# Patient Record
Sex: Female | Born: 1945 | Race: Black or African American | Hispanic: No | Smoking: Never smoker
Health system: Southern US, Community
[De-identification: ages and names within clinical notes are randomized; demographics above are authoritative.]

## PROBLEM LIST (undated history)

## (undated) DIAGNOSIS — T8859XA Other complications of anesthesia, initial encounter: Secondary | ICD-10-CM

## (undated) DIAGNOSIS — T4145XA Adverse effect of unspecified anesthetic, initial encounter: Secondary | ICD-10-CM

## (undated) DIAGNOSIS — M069 Rheumatoid arthritis, unspecified: Secondary | ICD-10-CM

## (undated) DIAGNOSIS — I2699 Other pulmonary embolism without acute cor pulmonale: Secondary | ICD-10-CM

## (undated) DIAGNOSIS — K219 Gastro-esophageal reflux disease without esophagitis: Secondary | ICD-10-CM

## (undated) DIAGNOSIS — M109 Gout, unspecified: Secondary | ICD-10-CM

## (undated) DIAGNOSIS — D649 Anemia, unspecified: Secondary | ICD-10-CM

## (undated) DIAGNOSIS — G894 Chronic pain syndrome: Secondary | ICD-10-CM

## (undated) DIAGNOSIS — M5030 Other cervical disc degeneration, unspecified cervical region: Secondary | ICD-10-CM

## (undated) DIAGNOSIS — F419 Anxiety disorder, unspecified: Secondary | ICD-10-CM

## (undated) DIAGNOSIS — E559 Vitamin D deficiency, unspecified: Secondary | ICD-10-CM

## (undated) DIAGNOSIS — M797 Fibromyalgia: Secondary | ICD-10-CM

## (undated) DIAGNOSIS — N183 Chronic kidney disease, stage 3 unspecified: Secondary | ICD-10-CM

## (undated) DIAGNOSIS — E538 Deficiency of other specified B group vitamins: Secondary | ICD-10-CM

## (undated) DIAGNOSIS — H9190 Unspecified hearing loss, unspecified ear: Secondary | ICD-10-CM

## (undated) DIAGNOSIS — M1990 Unspecified osteoarthritis, unspecified site: Secondary | ICD-10-CM

## (undated) DIAGNOSIS — I10 Essential (primary) hypertension: Secondary | ICD-10-CM

## (undated) HISTORY — PX: KNEE ARTHROSCOPY: SUR90

## (undated) HISTORY — PX: CHOLECYSTECTOMY: SHX55

## (undated) HISTORY — PX: ABDOMINAL HYSTERECTOMY: SUR658

## (undated) HISTORY — PX: ABDOMINAL HYSTERECTOMY: SHX81

## (undated) HISTORY — PX: TONSILLECTOMY: SUR1361

## (undated) HISTORY — PX: TUBAL LIGATION: SHX77

---
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
## 1985-07-29 DIAGNOSIS — Z87442 Personal history of urinary calculi: Secondary | ICD-10-CM

## 1985-07-29 HISTORY — DX: Personal history of urinary calculi: Z87.442

## 1987-07-30 HISTORY — PX: ANKLE FRACTURE SURGERY: SHX122

## 2013-07-29 HISTORY — PX: EYE SURGERY: SHX253

## 2016-01-12 ENCOUNTER — Ambulatory Visit
Admission: EM | Admit: 2016-01-12 | Discharge: 2016-01-12 | Disposition: A | Discharge status: Home / Self Care | Payer: Medicare Other | Attending: Family Medicine | Admitting: Family Medicine

## 2016-01-12 ENCOUNTER — Encounter: Payer: Self-pay | Admitting: *Deleted

## 2016-01-12 DIAGNOSIS — M791 Myalgia, unspecified site: Secondary | ICD-10-CM

## 2016-01-12 DIAGNOSIS — M2550 Pain in unspecified joint: Secondary | ICD-10-CM | POA: Diagnosis not present

## 2016-01-12 HISTORY — DX: Unspecified osteoarthritis, unspecified site: M19.90

## 2016-01-12 HISTORY — DX: Essential (primary) hypertension: I10

## 2016-01-12 MED ORDER — HYDROCODONE-ACETAMINOPHEN 5-325 MG PO TABS: ORAL_TABLET | ORAL | Status: DC

## 2016-01-12 NOTE — ED Notes (Signed)
Pt has hx of Gout, states she is having a flare up and hurts "all over".

## 2016-02-23 NOTE — ED Provider Notes (Signed)
[facility]    CSN: [id number] Arrival date & time: 01/12/16  1849  First Provider Contact:  First MD Initiated Contact with Patient 01/12/16 1946        History   Chief Complaint Chief Complaint  Patient presents with  . Gout    HPI Ahriyah Hartkopf is a 70 y.o. female.   70 yo female with a c/o "hurting all over my muscles and joints". Denies any fevers, chills, falls, injuries, swelling, new medications.    The history is provided by the patient.    Past Medical History:  Diagnosis Date  . Arthritis   . Hypertension     There are no active problems to display for this patient.   Past Surgical History:  Procedure Laterality Date  . ABDOMINAL HYSTERECTOMY    . CESAREAN SECTION    . CHOLECYSTECTOMY    . KNEE ARTHROSCOPY    . TONSILLECTOMY    . TUBAL LIGATION      OB History    No data available       Home Medications    Prior to Admission medications   Medication Sig Start Date End Date Taking? Authorizing Provider  allopurinol (ZYLOPRIM) 300 MG tablet Take 300 mg by mouth daily.   Yes Historical Provider, MD  cholecalciferol (VITAMIN D) 400 units TABS tablet Take 400 Units by mouth.   Yes Historical Provider, MD  estradiol (ESTRACE) 0.5 MG tablet Take 0.5 mg by mouth daily.   Yes Historical Provider, MD  furosemide (LASIX) 10 MG/ML solution Take by mouth daily.   Yes Historical Provider, MD  losartan (COZAAR) 100 MG tablet Take 100 mg by mouth daily.   Yes Historical Provider, MD  methotrexate (RHEUMATREX) 2.5 MG tablet Take 7.5 mg by mouth 3 (three) times a week.   Yes Historical Provider, MD  potassium chloride (KLOR-CON) 20 MEQ packet Take by mouth 2 (two) times daily.   Yes Historical Provider, MD  traMADol (ULTRAM-ER) 100 MG 24 hr tablet Take 100 mg by mouth daily.   Yes Historical Provider, MD  HYDROcodone-acetaminophen (NORCO/VICODIN) 5-325 MG tablet 1-2 tabs po 8 hours prn 01/12/16   Norval Gable, MD    Family History History  reviewed. No pertinent family history.  Social History Social History  Substance Use Topics  . Smoking status: Never Smoker  . Smokeless tobacco: Not on file  . Alcohol use No     Allergies   Penicillins   Review of Systems Review of Systems   Physical Exam Triage Vital Signs ED Triage Vitals  Enc Vitals Group     BP 01/12/16 1903 (!) 130/49     Pulse Rate 01/12/16 1903 84     Resp 01/12/16 1903 16     Temp 01/12/16 1903 98 F (36.7 C)     Temp Source 01/12/16 1903 Oral     SpO2 01/12/16 1903 100 %     Weight 01/12/16 1903 230 lb (104.3 kg)     Height 01/12/16 1903 5\' 5"  (1.651 m)     Head Circumference --      Peak Flow --      Pain Score 01/12/16 2002 5     Pain Loc --      Pain Edu? --      Excl. in [place]? --    No data found.   Updated Vital Signs BP (!) 130/49 (BP Location: Left Arm)   Pulse 84   Temp 98 F (36.7 C) (Oral)  Resp 16   Ht 5\' 5"  (1.651 m)   Wt 230 lb (104.3 kg)   SpO2 100%   BMI 38.27 kg/m   Visual Acuity Right Eye Distance:   Left Eye Distance:   Bilateral Distance:    Right Eye Near:   Left Eye Near:    Bilateral Near:     Physical Exam  Constitutional: She appears well-developed and well-nourished. No distress.  Musculoskeletal: She exhibits no edema or tenderness.  Skin: She is not diaphoretic.  Nursing note and vitals reviewed.    UC Treatments / Results  Labs (all labs ordered are listed, but only abnormal results are displayed) Labs Reviewed - No data to display  EKG  EKG Interpretation None       Radiology No results found.  Procedures Procedures (including critical care time)  Medications Ordered in UC Medications - No data to display   Initial Impression / Assessment and Plan / UC Course  I have reviewed the triage vital signs and the nursing notes.  Pertinent labs & imaging results that were available during my care of the patient were reviewed by me and considered in my medical decision making  (see chart for details).  Clinical Course      Final Clinical Impressions(s) / UC Diagnoses   Final diagnoses:  Myalgia  Arthralgia    New Prescriptions Discharge Medication List as of 01/12/2016  8:00 PM    START taking these medications   Details  HYDROcodone-acetaminophen (NORCO/VICODIN) 5-325 MG tablet 1-2 tabs po 8 hours prn, Print       1.  diagnosis reviewed with patient/parent/guardian/family 2. rx as per orders above; reviewed possible side effects, interactions, risks and benefits  3. Follow-up prn if symptoms worsen or don't improve   Norval Gable, MD 02/23/16 1301

## 2016-02-26 DIAGNOSIS — K589 Irritable bowel syndrome without diarrhea: Secondary | ICD-10-CM | POA: Insufficient documentation

## 2016-02-26 DIAGNOSIS — J45990 Exercise induced bronchospasm: Secondary | ICD-10-CM | POA: Insufficient documentation

## 2016-02-26 DIAGNOSIS — M797 Fibromyalgia: Secondary | ICD-10-CM | POA: Insufficient documentation

## 2016-02-26 DIAGNOSIS — E538 Deficiency of other specified B group vitamins: Secondary | ICD-10-CM | POA: Insufficient documentation

## 2016-02-26 DIAGNOSIS — M069 Rheumatoid arthritis, unspecified: Secondary | ICD-10-CM | POA: Insufficient documentation

## 2016-02-26 DIAGNOSIS — I10 Essential (primary) hypertension: Secondary | ICD-10-CM | POA: Insufficient documentation

## 2016-02-26 DIAGNOSIS — E559 Vitamin D deficiency, unspecified: Secondary | ICD-10-CM | POA: Insufficient documentation

## 2016-02-26 DIAGNOSIS — K219 Gastro-esophageal reflux disease without esophagitis: Secondary | ICD-10-CM | POA: Insufficient documentation

## 2016-02-26 DIAGNOSIS — M1A09X0 Idiopathic chronic gout, multiple sites, without tophus (tophi): Secondary | ICD-10-CM | POA: Insufficient documentation

## 2016-03-06 DIAGNOSIS — Z79899 Other long term (current) drug therapy: Secondary | ICD-10-CM | POA: Insufficient documentation

## 2016-03-06 DIAGNOSIS — Z79891 Long term (current) use of opiate analgesic: Secondary | ICD-10-CM | POA: Insufficient documentation

## 2016-05-17 ENCOUNTER — Ambulatory Visit
Admission: EM | Admit: 2016-05-17 | Discharge: 2016-05-17 | Disposition: A | Discharge status: Home / Self Care | Payer: Medicare Other | Attending: Family Medicine | Admitting: Family Medicine

## 2016-05-17 ENCOUNTER — Encounter: Payer: Self-pay | Admitting: Emergency Medicine

## 2016-05-17 DIAGNOSIS — M129 Arthropathy, unspecified: Secondary | ICD-10-CM

## 2016-05-17 DIAGNOSIS — M25511 Pain in right shoulder: Secondary | ICD-10-CM

## 2016-05-17 MED ORDER — PREDNISONE 20 MG PO TABS: ORAL_TABLET | ORAL | 0 refills | Status: DC

## 2016-05-17 NOTE — ED Provider Notes (Signed)
CSN: [id number]     Arrival date & time 05/17/16  1818 History   First MD Initiated Contact with Patient 05/17/16 1939     Chief Complaint  Patient presents with  . Arm Pain    right   (Consider location/radiation/quality/duration/timing/severity/associated sxs/prior Treatment) HPI  This a 70 year old female who presents with planes of right shoulder pain with any motion away from her side and aching "all over". The patient has just returned from a long trip to [facility] in [place] head or she walked excessively. He definitely overdid her usual activities. Now she has nonspecific pain in her joints. She been in bed all day long tried tramadol and a Vicodin without any relief. She has a history of rheumatoid arthritis and is currently taking methotrexate milligrams IM weekly. In addition she has a history of gouty arthritis and has been taking allopurinol.      Past Medical History:  Diagnosis Date  . Arthritis   . Hypertension    Past Surgical History:  Procedure Laterality Date  . ABDOMINAL HYSTERECTOMY    . CESAREAN SECTION    . CHOLECYSTECTOMY    . KNEE ARTHROSCOPY    . TONSILLECTOMY    . TUBAL LIGATION     History reviewed. No pertinent family history. Social History  Substance Use Topics  . Smoking status: Never Smoker  . Smokeless tobacco: Never Used  . Alcohol use No   OB History    No data available     Review of Systems  Constitutional: Positive for activity change. Negative for chills, fatigue and fever.  Musculoskeletal: Positive for arthralgias and myalgias.  All other systems reviewed and are negative.   Allergies  Penicillins  Home Medications   Prior to Admission medications   Medication Sig Start Date End Date Taking? Authorizing Provider  allopurinol (ZYLOPRIM) 300 MG tablet Take 300 mg by mouth daily.    Historical Provider, MD  cholecalciferol (VITAMIN D) 400 units TABS tablet Take 400 Units by mouth.    Historical Provider, MD  estradiol  (ESTRACE) 0.5 MG tablet Take 0.5 mg by mouth daily.    Historical Provider, MD  furosemide (LASIX) 10 MG/ML solution Take by mouth daily.    Historical Provider, MD  losartan (COZAAR) 100 MG tablet Take 100 mg by mouth daily.    Historical Provider, MD  methotrexate (RHEUMATREX) 2.5 MG tablet Take 7.5 mg by mouth 3 (three) times a week.    Historical Provider, MD  potassium chloride (KLOR-CON) 20 MEQ packet Take by mouth 2 (two) times daily.    Historical Provider, MD  predniSONE (DELTASONE) 20 MG tablet Take 1 tablet daily for 3 days 05/17/16   Lorin Picket, PA-C  traMADol (ULTRAM-ER) 100 MG 24 hr tablet Take 100 mg by mouth daily.    Historical Provider, MD   Meds Ordered and Administered this Visit  Medications - No data to display  BP (!) 143/63 (BP Location: Right Arm)   Pulse 78   Temp 97.4 F (36.3 C) (Tympanic)   Resp 16   Ht 5\' 5"  (1.651 m)   Wt 232 lb (105.2 kg)   SpO2 100%   BMI 38.61 kg/m  No data found.   Physical Exam  Constitutional: She is oriented to person, place, and time. She appears well-developed and well-nourished. No distress.  HENT:  Head: Normocephalic and atraumatic.  Eyes: EOM are normal. Pupils are equal, round, and reactive to light.  Neck: Normal range of motion. Neck supple.  Musculoskeletal:  She exhibits tenderness.  Examination of the right shoulder shows good passive range of motion lacking approximately 10-15 of full duction to 90. He has good internal and external rotation. There is no evidence of any gout present in her joints.  Neurological: She is alert and oriented to person, place, and time.  Skin: Skin is warm and dry. She is not diaphoretic.  Psychiatric: She has a normal mood and affect. Her behavior is normal. Judgment and thought content normal.  Nursing note and vitals reviewed.   Urgent Care Course   Clinical Course    Procedures (including critical care time)  Labs Review Labs Reviewed - No data to  display  Imaging Review No results found.   Visual Acuity Review  Right Eye Distance:   Left Eye Distance:   Bilateral Distance:    Right Eye Near:   Left Eye Near:    Bilateral Near:         MDM   1. Arthritis involving multiple sites   2. Acute pain of right shoulder    Discharge Medication List as of 05/17/2016  8:03 PM    START taking these medications   Details  predniSONE (DELTASONE) 20 MG tablet Take 1 tablet daily for 3 days, Normal      Plan: 1. Test/x-ray results and diagnosis reviewed with patient 2. rx as per orders; risks, benefits, potential side effects reviewed with patient 3. Recommend supportive treatment with Rest and symptom avoidance. She asked for a cortisone shot in the shoulder and I told her that we do not perform that in this facility. Need to follow-up with her rheumatologist and I recommended that she tried to see the rheumatologist earlier than November 9 appointment that she has. Do not want to prescribe any nonsteroidal anti-inflammatory medications because she is on methotrexate but I will prescribe a very limited amount of low dose prednisone to see if this will help her. I have told her she must rely mostly on rest and avoidance of symptoms. She did far too much on her vacation. She also tells me that she [place] and I have warned her not to do walking and would prefer that she go in a wheelchair and have her daughter pushed her around. 4. F/u prn if symptoms worsen or don't improve     Lorin Picket, PA-C 05/17/16 2017

## 2016-05-17 NOTE — ED Triage Notes (Signed)
Patient c/o pain and weakness in her right arm and pain all over body that started this week.

## 2016-05-21 ENCOUNTER — Telehealth: Payer: Self-pay

## 2016-05-21 NOTE — Telephone Encounter (Signed)
Courtesy call back completed today for patient's recent visit at [facility]. Patient did not answer, left message on machine to call back with any questions or concerns.   

## 2016-06-06 DIAGNOSIS — M7551 Bursitis of right shoulder: Secondary | ICD-10-CM | POA: Insufficient documentation

## 2016-08-06 ENCOUNTER — Ambulatory Visit: Payer: BLUE CROSS/BLUE SHIELD | Admitting: Pain Medicine

## 2016-09-13 ENCOUNTER — Other Ambulatory Visit: Payer: Self-pay | Admitting: Family Medicine

## 2016-09-13 DIAGNOSIS — Z1239 Encounter for other screening for malignant neoplasm of breast: Secondary | ICD-10-CM

## 2016-10-13 ENCOUNTER — Ambulatory Visit
Admission: EM | Admit: 2016-10-13 | Discharge: 2016-10-13 | Disposition: A | Discharge status: Home / Self Care | Payer: Medicare Other | Attending: Family Medicine | Admitting: Family Medicine

## 2016-10-13 DIAGNOSIS — J209 Acute bronchitis, unspecified: Secondary | ICD-10-CM | POA: Diagnosis not present

## 2016-10-13 MED ORDER — HYDROCOD POLST-CPM POLST ER 10-8 MG/5ML PO SUER: 5.0000 mL | Freq: Two times a day (BID) | ORAL | 0 refills | Status: DC | PRN

## 2016-10-13 MED ORDER — PREDNISONE 50 MG PO TABS: ORAL_TABLET | ORAL | 0 refills | Status: DC

## 2016-10-13 NOTE — ED Triage Notes (Signed)
Pt with 2 weeks of nasal drainage and cough, chest hurts with coughing, and head pain. Pain 4/10.

## 2016-10-13 NOTE — Discharge Instructions (Signed)
This is likely viral. No antibiotics needed.  Prednisone will help as well as cough medication.  Take care  Dr. Lacinda Axon

## 2016-10-13 NOTE — ED Provider Notes (Signed)
[facility]    CSN: [id number] Arrival date & time: 10/13/16  1047   History   Chief Complaint Chief Complaint  Patient presents with  . Nasal Congestion  . Cough   HPI  71 year old female presents with the above complaints.  Patient states she's been sick for 2 weeks. She's had sinus pressure and headache. She said sinus drainage and cough. She is particularly bothered by her cough currently. Cardizem productive of thick sputum. No purulence. She had a fever last week but has had none recently. No other associated symptoms. No known exacerbating or relieving factors. No other complaints or concerns at this time.  Past Medical History:  Diagnosis Date  . Arthritis   . Hypertension    There are no active problems to display for this patient.  Past Surgical History:  Procedure Laterality Date  . ABDOMINAL HYSTERECTOMY    . CESAREAN SECTION    . CHOLECYSTECTOMY    . KNEE ARTHROSCOPY    . TONSILLECTOMY    . TUBAL LIGATION     OB History    No data available     Home Medications    Prior to Admission medications   Medication Sig Start Date End Date Taking? Authorizing Provider  gabapentin (NEURONTIN) 300 MG capsule Take 300 mg by mouth 3 (three) times daily.   Yes Historical Provider, MD  allopurinol (ZYLOPRIM) 300 MG tablet Take 300 mg by mouth daily.    Historical Provider, MD  chlorpheniramine-HYDROcodone (TUSSIONEX PENNKINETIC ER) 10-8 MG/5ML SUER Take 5 mLs by mouth every 12 (twelve) hours as needed. 10/13/16   Coral Spikes, DO  cholecalciferol (VITAMIN D) 400 units TABS tablet Take 400 Units by mouth.    Historical Provider, MD  estradiol (ESTRACE) 0.5 MG tablet Take 0.5 mg by mouth daily.    Historical Provider, MD  furosemide (LASIX) 10 MG/ML solution Take by mouth daily.    Historical Provider, MD  losartan (COZAAR) 100 MG tablet Take 100 mg by mouth daily.    Historical Provider, MD  methotrexate (RHEUMATREX) 2.5 MG tablet Take 7.5 mg by mouth 3  (three) times a week.    Historical Provider, MD  potassium chloride (KLOR-CON) 20 MEQ packet Take by mouth 2 (two) times daily.    Historical Provider, MD  predniSONE (DELTASONE) 50 MG tablet 1 tablet daily x 5 days. 10/13/16   Coral Spikes, DO  traMADol (ULTRAM-ER) 100 MG 24 hr tablet Take 100 mg by mouth daily.    Historical Provider, MD    Family History History reviewed. No pertinent family history.  Social History Social History  Substance Use Topics  . Smoking status: Never Smoker  . Smokeless tobacco: Never Used  . Alcohol use No   Allergies   Penicillins   Review of Systems Review of Systems  Constitutional: Positive for fever.  HENT: Positive for postnasal drip and sinus pressure.   Respiratory: Positive for cough.    Physical Exam Triage Vital Signs ED Triage Vitals  Enc Vitals Group     BP 10/13/16 1115 (!) 134/57     Pulse Rate 10/13/16 1115 77     Resp 10/13/16 1115 20     Temp 10/13/16 1115 98.7 F (37.1 C)     Temp Source 10/13/16 1115 Oral     SpO2 10/13/16 1115 100 %     Weight 10/13/16 1115 237 lb (107.5 kg)     Height 10/13/16 1115 5\' 4"  (1.626 m)  Head Circumference --      Peak Flow --      Pain Score 10/13/16 1117 4     Pain Loc --      Pain Edu? --      Excl. in [place]? --    No data found.   Updated Vital Signs BP (!) 134/57 (BP Location: Left Arm)   Pulse 77   Temp 98.7 F (37.1 C) (Oral)   Resp 20   Ht 5\' 4"  (1.626 m)   Wt 237 lb (107.5 kg)   SpO2 100%   BMI 40.68 kg/m   Physical Exam  Constitutional: She is oriented to person, place, and time. She appears well-developed. No distress.  HENT:  Head: Normocephalic and atraumatic.  Mouth/Throat: Oropharynx is clear and moist.  Mild maxillary sinus tenderness to palpation.  Neck: Neck supple.  Cardiovascular: Normal rate and regular rhythm.   Pulmonary/Chest: Effort normal and breath sounds normal.  Lymphadenopathy:    She has no cervical adenopathy.  Neurological: She is  alert and oriented to person, place, and time.  Psychiatric: She has a normal mood and affect.  Vitals reviewed.  UC Treatments / Results  Labs (all labs ordered are listed, but only abnormal results are displayed) Labs Reviewed - No data to display  EKG  EKG Interpretation None       Radiology No results found.  Procedures Procedures (including critical care time)  Medications Ordered in UC Medications - No data to display   Initial Impression / Assessment and Plan / UC Course  I have reviewed the triage vital signs and the nursing notes.  Pertinent labs & imaging results that were available during my care of the patient were reviewed by me and considered in my medical decision making (see chart for details).   71 year old female presents with clinical picture consistent with viral bronchitis. Treating with prednisone and Tussionex.  Final Clinical Impressions(s) / UC Diagnoses   Final diagnoses:  Acute bronchitis, unspecified organism    New Prescriptions Discharge Medication List as of 10/13/2016 12:28 PM    START taking these medications   Details  chlorpheniramine-HYDROcodone (TUSSIONEX PENNKINETIC ER) 10-8 MG/5ML SUER Take 5 mLs by mouth every 12 (twelve) hours as needed., Starting Sun 10/13/2016, [place], DO 10/13/16 1240

## 2016-10-14 ENCOUNTER — Ambulatory Visit
Admission: RE | Admit: 2016-10-14 | Discharge: 2016-10-14 | Disposition: A | Discharge status: Home / Self Care | Payer: Medicare Other | Source: Ambulatory Visit | Attending: Family Medicine | Admitting: Family Medicine

## 2016-10-14 DIAGNOSIS — Z1231 Encounter for screening mammogram for malignant neoplasm of breast: Secondary | ICD-10-CM | POA: Insufficient documentation

## 2016-10-14 DIAGNOSIS — Z1239 Encounter for other screening for malignant neoplasm of breast: Secondary | ICD-10-CM

## 2016-10-22 ENCOUNTER — Inpatient Hospital Stay
Admission: RE | Admit: 2016-10-22 | Discharge: 2016-10-22 | Disposition: A | Discharge status: Home / Self Care | Payer: Self-pay | Source: Ambulatory Visit | Attending: *Deleted | Admitting: *Deleted

## 2016-10-22 ENCOUNTER — Other Ambulatory Visit: Payer: Self-pay | Admitting: *Deleted

## 2016-10-22 DIAGNOSIS — Z9289 Personal history of other medical treatment: Secondary | ICD-10-CM

## 2016-12-26 DIAGNOSIS — M1712 Unilateral primary osteoarthritis, left knee: Secondary | ICD-10-CM | POA: Insufficient documentation

## 2017-02-12 DIAGNOSIS — R700 Elevated erythrocyte sedimentation rate: Secondary | ICD-10-CM | POA: Insufficient documentation

## 2017-03-24 DIAGNOSIS — Z6841 Body Mass Index (BMI) 40.0 and over, adult: Secondary | ICD-10-CM | POA: Insufficient documentation

## 2017-03-28 DIAGNOSIS — N183 Chronic kidney disease, stage 3 unspecified: Secondary | ICD-10-CM | POA: Insufficient documentation

## 2017-04-07 ENCOUNTER — Encounter: Payer: Self-pay | Admitting: *Deleted

## 2017-04-07 ENCOUNTER — Ambulatory Visit
Admission: EM | Admit: 2017-04-07 | Discharge: 2017-04-07 | Disposition: A | Discharge status: Home / Self Care | Payer: Medicare Other | Attending: Family Medicine | Admitting: Family Medicine

## 2017-04-07 DIAGNOSIS — K120 Recurrent oral aphthae: Secondary | ICD-10-CM

## 2017-04-07 MED ORDER — LIDOCAINE VISCOUS 2 % MT SOLN: 20.0000 mL | OROMUCOSAL | 0 refills | Status: DC | PRN

## 2017-04-07 NOTE — Discharge Instructions (Signed)
Use regular mouthwash (ACT, Peroxyl) two or three times daily

## 2017-04-07 NOTE — ED Provider Notes (Signed)
[facility]    CSN: [id number] Arrival date & time: 04/07/17  1609     History   Chief Complaint Chief Complaint  Patient presents with  . Mouth Lesions    HPI Sabrina Green is a 71 y.o. female.   71 yo female with a c/o painful sore in her mouth for the last 3 days. Denies any injuries, fevers, chills.    The history is provided by the patient.  Mouth Lesions    Past Medical History:  Diagnosis Date  . Arthritis   . Hypertension     There are no active problems to display for this patient.   Past Surgical History:  Procedure Laterality Date  . ABDOMINAL HYSTERECTOMY    . ABDOMINAL HYSTERECTOMY    . CESAREAN SECTION    . CHOLECYSTECTOMY    . KNEE ARTHROSCOPY    . TONSILLECTOMY    . TUBAL LIGATION      OB History    No data available       Home Medications    Prior to Admission medications   Medication Sig Start Date End Date Taking? Authorizing Provider  allopurinol (ZYLOPRIM) 300 MG tablet Take 300 mg by mouth daily.   Yes [provider]  cholecalciferol (VITAMIN D) 400 units TABS tablet Take 400 Units by mouth.   Yes [provider]  estradiol (ESTRACE) 0.5 MG tablet Take 0.5 mg by mouth daily.   Yes [provider]  furosemide (LASIX) 10 MG/ML solution Take by mouth daily.   Yes [provider]  gabapentin (NEURONTIN) 300 MG capsule Take 300 mg by mouth 3 (three) times daily.   Yes [provider]  losartan (COZAAR) 100 MG tablet Take 100 mg by mouth daily.   Yes [provider]  methotrexate (RHEUMATREX) 2.5 MG tablet Take 7.5 mg by mouth 3 (three) times a week.   Yes [provider]  potassium chloride (KLOR-CON) 20 MEQ packet Take by mouth 2 (two) times daily.   Yes [provider]  chlorpheniramine-HYDROcodone (TUSSIONEX PENNKINETIC ER) 10-8 MG/5ML SUER Take 5 mLs by mouth every 12 (twelve) hours as needed. 10/13/16   Coral Spikes, DO  lidocaine (XYLOCAINE) 2  % solution Use as directed 20 mLs in the mouth or throat as needed for mouth pain. 04/07/17   Norval Gable, MD  predniSONE (DELTASONE) 50 MG tablet 1 tablet daily x 5 days. 10/13/16   Coral Spikes, DO  traMADol (ULTRAM-ER) 100 MG 24 hr tablet Take 100 mg by mouth daily.    [provider]    Family History Family History  Problem Relation Age of Onset  . Breast cancer Other 93    Social History Social History  Substance Use Topics  . Smoking status: Never Smoker  . Smokeless tobacco: Never Used  . Alcohol use No     Allergies   Penicillins   Review of Systems Review of Systems  HENT: Positive for mouth sores.      Physical Exam Triage Vital Signs ED Triage Vitals  Enc Vitals Group     BP 04/07/17 1650 (!) 132/55     Pulse Rate 04/07/17 1650 64     Resp 04/07/17 1650 16     Temp 04/07/17 1650 98.6 F (37 C)     Temp Source 04/07/17 1650 Oral     SpO2 04/07/17 1650 100 %     Weight 04/07/17 1652 248 lb (112.5 kg)     Height  04/07/17 1652 5\' 5"  (1.651 m)     Head Circumference --      Peak Flow --      Pain Score --      Pain Loc --      Pain Edu? --      Excl. in [place]? --    No data found.   Updated Vital Signs BP (!) 132/55 (BP Location: Left Arm)   Pulse 64   Temp 98.6 F (37 C) (Oral)   Resp 16   Ht 5\' 5"  (1.651 m)   Wt 248 lb (112.5 kg)   SpO2 100%   BMI 41.27 kg/m   Visual Acuity Right Eye Distance:   Left Eye Distance:   Bilateral Distance:    Right Eye Near:   Left Eye Near:    Bilateral Near:     Physical Exam  Constitutional: She appears well-developed and well-nourished. No distress.  HENT:  Mouth/Throat: Oral lesions (2-40mm ulcerative lesions on inner cheek, lips area) present.  Skin: She is not diaphoretic.  Nursing note and vitals reviewed.    UC Treatments / Results  Labs (all labs ordered are listed, but only abnormal results are displayed) Labs Reviewed - No data to display  EKG  EKG Interpretation None         Radiology No results found.  Procedures Procedures (including critical care time)  Medications Ordered in UC Medications - No data to display   Initial Impression / Assessment and Plan / UC Course  I have reviewed the triage vital signs and the nursing notes.  Pertinent labs & imaging results that were available during my care of the patient were reviewed by me and considered in my medical decision making (see chart for details).      Final Clinical Impressions(s) / UC Diagnoses   Final diagnoses:  Aphthous ulcer    New Prescriptions Discharge Medication List as of 04/07/2017  5:39 PM    START taking these medications   Details  lidocaine (XYLOCAINE) 2 % solution Use as directed 20 mLs in the mouth or throat as needed for mouth pain., Starting Mon 04/07/2017, Normal       1. diagnosis reviewed with patient 2. rx as per orders above; reviewed possible side effects, interactions, risks and benefits  3. Recommend supportive treatment with mouthwash rinses  4. Follow-up prn if symptoms worsen or don't improve  Controlled Substance Prescriptions [place] Controlled Substance Registry consulted? Not Applicable   Norval Gable, MD 04/07/17 [phone]

## 2017-04-07 NOTE — ED Triage Notes (Signed)
Oral lesions, onset Saturday.

## 2017-04-22 DIAGNOSIS — M545 Low back pain, unspecified: Secondary | ICD-10-CM | POA: Insufficient documentation

## 2017-04-22 DIAGNOSIS — G8929 Other chronic pain: Secondary | ICD-10-CM | POA: Insufficient documentation

## 2017-06-02 DIAGNOSIS — M5136 Other intervertebral disc degeneration, lumbar region: Secondary | ICD-10-CM | POA: Insufficient documentation

## 2017-09-07 ENCOUNTER — Emergency Department: Payer: Medicare Other

## 2017-09-07 ENCOUNTER — Emergency Department
Admission: EM | Admit: 2017-09-07 | Discharge: 2017-09-07 | Disposition: A | Discharge status: Home / Self Care | Payer: Medicare Other | Attending: Emergency Medicine | Admitting: Emergency Medicine

## 2017-09-07 ENCOUNTER — Encounter: Payer: Self-pay | Admitting: Emergency Medicine

## 2017-09-07 DIAGNOSIS — Y999 Unspecified external cause status: Secondary | ICD-10-CM | POA: Diagnosis not present

## 2017-09-07 DIAGNOSIS — Y929 Unspecified place or not applicable: Secondary | ICD-10-CM | POA: Diagnosis not present

## 2017-09-07 DIAGNOSIS — I10 Essential (primary) hypertension: Secondary | ICD-10-CM | POA: Diagnosis not present

## 2017-09-07 DIAGNOSIS — Y9301 Activity, walking, marching and hiking: Secondary | ICD-10-CM | POA: Insufficient documentation

## 2017-09-07 DIAGNOSIS — J111 Influenza due to unidentified influenza virus with other respiratory manifestations: Secondary | ICD-10-CM

## 2017-09-07 DIAGNOSIS — R42 Dizziness and giddiness: Secondary | ICD-10-CM | POA: Diagnosis present

## 2017-09-07 DIAGNOSIS — Z79899 Other long term (current) drug therapy: Secondary | ICD-10-CM | POA: Diagnosis not present

## 2017-09-07 DIAGNOSIS — W109XXA Fall (on) (from) unspecified stairs and steps, initial encounter: Secondary | ICD-10-CM | POA: Insufficient documentation

## 2017-09-07 LAB — CBC WITH DIFFERENTIAL/PLATELET
Basophils Absolute: 0 10*3/uL (ref 0–0.1)
Basophils Relative: 0 %
EOS ABS: 0 10*3/uL (ref 0–0.7)
EOS PCT: 1 %
HCT: 36.7 % (ref 35.0–47.0)
Hemoglobin: 12.2 g/dL (ref 12.0–16.0)
LYMPHS ABS: 1 10*3/uL (ref 1.0–3.6)
Lymphocytes Relative: 22 %
MCH: 30.2 pg (ref 26.0–34.0)
MCHC: 33.1 g/dL (ref 32.0–36.0)
MCV: 91.2 fL (ref 80.0–100.0)
MONOS PCT: 10 %
Monocytes Absolute: 0.5 10*3/uL (ref 0.2–0.9)
Neutro Abs: 3 10*3/uL (ref 1.4–6.5)
Neutrophils Relative %: 67 %
PLATELETS: 132 10*3/uL — AB (ref 150–440)
RBC: 4.02 MIL/uL (ref 3.80–5.20)
RDW: 16.7 % — ABNORMAL HIGH (ref 11.5–14.5)
WBC: 4.5 10*3/uL (ref 3.6–11.0)

## 2017-09-07 LAB — URINALYSIS, COMPLETE (UACMP) WITH MICROSCOPIC
Bilirubin Urine: NEGATIVE
Glucose, UA: NEGATIVE mg/dL
HGB URINE DIPSTICK: NEGATIVE
KETONES UR: 5 mg/dL — AB
LEUKOCYTES UA: NEGATIVE
Nitrite: NEGATIVE
Protein, ur: NEGATIVE mg/dL
SPECIFIC GRAVITY, URINE: 1.024 (ref 1.005–1.030)
pH: 5 (ref 5.0–8.0)

## 2017-09-07 LAB — BASIC METABOLIC PANEL
Anion gap: 12 (ref 5–15)
BUN: 19 mg/dL (ref 6–20)
CHLORIDE: 104 mmol/L (ref 101–111)
CO2: 24 mmol/L (ref 22–32)
CREATININE: 1.28 mg/dL — AB (ref 0.44–1.00)
Calcium: 9.2 mg/dL (ref 8.9–10.3)
GFR calc Af Amer: 48 mL/min — ABNORMAL LOW (ref >60)
GFR calc non Af Amer: 42 mL/min — ABNORMAL LOW (ref >60)
Glucose, Bld: 81 mg/dL (ref 65–99)
Potassium: 4.4 mmol/L (ref 3.5–5.1)
SODIUM: 140 mmol/L (ref 135–145)

## 2017-09-07 LAB — INFLUENZA PANEL BY PCR (TYPE A & B)
INFLBPCR: NEGATIVE
Influenza A By PCR: POSITIVE — AB

## 2017-09-07 LAB — TROPONIN I

## 2017-09-07 MED ORDER — SODIUM CHLORIDE 0.9 % IV BOLUS (SEPSIS)
500.0000 mL | Freq: Once | INTRAVENOUS | Status: AC
  Administered 2017-09-07: 500 mL via INTRAVENOUS

## 2017-09-07 NOTE — Discharge Instructions (Signed)
Please seek medical attention for any high fevers, chest pain, shortness of breath, change in behavior, persistent vomiting, bloody stool or any other new or concerning symptoms.  

## 2017-09-07 NOTE — ED Notes (Signed)
Daughter is upset saying pt also came in for cough and hasn't been seen for that. Pt, daughter and grandchild have all had the same symptoms of cough, congestion. Pt has been taking allerest? But still coughing. Explained allerest is not for cough, but treats cold symptoms. vss

## 2017-09-07 NOTE — ED Provider Notes (Signed)
[facility] Emergency Department Provider Note  ____________________________________________   I have reviewed the triage vital signs and the nursing notes. Where available I have reviewed prior notes and, if possible and indicated, outside hospital notes.    HISTORY  Chief Complaint Fall    HPI Sabrina Green is a 72 y.o. female history of arthritis and hypertension, she states that she has had a runny nose and cough for the last week, also slight fevers, multiple sick family is present have the same, she denies any headaches dysuria productive cough.  States that she was working for her daughter's birthday party all day yesterday did not have anything really to eat or drink, had not much to eat or drink today either because she "does not feel like it" yesterday while going downstairs she became slightly lightheaded, and fell.  She remembers falling.  She states that she had no chest pain or shortness of breath, she states she did hit her head, it is thought by the family she may have been unconscious for "a brief moment", she does remember falling.  She has not fallen since that time she denies any injury aside from bumping her head.  She is interested in having further workup of her URI symptoms.  Her fall happened yesterday.    Past Medical History:  Diagnosis Date  . Arthritis   . Hypertension     There are no active problems to display for this patient.   Past Surgical History:  Procedure Laterality Date  . ABDOMINAL HYSTERECTOMY    . ABDOMINAL HYSTERECTOMY    . CESAREAN SECTION    . CHOLECYSTECTOMY    . KNEE ARTHROSCOPY    . TONSILLECTOMY    . TUBAL LIGATION      Prior to Admission medications   Medication Sig Start Date End Date Taking? Authorizing Provider  allopurinol (ZYLOPRIM) 300 MG tablet Take 300 mg by mouth daily.    [provider]  chlorpheniramine-HYDROcodone (TUSSIONEX PENNKINETIC ER) 10-8 MG/5ML SUER Take 5 mLs by mouth  every 12 (twelve) hours as needed. Patient not taking: Reported on 09/07/2017 10/13/16   Coral Spikes, DO  cholecalciferol (VITAMIN D) 400 units TABS tablet Take 400 Units by mouth.    [provider]  estradiol (ESTRACE) 0.5 MG tablet Take 0.5 mg by mouth daily.    [provider]  furosemide (LASIX) 10 MG/ML solution Take by mouth daily.    [provider]  gabapentin (NEURONTIN) 300 MG capsule Take 300 mg by mouth 3 (three) times daily.    [provider]  lidocaine (XYLOCAINE) 2 % solution Use as directed 20 mLs in the mouth or throat as needed for mouth pain. 04/07/17   Norval Gable, MD  losartan (COZAAR) 100 MG tablet Take 100 mg by mouth daily.    [provider]  methotrexate (RHEUMATREX) 2.5 MG tablet Take 7.5 mg by mouth 3 (three) times a week.    [provider]  potassium chloride (KLOR-CON) 20 MEQ packet Take by mouth 2 (two) times daily.    [provider]  predniSONE (DELTASONE) 50 MG tablet 1 tablet daily x 5 days. 10/13/16   Coral Spikes, DO  traMADol (ULTRAM-ER) 100 MG 24 hr tablet Take 100 mg by mouth daily.    [provider]    Allergies Penicillins  Family History  Problem Relation Age of Onset  . Breast cancer Other 76    Social History Social History   Tobacco  Use  . Smoking status: Never Smoker  . Smokeless tobacco: Never Used  Substance Use Topics  . Alcohol use: No  . Drug use: No    Review of Systems Constitutional: No high fever/chills Eyes: No visual changes. ENT: No sore throat. No stiff neck no neck pain Cardiovascular: Denies chest pain. Respiratory: Positive cough  Gastrointestinal:   no vomiting.  No diarrhea.  No constipation. Genitourinary: Negative for dysuria. Musculoskeletal: Negative lower extremity swelling Skin: Negative for rash. Neurological: Negative for severe headaches, focal weakness or  numbness.   ____________________________________________   PHYSICAL EXAM:  VITAL SIGNS: ED Triage Vitals  Enc Vitals Group     BP 09/07/17 1229 131/61     Pulse Rate 09/07/17 1229 73     Resp --      Temp 09/07/17 1229 99.7 F (37.6 C)     Temp Source 09/07/17 1229 Oral     SpO2 09/07/17 1229 100 %     Weight 09/07/17 1234 250 lb (113.4 kg)     Height 09/07/17 1234 5\' 5"  (1.651 m)     Head Circumference --      Peak Flow --      Pain Score 09/07/17 1234 4     Pain Loc --      Pain Edu? --      Excl. in [place]? --     Constitutional: Alert and oriented. Well appearing and in no acute distress. Eyes: Conjunctivae are normal Head: Atraumatic HEENT: Positive mild clear congestion/rhinnorhea. Mucous membranes are moist.  Oropharynx non-erythematous Neck:   Nontender with no meningismus, no masses, no stridor Cardiovascular: Normal rate, regular rhythm. Grossly normal heart sounds.  Good peripheral circulation. Respiratory: Normal respiratory effort.  No retractions. Lungs CTAB.  No cough appreciated Abdominal: Soft and nontender. No distention. No guarding no rebound Back:  There is no focal tenderness or step off.  there is no midline tenderness there are no lesions noted. there is no CVA tenderness Musculoskeletal: No lower extremity tenderness, no upper extremity tenderness. No joint effusions, no DVT signs strong distal pulses no edema Neurologic:  Normal speech and language. No gross focal neurologic deficits are appreciated.  Skin:  Skin is warm, dry and intact. No rash noted. Psychiatric: Mood and affect are normal. Speech and behavior are normal.  ____________________________________________   LABS (all labs ordered are listed, but only abnormal results are displayed)  Labs Reviewed  URINALYSIS, COMPLETE (UACMP) WITH MICROSCOPIC  BASIC METABOLIC PANEL  CBC WITH DIFFERENTIAL/PLATELET  TROPONIN I  INFLUENZA PANEL BY PCR (TYPE A & B)    Pertinent labs  results that  were available during my care of the patient were reviewed by me and considered in my medical decision making (see chart for details). ____________________________________________  EKG  I personally interpreted any EKGs ordered by me or triage  ____________________________________________  RADIOLOGY  Pertinent labs & imaging results that were available during my care of the patient were reviewed by me and considered in my medical decision making (see chart for details). If possible, patient and/or family made aware of any abnormal findings.  Dg Chest 2 View  Result Date: 09/07/2017 CLINICAL DATA:  Cough. EXAM: CHEST  2 VIEW COMPARISON:  None. FINDINGS: The cardiomediastinal silhouette is unremarkable. There is no evidence of focal airspace disease, pulmonary edema, suspicious pulmonary nodule/mass, pleural effusion, or pneumothorax. No acute bony abnormalities are identified. IMPRESSION: No active cardiopulmonary disease. Electronically Signed   By: Margarette Canada M.D.   On: 09/07/2017  14:18   Ct Head Wo Contrast  Result Date: 09/07/2017 CLINICAL DATA:  Fall down stairs last night with head injury and loss of consciousness. Dizziness. EXAM: CT HEAD WITHOUT CONTRAST TECHNIQUE: Contiguous axial images were obtained from the base of the skull through the vertex without intravenous contrast. COMPARISON:  None. FINDINGS: Brain: No evidence of acute infarction, hemorrhage, hydrocephalus, extra-axial collection or mass lesion/mass effect. Vascular: No hyperdense vessel or unexpected calcification. Skull: Normal. Negative for fracture or focal lesion. Sinuses/Orbits: No acute finding. Other: None. IMPRESSION: Normal head CT. Electronically Signed   By: Aletta Edouard M.D.   On: 09/07/2017 13:01   ____________________________________________    PROCEDURES  Procedure(s) performed: None  Procedures  Critical Care performed: None  ____________________________________________   INITIAL IMPRESSION  / ASSESSMENT AND PLAN / ED COURSE  Pertinent labs & imaging results that were available during my care of the patient were reviewed by me and considered in my medical decision making (see chart for details).  Patient here with a closed head injury, no evidence of significant concussion no evidence of intracranial bleed CT scan is negative happened over 24 hours ago.  Is also been feeling weak and lightheaded with a cough URI symptoms.  We will obtain EKG, basic blood work, troponin and give her IV fluid and reassess.  Sounds like she is overdoing it really with a URI.  Chest x-ray shows no pneumonia.  We will also check influenza.  Blood work and vitals and exam are reassuring. Signed out to dr. Archie Balboa at the end of my shift.    ____________________________________________   FINAL CLINICAL IMPRESSION(S) / ED DIAGNOSES  Final diagnoses:  None      This chart was dictated using voice recognition software.  Despite best efforts to proofread,  errors can occur which can change meaning.      Schuyler Amor, MD 09/07/17 [phone]

## 2017-09-07 NOTE — ED Provider Notes (Signed)
-----------------------------------------   5:30 PM on 09/07/2017 -----------------------------------------  To over care from Dr. Burlene Arnt, patient flu positive without any other concerning findings. Had a discussion with the patient about diagnoses of flu and tamiflu. At this point patient felt comfortable deferring tamiflu which I feel is reasonable given length of symptoms. Discussed expected course of flu with patient.    Nance Pear, MD 09/07/17 [phone]

## 2017-09-07 NOTE — ED Notes (Signed)
Pt states feels much better after fluids. Offered w/c and decline stating she wanted to walk

## 2017-09-07 NOTE — ED Notes (Signed)
Warm pack placed on the rt hand placed and covered with towel per pt's request for possible iv start

## 2017-09-07 NOTE — ED Triage Notes (Signed)
Pt arrived with daughter with complaints of injury after falling last night. Pt reports dizziness caused her to fall down 5 stairs and hit he head. Pt states she lost consciousness. Pt reports "seeing spots" yesterday and today complains of localized pain at the back of her head.

## 2017-09-24 ENCOUNTER — Other Ambulatory Visit: Payer: Self-pay | Admitting: Family Medicine

## 2017-09-24 DIAGNOSIS — Z1231 Encounter for screening mammogram for malignant neoplasm of breast: Secondary | ICD-10-CM

## 2017-09-29 ENCOUNTER — Ambulatory Visit: Payer: Medicare Other

## 2017-09-29 ENCOUNTER — Encounter: Payer: Self-pay | Admitting: *Deleted

## 2017-09-29 ENCOUNTER — Ambulatory Visit
Admission: EM | Admit: 2017-09-29 | Discharge: 2017-09-29 | Disposition: A | Discharge status: Home / Self Care | Payer: Medicare Other | Attending: Family Medicine | Admitting: Family Medicine

## 2017-09-29 DIAGNOSIS — Z880 Allergy status to penicillin: Secondary | ICD-10-CM | POA: Insufficient documentation

## 2017-09-29 DIAGNOSIS — S81011A Laceration without foreign body, right knee, initial encounter: Secondary | ICD-10-CM | POA: Diagnosis not present

## 2017-09-29 DIAGNOSIS — W228XXA Striking against or struck by other objects, initial encounter: Secondary | ICD-10-CM | POA: Diagnosis not present

## 2017-09-29 DIAGNOSIS — Z9049 Acquired absence of other specified parts of digestive tract: Secondary | ICD-10-CM | POA: Diagnosis not present

## 2017-09-29 DIAGNOSIS — Z79899 Other long term (current) drug therapy: Secondary | ICD-10-CM | POA: Insufficient documentation

## 2017-09-29 DIAGNOSIS — W19XXXA Unspecified fall, initial encounter: Secondary | ICD-10-CM

## 2017-09-29 DIAGNOSIS — M1711 Unilateral primary osteoarthritis, right knee: Secondary | ICD-10-CM | POA: Insufficient documentation

## 2017-09-29 DIAGNOSIS — Z23 Encounter for immunization: Secondary | ICD-10-CM | POA: Diagnosis not present

## 2017-09-29 DIAGNOSIS — I10 Essential (primary) hypertension: Secondary | ICD-10-CM | POA: Insufficient documentation

## 2017-09-29 DIAGNOSIS — W268XXA Contact with other sharp object(s), not elsewhere classified, initial encounter: Secondary | ICD-10-CM

## 2017-09-29 MED ORDER — MUPIROCIN 2 % EX OINT: 1.0000 "application " | TOPICAL_OINTMENT | Freq: Three times a day (TID) | CUTANEOUS | 0 refills | Status: DC

## 2017-09-29 MED ORDER — TETANUS-DIPHTH-ACELL PERTUSSIS 5-2.5-18.5 LF-MCG/0.5 IM SUSP
0.5000 mL | Freq: Once | INTRAMUSCULAR | Status: AC
  Administered 2017-09-29: 0.5 mL via INTRAMUSCULAR

## 2017-09-29 MED ORDER — DOXYCYCLINE HYCLATE 100 MG PO CAPS: 100.0000 mg | ORAL_CAPSULE | Freq: Two times a day (BID) | ORAL | 0 refills | Status: DC

## 2017-09-29 NOTE — ED Triage Notes (Signed)
While bending over to "pull a cart", lost balance and fell. Now c/o 4-5" laceration to right knee. Bleeding controlled. Denies other injuries.

## 2017-09-29 NOTE — Discharge Instructions (Signed)
Keep dry for 24 hours.  After removing bandages begin applying Bactroban ointment 3 times daily until the staples are removed.  Plan on removing staples in 10 days.

## 2017-09-29 NOTE — ED Provider Notes (Signed)
[facility]    CSN: [id number] Arrival date & time: 09/29/17  1641     History   Chief Complaint Chief Complaint  Patient presents with  . Laceration    HPI Sabrina Green is a 72 y.o. female.   HPI  72 year old female who was doing a pull cart transport items into her house when she lost her balance as she tried to pull the car closer with her foot onto her knee and sustained 1/2 cm laceration to her right knee transversely.  She was able to walk on the leg without difficulty.  Not current on her tetanus.       Past Medical History:  Diagnosis Date  . Arthritis   . Hypertension     There are no active problems to display for this patient.   Past Surgical History:  Procedure Laterality Date  . ABDOMINAL HYSTERECTOMY    . ABDOMINAL HYSTERECTOMY    . CESAREAN SECTION    . CHOLECYSTECTOMY    . KNEE ARTHROSCOPY    . TONSILLECTOMY    . TUBAL LIGATION      OB History    No data available       Home Medications    Prior to Admission medications   Medication Sig Start Date End Date Taking? Authorizing Provider  allopurinol (ZYLOPRIM) 300 MG tablet Take 300 mg by mouth daily.   Yes [provider]  cholecalciferol (VITAMIN D) 400 units TABS tablet Take 400 Units by mouth.   Yes [provider]  estradiol (ESTRACE) 0.5 MG tablet Take 0.5 mg by mouth daily.   Yes [provider]  furosemide (LASIX) 10 MG/ML solution Take by mouth daily.   Yes [provider]  gabapentin (NEURONTIN) 300 MG capsule Take 300 mg by mouth 3 (three) times daily.   Yes [provider]  losartan (COZAAR) 100 MG tablet Take 100 mg by mouth daily.   Yes [provider]  methotrexate (RHEUMATREX) 2.5 MG tablet Take 7.5 mg by mouth 3 (three) times a week.   Yes [provider]  doxycycline (VIBRAMYCIN) 100 MG capsule Take 1 capsule (100 mg total) by mouth 2 (two) times daily. 09/29/17   Lorin Picket, PA-C    mupirocin ointment (BACTROBAN) 2 % Apply 1 application topically 3 (three) times daily. 09/29/17   Lorin Picket, PA-C    Family History Family History  Problem Relation Age of Onset  . Breast cancer Other 50  . Hypertension Mother   . Migraines Mother   . Other Father     Social History Social History   Tobacco Use  . Smoking status: Never Smoker  . Smokeless tobacco: Never Used  Substance Use Topics  . Alcohol use: No  . Drug use: No     Allergies   Penicillins   Review of Systems Review of Systems  Constitutional: Positive for activity change. Negative for chills, fatigue and fever.  Skin: Positive for wound.  All other systems reviewed and are negative.    Physical Exam Triage Vital Signs ED Triage Vitals  Enc Vitals Group     BP 09/29/17 1721 (!) 152/63     Pulse Rate 09/29/17 1721 62     Resp 09/29/17 1721 16     Temp 09/29/17 1721 98.5 F (36.9 C)     Temp Source 09/29/17 1721 Oral     SpO2 09/29/17 1721 100 %     Weight 09/29/17 1723 245 lb (111.1 kg)  Height 09/29/17 1723 5\' 5"  (1.651 m)     Head Circumference --      Peak Flow --      Pain Score --      Pain Loc --      Pain Edu? --      Excl. in [place]? --    No data found.  Updated Vital Signs BP (!) 152/63 (BP Location: Left Arm)   Pulse 62   Temp 98.5 F (36.9 C) (Oral)   Resp 16   Ht 5\' 5"  (1.651 m)   Wt 245 lb (111.1 kg)   SpO2 100%   BMI 40.77 kg/m   Visual Acuity Right Eye Distance:   Left Eye Distance:   Bilateral Distance:    Right Eye Near:   Left Eye Near:    Bilateral Near:     Physical Exam  Constitutional: She is oriented to person, place, and time. She appears well-developed and well-nourished. No distress.  HENT:  Head: Normocephalic.  Eyes: Pupils are equal, round, and reactive to light. Right eye exhibits no discharge. Left eye exhibits no discharge.  Neck: Normal range of motion.  Musculoskeletal: Normal range of motion. She exhibits tenderness.   Neurological: She is alert and oriented to person, place, and time.  Skin: Skin is warm and dry. She is not diaphoretic.  Psychiatric: She has a normal mood and affect. Her behavior is normal. Judgment and thought content normal.  Nursing note and vitals reviewed.      UC Treatments / Results  Labs (all labs ordered are listed, but only abnormal results are displayed) Labs Reviewed - No data to display  EKG  EKG Interpretation None       Radiology Dg Knee Ap/lat W/sunrise Right  Result Date: 09/29/2017 CLINICAL DATA:  Initial evaluation for acute trauma, laceration to right knee. EXAM: RIGHT KNEE 3 VIEWS COMPARISON:  None. FINDINGS: Skin staples overlie the infrapatellar region, consistent with laceration. Scattered foci of underlying soft tissue emphysema. No radiopaque foreign body. No acute fracture dislocation. No joint effusion. Moderate tricompartmental degenerative osteoarthrosis. IMPRESSION: 1. Soft tissue laceration at the anterior right knee with skin staples in place. No radiopaque foreign body. 2. No acute fracture or dislocation. 3. Moderate tricompartmental degenerative osteoarthrosis. Electronically Signed   By: Jeannine Boga M.D.   On: 09/29/2017 18:39    Procedures Laceration Repair Date/Time: 09/29/2017 6:28 PM Performed by: Lorin Picket, PA-C Authorized by: Coral Spikes, DO   Consent:    Consent obtained:  Verbal   Consent given by:  Patient   Risks discussed:  Infection and pain Anesthesia (see MAR for exact dosages):    Anesthesia method:  Local infiltration   Local anesthetic:  Lidocaine 1% WITH epi Laceration details:    Location:  Leg   Leg location:  R knee   Length (cm):  9.5   Depth (mm):  3 Repair type:    Repair type:  Intermediate Pre-procedure details:    Preparation:  Patient was prepped and draped in usual sterile fashion and imaging obtained to evaluate for foreign bodies Exploration:    Hemostasis achieved with:   Epinephrine   Wound exploration: entire depth of wound probed and visualized     Contaminated: no   Treatment:    Area cleansed with:  Betadine   Amount of cleaning:  Extensive   Irrigation solution:  Sterile water   Irrigation volume:  90   Irrigation method:  Pressure wash   Visualized foreign  bodies/material removed: no   Subcutaneous repair:    Suture size:  4-0   Suture material:  Vicryl   Suture technique:  Simple interrupted   Number of sutures:  5 Skin repair:    Repair method:  Staples   Number of staples:  12 Approximation:    Approximation:  Close   Vermilion border: well-aligned   Post-procedure details:    Dressing:  Antibiotic ointment, non-adherent dressing and sterile dressing   Patient tolerance of procedure:  Tolerated well, no immediate complications Comments:     Discussed with patient signs and symptoms of infection.  If any these occur she will return to our clinic immediately.  Plan on staple removal in 10 days    (including critical care time)  Medications Ordered in UC Medications  Tdap (BOOSTRIX) injection 0.5 mL (0.5 mLs Intramuscular Given 09/29/17 1734)     Initial Impression / Assessment and Plan / UC Course  I have reviewed the triage vital signs and the nursing notes.  Pertinent labs & imaging results that were available during my care of the patient were reviewed by me and considered in my medical decision making (see chart for details).     Plan: 1. Test/x-ray results and diagnosis reviewed with patient 2. rx as per orders; risks, benefits, potential side effects reviewed with patient 3. Recommend supportive treatment with keep dry for 24 hours.  Then begin a program of washing and applying Bactroban until staple removal.  Vigilant for signs and symptoms of infection that we discussed in detail.  Plan on removal of staples in 10 days. Finish.  All of the doxycycline as prescribed 4. F/u prn if symptoms worsen or don't improve   Final  Clinical Impressions(s) / UC Diagnoses   Final diagnoses:  Laceration of right knee, initial encounter    ED Discharge Orders        Ordered    mupirocin ointment (BACTROBAN) 2 %  3 times daily     09/29/17 1833    doxycycline (VIBRAMYCIN) 100 MG capsule  2 times daily     09/29/17 1833       Controlled Substance Prescriptions [place] Controlled Substance Registry consulted? Not Applicable   Lorin Picket, PA-C 09/29/17 1859

## 2017-10-09 ENCOUNTER — Other Ambulatory Visit: Payer: Self-pay

## 2017-10-09 ENCOUNTER — Encounter: Payer: Self-pay | Admitting: Emergency Medicine

## 2017-10-09 ENCOUNTER — Ambulatory Visit
Admission: EM | Admit: 2017-10-09 | Discharge: 2017-10-09 | Disposition: A | Discharge status: Home / Self Care | Payer: Medicare Other

## 2017-10-09 DIAGNOSIS — Z4802 Encounter for removal of sutures: Secondary | ICD-10-CM

## 2017-10-09 HISTORY — DX: Rheumatoid arthritis, unspecified: M06.9

## 2017-10-09 NOTE — ED Triage Notes (Signed)
Patient in today to have staples removed from right knee placed on 09/29/17.

## 2017-10-10 DIAGNOSIS — K760 Fatty (change of) liver, not elsewhere classified: Secondary | ICD-10-CM | POA: Insufficient documentation

## 2017-10-15 ENCOUNTER — Ambulatory Visit: Payer: Medicare Other

## 2017-10-16 ENCOUNTER — Ambulatory Visit
Admission: RE | Admit: 2017-10-16 | Discharge: 2017-10-16 | Disposition: A | Discharge status: Home / Self Care | Payer: Medicare Other | Source: Ambulatory Visit | Attending: Family Medicine | Admitting: Family Medicine

## 2017-10-16 DIAGNOSIS — Z1231 Encounter for screening mammogram for malignant neoplasm of breast: Secondary | ICD-10-CM | POA: Insufficient documentation

## 2017-12-08 ENCOUNTER — Ambulatory Visit: Payer: Medicare Other | Admitting: Nurse Practitioner

## 2017-12-11 ENCOUNTER — Encounter: Payer: Self-pay | Admitting: Nurse Practitioner

## 2017-12-11 ENCOUNTER — Other Ambulatory Visit: Payer: Self-pay

## 2017-12-11 ENCOUNTER — Ambulatory Visit: Payer: Medicare Other | Attending: Nurse Practitioner | Admitting: Nurse Practitioner

## 2017-12-11 VITALS — BP 146/67 | HR 58 | Temp 97.8°F | Resp 16 | Ht 65.0 in | Wt 246.0 lb

## 2017-12-11 DIAGNOSIS — N183 Chronic kidney disease, stage 3 (moderate): Secondary | ICD-10-CM | POA: Insufficient documentation

## 2017-12-11 DIAGNOSIS — E559 Vitamin D deficiency, unspecified: Secondary | ICD-10-CM | POA: Diagnosis not present

## 2017-12-11 DIAGNOSIS — M5136 Other intervertebral disc degeneration, lumbar region: Secondary | ICD-10-CM | POA: Insufficient documentation

## 2017-12-11 DIAGNOSIS — Z8249 Family history of ischemic heart disease and other diseases of the circulatory system: Secondary | ICD-10-CM | POA: Insufficient documentation

## 2017-12-11 DIAGNOSIS — Z79899 Other long term (current) drug therapy: Secondary | ICD-10-CM | POA: Insufficient documentation

## 2017-12-11 DIAGNOSIS — M25562 Pain in left knee: Secondary | ICD-10-CM | POA: Diagnosis not present

## 2017-12-11 DIAGNOSIS — M719 Bursopathy, unspecified: Secondary | ICD-10-CM | POA: Diagnosis not present

## 2017-12-11 DIAGNOSIS — M25511 Pain in right shoulder: Secondary | ICD-10-CM | POA: Insufficient documentation

## 2017-12-11 DIAGNOSIS — J45990 Exercise induced bronchospasm: Secondary | ICD-10-CM | POA: Diagnosis not present

## 2017-12-11 DIAGNOSIS — M5442 Lumbago with sciatica, left side: Secondary | ICD-10-CM

## 2017-12-11 DIAGNOSIS — I129 Hypertensive chronic kidney disease with stage 1 through stage 4 chronic kidney disease, or unspecified chronic kidney disease: Secondary | ICD-10-CM | POA: Diagnosis not present

## 2017-12-11 DIAGNOSIS — M79605 Pain in left leg: Secondary | ICD-10-CM

## 2017-12-11 DIAGNOSIS — M069 Rheumatoid arthritis, unspecified: Secondary | ICD-10-CM | POA: Insufficient documentation

## 2017-12-11 DIAGNOSIS — Z8489 Family history of other specified conditions: Secondary | ICD-10-CM | POA: Insufficient documentation

## 2017-12-11 DIAGNOSIS — E538 Deficiency of other specified B group vitamins: Secondary | ICD-10-CM | POA: Insufficient documentation

## 2017-12-11 DIAGNOSIS — Z803 Family history of malignant neoplasm of breast: Secondary | ICD-10-CM | POA: Insufficient documentation

## 2017-12-11 DIAGNOSIS — M25561 Pain in right knee: Secondary | ICD-10-CM | POA: Insufficient documentation

## 2017-12-11 DIAGNOSIS — K219 Gastro-esophageal reflux disease without esophagitis: Secondary | ICD-10-CM | POA: Diagnosis not present

## 2017-12-11 DIAGNOSIS — M1712 Unilateral primary osteoarthritis, left knee: Secondary | ICD-10-CM | POA: Diagnosis not present

## 2017-12-11 DIAGNOSIS — R700 Elevated erythrocyte sedimentation rate: Secondary | ICD-10-CM | POA: Diagnosis not present

## 2017-12-11 DIAGNOSIS — Z9049 Acquired absence of other specified parts of digestive tract: Secondary | ICD-10-CM | POA: Insufficient documentation

## 2017-12-11 DIAGNOSIS — G894 Chronic pain syndrome: Secondary | ICD-10-CM

## 2017-12-11 DIAGNOSIS — K760 Fatty (change of) liver, not elsewhere classified: Secondary | ICD-10-CM | POA: Diagnosis not present

## 2017-12-11 DIAGNOSIS — M899 Disorder of bone, unspecified: Secondary | ICD-10-CM | POA: Diagnosis not present

## 2017-12-11 DIAGNOSIS — M25552 Pain in left hip: Secondary | ICD-10-CM

## 2017-12-11 DIAGNOSIS — Z789 Other specified health status: Secondary | ICD-10-CM | POA: Diagnosis not present

## 2017-12-11 DIAGNOSIS — M797 Fibromyalgia: Secondary | ICD-10-CM | POA: Diagnosis not present

## 2017-12-11 DIAGNOSIS — G8929 Other chronic pain: Secondary | ICD-10-CM | POA: Diagnosis not present

## 2017-12-11 DIAGNOSIS — Z90710 Acquired absence of both cervix and uterus: Secondary | ICD-10-CM | POA: Insufficient documentation

## 2017-12-11 DIAGNOSIS — Z880 Allergy status to penicillin: Secondary | ICD-10-CM | POA: Diagnosis not present

## 2017-12-11 DIAGNOSIS — K589 Irritable bowel syndrome without diarrhea: Secondary | ICD-10-CM | POA: Insufficient documentation

## 2017-12-11 DIAGNOSIS — Z9889 Other specified postprocedural states: Secondary | ICD-10-CM | POA: Insufficient documentation

## 2017-12-11 NOTE — Patient Instructions (Addendum)
You will have your med/psych evaluation completed prior to next appt. with pain clinic.____________________________________________________________________________________________  Appointment Policy Summary  It is our goal and responsibility to provide the medical community with assistance in the evaluation and management of patients with chronic pain. Unfortunately our resources are limited. Because we do not have an unlimited amount of time, or available appointments, we are required to closely monitor and manage their use. The following rules exist to maximize their use:  Patient's responsibilities: 1. Punctuality:  At what time should I arrive? You should be physically present in our office 30 minutes before your scheduled appointment. Your scheduled appointment is with your assigned healthcare provider. However, it takes 5-10 minutes to be "checked-in", and another 15 minutes for the nurses to do the admission. If you arrive to our office at the time you were given for your appointment, you will end up being at least 20-25 minutes late to your appointment with the provider. 2. Tardiness:  What happens if I arrive only a few minutes after my scheduled appointment time? You will need to reschedule your appointment. The cutoff is your appointment time. This is why it is so important that you arrive at least 30 minutes before that appointment. If you have an appointment scheduled for 10:00 AM and you arrive at 10:01, you will be required to reschedule your appointment.  3. Plan ahead:  Always assume that you will encounter traffic on your way in. Plan for it. If you are dependent on a driver, make sure they understand these rules and the need to arrive early. 4. Other appointments and responsibilities:  Avoid scheduling any other appointments before or after your pain clinic appointments.  5. Be prepared:  Write down everything that you need to discuss with your healthcare provider and give this  information to the admitting nurse. Write down the medications that you will need refilled. Bring your pills and bottles (even the empty ones), to all of your appointments, except for those where a procedure is scheduled. 6. No children or pets:  Find someone to take care of them. It is not appropriate to bring them in. 7. Scheduling changes:  We request "advanced notification" of any changes or cancellations. 8. Advanced notification:  Defined as a time period of more than 24 hours prior to the originally scheduled appointment. This allows for the appointment to be offered to other patients. 9. Rescheduling:  When a visit is rescheduled, it will require the cancellation of the original appointment. For this reason they both fall within the category of "Cancellations".  10. Cancellations:  They require advanced notification. Any cancellation less than 24 hours before the  appointment will be recorded as a "No Show". 11. No Show:  Defined as an unkept appointment where the patient failed to notify or declare to the practice their intention or inability to keep the appointment.  Corrective process for repeat offenders:  1. Tardiness: Three (3) episodes of rescheduling due to late arrivals will be recorded as one (1) "No Show". 2. Cancellation or reschedule: Three (3) cancellations or rescheduling will be recorded as one (1) "No Show". 3. "No Shows": Three (3) "No Shows" within a 12 month period will result in discharge from the practice. ____________________________________________________________________________________________  ____________________________________________________________________________________________  Pain Scale  Introduction: The pain score used by this practice is the Verbal Numerical Rating Scale (VNRS-11). This is an 11-point scale. It is for adults and children 10 years or older. There are significant differences in how the pain score is  reported, used, and applied.  Forget everything you learned in the past and learn this scoring system.  General Information: The scale should reflect your current level of pain. Unless you are specifically asked for the level of your worst pain, or your average pain. If you are asked for one of these two, then it should be understood that it is over the past 24 hours.  Basic Activities of Daily Living (ADL): Personal hygiene, dressing, eating, transferring, and using restroom.  Instructions: Most patients tend to report their level of pain as a combination of two factors, their physical pain and their psychosocial pain. This last one is also known as "suffering" and it is reflection of how physical pain affects you socially and psychologically. From now on, report them separately. From this point on, when asked to report your pain level, report only your physical pain. Use the following table for reference.  Pain Clinic Pain Levels (0-5/10)  Pain Level Score  Description  No Pain 0   Mild pain 1 Nagging, annoying, but does not interfere with basic activities of daily living (ADL). Patients are able to eat, bathe, get dressed, toileting (being able to get on and off the toilet and perform personal hygiene functions), transfer (move in and out of bed or a chair without assistance), and maintain continence (able to control bladder and bowel functions). Blood pressure and heart rate are unaffected. A normal heart rate for a healthy adult ranges from 60 to 100 bpm (beats per minute).   Mild to moderate pain 2 Noticeable and distracting. Impossible to hide from other people. More frequent flare-ups. Still possible to adapt and function close to normal. It can be very annoying and may have occasional stronger flare-ups. With discipline, patients may get used to it and adapt.   Moderate pain 3 Interferes significantly with activities of daily living (ADL). It becomes difficult to feed, bathe, get dressed, get on and off the toilet or to  perform personal hygiene functions. Difficult to get in and out of bed or a chair without assistance. Very distracting. With effort, it can be ignored when deeply involved in activities.   Moderately severe pain 4 Impossible to ignore for more than a few minutes. With effort, patients may still be able to manage work or participate in some social activities. Very difficult to concentrate. Signs of autonomic nervous system discharge are evident: dilated pupils (mydriasis); mild sweating (diaphoresis); sleep interference. Heart rate becomes elevated (>115 bpm). Diastolic blood pressure (lower number) rises above 100 mmHg. Patients find relief in laying down and not moving.   Severe pain 5 Intense and extremely unpleasant. Associated with frowning face and frequent crying. Pain overwhelms the senses.  Ability to do any activity or maintain social relationships becomes significantly limited. Conversation becomes difficult. Pacing back and forth is common, as getting into a comfortable position is nearly impossible. Pain wakes you up from deep sleep. Physical signs will be obvious: pupillary dilation; increased sweating; goosebumps; brisk reflexes; cold, clammy hands and feet; nausea, vomiting or dry heaves; loss of appetite; significant sleep disturbance with inability to fall asleep or to remain asleep. When persistent, significant weight loss is observed due to the complete loss of appetite and sleep deprivation.  Blood pressure and heart rate becomes significantly elevated. Caution: If elevated blood pressure triggers a pounding headache associated with blurred vision, then the patient should immediately seek attention at an urgent or emergency care unit, as these may be signs of an impending stroke.  Emergency Department Pain Levels (6-10/10)  Emergency Room Pain 6 Severely limiting. Requires emergency care and should not be seen or managed at an outpatient pain management facility. Communication becomes  difficult and requires great effort. Assistance to reach the emergency department may be required. Facial flushing and profuse sweating along with potentially dangerous increases in heart rate and blood pressure will be evident.   Distressing pain 7 Self-care is very difficult. Assistance is required to transport, or use restroom. Assistance to reach the emergency department will be required. Tasks requiring coordination, such as bathing and getting dressed become very difficult.   Disabling pain 8 Self-care is no longer possible. At this level, pain is disabling. The individual is unable to do even the most "basic" activities such as walking, eating, bathing, dressing, transferring to a bed, or toileting. Fine motor skills are lost. It is difficult to think clearly.   Incapacitating pain 9 Pain becomes incapacitating. Thought processing is no longer possible. Difficult to remember your own name. Control of movement and coordination are lost.   The worst pain imaginable 10 At this level, most patients pass out from pain. When this level is reached, collapse of the autonomic nervous system occurs, leading to a sudden drop in blood pressure and heart rate. This in turn results in a temporary and dramatic drop in blood flow to the brain, leading to a loss of consciousness. Fainting is one of the body's self defense mechanisms. Passing out puts the brain in a calmed state and causes it to shut down for a while, in order to begin the healing process.    Summary: 1. Refer to this scale when providing [place] with your pain level. 2. Be accurate and careful when reporting your pain level. This will help with your care. 3. Over-reporting your pain level will lead to loss of credibility. 4. Even a level of 1/10 means that there is pain and will be treated at our facility. 5. High, inaccurate reporting will be documented as "Symptom Exaggeration", leading to loss of credibility and suspicions of possible secondary  gains such as obtaining more narcotics, or wanting to appear disabled, for fraudulent reasons. 6. Only pain levels of 5 or below will be seen at our facility. 7. Pain levels of 6 and above will be sent to the Emergency Department and the appointment cancelled. ____________________________________________________________________________________________

## 2017-12-11 NOTE — Progress Notes (Signed)
Safety precautions to be maintained throughout the outpatient stay will include: orient to surroundings, keep bed in low position, maintain call bell within reach at all times, provide assistance with transfer out of bed and ambulation.  

## 2017-12-11 NOTE — Progress Notes (Signed)
Patient's Name: Sabrina Green  MRN: [id number]  Referring Provider: Katheren Green  DOB: [birth year]  PCP: Sabrina Green  DOS: 12/11/2017  Note by: Sabrina David NP  Service setting: Ambulatory outpatient  Specialty: Interventional Pain Management  Location: [facility] (AMB) Pain Management Facility    Patient type: New Patient    Primary Reason(s) for Visit: Initial Patient Evaluation CC: Knee Pain (bilaterally)  HPI  Sabrina Green is a 72 y.o. year old, female patient, who comes today for an initial evaluation. She has BMI 40.0-44.9, adult ([place]); Bursitis of right shoulder; Chronic idiopathic gout of multiple sites; Chronic midline low back pain without sciatica; CKD (chronic kidney disease) stage 3, GFR 30-59 ml/min (HCC); Elevated erythrocyte sedimentation rate; DDD (degenerative disc disease), lumbar; Encounter for long-term (current) use of high-risk medication; Essential hypertension; Exercise-induced asthma; Fatty liver disease, nonalcoholic; Fibromyalgia; GERD without esophagitis; IBS (irritable bowel syndrome); Long term current use of opiate analgesic; Primary osteoarthritis of left knee; Rheumatoid arthritis involving multiple sites [facility]); Vitamin B12 deficiency; Vitamin D deficiency; Chronic pain syndrome; Pharmacologic therapy; Disorder of skeletal system; Problems influencing health status; Chronic pain of both knees (Primary Area of Pain) (L>R); Chronic bilateral low back pain with left-sided sciatica (Secondary Area of Pain) (L>R); Chronic pain of left lower extremity (Tertiary Area of Pain); Chronic right shoulder pain (Fourth Area of Pain); and Chronic hip pain, left on their problem list.. Her primarily concern today is the Knee Pain (bilaterally)  Pain Assessment: Location: Right, Left Knee Radiating: to shin area on left leg Onset: More than a month ago Duration: Chronic pain Quality: Constant, Aching, Shooting, Dull, Sharp Severity: 8 /10 (subjective, self-reported  pain score)  Note: Reported level is compatible with observation. Clinically the patient looks like a 2/10 A 2/10 is viewed as "Mild to Moderate" and described as noticeable and distracting. Impossible to hide from other people. More frequent flare-ups. Still possible to adapt and function close to normal. It can be very annoying and may have occasional stronger flare-ups. With discipline, patients may get used to it and adapt.       When using our objective Pain Scale, levels between 6 and 10/10 are said to belong in an emergency room, as it progressively worsens from a 6/10, described as severely limiting, requiring emergency care not usually available at an outpatient pain management facility. At a 6/10 level, communication becomes difficult and requires great effort. Assistance to reach the emergency department may be required. Facial flushing and profuse sweating along with potentially dangerous increases in heart rate and blood pressure will be evident. Effect on ADL: difficult to bend over to pick things up, balance, put on clothes, fasten shoes Timing: Constant Modifying factors: Tramadol. gabapentin BP: (!) 146/67  HR: (!) 58  Onset and Duration: Present longer than 3 months Cause of pain: "other-chronic" Severity: Getting worse, NAS-11 at its worse: 9/10, NAS-11 at its best: 5/10, NAS-11 now: 9/10 and NAS-11 on the average: 7/10 Timing: Morning, Afternoon, Night, During activity or exercise, After activity or exercise and After a period of immobility Aggravating Factors: Bending, Climbing, Kneeling, Lifiting, Motion, Prolonged sitting, Prolonged standing, Squatting, Stooping , Twisting, Walking, Walking uphill, Walking downhill and Working Alleviating Factors: Cold packs, Hot packs, Lying down, Medications, Resting, Sitting, Sleeping and physical therapy Associated Problems: Constipation, Day-time cramps, Night-time cramps, Depression, Dizziness, Fatigue, Numbness, Personality changes,  Spasms, Swelling, Tingling and Weakness Quality of Pain: Constant, Deep, Disabling, Distressing, Dreadful, Dull, Exhausting, Fearful, Feeling of constriction, Getting longer, Nagging, Pulsating, Tender, Throbbing  and Tingling Previous Examinations or Tests: Bone scan, CT scan, MRI scan, X-rays, Orthopedic evaluation and Chiropractic evaluation Previous Treatments: Epidural steroid injections, Morphine pump, Narcotic medications, Physical Therapy, Pool exercises, Steroid treatments by mouth, Stretching exercises, Traction and Trigger point injections  The patient comes into the clinics today for the first time for a chronic pain management evaluation. According to the patient her primary area of pain is in her knees. She admits that the left is greater than the right. She denies any swelling, numbness or tingling  She admits that she does have stiffness and occasional weakness with stepping off then curb. She is status post meniscus repair however cannot remember which knee. She admits that she suffered a fall February 2019 had to get 5 stitches and 12 staples in her right knee. She admits that she had cortisone injections in 3 months. She and she failed synvisc. She states she was unable to tolerate. She had physical therapy 3-4 years ago and it may have effective. She has had some recent images.   Her second area of pain is in her lower back.She admits that it goes across her back. She denies any previous surgeries. She did have epidural steroid with Dr. Sharlet Salina which was effective. She has done some home physical therapy and has been seen by a chiropractor in the past. She has had images.  Her third area of pain is in her legs. She admits that the back pain does radiate down the left side of her leg into her ankle.  Her fourth area of pain is in her right shoulder. She admits it does come and go. She admits that she has been told in the past that she had a rotator cuff tear. She went to physical  therapyand this was effective for her shoulder.   Today I took the time to provide the patient with information regarding this pain practice. The patient was informed that the practice is divided into two sections: an interventional pain management section, as well as a completely separate and distinct medication management section. I explained that there are procedure days for interventional therapies, and evaluation days for follow-ups and medication management. Because of the amount of documentation required during both, they are kept separated. This means that there is the possibility that shemay be scheduled for a procedure on one day, and medication management the next. I have also informed her that because of staffing and facility limitations, this practice will no longer take patients for medication management only. To illustrate the reasons for this, I gave the patient the example of surgeons, and how inappropriate it would be to refer a patient to his/her care, just to write for the post-surgical antibiotics on a surgery done by a different surgeon.   Because interventional pain management is part of the board-certified specialty for the doctors, the patient was informed that joining this practice means that they are open to any and all interventional therapies. I made it clear that this does not mean that they will be forced to have any procedures done. What this means is that I believe interventional therapies to be essential part of the diagnosis and proper management of chronic pain conditions. Therefore, patients not interested in these interventional alternatives will be better served under the care of a different practitioner.  The patient was also made aware of my Comprehensive Pain Management Safety Guidelines where by joining this practice, they limit all of their nerve blocks and joint injections to those done by  our practice, for as long as we are retained to manage their care. Historic  Controlled Substance Pharmacotherapy Review  PMP and historical list of controlled substances: hydrocodone/Chlorphen ER suspension, tramadol 50 mg, hydrocodone/acetaminophen 10/325 mg, Highest opioid analgesic regimen found: hydrocodone/acetaminophen 10/325 mg 1 tablet every 4 hours (fill date (06/05/2015) hydrocodone 60 mg per day Most recent opioid analgesic: tramadol 50 mg 4 times daily (fill date 01/09/2016) tramadol 200 mg per day Current opioid analgesics:  none Highest recorded MME/day: 86m/day MME/day:0 mg/day Medications: The patient did not bring the medication(s) to the appointment, as requested in our "New Patient Package" Pharmacodynamics: Desired effects: Analgesia: The patient reports >50% benefit. Reported improvement in function: The patient reports medication allows her to accomplish basic ADLs. Clinically meaningful improvement in function (CMIF): Sustained CMIF goals met Perceived effectiveness: Described as relatively effective, allowing for increase in activities of daily living (ADL) Undesirable effects: Side-effects or Adverse reactions: None reported Historical Monitoring: The patient  reports that she does not use drugs. List of all UDS Test(s): No results found for: MDMA, COCAINSCRNUR, PCPSCRNUR, PCPQUANT, CANNABQUANT, THCU, [place] of [place] Serum Drug Screening Test(s):  No results found for: AMPHSCRSER, BARBSCRSER, BENZOSCRSER, COCAINSCRSER, PCPSCRSER, PCPQUANT, THCSCRSER, CANNABQUANT, OPIATESCRSER, OXYSCRSER, PROPOXSCRSER Historical Background Evaluation: [place] PDMP: Six (6) year initial data search conducted.              Department of public safety, offender search: (Editor, commissioningInformation) Non-contributory Risk Assessment Profile: Aberrant behavior: None observed or detected today Risk factors for fatal opioid overdose: None identified today Fatal overdose hazard ratio (HR): Calculation deferred Non-fatal overdose hazard ratio (HR): Calculation deferred Risk  of opioid abuse or dependence: 0.7-3.0% with doses ? 36 MME/day and 6.1-26% with doses ? 120 MME/day. Substance use disorder (SUD) risk level: Pending results of Medical Psychology Evaluation for SUD Opioid risk tool (ORT) (Total Score): 2  ORT Scoring interpretation table:  Score <3 = Low Risk for SUD  Score between 4-7 = Moderate Risk for SUD  Score >8 = High Risk for Opioid Abuse   PHQ-2 Depression Scale:  Total score: 0  PHQ-2 Scoring interpretation table: (Score and probability of major depressive disorder)  Score 0 = No depression  Score 1 = 15.4% Probability  Score 2 = 21.1% Probability  Score 3 = 38.4% Probability  Score 4 = 45.5% Probability  Score 5 = 56.4% Probability  Score 6 = 78.6% Probability   PHQ-9 Depression Scale:  Total score: 0  PHQ-9 Scoring interpretation table:  Score 0-4 = No depression  Score 5-9 = Mild depression  Score 10-14 = Moderate depression  Score 15-19 = Moderately severe depression  Score 20-27 = Severe depression (2.4 times higher risk of SUD and 2.89 times higher risk of overuse)   Pharmacologic Plan: Pending ordered tests and/or consults  Meds  The patient has a current medication list which includes the following prescription(s): allopurinol, calcium-vitamin d, cholecalciferol, fluticasone, furosemide, gabapentin, losartan, lubiprostone, methotrexate, mupirocin ointment, omeprazole, promethazine, propranolol, sertraline, and tizanidine.  Current Outpatient Medications on File Prior to Visit  Medication Sig  . allopurinol (ZYLOPRIM) 300 MG tablet Take 300 mg by mouth daily.  . calcium-vitamin D 250-100 MG-UNIT tablet Take 1 tablet by mouth 2 (two) times daily.  . cholecalciferol (VITAMIN D) 400 units TABS tablet Take 2,000 Units by mouth.   . fluticasone (FLONASE) 50 MCG/ACT nasal spray Place 2 sprays into both nostrils daily.  . furosemide (LASIX) 10 MG/ML solution Take by mouth daily.  .Marland Kitchengabapentin (NEURONTIN) 300  MG capsule Take 300  mg by mouth 2 (two) times daily.   Marland Kitchen losartan (COZAAR) 100 MG tablet Take 100 mg by mouth daily.  Marland Kitchen lubiprostone (AMITIZA) 24 MCG capsule Take 24 mcg by mouth 2 (two) times daily with a meal.  . methotrexate (RHEUMATREX) 2.5 MG tablet Take 7.5 mg by mouth 3 (three) times a week.  . mupirocin ointment (BACTROBAN) 2 % Apply 1 application topically 3 (three) times daily.  Marland Kitchen omeprazole (PRILOSEC) 20 MG capsule Take 20 mg by mouth daily.  . promethazine (PHENERGAN) 25 MG tablet Take 25 mg by mouth every 6 (six) hours as needed for nausea or vomiting.  . propranolol (INDERAL) 80 MG tablet Take 80 mg by mouth daily.  . sertraline (ZOLOFT) 100 MG tablet Take 100 mg by mouth daily.  Marland Kitchen tiZANidine (ZANAFLEX) 4 MG capsule Take 4 mg by mouth 2 (two) times daily as needed for muscle spasms.   No current facility-administered medications on file prior to visit.    Imaging Review   Note: No new results found.        ROS  Cardiovascular History: High blood pressure Pulmonary or Respiratory History: Wheezing and difficulty taking a deep full breath (Asthma) and Snoring  Neurological History: Curved spine Review of Past Neurological Studies:  Results for orders placed or performed during the hospital encounter of 09/07/17  CT Head Wo Contrast   Narrative   CLINICAL DATA:  Fall down stairs last night with head injury and loss of consciousness. Dizziness.  EXAM: CT HEAD WITHOUT CONTRAST  TECHNIQUE: Contiguous axial images were obtained from the base of the skull through the vertex without intravenous contrast.  COMPARISON:  None.  FINDINGS: Brain: No evidence of acute infarction, hemorrhage, hydrocephalus, extra-axial collection or mass lesion/mass effect.  Vascular: No hyperdense vessel or unexpected calcification.  Skull: Normal. Negative for fracture or focal lesion.  Sinuses/Orbits: No acute finding.  Other: None.  IMPRESSION: Normal head CT.   Electronically Signed   By: Aletta Edouard M.D.   On: 09/07/2017 13:01    Psychological-Psychiatric History: Anxiousness, Depressed and Prone to panicking Gastrointestinal History: Heartburn due to stomach pushing into lungs (Hiatal hernia), Alternating episodes iof diarrhea and constipation (IBS-Irritable bowe syndrome) and Irregular, infrequent bowel movements (Constipation) Genitourinary History: Passing kidney stones, Peeing blood and Recurrent Urinary Tract infections Hematological History: Weakness due to low blood hemoglobin or red blood cell count (Anemia) and Brusing easily Endocrine History: No reported endocrine signs or symptoms such as high or low blood sugar, rapid heart rate due to high thyroid levels, obesity or weight gain due to slow thyroid or thyroid disease Rheumatologic History: Rheumatoid arthritis Musculoskeletal History: Negative for myasthenia gravis, muscular dystrophy, multiple sclerosis or malignant hyperthermia Work History: Retired  Allergies  Ms. Busbee is allergic to penicillins.  Laboratory Chemistry  Inflammation Markers No results found for: CRP, ESRSEDRATE (CRP: Acute Phase) (ESR: Chronic Phase) Renal Function Markers Lab Results  Component Value Date   BUN 19 09/07/2017   CREATININE 1.28 (H) 09/07/2017   GFRAA 48 (L) 09/07/2017   GFRNONAA 42 (L) 09/07/2017   Hepatic Function Markers No results found for: AST, ALT, ALBUMIN, ALKPHOS, HCVAB Electrolytes Lab Results  Component Value Date   NA 140 09/07/2017   K 4.4 09/07/2017   CL 104 09/07/2017   CALCIUM 9.2 09/07/2017   Neuropathy Markers No results found for: [id number] Bone Pathology Markers Lab Results  Component Value Date   CALCIUM 9.2 09/07/2017   Coagulation Parameters Lab  Results  Component Value Date   PLT 132 (L) 09/07/2017   Cardiovascular Markers Lab Results  Component Value Date   HGB 12.2 09/07/2017   HCT 36.7 09/07/2017   Note: Lab results reviewed.  PFSH  Drug: Ms. Klinger  reports that she  does not use drugs. Alcohol:  reports that she does not drink alcohol. Tobacco:  reports that she has never smoked. She has never used smokeless tobacco. Medical:  has a past medical history of Arthritis, Hypertension, and Rheumatoid arthritis ([place]). Family: family history includes Breast cancer (age of onset: 57) in her other; Hypertension in her mother; Migraines in her mother; Other in her father.  Past Surgical History:  Procedure Laterality Date  . ABDOMINAL HYSTERECTOMY    . ABDOMINAL HYSTERECTOMY    . [place], [place]  . CESAREAN SECTION    . CHOLECYSTECTOMY    . EYE SURGERY Bilateral 2015   cataract surgery, [place], [place]  . KNEE ARTHROSCOPY    . TONSILLECTOMY    . TUBAL LIGATION     Active Ambulatory Problems    Diagnosis Date Noted  . BMI 40.0-44.9, adult ([place]) 03/24/2017  . Bursitis of right shoulder 06/06/2016  . Chronic idiopathic gout of multiple sites 02/26/2016  . Chronic midline low back pain without sciatica 04/22/2017  . CKD (chronic kidney disease) stage 3, GFR 30-59 ml/min (HCC) 03/28/2017  . Elevated erythrocyte sedimentation rate 02/12/2017  . DDD (degenerative disc disease), lumbar 06/02/2017  . Encounter for long-term (current) use of high-risk medication 03/06/2016  . Essential hypertension 02/26/2016  . Exercise-induced asthma 02/26/2016  . Fatty liver disease, nonalcoholic 73/42/8768  . Fibromyalgia 02/26/2016  . GERD without esophagitis 02/26/2016  . IBS (irritable bowel syndrome) 02/26/2016  . Long term current use of opiate analgesic 03/06/2016  . Primary osteoarthritis of left knee 12/26/2016  . Rheumatoid arthritis involving multiple sites ([place]) 02/26/2016  . Vitamin B12 deficiency 02/26/2016  . Vitamin D deficiency 02/26/2016  . Chronic pain syndrome 12/11/2017  . Pharmacologic therapy 12/11/2017  . Disorder of skeletal system 12/11/2017  . Problems influencing health status 12/11/2017   . Chronic pain of both knees (Primary Area of Pain) (L>R) 12/11/2017  . Chronic bilateral low back pain with left-sided sciatica (Secondary Area of Pain) (L>R) 12/11/2017  . Chronic pain of left lower extremity (Tertiary Area of Pain) 12/11/2017  . Chronic right shoulder pain (Fourth Area of Pain) 12/11/2017  . Chronic hip pain, left 12/11/2017   Resolved Ambulatory Problems    Diagnosis Date Noted  . No Resolved Ambulatory Problems   Past Medical History:  Diagnosis Date  . Arthritis   . Hypertension   . Rheumatoid arthritis ([place])    Constitutional Exam  General appearance: Well nourished, well developed, and well hydrated. In no apparent acute distress Vitals:   12/11/17 1009  BP: (!) 146/67  Pulse: (!) 58  Resp: 16  Temp: 97.8 F (36.6 C)  TempSrc: Oral  SpO2: 100%  Weight: 246 lb (111.6 kg)  Height: 5' 5"  (1.651 m)   BMI Assessment: Estimated body mass index is 40.94 kg/m as calculated from the following:   Height as of this encounter: 5' 5"  (1.651 m).   Weight as of this encounter: 246 lb (111.6 kg).  BMI interpretation table: BMI level Category Range association with higher incidence of chronic pain  <18 kg/m2 Underweight   18.5-24.9 kg/m2 Ideal body weight   25-29.9 kg/m2 Overweight Increased incidence by 20%  30-34.9 kg/m2 Obese (Class I) Increased incidence by 68%  35-39.9 kg/m2 Severe obesity (Class II) Increased incidence by 136%  >40 kg/m2 Extreme obesity (Class III) Increased incidence by 254%   BMI Readings from Last 4 Encounters:  12/11/17 40.94 kg/m  10/09/17 40.77 kg/m  09/29/17 40.77 kg/m  09/07/17 41.60 kg/m   Wt Readings from Last 4 Encounters:  12/11/17 246 lb (111.6 kg)  10/09/17 245 lb (111.1 kg)  09/29/17 245 lb (111.1 kg)  09/07/17 250 lb (113.4 kg)  Psych/Mental status: Alert, oriented x 3 (person, place, & time)       Eyes: PERLA Respiratory: No evidence of acute respiratory distress  Cervical Spine Exam  Inspection: No  masses, redness, or swelling Alignment: Symmetrical Functional ROM: Unrestricted ROM      Stability: No instability detected Muscle strength & Tone: Functionally intact Sensory: Unimpaired Palpation: No palpable anomalies              Upper Extremity (UE) Exam    Side: Right upper extremity  Side: Left upper extremity  Inspection: No masses, redness, swelling, or asymmetry. No contractures  Inspection: No masses, redness, swelling, or asymmetry. No contractures  Functional ROM: Unrestricted ROM          Functional ROM: Unrestricted ROM          Muscle strength & Tone: Functionally intact  Muscle strength & Tone: Functionally intact  Sensory: Unimpaired  Sensory: Unimpaired  Palpation: No palpable anomalies              Palpation: No palpable anomalies              Specialized Test(s): Deferred         Specialized Test(s): Deferred          Thoracic Spine Exam  Inspection: No masses, redness, or swelling Alignment: Symmetrical Functional ROM: Unrestricted ROM Stability: No instability detected Sensory: Unimpaired Muscle strength & Tone: No palpable anomalies  Lumbar Spine Exam  Inspection: No masses, redness, or swelling Alignment: Symmetrical Functional ROM: Adequate ROM      Stability: No instability detected Muscle strength & Tone: Functionally intact Sensory: Unimpaired Palpation: Complains of area being tender to palpation       Provocative Tests: Lumbar Hyperextension and rotation test: Positive bilaterally for facet joint pain. Patrick's Maneuver: Positive for bilateral S-I arthralgia and for left hip arthralgia  Gait & Posture Assessment  Ambulation: Unassisted Gait: Relatively normal for age and body habitus Posture: WNL   Lower Extremity Exam    Side: Right lower extremity  Side: Left lower extremity  Inspection: No masses, redness, swelling, or asymmetry. No contractures  Inspection: No masses, redness, swelling, or asymmetry. No contractures  Functional ROM:  Unrestricted ROM          Functional ROM: Unrestricted ROM          Muscle strength & Tone: Functionally intact  Muscle strength & Tone: Functionally intact  Sensory: Unimpaired  Sensory: Unimpaired  Palpation: No palpable anomalies  Palpation: No palpable anomalies   Assessment  Primary Diagnosis & Pertinent Problem List: The primary encounter diagnosis was Chronic pain of both knees (L>R). Diagnoses of Chronic bilateral low back pain with left-sided sciatica (Secondary Area of Pain) (L>R), Chronic pain of left lower extremity (Tertiary Area of Pain), Chronic right shoulder pain (Fourth Area of Pain), Chronic hip pain, left, Chronic pain syndrome, Pharmacologic therapy, Disorder of skeletal system, and Problems influencing health status were also pertinent to this visit.  Visit Diagnosis:  1. Chronic pain of both knees (L>R)   2. Chronic bilateral low back pain with left-sided sciatica (Secondary Area of Pain) (L>R)   3. Chronic pain of left lower extremity (Tertiary Area of Pain)   4. Chronic right shoulder pain (Fourth Area of Pain)   5. Chronic hip pain, left   6. Chronic pain syndrome   7. Pharmacologic therapy   8. Disorder of skeletal system   9. Problems influencing health status    Plan of Care  Initial treatment plan:  Please be advised that as per protocol, today's visit has been an evaluation only. We have not taken over the patient's controlled substance management.  Problem-specific plan: No problem-specific Assessment & Plan notes found for this encounter.  Ordered Lab-work, Procedure(s), Referral(s), & Consult(s): Orders Placed This Encounter  Procedures  . DG HIP UNILAT W OR W/O PELVIS 2-3 VIEWS LEFT  . DG Shoulder Right  . Compliance Drug Analysis, Ur  . Comp. Metabolic Panel (12)  . Magnesium  . Vitamin B12  . Sedimentation rate  . 25-Hydroxyvitamin D Lcms D2+D3  . C-reactive protein  . Ambulatory referral to Psychology   Pharmacotherapy: Medications  ordered:  No orders of the defined types were placed in this encounter.  Medications administered during this visit: Anabel A. Meetze had no medications administered during this visit.   Pharmacotherapy under consideration:  Opioid Analgesics: The patient was informed that there is no guarantee that she would be a candidate for opioid analgesics. The decision will be made following CDC guidelines. This decision will be based on the results of diagnostic studies, as well as Ms. Robb's risk profile.  Membrane stabilizer: To be determined at a later time Muscle relaxant: To be determined at a later time NSAID: To be determined at a later time Other analgesic(s): To be determined at a later time   Interventional therapies under consideration: Ms. Rumer was informed that there is no guarantee that she would be a candidate for interventional therapies. The decision will be based on the results of diagnostic studies, as well as Ms. Veazey's risk profile.  Possible procedure(s): Diagnostic bilateral intra-articular knee injection Diagnostic bilateral Hyalgan series Diagnostic bilateral genicular nerve blocks Possible  bilateral genicular nerve RFA Diagnostic left side LESI Diagnostic bilateral lumbar facet nerve block Diagnostic bilateral lumbar facet RFA Diagnostic right shoulder intra-articular joint injection Diagnostic right suprascapular nerve block Possible right suprascapular RFA   Provider-requested follow-up: Return for 2nd Visit, w/ Dr. Dossie Arbour, after [facility] eval.  No future appointments.  Primary Care Physician: Sabrina Green Location: [facility] Note by:  Date: 12/11/2017; Time: 3:34 PM  Pain Score Disclaimer: We use the NRS-11 scale. This is a self-reported, subjective measurement of pain severity with only modest accuracy. It is used primarily to identify changes within a particular patient. It must be understood that outpatient pain  scales are significantly less accurate that those used for research, where they can be applied under ideal controlled circumstances with minimal exposure to variables. In reality, the score is likely to be a combination of pain intensity and pain affect, where pain affect describes the degree of emotional arousal or changes in action readiness caused by the sensory experience of pain. Factors such as social and work situation, setting, emotional state, anxiety levels, expectation, and prior pain experience may influence pain perception and show large inter-individual differences that may also be affected by time variables.  Patient instructions provided during this appointment: Patient Instructions   You will  have your med/psych evaluation completed prior to next appt. with pain clinic.____________________________________________________________________________________________  Appointment Policy Summary  It is our goal and responsibility to provide the medical community with assistance in the evaluation and management of patients with chronic pain. Unfortunately our resources are limited. Because we do not have an unlimited amount of time, or available appointments, we are required to closely monitor and manage their use. The following rules exist to maximize their use:  Patient's responsibilities: 1. Punctuality:  At what time should I arrive? You should be physically present in our office 30 minutes before your scheduled appointment. Your scheduled appointment is with your assigned healthcare provider. However, it takes 5-10 minutes to be "checked-in", and another 15 minutes for the nurses to do the admission. If you arrive to our office at the time you were given for your appointment, you will end up being at least 20-25 minutes late to your appointment with the provider. 2. Tardiness:  What happens if I arrive only a few minutes after my scheduled appointment time? You will need to reschedule  your appointment. The cutoff is your appointment time. This is why it is so important that you arrive at least 30 minutes before that appointment. If you have an appointment scheduled for 10:00 AM and you arrive at 10:01, you will be required to reschedule your appointment.  3. Plan ahead:  Always assume that you will encounter traffic on your way in. Plan for it. If you are dependent on a driver, make sure they understand these rules and the need to arrive early. 4. Other appointments and responsibilities:  Avoid scheduling any other appointments before or after your pain clinic appointments.  5. Be prepared:  Write down everything that you need to discuss with your healthcare provider and give this information to the admitting nurse. Write down the medications that you will need refilled. Bring your pills and bottles (even the empty ones), to all of your appointments, except for those where a procedure is scheduled. 6. No children or pets:  Find someone to take care of them. It is not appropriate to bring them in. 7. Scheduling changes:  We request "advanced notification" of any changes or cancellations. 8. Advanced notification:  Defined as a time period of more than 24 hours prior to the originally scheduled appointment. This allows for the appointment to be offered to other patients. 9. Rescheduling:  When a visit is rescheduled, it will require the cancellation of the original appointment. For this reason they both fall within the category of "Cancellations".  10. Cancellations:  They require advanced notification. Any cancellation less than 24 hours before the  appointment will be recorded as a "No Show". 11. No Show:  Defined as an unkept appointment where the patient failed to notify or declare to the practice their intention or inability to keep the appointment.  Corrective process for repeat offenders:  1. Tardiness: Three (3) episodes of rescheduling due to late arrivals will be  recorded as one (1) "No Show". 2. Cancellation or reschedule: Three (3) cancellations or rescheduling will be recorded as one (1) "No Show". 3. "No Shows": Three (3) "No Shows" within a 12 month period will result in discharge from the practice. ____________________________________________________________________________________________  ____________________________________________________________________________________________  Pain Scale  Introduction: The pain score used by this practice is the Verbal Numerical Rating Scale (VNRS-11). This is an 11-point scale. It is for adults and children 10 years or older. There are significant differences in how the pain score is reported,  used, and applied. Forget everything you learned in the past and learn this scoring system.  General Information: The scale should reflect your current level of pain. Unless you are specifically asked for the level of your worst pain, or your average pain. If you are asked for one of these two, then it should be understood that it is over the past 24 hours.  Basic Activities of Daily Living (ADL): Personal hygiene, dressing, eating, transferring, and using restroom.  Instructions: Most patients tend to report their level of pain as a combination of two factors, their physical pain and their psychosocial pain. This last one is also known as "suffering" and it is reflection of how physical pain affects you socially and psychologically. From now on, report them separately. From this point on, when asked to report your pain level, report only your physical pain. Use the following table for reference.  Pain Clinic Pain Levels (0-5/10)  Pain Level Score  Description  No Pain 0   Mild pain 1 Nagging, annoying, but does not interfere with basic activities of daily living (ADL). Patients are able to eat, bathe, get dressed, toileting (being able to get on and off the toilet and perform personal hygiene functions), transfer (move  in and out of bed or a chair without assistance), and maintain continence (able to control bladder and bowel functions). Blood pressure and heart rate are unaffected. A normal heart rate for a healthy adult ranges from 60 to 100 bpm (beats per minute).   Mild to moderate pain 2 Noticeable and distracting. Impossible to hide from other people. More frequent flare-ups. Still possible to adapt and function close to normal. It can be very annoying and may have occasional stronger flare-ups. With discipline, patients may get used to it and adapt.   Moderate pain 3 Interferes significantly with activities of daily living (ADL). It becomes difficult to feed, bathe, get dressed, get on and off the toilet or to perform personal hygiene functions. Difficult to get in and out of bed or a chair without assistance. Very distracting. With effort, it can be ignored when deeply involved in activities.   Moderately severe pain 4 Impossible to ignore for more than a few minutes. With effort, patients may still be able to manage work or participate in some social activities. Very difficult to concentrate. Signs of autonomic nervous system discharge are evident: dilated pupils (mydriasis); mild sweating (diaphoresis); sleep interference. Heart rate becomes elevated (>115 bpm). Diastolic blood pressure (lower number) rises above 100 mmHg. Patients find relief in laying down and not moving.   Severe pain 5 Intense and extremely unpleasant. Associated with frowning face and frequent crying. Pain overwhelms the senses.  Ability to do any activity or maintain social relationships becomes significantly limited. Conversation becomes difficult. Pacing back and forth is common, as getting into a comfortable position is nearly impossible. Pain wakes you up from deep sleep. Physical signs will be obvious: pupillary dilation; increased sweating; goosebumps; brisk reflexes; cold, clammy hands and feet; nausea, vomiting or dry heaves; loss  of appetite; significant sleep disturbance with inability to fall asleep or to remain asleep. When persistent, significant weight loss is observed due to the complete loss of appetite and sleep deprivation.  Blood pressure and heart rate becomes significantly elevated. Caution: If elevated blood pressure triggers a pounding headache associated with blurred vision, then the patient should immediately seek attention at an urgent or emergency care unit, as these may be signs of an impending stroke.  Emergency Department Pain Levels (6-10/10)  Emergency Room Pain 6 Severely limiting. Requires emergency care and should not be seen or managed at an outpatient pain management facility. Communication becomes difficult and requires great effort. Assistance to reach the emergency department may be required. Facial flushing and profuse sweating along with potentially dangerous increases in heart rate and blood pressure will be evident.   Distressing pain 7 Self-care is very difficult. Assistance is required to transport, or use restroom. Assistance to reach the emergency department will be required. Tasks requiring coordination, such as bathing and getting dressed become very difficult.   Disabling pain 8 Self-care is no longer possible. At this level, pain is disabling. The individual is unable to do even the most "basic" activities such as walking, eating, bathing, dressing, transferring to a bed, or toileting. Fine motor skills are lost. It is difficult to think clearly.   Incapacitating pain 9 Pain becomes incapacitating. Thought processing is no longer possible. Difficult to remember your own name. Control of movement and coordination are lost.   The worst pain imaginable 10 At this level, most patients pass out from pain. When this level is reached, collapse of the autonomic nervous system occurs, leading to a sudden drop in blood pressure and heart rate. This in turn results in a temporary and dramatic  drop in blood flow to the brain, leading to a loss of consciousness. Fainting is one of the body's self defense mechanisms. Passing out puts the brain in a calmed state and causes it to shut down for a while, in order to begin the healing process.    Summary: 1. Refer to this scale when providing [place] with your pain level. 2. Be accurate and careful when reporting your pain level. This will help with your care. 3. Over-reporting your pain level will lead to loss of credibility. 4. Even a level of 1/10 means that there is pain and will be treated at our facility. 5. High, inaccurate reporting will be documented as "Symptom Exaggeration", leading to loss of credibility and suspicions of possible secondary gains such as obtaining more narcotics, or wanting to appear disabled, for fraudulent reasons. 6. Only pain levels of 5 or below will be seen at our facility. 7. Pain levels of 6 and above will be sent to the Emergency Department and the appointment cancelled. ____________________________________________________________________________________________

## 2017-12-15 ENCOUNTER — Encounter: Payer: Self-pay | Admitting: Nurse Practitioner

## 2017-12-15 ENCOUNTER — Other Ambulatory Visit: Payer: Self-pay | Admitting: Nurse Practitioner

## 2017-12-15 DIAGNOSIS — R7982 Elevated C-reactive protein (CRP): Secondary | ICD-10-CM

## 2017-12-15 DIAGNOSIS — R700 Elevated erythrocyte sedimentation rate: Secondary | ICD-10-CM

## 2017-12-16 LAB — COMP. METABOLIC PANEL (12)
ALBUMIN: 3.9 g/dL (ref 3.6–4.8)
AST: 14 IU/L (ref 0–40)
Albumin/Globulin Ratio: 1.6 (ref 1.2–2.2)
Alkaline Phosphatase: 79 IU/L (ref 39–117)
BILIRUBIN TOTAL: 0.5 mg/dL (ref 0.0–1.2)
BUN / CREAT RATIO: 16 (ref 12–28)
BUN: 24 mg/dL (ref 8–27)
CREATININE: 1.54 mg/dL — AB (ref 0.57–1.00)
Calcium: 9.4 mg/dL (ref 8.7–10.3)
Chloride: 104 mmol/L (ref 96–106)
GFR calc Af Amer: 39 mL/min/{1.73_m2} — ABNORMAL LOW (ref >59)
GFR calc non Af Amer: 34 mL/min/{1.73_m2} — ABNORMAL LOW (ref >59)
GLOBULIN, TOTAL: 2.5 g/dL (ref 1.5–4.5)
GLUCOSE: 90 mg/dL (ref 65–99)
Potassium: 4.5 mmol/L (ref 3.5–5.2)
SODIUM: 146 mmol/L — AB (ref 134–144)
TOTAL PROTEIN: 6.4 g/dL (ref 6.0–8.5)

## 2017-12-16 LAB — 25-HYDROXY VITAMIN D LCMS D2+D3
25-Hydroxy, Vitamin D-2: <1 ng/mL
25-Hydroxy, Vitamin D-3: 46 ng/mL

## 2017-12-16 LAB — MAGNESIUM: MAGNESIUM: 1.7 mg/dL (ref 1.6–2.3)

## 2017-12-16 LAB — 25-HYDROXYVITAMIN D LCMS D2+D3: 25-HYDROXY, VITAMIN D: 46 ng/mL

## 2017-12-16 LAB — SEDIMENTATION RATE: Sed Rate: 90 mm/hr — ABNORMAL HIGH (ref 0–40)

## 2017-12-16 LAB — VITAMIN B12: Vitamin B-12: 358 pg/mL (ref 232–1245)

## 2017-12-16 LAB — C-REACTIVE PROTEIN: CRP: 19.5 mg/L — AB (ref 0.0–4.9)

## 2017-12-18 LAB — COMPLIANCE DRUG ANALYSIS, UR

## 2017-12-20 LAB — RHEUMATOID FACTOR: Rhuematoid fact SerPl-aCnc: <10 IU/mL (ref 0.0–13.9)

## 2017-12-20 LAB — ANA W/REFLEX IF POSITIVE: ANA: NEGATIVE

## 2017-12-20 LAB — SPECIMEN STATUS REPORT

## 2018-01-05 ENCOUNTER — Ambulatory Visit
Admission: RE | Admit: 2018-01-05 | Discharge: 2018-01-05 | Disposition: A | Discharge status: Home / Self Care | Payer: Medicare Other | Source: Ambulatory Visit | Attending: Pain Medicine | Admitting: Pain Medicine

## 2018-01-05 ENCOUNTER — Ambulatory Visit
Admission: RE | Admit: 2018-01-05 | Discharge: 2018-01-05 | Disposition: A | Discharge status: Home / Self Care | Payer: Medicare Other | Source: Ambulatory Visit | Attending: Nurse Practitioner | Admitting: Nurse Practitioner

## 2018-01-05 ENCOUNTER — Ambulatory Visit: Payer: Medicare Other | Attending: Pain Medicine | Admitting: Pain Medicine

## 2018-01-05 ENCOUNTER — Encounter: Payer: Self-pay | Admitting: Pain Medicine

## 2018-01-05 ENCOUNTER — Other Ambulatory Visit: Payer: Self-pay

## 2018-01-05 VITALS — BP 121/76 | HR 71 | Temp 97.8°F | Resp 18 | Ht 65.0 in | Wt 250.0 lb

## 2018-01-05 DIAGNOSIS — Z8659 Personal history of other mental and behavioral disorders: Secondary | ICD-10-CM | POA: Insufficient documentation

## 2018-01-05 DIAGNOSIS — G894 Chronic pain syndrome: Secondary | ICD-10-CM

## 2018-01-05 DIAGNOSIS — M545 Low back pain, unspecified: Secondary | ICD-10-CM

## 2018-01-05 DIAGNOSIS — M5442 Lumbago with sciatica, left side: Principal | ICD-10-CM

## 2018-01-05 DIAGNOSIS — E559 Vitamin D deficiency, unspecified: Secondary | ICD-10-CM

## 2018-01-05 DIAGNOSIS — M899 Disorder of bone, unspecified: Secondary | ICD-10-CM | POA: Diagnosis not present

## 2018-01-05 DIAGNOSIS — Z79899 Other long term (current) drug therapy: Secondary | ICD-10-CM | POA: Diagnosis not present

## 2018-01-05 DIAGNOSIS — M1712 Unilateral primary osteoarthritis, left knee: Secondary | ICD-10-CM

## 2018-01-05 DIAGNOSIS — M79605 Pain in left leg: Secondary | ICD-10-CM | POA: Insufficient documentation

## 2018-01-05 DIAGNOSIS — M47817 Spondylosis without myelopathy or radiculopathy, lumbosacral region: Secondary | ICD-10-CM | POA: Insufficient documentation

## 2018-01-05 DIAGNOSIS — M797 Fibromyalgia: Secondary | ICD-10-CM | POA: Diagnosis not present

## 2018-01-05 DIAGNOSIS — M5136 Other intervertebral disc degeneration, lumbar region: Secondary | ICD-10-CM | POA: Diagnosis not present

## 2018-01-05 DIAGNOSIS — M7551 Bursitis of right shoulder: Secondary | ICD-10-CM | POA: Diagnosis not present

## 2018-01-05 DIAGNOSIS — Z789 Other specified health status: Secondary | ICD-10-CM | POA: Diagnosis not present

## 2018-01-05 DIAGNOSIS — M25512 Pain in left shoulder: Secondary | ICD-10-CM

## 2018-01-05 DIAGNOSIS — M25562 Pain in left knee: Secondary | ICD-10-CM | POA: Insufficient documentation

## 2018-01-05 DIAGNOSIS — Z6841 Body Mass Index (BMI) 40.0 and over, adult: Secondary | ICD-10-CM

## 2018-01-05 DIAGNOSIS — Z79891 Long term (current) use of opiate analgesic: Secondary | ICD-10-CM

## 2018-01-05 DIAGNOSIS — I129 Hypertensive chronic kidney disease with stage 1 through stage 4 chronic kidney disease, or unspecified chronic kidney disease: Secondary | ICD-10-CM | POA: Diagnosis not present

## 2018-01-05 DIAGNOSIS — G8929 Other chronic pain: Secondary | ICD-10-CM | POA: Insufficient documentation

## 2018-01-05 DIAGNOSIS — M170 Bilateral primary osteoarthritis of knee: Secondary | ICD-10-CM

## 2018-01-05 DIAGNOSIS — M19011 Primary osteoarthritis, right shoulder: Secondary | ICD-10-CM

## 2018-01-05 DIAGNOSIS — M19012 Primary osteoarthritis, left shoulder: Secondary | ICD-10-CM | POA: Diagnosis not present

## 2018-01-05 DIAGNOSIS — M1A9XX0 Chronic gout, unspecified, without tophus (tophi): Secondary | ICD-10-CM | POA: Diagnosis not present

## 2018-01-05 DIAGNOSIS — I700 Atherosclerosis of aorta: Secondary | ICD-10-CM | POA: Diagnosis not present

## 2018-01-05 DIAGNOSIS — M25561 Pain in right knee: Secondary | ICD-10-CM

## 2018-01-05 DIAGNOSIS — M51369 Other intervertebral disc degeneration, lumbar region without mention of lumbar back pain or lower extremity pain: Secondary | ICD-10-CM

## 2018-01-05 DIAGNOSIS — M25552 Pain in left hip: Secondary | ICD-10-CM | POA: Diagnosis not present

## 2018-01-05 DIAGNOSIS — R700 Elevated erythrocyte sedimentation rate: Secondary | ICD-10-CM

## 2018-01-05 DIAGNOSIS — N183 Chronic kidney disease, stage 3 (moderate): Secondary | ICD-10-CM | POA: Insufficient documentation

## 2018-01-05 DIAGNOSIS — J45990 Exercise induced bronchospasm: Secondary | ICD-10-CM | POA: Diagnosis not present

## 2018-01-05 DIAGNOSIS — M25511 Pain in right shoulder: Secondary | ICD-10-CM

## 2018-01-05 DIAGNOSIS — E538 Deficiency of other specified B group vitamins: Secondary | ICD-10-CM

## 2018-01-05 DIAGNOSIS — R7982 Elevated C-reactive protein (CRP): Secondary | ICD-10-CM

## 2018-01-05 MED ORDER — DIAZEPAM 5 MG PO TABS
5.0000 mg | ORAL_TABLET | ORAL | 0 refills | Status: DC | PRN
Start: 2018-01-05 — End: 2018-09-07

## 2018-01-05 NOTE — Progress Notes (Signed)
Safety precautions to be maintained throughout the outpatient stay will include: orient to surroundings, keep bed in low position, maintain call bell within reach at all times, provide assistance with transfer out of bed and ambulation.  

## 2018-01-05 NOTE — Progress Notes (Signed)
Patient's Name: Sabrina Green  MRN: [id number]  Referring Provider: Katheren Shams  DOB: 02/28/1946  PCP: Katheren Shams  DOS: 01/05/2018  Note by: Gaspar Cola, MD  Service setting: Ambulatory outpatient  Specialty: Interventional Pain Management  Location: [facility] (AMB) Pain Management Facility    Patient type: Established   Primary Reason(s) for Visit: Encounter for evaluation before starting new chronic pain management plan of care (Level of risk: moderate) CC: Back Pain (low) and Knee Pain (bilateral)  HPI  Sabrina Green is a 72 y.o. year old, female patient, who comes today for a follow-up evaluation to review the test results and decide on a treatment plan. She has BMI 40.0-44.9, adult ([place]); Shoulder bursitis (Right); Chronic gout; Chronic low back pain (Midline); CKD (chronic kidney disease) stage 3, GFR 30-59 ml/min (HCC); Elevated erythrocyte sedimentation rate; DDD (degenerative disc disease), lumbar; Encounter for long-term (current) use of high-risk medication; Essential hypertension; Exercise-induced asthma; Fatty liver disease, nonalcoholic; Fibromyalgia; GERD without esophagitis; IBS (irritable bowel syndrome); Long term current use of opiate analgesic; Osteoarthritis of knee (Left); Rheumatoid arthritis involving multiple sites [facility]); Vitamin B12 deficiency; Vitamin D deficiency; Chronic pain syndrome; Pharmacologic therapy; Disorder of skeletal system; Problems influencing health status; Chronic knee pain (Primary Area of Pain) (Bilateral) (L>R); Chronic low back pain (Secondary Area of Pain) (Bilateral) (L>R) w/ sciatica (Left); Chronic lower extremity pain (Tertiary Area of Pain) (Left); Chronic shoulder pain (Fourth Area of Pain) (Right); Chronic hip pain (Left); Elevated C-reactive protein (CRP); History of claustrophobia; History of panic attacks; Chronic pain of both shoulders; Primary osteoarthritis of both shoulders; and Primary osteoarthritis of knees, bilateral  on their problem list. Her primarily concern today is the Back Pain (low) and Knee Pain (bilateral)  Pain Assessment: Location: Lower Back Radiating: bilateral knees Onset: More than a month ago Duration: Chronic pain Quality: Constant, Heaviness Severity: 6 /10 (subjective, self-reported pain score)  Note: Reported level is inconsistent with clinical observations. Clinically the patient looks like a 2/10 A 2/10 is viewed as "Mild to Moderate" and described as noticeable and distracting. Impossible to hide from other people. More frequent flare-ups. Still possible to adapt and function close to normal. It can be very annoying and may have occasional stronger flare-ups. With discipline, patients may get used to it and adapt. Information on the proper use of the pain scale provided to the patient today. When using our objective Pain Scale, levels between 6 and 10/10 are said to belong in an emergency room, as it progressively worsens from a 6/10, described as severely limiting, requiring emergency care not usually available at an outpatient pain management facility. At a 6/10 level, communication becomes difficult and requires great effort. Assistance to reach the emergency department may be required. Facial flushing and profuse sweating along with potentially dangerous increases in heart rate and blood pressure will be evident. Timing: Constant Modifying factors: medications, movement, heat, ice, hot showers BP: 121/76  HR: 71  Sabrina Green comes in today for a follow-up visit after her initial evaluation on 12/11/2017. Today we went over the results of her tests. These were explained in "Layman's terms". During today's appointment we went over my diagnostic impression, as well as the proposed treatment plan.  According to the patient her primary area of pain is in her knees. She admits that the left is greater than the right (L>R). She denies any swelling, numbness or tingling  She admits that she does  have stiffness and occasional weakness with stepping off then curb.  She is s/p meniscus removal however cannot remember which knee (she thinks it was the left). She admits that she suffered a fall February 2019 had to get 5 stitches and 12 staples in her right knee. She admits that she had cortisone injections, last one about 3 months ago (done by her orthopedic MD, Dr. Ilona Sorrel, Dr. Mickie Kay, Rolena Infante, and Dr. Franchot Mimes). She and she failed synvisc. She states she was unable to tolerate, due to sweling and pain after procedure (in [place]). She moved to [facility] around June 2017. She had physical therapy 3-4 years ago and it may have effective. She has had some recent images.  Her second area of pain is in her lower back. She admits that it goes across her back (B) (R>L). She denies any previous surgeries. She did have epidural steroid x 2, by Dr. Sharlet Salina [facility]) (2018), described as effective. She has done some home physical therapy and has been seen by a chiropractor in the past. She has had images.  Her third area of pain is in her legs (B) (L>R) (She thinks is coming from her knees). She admits that the back pain does radiate down the left side of her leg into her ankle.  Her fourth area of pain is in her right shoulder (B) (R>L). She admits it does come and go. She admits that she has been told in the past that she had a rotator cuff tear. She went to physical therapy and this was effective for her shoulder.  In considering the treatment plan options, Ms. Bednarski was reminded that I no longer take patients for medication management only. I asked her to let me know if she had no intention of taking advantage of the interventional therapies, so that we could make arrangements to provide this space to someone interested. I also made it clear that undergoing interventional therapies for the purpose of getting pain medications is very inappropriate on the part of a patient, and it will not be tolerated in  this practice. This type of behavior would suggest true addiction and therefore it requires referral to an addiction specialist.   Further details on both, my assessment(s), as well as the proposed treatment plan, please see below.  Controlled Substance Pharmacotherapy Assessment REMS (Risk Evaluation and Mitigation Strategy)  Analgesic: none Highest recorded MME/day: 60 mg/day MME/day: 0 mg/day Pill Count: None expected due to no prior prescriptions written by our practice. Hart Rochester, RN  01/05/2018  9:49 AM  Sign at close encounter Safety precautions to be maintained throughout the outpatient stay will include: orient to surroundings, keep bed in low position, maintain call bell within reach at all times, provide assistance with transfer out of bed and ambulation.    Pharmacokinetics: Liberation and absorption (onset of action): WNL Distribution (time to peak effect): WNL Metabolism and excretion (duration of action): WNL         Pharmacodynamics: Desired effects: Analgesia: Ms. Hecht reports >50% benefit. Functional ability: Patient reports that medication allows her to accomplish basic ADLs Clinically meaningful improvement in function (CMIF): Sustained CMIF goals met Perceived effectiveness: Described as relatively effective, allowing for increase in activities of daily living (ADL) Undesirable effects: Side-effects or Adverse reactions: None reported Monitoring: [place] PMP: Online review of the past 40-monthperiod previously conducted. Not applicable at this point since we have not taken over the patient's medication management yet. List of all UDS test(s) done:  Lab Results  Component Value Date   SUMMARY FINAL 12/11/2017  Last UDS on record: Summary  Date Value Ref Range Status  12/11/2017 FINAL  Final    Comment:    ==================================================================== TOXASSURE COMP DRUG  ANALYSIS,UR ==================================================================== Test                             Result       Flag       Units Drug Present and Declared for Prescription Verification   Gabapentin                     PRESENT      EXPECTED   Propranolol                    PRESENT      EXPECTED Drug Present not Declared for Prescription Verification   Acetaminophen                  PRESENT      UNEXPECTED   Diphenhydramine                PRESENT      UNEXPECTED Drug Absent but Declared for Prescription Verification   Tizanidine                     Not Detected UNEXPECTED    Tizanidine, as indicated in the declared medication list, is not    always detected even when used as directed.   Sertraline                     Not Detected UNEXPECTED   Promethazine                   Not Detected UNEXPECTED ==================================================================== Test                      Result    Flag   Units      Ref Range   Creatinine              117              mg/dL      >=20 ==================================================================== Declared Medications:  The flagging and interpretation on this report are based on the  following declared medications.  Unexpected results may arise from  inaccuracies in the declared medications.  **Note: The testing scope of this panel includes these medications:  Gabapentin (Neurontin)  Promethazine (Phenergan)  Propranolol (Inderal)  Sertraline (Zoloft)  **Note: The testing scope of this panel does not include small to  moderate amounts of these reported medications:  Tizanidine (Zanaflex)  **Note: The testing scope of this panel does not include following  reported medications:  Allopurinol  Calcium (Calcium/Vitamin D)  Doxycycline (Vibramycin)  Estradiol (Estrace)  Fluticasone (Flonase)  Furosemide (Lasix)  Losartan (Cozaar)  Lubiprostone (Amitiza)  Methotrexate (Rheumatrex)  Mupirocin (Bactroban)   Omeprazole (Prilosec)  Vitamin D  Vitamin D (Calcium/Vitamin D) ==================================================================== For clinical consultation, please call [phone]. ====================================================================    UDS interpretation: Unexpected findings not considered significantly abnormal.          Medication Assessment Form: Not applicable. Treatment compliance: Not applicable Risk Assessment Profile: Aberrant behavior: See initial evaluations. None observed or detected today Comorbid factors increasing risk of overdose: See initial evaluation. No additional risks detected today Medical Psychology Evaluation: Please see scanned results in medical record. Opioid Risk Tool - 12/11/17 1033  Family History of Substance Abuse   Alcohol  Positive Female    Illegal Drugs  Negative    Rx Drugs  Negative      Personal History of Substance Abuse   Alcohol  Negative    Illegal Drugs  Negative    Rx Drugs  Negative      Age   Age between 63-45 years   No      Psychological Disease   Psychological Disease  Negative    Depression  Positive      Total Score   Opioid Risk Tool Scoring  2    Opioid Risk Interpretation  Low Risk      ORT Scoring interpretation table:  Score <3 = Low Risk for SUD  Score between 4-7 = Moderate Risk for SUD  Score >8 = High Risk for Opioid Abuse   Risk Mitigation Strategies:  Patient opioid safety counseling: Not applicable. Patient-Prescriber Agreement (PPA): No agreement signed.  Controlled substance notification to other providers: Not applicable  Pharmacologic Plan: No opioid analgesic prescribed.             Laboratory Chemistry  Inflammation Markers (CRP: Acute Phase) (ESR: Chronic Phase) Lab Results  Component Value Date   CRP 19.5 (H) 12/11/2017   ESRSEDRATE 90 (H) 12/11/2017               The combined elevation of both markers could suggest the possibility of an autoimmune  disease.  Rheumatology Markers Lab Results  Component Value Date   RF <10.0 12/11/2017   ANA Negative 12/11/2017                        Renal Function Markers Lab Results  Component Value Date   BUN 24 12/11/2017   CREATININE 1.54 (H) 12/11/2017   BCR 16 12/11/2017   GFRAA 39 (L) 12/11/2017   GFRNONAA 34 (L) 12/11/2017                              Hepatic Function Markers Lab Results  Component Value Date   AST 14 12/11/2017   ALBUMIN 3.9 12/11/2017   ALKPHOS 79 12/11/2017                        Electrolytes Lab Results  Component Value Date   NA 146 (H) 12/11/2017   K 4.5 12/11/2017   CL 104 12/11/2017   CALCIUM 9.4 12/11/2017   MG 1.7 12/11/2017                        Neuropathy Markers Lab Results  Component Value Date   VITAMINB12 358 12/11/2017                        Bone Pathology Markers Lab Results  Component Value Date   25OHVITD1 46 12/11/2017   25OHVITD2 <1.0 12/11/2017   25OHVITD3 46 12/11/2017                         Coagulation Parameters Lab Results  Component Value Date   PLT 132 (L) 09/07/2017                        Cardiovascular Markers Lab Results  Component Value Date   TROPONINI <0.03 09/07/2017   HGB 12.2  09/07/2017   HCT 36.7 09/07/2017                         Note: Lab results reviewed.  Recent Diagnostic Imaging Review   Complexity Note: No results found under the [facility] electronic medical record.                         Meds   Current Outpatient Medications:  .  allopurinol (ZYLOPRIM) 300 MG tablet, Take 300 mg by mouth daily., Disp: , Rfl:  .  calcium-vitamin D 250-100 MG-UNIT tablet, Take 1 tablet by mouth 2 (two) times daily., Disp: , Rfl:  .  cholecalciferol (VITAMIN D) 400 units TABS tablet, Take 2,000 Units by mouth. , Disp: , Rfl:  .  fluticasone (FLONASE) 50 MCG/ACT nasal spray, Place 2 sprays into both nostrils daily., Disp: , Rfl:  .  furosemide (LASIX) 10 MG/ML solution, Take by mouth daily.,  Disp: , Rfl:  .  gabapentin (NEURONTIN) 300 MG capsule, Take 300 mg by mouth 2 (two) times daily. , Disp: , Rfl:  .  losartan (COZAAR) 100 MG tablet, Take 100 mg by mouth daily., Disp: , Rfl:  .  lubiprostone (AMITIZA) 24 MCG capsule, Take 24 mcg by mouth 2 (two) times daily with a meal., Disp: , Rfl:  .  methotrexate (RHEUMATREX) 2.5 MG tablet, Take 7.5 mg by mouth 3 (three) times a week., Disp: , Rfl:  .  mupirocin ointment (BACTROBAN) 2 %, Apply 1 application topically 3 (three) times daily., Disp: 22 g, Rfl: 0 .  omeprazole (PRILOSEC) 20 MG capsule, Take 20 mg by mouth daily., Disp: , Rfl:  .  promethazine (PHENERGAN) 25 MG tablet, Take 25 mg by mouth every 6 (six) hours as needed for nausea or vomiting., Disp: , Rfl:  .  propranolol (INDERAL) 80 MG tablet, Take 80 mg by mouth daily., Disp: , Rfl:  .  sertraline (ZOLOFT) 100 MG tablet, Take 100 mg by mouth daily., Disp: , Rfl:  .  tiZANidine (ZANAFLEX) 4 MG capsule, Take 4 mg by mouth 2 (two) times daily as needed for muscle spasms., Disp: , Rfl:  .  diazepam (VALIUM) 5 MG tablet, Take 1 tablet (5 mg total) by mouth as needed for up to 2 doses for anxiety (Take one tab 45 minutes before MRI. Take second tablet just prior to MRI scan). Do not take medication within 4 hours of taking opioid pain medications. Must have a driver. Do not drive or operate machinery x 24 hours after taking this medication., Disp: 2 tablet, Rfl: 0  ROS  Constitutional: Denies any fever or chills Gastrointestinal: No reported hemesis, hematochezia, vomiting, or acute GI distress Musculoskeletal: Denies any acute onset joint swelling, redness, loss of ROM, or weakness Neurological: No reported episodes of acute onset apraxia, aphasia, dysarthria, agnosia, amnesia, paralysis, loss of coordination, or loss of consciousness  Allergies  Ms. Dillin is allergic to penicillins.  PFSH  Drug: Ms. Nodarse  reports that she does not use drugs. Alcohol:  reports that she does  not drink alcohol. Tobacco:  reports that she has never smoked. She has never used smokeless tobacco. Medical:  has a past medical history of Arthritis, Hypertension, and Rheumatoid arthritis ([place]). Surgical: Ms. Mccalla  has a past surgical history that includes Tonsillectomy; Cesarean section; Tubal ligation; Abdominal hysterectomy; Knee arthroscopy; Cholecystectomy; Abdominal hysterectomy; Ankle fracture surgery (Right, 1989); and Eye surgery (Bilateral, 2015). Family: family  history includes Breast cancer (age of onset: 46) in her other; Hypertension in her mother; Migraines in her mother; Other in her father.  Constitutional Exam  General appearance: Well nourished, well developed, and well hydrated. In no apparent acute distress Vitals:   01/05/18 0941  BP: 121/76  Pulse: 71  Resp: 18  Temp: 97.8 F (36.6 C)  TempSrc: Oral  SpO2: 99%  Weight: 250 lb (113.4 kg)  Height: _0  (1.651 m)   BMI Assessment: Estimated body mass index is 41.6 kg/m as calculated from the following:   Height as of this encounter: _1  (1.651 m).   Weight as of this encounter: 250 lb (113.4 kg).  BMI interpretation table: BMI level Category Range association with higher incidence of chronic pain  <18 kg/m2 Underweight   18.5-24.9 kg/m2 Ideal body weight   25-29.9 kg/m2 Overweight Increased incidence by 20%  30-34.9 kg/m2 Obese (Class I) Increased incidence by 68%  35-39.9 kg/m2 Severe obesity (Class II) Increased incidence by 136%  >40 kg/m2 Extreme obesity (Class III) Increased incidence by 254%   Patient's current BMI Ideal Body weight  Body mass index is 41.6 kg/m. Ideal body weight: 57 kg (125 lb 10.6 oz) Adjusted ideal body weight: 79.6 kg (175 lb 6.4 oz)   BMI Readings from Last 4 Encounters:  01/05/18 41.60 kg/m  12/11/17 40.94 kg/m  10/09/17 40.77 kg/m  09/29/17 40.77 kg/m   Wt Readings from Last 4 Encounters:  01/05/18 250 lb (113.4 kg)  12/11/17 246 lb (111.6 kg)  10/09/17  245 lb (111.1 kg)  09/29/17 245 lb (111.1 kg)  Psych/Mental status: Alert, oriented x 3 (person, place, & time)       Eyes: PERLA Respiratory: No evidence of acute respiratory distress  Cervical Spine Area Exam  Skin & Axial Inspection: No masses, redness, edema, swelling, or associated skin lesions Alignment: Symmetrical Functional ROM: Unrestricted ROM      Stability: No instability detected Muscle Tone/Strength: Functionally intact. No obvious neuro-muscular anomalies detected. Sensory (Neurological): Unimpaired Palpation: No palpable anomalies              Upper Extremity (UE) Exam    Side: Right upper extremity  Side: Left upper extremity  Skin & Extremity Inspection: Skin color, temperature, and hair growth are WNL. No peripheral edema or cyanosis. No masses, redness, swelling, asymmetry, or associated skin lesions. No contractures.  Skin & Extremity Inspection: Skin color, temperature, and hair growth are WNL. No peripheral edema or cyanosis. No masses, redness, swelling, asymmetry, or associated skin lesions. No contractures.  Functional ROM: Unrestricted ROM          Functional ROM: Unrestricted ROM          Muscle Tone/Strength: Functionally intact. No obvious neuro-muscular anomalies detected.  Muscle Tone/Strength: Functionally intact. No obvious neuro-muscular anomalies detected.  Sensory (Neurological): Unimpaired          Sensory (Neurological): Unimpaired          Palpation: No palpable anomalies              Palpation: No palpable anomalies              Provocative Test(s):  Phalen's test: deferred Tinel's test: deferred Apley's scratch test (touch opposite shoulder):  Action 1 (Across chest): deferred Action 2 (Overhead): deferred Action 3 (LB reach): deferred   Provocative Test(s):  Phalen's test: deferred Tinel's test: deferred Apley's scratch test (touch opposite shoulder):  Action 1 (Across chest): deferred Action 2 (Overhead): deferred  Action 3 (LB reach):  deferred    Thoracic Spine Area Exam  Skin & Axial Inspection: No masses, redness, or swelling Alignment: Symmetrical Functional ROM: Unrestricted ROM Stability: No instability detected Muscle Tone/Strength: Functionally intact. No obvious neuro-muscular anomalies detected. Sensory (Neurological): Unimpaired Muscle strength & Tone: No palpable anomalies  Lumbar Spine Area Exam  Skin & Axial Inspection: No masses, redness, or swelling Alignment: Symmetrical Functional ROM: Decreased ROM       Stability: No instability detected Muscle Tone/Strength: Functionally intact. No obvious neuro-muscular anomalies detected. Sensory (Neurological): Movement-associated pain Palpation: Complains of area being tender to palpation       Provocative Tests: Lumbar Hyperextension/rotation test: deferred today       Lumbar quadrant test (Kemp's test): deferred today       Lumbar Lateral bending test: deferred today       Patrick's Maneuver: (-)                   FABER test: Negative       Thigh-thrust test: deferred today       S-I compression test: deferred today       S-I distraction test: deferred today        Gait & Posture Assessment  Ambulation: Limited Gait: Antalgic gait (limping) Posture: Difficulty standing up straight, due to pain   Lower Extremity Exam    Side: Right lower extremity  Side: Left lower extremity  Stability: No instability observed          Stability: No instability observed          Skin & Extremity Inspection: Valgus knee deformity  Skin & Extremity Inspection: Valgus knee deformity  Functional ROM: Decreased ROM for knee joint          Functional ROM: Decreased ROM for knee joint          Muscle Tone/Strength: Functionally intact. No obvious neuro-muscular anomalies detected.  Muscle Tone/Strength: Functionally intact. No obvious neuro-muscular anomalies detected.  Sensory (Neurological): Unimpaired  Sensory (Neurological): Unimpaired  Palpation: No palpable  anomalies  Palpation: No palpable anomalies   Assessment & Plan  Primary Diagnosis & Pertinent Problem List: The primary encounter diagnosis was Chronic knee pain (Primary Area of Pain) (Bilateral) (L>R). Diagnoses of Chronic low back pain (Secondary Area of Pain) (Bilateral) (L>R) w/ sciatica (Left), Chronic lower extremity pain (Tertiary Area of Pain) (Left), Chronic shoulder pain (Fourth Area of Pain) (Right), Chronic hip pain (Left), Chronic low back pain (Midline), DDD (degenerative disc disease), lumbar, Osteoarthritis of knee (Left), Fibromyalgia, Pharmacologic therapy, Disorder of skeletal system, Problems influencing health status, Elevated C-reactive protein (CRP), Elevated erythrocyte sedimentation rate, Vitamin B12 deficiency, Vitamin D deficiency, Long term current use of opiate analgesic, BMI 40.0-44.9, adult (HCC), Chronic right shoulder pain (Fourth Area of Pain), Chronic pain syndrome, History of claustrophobia, History of panic attacks, Chronic pain of both shoulders, Primary osteoarthritis of both shoulders, and Primary osteoarthritis of knees, bilateral were also pertinent to this visit.  Visit Diagnosis: 1. Chronic knee pain (Primary Area of Pain) (Bilateral) (L>R)   2. Chronic low back pain (Secondary Area of Pain) (Bilateral) (L>R) w/ sciatica (Left)   3. Chronic lower extremity pain (Tertiary Area of Pain) (Left)   4. Chronic shoulder pain (Fourth Area of Pain) (Right)   5. Chronic hip pain (Left)   6. Chronic low back pain (Midline)   7. DDD (degenerative disc disease), lumbar   8. Osteoarthritis of knee (Left)   9. Fibromyalgia  10. Pharmacologic therapy   11. Disorder of skeletal system   12. Problems influencing health status   13. Elevated C-reactive protein (CRP)   14. Elevated erythrocyte sedimentation rate   15. Vitamin B12 deficiency   16. Vitamin D deficiency   17. Long term current use of opiate analgesic   18. BMI 40.0-44.9, adult ([place])   19. Chronic  right shoulder pain (Fourth Area of Pain)   20. Chronic pain syndrome   21. History of claustrophobia   22. History of panic attacks   23. Chronic pain of both shoulders   24. Primary osteoarthritis of both shoulders   25. Primary osteoarthritis of knees, bilateral    Problems updated and reviewed during this visit: Problem  Chronic Pain of Both Shoulders  Primary Osteoarthritis of Both Shoulders  Primary Osteoarthritis of Knees, Bilateral  Chronic Pain Syndrome  Chronic knee pain (Primary Area of Pain) (Bilateral) (L>R)  Chronic low back pain (Secondary Area of Pain) (Bilateral) (L>R) w/ sciatica (Left)  Chronic lower extremity pain (Tertiary Area of Pain) (Left)  Chronic shoulder pain (Fourth Area of Pain) (Right)  Chronic hip pain (Left)  Ddd (Degenerative Disc Disease), Lumbar  Chronic low back pain (Midline)  Osteoarthritis of knee (Left)  Shoulder bursitis (Right)  Chronic gout  Fibromyalgia  Rheumatoid Arthritis Involving Multiple Sites (H[facility])  History of Claustrophobia  History of Panic Attacks  Elevated C-Reactive Protein (Crp)  Pharmacologic Therapy  Disorder of Skeletal System  Problems Influencing Health Status  Fatty Liver Disease, Nonalcoholic   Abstracted from prior records from [place] provider   Ckd (Chronic Kidney Disease) Stage 3, Gfr 30-59 Ml/Min (Hcc)  Elevated Erythrocyte Sedimentation Rate  Encounter for Long-Term (Current) Use of High-Risk Medication  Long Term Current Use of Opiate Analgesic  Vitamin B12 Deficiency  Vitamin D Deficiency  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 Chipley, was spent in counseling/coordination of care regarding: the appropriate use of the pain scale, Ms. Runyan primary cause of pain, the results of her recent test(s), the significance of  each one oth the test(s) anomalies and it's corresponding characteristic pain pattern(s), the treatment plan, treatment alternatives, realistic expectations and the need to collect and read the AVS material. Plan of Care  Pharmacotherapy (Medications Ordered): Meds ordered this encounter  Medications  . diazepam (VALIUM) 5 MG tablet    Sig: Take 1 tablet (5 mg total) by mouth as needed for up to 2 doses for anxiety (Take one tab 45 minutes before MRI. Take second tablet just prior to MRI scan). Do not take medication within 4 hours of taking opioid pain medications. Must have a driver. Do not drive or operate machinery x 24 hours after taking this medication.    Dispense:  2 tablet    Refill:  0    Must have a driver. Do not drive or operate machinery x 24 hours after taking this medication.    Procedure Orders     KNEE INJECTION Lab Orders  No laboratory test(s) ordered today    Imaging Orders     DG Lumbar Spine Complete W/Bend     DG Shoulder Left     DG Knee 1-2 Views Right     DG Knee 1-2 Views Left     MR KNEE RIGHT WO CONTRAST     MR KNEE  LEFT WO CONTRAST Referral Orders  No referral(s) requested today    Pharmacological management options:  Opioid Analgesics: I will not be prescribing any opioids at this time Membrane stabilizer: We have discussed the possibility of optimizing this mode of therapy, if tolerated Muscle relaxant: We have discussed the possibility of a trial NSAID: We have discussed the possibility of a trial Other analgesic(s): To be determined at a later time   Interventional management options: Planned, scheduled, and/or pending:    Diagnostic bilateral intra-articular knee injection #1 under fluoroscopic guidance, no sedation   Considering:   Diagnostic bilateral intra-articular knee injection  Diagnostic bilateral Hyalgan series  Diagnostic bilateral genicular nerve blocks  Possible  bilateral genicular nerve RFA  Diagnostic left side LESI   Diagnostic bilateral lumbar facet nerve block  Diagnostic bilateral lumbar facet RFA  Diagnostic right shoulder intra-articular joint injection  Diagnostic right suprascapular nerve block  Possible right suprascapular RFA    PRN Procedures:   None at this time   Provider-requested follow-up: Return for Procedure (no sedation): (B) IA Knee inj (w/ Fluoro).  Future Appointments  Date Time Provider Welcome  01/15/2018 12:30 PM Milinda Pointer, MD [facility] None  01/21/2018 10:00 AM [facility]  01/21/2018 11:00 AM [facility]    Primary Care Physician: Katheren Shams Location: [facility] Note by: Gaspar Cola, MD Date: 01/05/2018; Time: 1:12 PM

## 2018-01-05 NOTE — Patient Instructions (Addendum)
____________________________________________________________________________________________  Pain Scale  Introduction: The pain score used by this practice is the Verbal Numerical Rating Scale (VNRS-11). This is an 11-point scale. It is for adults and children 10 years or older. There are significant differences in how the pain score is reported, used, and applied. Forget everything you learned in the past and learn this scoring system.  General Information: The scale should reflect your current level of pain. Unless you are specifically asked for the level of your worst pain, or your average pain. If you are asked for one of these two, then it should be understood that it is over the past 24 hours.  Basic Activities of Daily Living (ADL): Personal hygiene, dressing, eating, transferring, and using restroom.  Instructions: Most patients tend to report their level of pain as a combination of two factors, their physical pain and their psychosocial pain. This last one is also known as "suffering" and it is reflection of how physical pain affects you socially and psychologically. From now on, report them separately. From this point on, when asked to report your pain level, report only your physical pain. Use the following table for reference.  Pain Clinic Pain Levels (0-5/10)  Pain Level Score  Description  No Pain 0   Mild pain 1 Nagging, annoying, but does not interfere with basic activities of daily living (ADL). Patients are able to eat, bathe, get dressed, toileting (being able to get on and off the toilet and perform personal hygiene functions), transfer (move in and out of bed or a chair without assistance), and maintain continence (able to control bladder and bowel functions). Blood pressure and heart rate are unaffected. A normal heart rate for a healthy adult ranges from 60 to 100 bpm (beats per minute).   Mild to moderate pain 2 Noticeable and distracting. Impossible to hide from other  people. More frequent flare-ups. Still possible to adapt and function close to normal. It can be very annoying and may have occasional stronger flare-ups. With discipline, patients may get used to it and adapt.   Moderate pain 3 Interferes significantly with activities of daily living (ADL). It becomes difficult to feed, bathe, get dressed, get on and off the toilet or to perform personal hygiene functions. Difficult to get in and out of bed or a chair without assistance. Very distracting. With effort, it can be ignored when deeply involved in activities.   Moderately severe pain 4 Impossible to ignore for more than a few minutes. With effort, patients may still be able to manage work or participate in some social activities. Very difficult to concentrate. Signs of autonomic nervous system discharge are evident: dilated pupils (mydriasis); mild sweating (diaphoresis); sleep interference. Heart rate becomes elevated (>115 bpm). Diastolic blood pressure (lower number) rises above 100 mmHg. Patients find relief in laying down and not moving.   Severe pain 5 Intense and extremely unpleasant. Associated with frowning face and frequent crying. Pain overwhelms the senses.  Ability to do any activity or maintain social relationships becomes significantly limited. Conversation becomes difficult. Pacing back and forth is common, as getting into a comfortable position is nearly impossible. Pain wakes you up from deep sleep. Physical signs will be obvious: pupillary dilation; increased sweating; goosebumps; brisk reflexes; cold, clammy hands and feet; nausea, vomiting or dry heaves; loss of appetite; significant sleep disturbance with inability to fall asleep or to remain asleep. When persistent, significant weight loss is observed due to the complete loss of appetite and sleep deprivation.  Blood   pressure and heart rate becomes significantly elevated. Caution: If elevated blood pressure triggers a pounding headache  associated with blurred vision, then the patient should immediately seek attention at an urgent or emergency care unit, as these may be signs of an impending stroke.    Emergency Department Pain Levels (6-10/10)  Emergency Room Pain 6 Severely limiting. Requires emergency care and should not be seen or managed at an outpatient pain management facility. Communication becomes difficult and requires great effort. Assistance to reach the emergency department may be required. Facial flushing and profuse sweating along with potentially dangerous increases in heart rate and blood pressure will be evident.   Distressing pain 7 Self-care is very difficult. Assistance is required to transport, or use restroom. Assistance to reach the emergency department will be required. Tasks requiring coordination, such as bathing and getting dressed become very difficult.   Disabling pain 8 Self-care is no longer possible. At this level, pain is disabling. The individual is unable to do even the most "basic" activities such as walking, eating, bathing, dressing, transferring to a bed, or toileting. Fine motor skills are lost. It is difficult to think clearly.   Incapacitating pain 9 Pain becomes incapacitating. Thought processing is no longer possible. Difficult to remember your own name. Control of movement and coordination are lost.   The worst pain imaginable 10 At this level, most patients pass out from pain. When this level is reached, collapse of the autonomic nervous system occurs, leading to a sudden drop in blood pressure and heart rate. This in turn results in a temporary and dramatic drop in blood flow to the brain, leading to a loss of consciousness. Fainting is one of the body's self defense mechanisms. Passing out puts the brain in a calmed state and causes it to shut down for a while, in order to begin the healing process.    Summary: 1. Refer to this scale when providing [place] with your pain level. 2. Be  accurate and careful when reporting your pain level. This will help with your care. 3. Over-reporting your pain level will lead to loss of credibility. 4. Even a level of 1/10 means that there is pain and will be treated at our facility. 5. High, inaccurate reporting will be documented as "Symptom Exaggeration", leading to loss of credibility and suspicions of possible secondary gains such as obtaining more narcotics, or wanting to appear disabled, for fraudulent reasons. 6. Only pain levels of 5 or below will be seen at our facility. 7. Pain levels of 6 and above will be sent to the Emergency Department and the appointment cancelled. ____________________________________________________________________________________________   ____________________________________________________________________________________________  Preparing for your procedure (without sedation)  Instructions: . Oral Intake: Do not eat or drink anything for at least 3 hours prior to your procedure. . Transportation: Unless otherwise stated by your physician, you may drive yourself after the procedure. . Blood Pressure Medicine: Take your blood pressure medicine with a sip of water the morning of the procedure. . Blood thinners:  . Diabetics on insulin: Notify the staff so that you can be scheduled 1st case in the morning. If your diabetes requires high dose insulin, take only  of your normal insulin dose the morning of the procedure and notify the staff that you have done so. . Preventing infections: Shower with an antibacterial soap the morning of your procedure.  . Build-up your immune system: Take 1000 mg of Vitamin C with every meal (3 times a day) the day prior to  your procedure. Marland Kitchen Antibiotics: Inform the staff if you have a condition or reason that requires you to take antibiotics before dental procedures. . Pregnancy: If you are pregnant, call and cancel the procedure. . Sickness: If you have a cold, fever, or any  active infections, call and cancel the procedure. . Arrival: You must be in the facility at least 30 minutes prior to your scheduled procedure. . Children: Do not bring any children with you. . Dress appropriately: Bring dark clothing that you would not mind if they get stained. . Valuables: Do not bring any jewelry or valuables.  Procedure appointments are reserved for interventional treatments only. Marland Kitchen No Prescription Refills. . No medication changes will be discussed during procedure appointments. . No disability issues will be discussed.  Remember:  Regular Business hours are:  Monday to Thursday 8:00 AM to 4:00 PM  Provider's Schedule: Milinda Pointer, MD:  Procedure days: Tuesday and Thursday 7:30 AM to 4:00 PM  Gillis Santa, MD:  Procedure days: Monday and Wednesday 7:30 AM to 4:00 PM ____________________________________________________________________________________________    Knee Injection A knee injection is a procedure to get medicine into your knee joint. Your health care provider puts a needle into the joint and injects medicine with an attached syringe. The injected medicine may relieve the pain, swelling, and stiffness of arthritis. The injected medicine may also help to lubricate and cushion your knee joint. You may need more than one injection. Tell a health care provider about:  Any allergies you have.  All medicines you are taking, including vitamins, herbs, eye drops, creams, and over-the-counter medicines.  Any problems you or family members have had with anesthetic medicines.  Any blood disorders you have.  Any surgeries you have had.  Any medical conditions you have. What are the risks? Generally, this is a safe procedure. However, problems may occur, including:  Infection.  Bleeding.  Worsening symptoms.  Damage to the area around your knee.  Allergic reaction to any of the medicines.  Skin reactions from repeated injections.  What  happens before the procedure?  Ask your health care provider about changing or stopping your regular medicines. This is especially important if you are taking diabetes medicines or blood thinners.  Plan to have someone take you home after the procedure. What happens during the procedure?  You will sit or lie down in a position for your knee to be treated.  The skin over your kneecap will be cleaned with a germ-killing solution (antiseptic).  You will be given a medicine that numbs the area (local anesthetic). You may feel some stinging.  After your knee becomes numb, you will have a second injection. This is the medicine. This needle is carefully placed between your kneecap and your knee. The medicine is injected into the joint space.  At the end of the procedure, the needle will be removed.  A bandage (dressing) may be placed over the injection site. The procedure may vary among health care providers and hospitals. What happens after the procedure?  You may have to move your knee through its full range of motion. This helps to get all of the medicine into your joint space.  Your blood pressure, heart rate, breathing rate, and blood oxygen level will be monitored often until the medicines you were given have worn off.  You will be watched to make sure that you do not have a reaction to the injected medicine. This information is not intended to replace advice given to you by  your health care provider. Make sure you discuss any questions you have with your health care provider. Document Released: 10/06/2006 Document Revised: 12/15/2015 Document Reviewed: 05/25/2014 Elsevier Interactive Patient Education  2018 [place]  What are the risk, side effects and possible complications? Generally speaking, most procedures are safe.  However, with any procedure there are risks, side effects, and the possibility of complications.  The risks and complications  are dependent upon the sites that are lesioned, or the type of nerve block to be performed.  The closer the procedure is to the spine, the more serious the risks are.  Great care is taken when placing the radio frequency needles, block needles or lesioning probes, but sometimes complications can occur. 1. Infection: Any time there is an injection through the skin, there is a risk of infection.  This is why sterile conditions are used for these blocks.  There are four possible types of infection. 1. Localized skin infection. 2. Central Nervous System Infection-This can be in the form of Meningitis, which can be deadly. 3. Epidural Infections-This can be in the form of an epidural abscess, which can cause pressure inside of the spine, causing compression of the spinal cord with subsequent paralysis. This would require an emergency surgery to decompress, and there are no guarantees that the patient would recover from the paralysis. 4. Discitis-This is an infection of the intervertebral discs.  It occurs in about 1% of discography procedures.  It is difficult to treat and it may lead to surgery.        2. Pain: the needles have to go through skin and soft tissues, will cause soreness.       3. Damage to internal structures:  The nerves to be lesioned may be near blood vessels or    other nerves which can be potentially damaged.       4. Bleeding: Bleeding is more common if the patient is taking blood thinners such as  aspirin, Coumadin, Ticiid, Plavix, etc., or if he/she have some genetic predisposition  such as hemophilia. Bleeding into the spinal canal can cause compression of the spinal  cord with subsequent paralysis.  This would require an emergency surgery to  decompress and there are no guarantees that the patient would recover from the  paralysis.       5. Pneumothorax:  Puncturing of a lung is a possibility, every time a needle is introduced in  the area of the chest or upper back.  Pneumothorax  refers to free air around the  collapsed lung(s), inside of the thoracic cavity (chest cavity).  Another two possible  complications related to a similar event would include: Hemothorax and Chylothorax.   These are variations of the Pneumothorax, where instead of air around the collapsed  lung(s), you may have blood or chyle, respectively.       6. Spinal headaches: They may occur with any procedures in the area of the spine.       7. Persistent CSF (Cerebro-Spinal Fluid) leakage: This is a rare problem, but may occur  with prolonged intrathecal or epidural catheters either due to the formation of a fistulous  track or a dural tear.       8. Nerve damage: By working so close to the spinal cord, there is always a possibility of  nerve damage, which could be as serious as a permanent spinal cord injury with  paralysis.       9. Death:  Although  rare, severe deadly allergic reactions known as "Anaphylactic  reaction" can occur to any of the medications used.      10. Worsening of the symptoms:  We can always make thing worse.  What are the chances of something like this happening? Chances of any of this occuring are extremely low.  By statistics, you have more of a chance of getting killed in a motor vehicle accident: while driving to the hospital than any of the above occurring .  Nevertheless, you should be aware that they are possibilities.  In general, it is similar to taking a shower.  Everybody knows that you can slip, hit your head and get killed.  Does that mean that you should not shower again?  Nevertheless always keep in mind that statistics do not mean anything if you happen to be on the wrong side of them.  Even if a procedure has a 1 (one) in a 1,000,000 (million) chance of going wrong, it you happen to be that one..Also, keep in mind that by statistics, you have more of a chance of having something go wrong when taking medications.  Who should not have this procedure? If you are on a blood  thinning medication (e.g. Coumadin, Plavix, see list of "Blood Thinners"), or if you have an active infection going on, you should not have the procedure.  If you are taking any blood thinners, please inform your physician.  How should I prepare for this procedure?  Do not eat or drink anything at least six hours prior to the procedure.  Bring a driver with you .  It cannot be a taxi.  Come accompanied by an adult that can drive you back, and that is strong enough to help you if your legs get weak or numb from the local anesthetic.  Take all of your medicines the morning of the procedure with just enough water to swallow them.  If you have diabetes, make sure that you are scheduled to have your procedure done first thing in the morning, whenever possible.  If you have diabetes, take only half of your insulin dose and notify our nurse that you have done so as soon as you arrive at the clinic.  If you are diabetic, but only take blood sugar pills (oral hypoglycemic), then do not take them on the morning of your procedure.  You may take them after you have had the procedure.  Do not take aspirin or any aspirin-containing medications, at least eleven (11) days prior to the procedure.  They may prolong bleeding.  Wear loose fitting clothing that may be easy to take off and that you would not mind if it got stained with Betadine or blood.  Do not wear any jewelry or perfume  Remove any nail coloring.  It will interfere with some of our monitoring equipment.  NOTE: Remember that this is not meant to be interpreted as a complete list of all possible complications.  Unforeseen problems may occur.  BLOOD THINNERS The following drugs contain aspirin or other products, which can cause increased bleeding during surgery and should not be taken for 2 weeks prior to and 1 week after surgery.  If you should need take something for relief of minor pain, you may take acetaminophen which is found in  Tylenol,m Datril, Anacin-3 and Panadol. It is not blood thinner. The products listed below are.  Do not take any of the products listed below in addition to any listed on your instruction sheet.  A.P.C or A.P.C with Codeine Codeine Phosphate Capsules #3 Ibuprofen Ridaura  ABC compound Congesprin Imuran rimadil  Advil Cope Indocin Robaxisal  Alka-Seltzer Effervescent Pain Reliever and Antacid Coricidin or Coricidin-D  Indomethacin Rufen  Alka-Seltzer plus Cold Medicine Cosprin Ketoprofen S-A-C Tablets  Anacin Analgesic Tablets or Capsules Coumadin Korlgesic Salflex  Anacin Extra Strength Analgesic tablets or capsules CP-2 Tablets Lanoril Salicylate  Anaprox Cuprimine Capsules Levenox Salocol  Anexsia-D Dalteparin Magan Salsalate  Anodynos Darvon compound Magnesium Salicylate Sine-off  Ansaid Dasin Capsules Magsal Sodium Salicylate  Anturane Depen Capsules Marnal Soma  APF Arthritis pain formula Dewitt's Pills Measurin Stanback  Argesic Dia-Gesic Meclofenamic Sulfinpyrazone  Arthritis Bayer Timed Release Aspirin Diclofenac Meclomen Sulindac  Arthritis pain formula Anacin Dicumarol Medipren Supac  Analgesic (Safety coated) Arthralgen Diffunasal Mefanamic Suprofen  Arthritis Strength Bufferin Dihydrocodeine Mepro Compound Suprol  Arthropan liquid Dopirydamole Methcarbomol with Aspirin Synalgos  ASA tablets/Enseals Disalcid Micrainin Tagament  Ascriptin Doan's Midol Talwin  Ascriptin A/D Dolene Mobidin Tanderil  Ascriptin Extra Strength Dolobid Moblgesic Ticlid  Ascriptin with Codeine Doloprin or Doloprin with Codeine Momentum Tolectin  Asperbuf Duoprin Mono-gesic Trendar  Aspergum Duradyne Motrin or Motrin IB Triminicin  Aspirin plain, buffered or enteric coated Durasal Myochrisine Trigesic  Aspirin Suppositories Easprin Nalfon Trillsate  Aspirin with Codeine Ecotrin Regular or Extra Strength Naprosyn Uracel  Atromid-S Efficin Naproxen Ursinus  Auranofin Capsules Elmiron Neocylate  Vanquish  Axotal Emagrin Norgesic Verin  Azathioprine Empirin or Empirin with Codeine Normiflo Vitamin E  Azolid Emprazil Nuprin Voltaren  Bayer Aspirin plain, buffered or children's or timed BC Tablets or powders Encaprin Orgaran Warfarin Sodium  Buff-a-Comp Enoxaparin Orudis Zorpin  Buff-a-Comp with Codeine Equegesic Os-Cal-Gesic   Buffaprin Excedrin plain, buffered or Extra Strength Oxalid   Bufferin Arthritis Strength Feldene Oxphenbutazone   Bufferin plain or Extra Strength Feldene Capsules Oxycodone with Aspirin   Bufferin with Codeine Fenoprofen Fenoprofen Pabalate or Pabalate-SF   Buffets II Flogesic Panagesic   Buffinol plain or Extra Strength Florinal or Florinal with Codeine Panwarfarin   Buf-Tabs Flurbiprofen Penicillamine   Butalbital Compound Four-way cold tablets Penicillin   Butazolidin Fragmin Pepto-Bismol   Carbenicillin Geminisyn Percodan   Carna Arthritis Reliever Geopen Persantine   Carprofen Gold's salt Persistin   Chloramphenicol Goody's Phenylbutazone   Chloromycetin Haltrain Piroxlcam   Clmetidine heparin Plaquenil   Cllnoril Hyco-pap Ponstel   Clofibrate Hydroxy chloroquine Propoxyphen         Before stopping any of these medications, be sure to consult the physician who ordered them.  Some, such as Coumadin (Warfarin) are ordered to prevent or treat serious conditions such as "deep thrombosis", "pumonary embolisms", and other heart problems.  The amount of time that you may need off of the medication may also vary with the medication and the reason for which you were taking it.  If you are taking any of these medications, please make sure you notify your pain physician before you undergo any procedures.

## 2018-01-15 ENCOUNTER — Ambulatory Visit (HOSPITAL_BASED_OUTPATIENT_CLINIC_OR_DEPARTMENT_OTHER): Payer: Medicare Other | Admitting: Pain Medicine

## 2018-01-15 ENCOUNTER — Encounter: Payer: Self-pay | Admitting: Pain Medicine

## 2018-01-15 ENCOUNTER — Ambulatory Visit
Admission: RE | Admit: 2018-01-15 | Discharge: 2018-01-15 | Disposition: A | Discharge status: Home / Self Care | Payer: Medicare Other | Source: Ambulatory Visit | Attending: Pain Medicine | Admitting: Pain Medicine

## 2018-01-15 ENCOUNTER — Other Ambulatory Visit: Payer: Self-pay

## 2018-01-15 VITALS — BP 133/57 | HR 61 | Temp 98.1°F | Resp 18 | Ht 65.0 in | Wt 246.0 lb

## 2018-01-15 DIAGNOSIS — M25562 Pain in left knee: Secondary | ICD-10-CM

## 2018-01-15 DIAGNOSIS — G8929 Other chronic pain: Secondary | ICD-10-CM | POA: Insufficient documentation

## 2018-01-15 DIAGNOSIS — M170 Bilateral primary osteoarthritis of knee: Secondary | ICD-10-CM | POA: Diagnosis not present

## 2018-01-15 DIAGNOSIS — Z6841 Body Mass Index (BMI) 40.0 and over, adult: Secondary | ICD-10-CM

## 2018-01-15 DIAGNOSIS — M25561 Pain in right knee: Secondary | ICD-10-CM

## 2018-01-15 MED ORDER — LIDOCAINE HCL (PF) 1 % IJ SOLN
5.0000 mL | Freq: Once | INTRAMUSCULAR | Status: AC
  Administered 2018-01-15: 5 mL
  Filled 2018-01-15: qty 5

## 2018-01-15 MED ORDER — METHYLPREDNISOLONE ACETATE 40 MG/ML IJ SUSP
40.0000 mg | Freq: Once | INTRAMUSCULAR | Status: AC
  Administered 2018-01-15: 40 mg via INTRA_ARTICULAR
  Filled 2018-01-15: qty 1

## 2018-01-15 MED ORDER — LIDOCAINE HCL 2 % IJ SOLN
20.0000 mL | Freq: Once | INTRAMUSCULAR | Status: AC
  Administered 2018-01-15: 400 mg
  Filled 2018-01-15: qty 40

## 2018-01-15 MED ORDER — METHYLPREDNISOLONE ACETATE 40 MG/ML IJ SUSP
40.0000 mg | Freq: Once | INTRAMUSCULAR | Status: AC
  Administered 2018-01-15: 40 mg
  Filled 2018-01-15: qty 1

## 2018-01-15 MED ORDER — ROPIVACAINE HCL 2 MG/ML IJ SOLN
2.0000 mL | Freq: Once | INTRAMUSCULAR | Status: AC
  Administered 2018-01-15: 10 mL via INTRA_ARTICULAR
  Filled 2018-01-15: qty 10

## 2018-01-15 NOTE — Progress Notes (Signed)
Patient's Name: Sabrina Green  MRN: [id number]  Referring Provider: Katheren Shams  DOB: 15-Aug-1945  PCP: Katheren Shams  DOS: 01/15/2018  Note by: Gaspar Cola, MD  Service setting: Ambulatory outpatient  Specialty: Interventional Pain Management  Patient type: Established  Location: [facility] (AMB) Pain Management Facility  Visit type: Interventional Procedure   Primary Reason for Visit: Interventional Pain Management Treatment. CC: Knee Injury (bilateral)  Procedure:          Anesthesia, Analgesia, Anxiolysis:  Type: Diagnostic Intra-Articular Local anesthetic and steroid Knee Injection #1  Region: Lateral infrapatellar Knee Region Level: Knee Joint Laterality: Bilateral  Type: Local Anesthesia Indication(s): Analgesia         Local Anesthetic: Lidocaine 1-2% Route: Infiltration ([place]/IM) IV Access: Declined Sedation: Declined    Indications: 1. Osteoarthritis of knees (Bilateral)   2. Chronic knee pain (Primary Area of Pain) (Bilateral) (L>R)   3. BMI 40.0-44.9, adult (HCC)    Pain Score: Pre-procedure: 3 /10 Post-procedure: 0-No pain/10  Pre-op Assessment:  Sabrina Green is a 72 y.o. (year old), female patient, seen today for interventional treatment. She  has a past surgical history that includes Tonsillectomy; Cesarean section; Tubal ligation; Abdominal hysterectomy; Knee arthroscopy; Cholecystectomy; Abdominal hysterectomy; Ankle fracture surgery (Right, 1989); and Eye surgery (Bilateral, 2015). Sabrina Green has a current medication list which includes the following prescription(s): allopurinol, calcium-vitamin d, cholecalciferol, diazepam, fluticasone, furosemide, gabapentin, losartan, lubiprostone, methotrexate, mupirocin ointment, omeprazole, promethazine, propranolol, sertraline, and tizanidine. Her primarily concern today is the Knee Injury (bilateral)  Initial Vital Signs:  Pulse/HCG Rate: 65  Temp: 98.1 F (36.7 C) Resp: 16 BP: (!) 141/54 SpO2: 100  %  BMI: Estimated body mass index is 40.94 kg/m as calculated from the following:   Height as of this encounter: 5\' 5"  (1.651 m).   Weight as of this encounter: 246 lb (111.6 kg).  Risk Assessment: Allergies: Reviewed. She is allergic to penicillins.  Allergy Precautions: None required Coagulopathies: Reviewed. None identified.  Blood-thinner therapy: None at this time Active Infection(s): Reviewed. None identified. Sabrina Green is afebrile  Site Confirmation: Sabrina Green was asked to confirm the procedure and laterality before marking the site Procedure checklist: Completed Consent: Before the procedure and under the influence of no sedative(s), amnesic(s), or anxiolytics, the patient was informed of the treatment options, risks and possible complications. To fulfill our ethical and legal obligations, as recommended by the American Medical Association's Code of Ethics, I have informed the patient of my clinical impression; the nature and purpose of the treatment or procedure; the risks, benefits, and possible complications of the intervention; the alternatives, including doing nothing; the risk(s) and benefit(s) of the alternative treatment(s) or procedure(s); and the risk(s) and benefit(s) of doing nothing. The patient was provided information about the general risks and possible complications associated with the procedure. These may include, but are not limited to: failure to achieve desired goals, infection, bleeding, organ or nerve damage, allergic reactions, paralysis, and death. In addition, the patient was informed of those risks and complications associated to the procedure, such as failure to decrease pain; infection; bleeding; organ or nerve damage with subsequent damage to sensory, motor, and/or autonomic systems, resulting in permanent pain, numbness, and/or weakness of one or several areas of the body; allergic reactions; (i.e.: anaphylactic reaction); and/or death. Furthermore, the  patient was informed of those risks and complications associated with the medications. These include, but are not limited to: allergic reactions (i.e.: anaphylactic or anaphylactoid reaction(s)); adrenal axis suppression; blood sugar  elevation that in diabetics may result in ketoacidosis or comma; water retention that in patients with history of congestive heart failure may result in shortness of breath, pulmonary edema, and decompensation with resultant heart failure; weight gain; swelling or edema; medication-induced neural toxicity; particulate matter embolism and blood vessel occlusion with resultant organ, and/or nervous system infarction; and/or aseptic necrosis of one or more joints. Finally, the patient was informed that Medicine is not an exact science; therefore, there is also the possibility of unforeseen or unpredictable risks and/or possible complications that may result in a catastrophic outcome. The patient indicated having understood very clearly. We have given the patient no guarantees and we have made no promises. Enough time was given to the patient to ask questions, all of which were answered to the patient's satisfaction. Sabrina Green has indicated that she wanted to continue with the procedure. Attestation: I, the ordering provider, attest that I have discussed with the patient the benefits, risks, side-effects, alternatives, likelihood of achieving goals, and potential problems during recovery for the procedure that I have provided informed consent. Date  Time: 01/15/2018 12:12 PM  Pre-Procedure Preparation:  Monitoring: As per clinic protocol. Respiration, ETCO2, SpO2, BP, heart rate and rhythm monitor placed and checked for adequate function Safety Precautions: Patient was assessed for positional comfort and pressure points before starting the procedure. Time-out: I initiated and conducted the "Time-out" before starting the procedure, as per protocol. The patient was asked to  participate by confirming the accuracy of the "Time Out" information. Verification of the correct person, site, and procedure were performed and confirmed by me, the nursing staff, and the patient. "Time-out" conducted as per Joint Commission's Universal Protocol (UP.01.01.01). Time: 1307  Description of Procedure:          Position: Sitting Target Area: Knee Joint Approach: Just above the Lateral tibial plateau, lateral to the infrapatellar tendon. Area Prepped: Entire knee area, from the mid-thigh to the mid-shin. Prepping solution: ChloraPrep (2% chlorhexidine gluconate and 70% isopropyl alcohol) Safety Precautions: Aspiration looking for blood return was conducted prior to all injections. At no point did we inject any substances, as a needle was being advanced. No attempts were made at seeking any paresthesias. Safe injection practices and needle disposal techniques used. Medications properly checked for expiration dates. SDV (single dose vial) medications used. Description of the Procedure: Protocol guidelines were followed. The patient was placed in position over the fluoroscopy table. The target area was identified and the area prepped in the usual manner. Skin & deeper tissues infiltrated with local anesthetic. Appropriate amount of time allowed to pass for local anesthetics to take effect. The procedure needles were then advanced to the target area. Proper needle placement secured. Negative aspiration confirmed. Solution injected in intermittent fashion, asking for systemic symptoms every 0.5cc of injectate. The needles were then removed and the area cleansed, making sure to leave some of the prepping solution back to take advantage of its long term bactericidal properties. Vitals:   01/15/18 1253 01/15/18 1300 01/15/18 1310 01/15/18 1315  BP: (!) 150/70 134/74 138/66 (!) 133/57  Pulse: 63 64 63 61  Resp: 16 15 20 18   Temp:      SpO2: 100% 98% 100% 95%  Weight:      Height:        Start  Time: 1307 hrs. End Time: 1315 hrs. Materials:  Needle(s) Type: Regular needle Gauge: 22G Length: 3.5-in Medication(s): Please see orders for medications and dosing details.  Imaging Guidance:  Type of Imaging Technique: Fluoroscopy Guidance (Non-spinal) Indication(s): Assistance in needle guidance and placement for procedures requiring needle placement in or near specific anatomical locations not easily accessible without such assistance. Exposure Time: Please see nurses notes. Contrast: None used. Fluoroscopic Guidance: I was personally present during the use of fluoroscopy. "Tunnel Vision Technique" used to obtain the best possible view of the target area. Parallax error corrected before commencing the procedure. "Direction-depth-direction" technique used to introduce the needle under continuous pulsed fluoroscopy. Once target was reached, antero-posterior, oblique, and lateral fluoroscopic projection used confirm needle placement in all planes. Images permanently stored in EMR. Ultrasound Guidance: N/A Interpretation: No contrast injected. I personally interpreted the imaging intraoperatively. Adequate needle placement confirmed in multiple planes. Permanent images saved into the patient's record.  Antibiotic Prophylaxis:   Anti-infectives (From admission, onward)   None     Indication(s): None identified  Post-operative Assessment:  Post-procedure Vital Signs:  Pulse/HCG Rate: 61  Temp: 98.1 F (36.7 C) Resp: 18 BP: (!) 133/57 SpO2: 95 %  EBL: None  Complications: No immediate post-treatment complications observed by team, or reported by patient.  Note: The patient tolerated the entire procedure well. A repeat set of vitals were taken after the procedure and the patient was kept under observation following institutional policy, for this type of procedure. Post-procedural neurological assessment was performed, showing return to baseline, prior to discharge. The  patient was provided with post-procedure discharge instructions, including a section on how to identify potential problems. Should any problems arise concerning this procedure, the patient was given instructions to immediately contact us, at any time, without hesitation. In any case, we plan to contact the patient by telephone for a follow-up status report regarding this interventional procedure.  Comments:  No additional relevant information.  Plan of Care    Imaging Orders     DG C-Arm 1-60 Min-No Report  Procedure Orders     KNEE INJECTION  Medications ordered for procedure: Meds ordered this encounter  Medications  . lidocaine (XYLOCAINE) 2 % (with pres) injection 400 mg  . lidocaine (PF) (XYLOCAINE) 1 % injection 5 mL  . ropivacaine (PF) 2 mg/mL (0.2%) (NAROPIN) injection 2 mL  . methylPREDNISolone acetate (DEPO-MEDROL) injection 40 mg  . methylPREDNISolone acetate (DEPO-MEDROL) injection 40 mg   Medications administered: We administered lidocaine, lidocaine (PF), ropivacaine (PF) 2 mg/mL (0.2%), methylPREDNISolone acetate, and methylPREDNISolone acetate.  See the medical record for exact dosing, route, and time of administration.  New Prescriptions   No medications on file   Disposition: Discharge home  Discharge Date & Time: 01/15/2018; 1332 hrs.   Physician-requested Follow-up: Return for post-procedure eval (2 wks), w/ Dr. Dossie Arbour.  Future Appointments  Date Time Provider [place]  01/21/2018 10:00 AM [facility] [address]  01/21/2018 11:00 AM [facility]  02/04/2018 10:45 AM Milinda Pointer, MD [facility] None   Primary Care Physician: Katheren Shams Location: [facility] Note by: Gaspar Cola, MD Date: 01/15/2018; Time: 1:57 PM  Disclaimer:  Medicine is not an Chief Strategy Officer. The only guarantee in medicine is that nothing is guaranteed. It is important to note that the decision to proceed with this  intervention was based on the information collected from the patient. The Data and conclusions were drawn from the patient's questionnaire, the interview, and the physical examination. Because the information was provided in large part by the patient, it cannot be guaranteed that it has not been purposely or unconsciously manipulated. Every effort has  been made to obtain as much relevant data as possible for this evaluation. It is important to note that the conclusions that lead to this procedure are derived in large part from the available data. Always take into account that the treatment will also be dependent on availability of resources and existing treatment guidelines, considered by other Pain Management Practitioners as being common knowledge and practice, at the time of the intervention. For Medico-Legal purposes, it is also important to point out that variation in procedural techniques and pharmacological choices are the acceptable norm. The indications, contraindications, technique, and results of the above procedure should only be interpreted and judged by a Board-Certified Interventional Pain Specialist with extensive familiarity and expertise in the same exact procedure and technique.

## 2018-01-15 NOTE — Patient Instructions (Addendum)

## 2018-01-16 ENCOUNTER — Telehealth: Payer: Self-pay | Admitting: *Deleted

## 2018-01-16 NOTE — Telephone Encounter (Signed)
No problems post procedure. 

## 2018-01-21 ENCOUNTER — Ambulatory Visit
Admission: RE | Admit: 2018-01-21 | Discharge: 2018-01-21 | Disposition: A | Discharge status: Home / Self Care | Payer: Medicare Other | Source: Ambulatory Visit | Attending: Pain Medicine | Admitting: Pain Medicine

## 2018-01-21 DIAGNOSIS — S83241A Other tear of medial meniscus, current injury, right knee, initial encounter: Secondary | ICD-10-CM | POA: Diagnosis not present

## 2018-01-21 DIAGNOSIS — S83242A Other tear of medial meniscus, current injury, left knee, initial encounter: Secondary | ICD-10-CM | POA: Insufficient documentation

## 2018-01-21 DIAGNOSIS — M25561 Pain in right knee: Secondary | ICD-10-CM | POA: Insufficient documentation

## 2018-01-21 DIAGNOSIS — M25562 Pain in left knee: Secondary | ICD-10-CM | POA: Diagnosis not present

## 2018-01-21 DIAGNOSIS — X58XXXA Exposure to other specified factors, initial encounter: Secondary | ICD-10-CM | POA: Diagnosis not present

## 2018-01-21 DIAGNOSIS — M170 Bilateral primary osteoarthritis of knee: Secondary | ICD-10-CM

## 2018-01-21 DIAGNOSIS — G8929 Other chronic pain: Secondary | ICD-10-CM

## 2018-02-04 ENCOUNTER — Other Ambulatory Visit: Payer: Self-pay

## 2018-02-04 ENCOUNTER — Encounter: Payer: Self-pay | Admitting: Pain Medicine

## 2018-02-04 ENCOUNTER — Ambulatory Visit: Payer: Medicare Other | Attending: Pain Medicine | Admitting: Pain Medicine

## 2018-02-04 VITALS — BP 106/48 | HR 55 | Temp 97.6°F | Ht 65.0 in | Wt 250.0 lb

## 2018-02-04 DIAGNOSIS — M545 Low back pain, unspecified: Secondary | ICD-10-CM

## 2018-02-04 DIAGNOSIS — G8929 Other chronic pain: Secondary | ICD-10-CM | POA: Diagnosis not present

## 2018-02-04 DIAGNOSIS — M25561 Pain in right knee: Secondary | ICD-10-CM

## 2018-02-04 DIAGNOSIS — N183 Chronic kidney disease, stage 3 (moderate): Secondary | ICD-10-CM | POA: Insufficient documentation

## 2018-02-04 DIAGNOSIS — M109 Gout, unspecified: Secondary | ICD-10-CM | POA: Diagnosis not present

## 2018-02-04 DIAGNOSIS — G894 Chronic pain syndrome: Secondary | ICD-10-CM | POA: Diagnosis not present

## 2018-02-04 DIAGNOSIS — M170 Bilateral primary osteoarthritis of knee: Secondary | ICD-10-CM

## 2018-02-04 DIAGNOSIS — Z880 Allergy status to penicillin: Secondary | ICD-10-CM | POA: Diagnosis not present

## 2018-02-04 DIAGNOSIS — M47817 Spondylosis without myelopathy or radiculopathy, lumbosacral region: Secondary | ICD-10-CM

## 2018-02-04 DIAGNOSIS — M797 Fibromyalgia: Secondary | ICD-10-CM | POA: Diagnosis not present

## 2018-02-04 DIAGNOSIS — K219 Gastro-esophageal reflux disease without esophagitis: Secondary | ICD-10-CM | POA: Diagnosis not present

## 2018-02-04 DIAGNOSIS — M069 Rheumatoid arthritis, unspecified: Secondary | ICD-10-CM | POA: Insufficient documentation

## 2018-02-04 DIAGNOSIS — I129 Hypertensive chronic kidney disease with stage 1 through stage 4 chronic kidney disease, or unspecified chronic kidney disease: Secondary | ICD-10-CM | POA: Insufficient documentation

## 2018-02-04 DIAGNOSIS — M25562 Pain in left knee: Secondary | ICD-10-CM

## 2018-02-04 DIAGNOSIS — M47816 Spondylosis without myelopathy or radiculopathy, lumbar region: Secondary | ICD-10-CM | POA: Insufficient documentation

## 2018-02-04 DIAGNOSIS — M5136 Other intervertebral disc degeneration, lumbar region: Secondary | ICD-10-CM | POA: Diagnosis not present

## 2018-02-04 DIAGNOSIS — Z79899 Other long term (current) drug therapy: Secondary | ICD-10-CM | POA: Diagnosis not present

## 2018-02-04 NOTE — Progress Notes (Signed)
Patient's Name: Sabrina Green  MRN: [id number]  Referring Provider: Katheren Green  DOB: 1946-02-18  PCP: Sabrina Aly, FNP  DOS: 02/04/2018  Note by: Gaspar Cola, MD  Service setting: Ambulatory outpatient  Specialty: Interventional Pain Management  Location: [facility] (AMB) Pain Management Facility    Patient type: Established   Primary Reason(s) for Visit: Encounter for post-procedure evaluation of chronic illness with mild to moderate exacerbation CC: Knee Pain (no pain today)  HPI  Sabrina Green is a 72 y.o. year old, female patient, who comes today for a post-procedure evaluation. She has BMI 40.0-44.9, adult ([place]); Shoulder bursitis (Right); Chronic gout; Chronic low back pain (Midline); CKD (chronic kidney disease) stage 3, GFR 30-59 ml/min (HCC); Elevated erythrocyte sedimentation rate; DDD (degenerative disc disease), lumbar; Encounter for long-term (current) use of high-risk medication; Essential hypertension; Exercise-induced asthma; Fatty liver disease, nonalcoholic; Fibromyalgia; GERD without esophagitis; IBS (irritable bowel syndrome); Long term current use of opiate analgesic; Osteoarthritis of knee (Left); Rheumatoid arthritis involving multiple sites [facility]); Vitamin B12 deficiency; Vitamin D deficiency; Chronic pain syndrome; Pharmacologic therapy; Disorder of skeletal system; Problems influencing health status; Chronic knee pain (Primary Area of Pain) (Bilateral) (L>R); Chronic lower extremity pain (Tertiary Area of Pain) (Left); Chronic shoulder pain (Fourth Area of Pain) (Right); Chronic hip pain (Left); Elevated C-reactive protein (CRP); History of claustrophobia; History of panic attacks; Chronic pain of both shoulders; Primary osteoarthritis of both shoulders; Osteoarthritis of knees (Bilateral); Tricompartmental disease of knee (Bilateral); Chronic low back pain (Secondary Area of Pain) (Bilateral) (L>R); Lumbar facet syndrome (Bilateral) (L>R); and Spondylosis without  myelopathy or radiculopathy, lumbosacral region on their problem list. Her primarily concern today is the Knee Pain (no pain today)  Pain Assessment: Location: Right, Left Knee Radiating: denies Onset: More than a month ago Duration: Chronic pain Severity: 0-No pain/10 (subjective, self-reported pain score)  Note: Reported level is compatible with observation.                               Timing: Constant Modifying factors: medications BP: (!) 106/48  HR: (!) 55  Sabrina Green comes in today for post-procedure evaluation after the treatment done on 01/16/2018. The patient indicates that her knee pain is doing much better, however it is now her lower back that is worse than requires attention. We will go ahead and schedule her to come back for a diagnostic bilateral lumbar facet block under fluoroscopic guidance and IV sedation. All of her symptoms associated to osteoarthritis secondary to her morbid obesity. Today we spoke about that and hopefully she will begin to work on her part.  Further details on both, my assessment(s), as well as the proposed treatment plan, please see below.  Post-Procedure Assessment  01/15/2018 Procedure: Diagnostic bilateral intra-articular knee injection #1 under fluoroscopic guidance, no sedation Pre-procedure pain score:  3/10 Post-procedure pain score: 0/10 (100% relief) Influential Factors: BMI: 41.60 kg/m Intra-procedural challenges: None observed.         Assessment challenges: None detected.              Reported side-effects: None.        Post-procedural adverse reactions or complications: None reported         Sedation: No sedation used. When no sedatives are used, the analgesic levels obtained are directly associated to the effectiveness of the local anesthetics. However, when sedation is provided, the level of analgesia obtained during the initial 1 hour following the  intervention, is believed to be the result of a combination of factors. These  factors may include, but are not limited to: 1. The effectiveness of the local anesthetics used. 2. The effects of the analgesic(s) and/or anxiolytic(s) used. 3. The degree of discomfort experienced by the patient at the time of the procedure. 4. The patients ability and reliability in recalling and recording the events. 5. The presence and influence of possible secondary gains and/or psychosocial factors. Reported result: Relief experienced during the 1st hour after the procedure: 100 % (Ultra-Short Term Relief)            Interpretative annotation: Clinically appropriate result. No IV Analgesic or Anxiolytic given, therefore benefits are completely due to Local Anesthetic effects.          Effects of local anesthetic: The analgesic effects attained during this period are directly associated to the localized infiltration of local anesthetics and therefore cary significant diagnostic value as to the etiological location, or anatomical origin, of the pain. Expected duration of relief is directly dependent on the pharmacodynamics of the local anesthetic used. Long-acting (4-6 hours) anesthetics used.  Reported result: Relief during the next 4 to 6 hour after the procedure: 100 % (Short-Term Relief)            Interpretative annotation: Clinically appropriate result. Analgesia during this period is likely to be Local Anesthetic-related.          Long-term benefit: Defined as the period of time past the expected duration of local anesthetics (1 hour for short-acting and 4-6 hours for long-acting). With the possible exception of prolonged sympathetic blockade from the local anesthetics, benefits during this period are typically attributed to, or associated with, other factors such as analgesic sensory neuropraxia, antiinflammatory effects, or beneficial biochemical changes provided by agents other than the local anesthetics.  Reported result: Extended relief following procedure: 100 % (Long-Term Relief)             Interpretative annotation: Clinically appropriate result. Good relief. Therapeutic success. Inflammation plays a part in the etiology to the pain.          Current benefits: Defined as reported results that persistent at this point in time.   Analgesia: 100 % Ms. Mainwaring reports improvement of arthralgia. Function: Ms. Landa reports improvement in function ROM: Ms. Twichell reports improvement in ROM Interpretative annotation: Ongoing benefit. Therapeutic success. Effective therapeutic approach. Benefit could be steroid-related.  Interpretation: Results would suggest a successful diagnostic intervention.                  Plan:  Set up procedure as a PRN palliative treatment option for this patient. Today we spoke to the patient about a possible series of 5 intra-articular Hyalgan knee injections. However, since he is doing well, we'll put that as a prn. At this point, the patient's worst pain is that of her lower back and she would like for [place] to work on that. We will be bringing her back for a diagnostic bilateral lumbar facet block under fluoroscopic guidance and IV sedation.  Laboratory Chemistry  Inflammation Markers (CRP: Acute Phase) (ESR: Chronic Phase) Lab Results  Component Value Date   CRP 19.5 (H) 12/11/2017   ESRSEDRATE 90 (H) 12/11/2017                         Renal Markers Lab Results  Component Value Date   BUN 24 12/11/2017   CREATININE 1.54 (H) 12/11/2017  BCR 16 12/11/2017   GFRAA 39 (L) 12/11/2017   GFRNONAA 34 (L) 12/11/2017                             Hepatic Markers Lab Results  Component Value Date   AST 14 12/11/2017   ALBUMIN 3.9 12/11/2017                        Neuropathy Markers No results found for: HGBA1C, HIV                      Hematology Parameters Lab Results  Component Value Date   PLT 132 (L) 09/07/2017   HGB 12.2 09/07/2017   HCT 36.7 09/07/2017                        CV Markers Lab Results  Component Value Date    TROPONINI <0.03 09/07/2017                         Note: Lab results reviewed.  Recent Diagnostic Imaging Results  MR KNEE RIGHT WO CONTRAST CLINICAL DATA:  Go UD focal low with the third the ED chronic generalized knee pain for 15-20 years.  EXAM: MRI OF THE RIGHT KNEE WITHOUT CONTRAST  TECHNIQUE: Multiplanar, multisequence MR imaging of the knee was performed. No intravenous contrast was administered.  COMPARISON:  None.  FINDINGS: MENISCI  Medial meniscus: Oblique tear of the posterior horn of the medial meniscus extending to the inferior articular surface (image 24/series 7). Degeneration of posterior horn of the medial meniscus.  Lateral meniscus:  Maceration of the lateral meniscus.  LIGAMENTS  Cruciates: Intact ACL which is increased in signal and expanded consistent with mucinous degeneration. Intact PCL.  Collaterals: Medial collateral ligament is intact. Lateral collateral ligament complex is intact.  CARTILAGE  Patellofemoral: Partial-thickness cartilage loss of the patellofemoral compartment with areas of high-grade partial-thickness cartilage loss of the patellar apex and lateral patellar facet.  Medial: Mild partial-thickness cartilage loss of the medial femorotibial compartment.  Lateral: Extensive full-thickness cartilage loss of lateral femoral condyle and lateral tibial plateau with mild subchondral reactive marrow changes and marginal osteophytes.  Joint: Small joint effusion. Normal Hoffa's fat. No plical thickening.  Popliteal Fossa: No significant Baker cyst. Intact popliteus tendon.  Extensor Mechanism: Intact quadriceps tendon. Intact patellar tendon. Intact medial patellar retinaculum. Intact lateral patellar retinaculum. Intact MPFL.  Bones:  No acute osseous abnormality.  No aggressive osseous lesion.  Other: Mild generalized muscle atrophy.  IMPRESSION: 1. Tricompartmental cartilage abnormalities as described above. 2.  Maceration of the lateral meniscus. 3. Oblique tear of the posterior horn of the medial meniscus extending to the inferior articular surface.  Electronically Signed   By: Kathreen Devoid   On: 01/21/2018 13:27 MR KNEE LEFT WO CONTRAST CLINICAL DATA:  Chronic generalized left knee pain for 15-16 years  EXAM: MRI OF THE LEFT KNEE WITHOUT CONTRAST  TECHNIQUE: Multiplanar, multisequence MR imaging of the knee was performed. No intravenous contrast was administered.  COMPARISON:  None.  FINDINGS: MENISCI  Medial meniscus: Increased signal in the body and posterior horn of the medial meniscus consistent with degeneration. Fraying along the free edge of the posterior horn of the medial meniscus. Small undersurface tear at the posterior horn-body junction of medial meniscus.  Lateral meniscus:  Maceration of the lateral meniscus.  LIGAMENTS  Cruciates:  Chronic complete ACL tear.  Intact PCL.  Collaterals: Medial collateral ligament is intact. Lateral collateral ligament complex is intact.  CARTILAGE  Patellofemoral: Partial-thickness cartilage loss of patellofemoral compartment.  Medial: Mild partial-thickness cartilage loss of the medial femorotibial compartment.  Lateral: Extensive full-thickness cartilage loss of lateral femorotibial compartment with mild subchondral reactive marrow changes and marginal osteophytes.  Joint: Small joint effusion. Normal Hoffa's fat. No plical thickening.  Popliteal Fossa: Small multiloculated Baker's cyst. Intact popliteus tendon.  Extensor Mechanism: Intact quadriceps tendon. Intact patellar tendon. Intact medial patellar retinaculum. Intact lateral patellar retinaculum. Intact MPFL.  Bones: No acute osseous abnormality. No aggressive osseous lesion. Mild osteoarthritis of the proximal tibiofibular joint.  Other: Mild generalized muscle atrophy.  IMPRESSION: 1. Tricompartmental cartilage abnormalities as described above. 2.  Increased signal in the body and posterior horn of the medial meniscus consistent with degeneration. Fraying along the free edge of the posterior horn of the medial meniscus. Small undersurface tear at the posterior horn-body junction of medial meniscus. 3. Maceration of the lateral meniscus.  Electronically Signed   By: Kathreen Devoid   On: 01/21/2018 13:25  Complexity Note: Imaging results reviewed. Results shared with Ms. Common, using Layman's terms.        Copy of results provided to patient.           Meds   Current Outpatient Medications:  .  allopurinol (ZYLOPRIM) 300 MG tablet, Take 300 mg by mouth daily., Disp: , Rfl:  .  calcium-vitamin D 250-100 MG-UNIT tablet, Take 1 tablet by mouth 2 (two) times daily., Disp: , Rfl:  .  cholecalciferol (VITAMIN D) 400 units TABS tablet, Take 2,000 Units by mouth. , Disp: , Rfl:  .  diazepam (VALIUM) 5 MG tablet, Take 1 tablet (5 mg total) by mouth as needed for up to 2 doses for anxiety (Take one tab 45 minutes before MRI. Take second tablet just prior to MRI scan). Do not take medication within 4 hours of taking opioid pain medications. Must have a driver. Do not drive or operate machinery x 24 hours after taking this medication., Disp: 2 tablet, Rfl: 0 .  fluticasone (FLONASE) 50 MCG/ACT nasal spray, Place 2 sprays into both nostrils daily., Disp: , Rfl:  .  furosemide (LASIX) 10 MG/ML solution, Take by mouth daily., Disp: , Rfl:  .  gabapentin (NEURONTIN) 300 MG capsule, Take 300 mg by mouth 2 (two) times daily. , Disp: , Rfl:  .  losartan (COZAAR) 100 MG tablet, Take 100 mg by mouth daily., Disp: , Rfl:  .  lubiprostone (AMITIZA) 24 MCG capsule, Take 24 mcg by mouth 2 (two) times daily with a meal., Disp: , Rfl:  .  methotrexate (RHEUMATREX) 2.5 MG tablet, Take 7.5 mg by mouth 3 (three) times a week., Disp: , Rfl:  .  mupirocin ointment (BACTROBAN) 2 %, Apply 1 application topically 3 (three) times daily., Disp: 22 g, Rfl: 0 .  omeprazole  (PRILOSEC) 20 MG capsule, Take 20 mg by mouth daily., Disp: , Rfl:  .  promethazine (PHENERGAN) 25 MG tablet, Take 25 mg by mouth every 6 (six) hours as needed for nausea or vomiting., Disp: , Rfl:  .  propranolol (INDERAL) 80 MG tablet, Take 80 mg by mouth daily., Disp: , Rfl:  .  sertraline (ZOLOFT) 100 MG tablet, Take 100 mg by mouth daily., Disp: , Rfl:  .  tiZANidine (ZANAFLEX) 4 MG capsule, Take 4 mg by mouth 2 (two) times daily  as needed for muscle spasms., Disp: , Rfl:   ROS  Constitutional: Denies any fever or chills Gastrointestinal: No reported hemesis, hematochezia, vomiting, or acute GI distress Musculoskeletal: Denies any acute onset joint swelling, redness, loss of ROM, or weakness Neurological: No reported episodes of acute onset apraxia, aphasia, dysarthria, agnosia, amnesia, paralysis, loss of coordination, or loss of consciousness  Allergies  Ms. Hoshino is allergic to penicillins.  PFSH  Drug: Ms. Colquhoun  reports that she does not use drugs. Alcohol:  reports that she does not drink alcohol. Tobacco:  reports that she has never smoked. She has never used smokeless tobacco. Medical:  has a past medical history of Arthritis, Hypertension, and Rheumatoid arthritis ([place]). Surgical: Ms. Wallner  has a past surgical history that includes Tonsillectomy; Cesarean section; Tubal ligation; Abdominal hysterectomy; Knee arthroscopy; Cholecystectomy; Abdominal hysterectomy; Ankle fracture surgery (Right, 1989); and Eye surgery (Bilateral, 2015). Family: family history includes Breast cancer (age of onset: 106) in her other; Hypertension in her mother; Migraines in her mother; Other in her father.  Constitutional Exam  General appearance: Well nourished, well developed, and well hydrated. In no apparent acute distress Vitals:   02/04/18 1101  BP: (!) 106/48  Pulse: (!) 55  Temp: 97.6 F (36.4 C)  SpO2: 100%  Weight: 250 lb (113.4 kg)  Height: 5' 5"  (1.651 m)   BMI Assessment:  Estimated body mass index is 41.6 kg/m as calculated from the following:   Height as of this encounter: 5' 5"  (1.651 m).   Weight as of this encounter: 250 lb (113.4 kg).  BMI interpretation table: BMI level Category Range association with higher incidence of chronic pain  <18 kg/m2 Underweight   18.5-24.9 kg/m2 Ideal body weight   25-29.9 kg/m2 Overweight Increased incidence by 20%  30-34.9 kg/m2 Obese (Class I) Increased incidence by 68%  35-39.9 kg/m2 Severe obesity (Class II) Increased incidence by 136%  >40 kg/m2 Extreme obesity (Class III) Increased incidence by 254%   Patient's current BMI Ideal Body weight  Body mass index is 41.6 kg/m. Ideal body weight: 57 kg (125 lb 10.6 oz) Adjusted ideal body weight: 79.6 kg (175 lb 6.4 oz)   BMI Readings from Last 4 Encounters:  02/04/18 41.60 kg/m  01/15/18 40.94 kg/m  01/05/18 41.60 kg/m  12/11/17 40.94 kg/m   Wt Readings from Last 4 Encounters:  02/04/18 250 lb (113.4 kg)  01/15/18 246 lb (111.6 kg)  01/05/18 250 lb (113.4 kg)  12/11/17 246 lb (111.6 kg)  Psych/Mental status: Alert, oriented x 3 (person, place, & time)       Eyes: PERLA Respiratory: No evidence of acute respiratory distress  Cervical Spine Area Exam  Skin & Axial Inspection: No masses, redness, edema, swelling, or associated skin lesions Alignment: Symmetrical Functional ROM: Unrestricted ROM      Stability: No instability detected Muscle Tone/Strength: Functionally intact. No obvious neuro-muscular anomalies detected. Sensory (Neurological): Unimpaired Palpation: No palpable anomalies              Upper Extremity (UE) Exam    Side: Right upper extremity  Side: Left upper extremity  Skin & Extremity Inspection: Skin color, temperature, and hair growth are WNL. No peripheral edema or cyanosis. No masses, redness, swelling, asymmetry, or associated skin lesions. No contractures.  Skin & Extremity Inspection: Skin color, temperature, and hair growth are  WNL. No peripheral edema or cyanosis. No masses, redness, swelling, asymmetry, or associated skin lesions. No contractures.  Functional ROM: Unrestricted ROM  Functional ROM: Unrestricted ROM          Muscle Tone/Strength: Functionally intact. No obvious neuro-muscular anomalies detected.  Muscle Tone/Strength: Functionally intact. No obvious neuro-muscular anomalies detected.  Sensory (Neurological): Unimpaired          Sensory (Neurological): Unimpaired          Palpation: No palpable anomalies              Palpation: No palpable anomalies              Provocative Test(s):  Phalen's test: deferred Tinel's test: deferred Apley's scratch test (touch opposite shoulder):  Action 1 (Across chest): deferred Action 2 (Overhead): deferred Action 3 (LB reach): deferred   Provocative Test(s):  Phalen's test: deferred Tinel's test: deferred Apley's scratch test (touch opposite shoulder):  Action 1 (Across chest): deferred Action 2 (Overhead): deferred Action 3 (LB reach): deferred    Thoracic Spine Area Exam  Skin & Axial Inspection: No masses, redness, or swelling Alignment: Symmetrical Functional ROM: Unrestricted ROM Stability: No instability detected Muscle Tone/Strength: Functionally intact. No obvious neuro-muscular anomalies detected. Sensory (Neurological): Unimpaired Muscle strength & Tone: No palpable anomalies  Lumbar Spine Area Exam  Skin & Axial Inspection: No masses, redness, or swelling Alignment: Symmetrical Functional ROM: Decreased ROM affecting both sides Stability: No instability detected Muscle Tone/Strength: Functionally intact. No obvious neuro-muscular anomalies detected. Sensory (Neurological): Movement-associated pain Palpation: Complains of area being tender to palpation       Provocative Tests: Lumbar Hyperextension/rotation test: (+) bilaterally for facet joint pain. Lumbar quadrant test (Kemp's test): (+) bilaterally for facet joint pain. Lumbar  Lateral bending test: deferred today       Patrick's Maneuver: deferred today                   FABER test: deferred today       Thigh-thrust test: deferred today       S-I compression test: deferred today       S-I distraction test: deferred today        Gait & Posture Assessment  Ambulation: Patient ambulates using a cane Gait: Modified gait pattern (slower gait speed, wider stride width, and longer stance duration) associated with morbid obesity Posture: Antalgic   Lower Extremity Exam    Side: Right lower extremity  Side: Left lower extremity  Stability: No instability observed          Stability: No instability observed          Skin & Extremity Inspection: Skin color, temperature, and hair growth are WNL. No peripheral edema or cyanosis. No masses, redness, swelling, asymmetry, or associated skin lesions. No contractures.  Skin & Extremity Inspection: Skin color, temperature, and hair growth are WNL. No peripheral edema or cyanosis. No masses, redness, swelling, asymmetry, or associated skin lesions. No contractures.  Functional ROM: Unrestricted ROM                  Functional ROM: Unrestricted ROM                  Muscle Tone/Strength: Functionally intact. No obvious neuro-muscular anomalies detected.  Muscle Tone/Strength: Functionally intact. No obvious neuro-muscular anomalies detected.  Sensory (Neurological): Unimpaired  Sensory (Neurological): Unimpaired  Palpation: No palpable anomalies  Palpation: No palpable anomalies   Assessment  Primary Diagnosis & Pertinent Problem List: The primary encounter diagnosis was Chronic knee pain (Primary Area of Pain) (Bilateral) (L>R). Diagnoses of Osteoarthritis of knees (Bilateral), Tricompartment osteoarthritis of both  knees, Chronic low back pain (Secondary Area of Pain) (Bilateral) (L>R), Lumbar facet syndrome (Bilateral) (L>R), and Spondylosis without myelopathy or radiculopathy, lumbosacral region were also pertinent to this  visit.  Status Diagnosis  Resolved Worsening Worsening 1. Chronic knee pain (Primary Area of Pain) (Bilateral) (L>R)   2. Osteoarthritis of knees (Bilateral)   3. Tricompartment osteoarthritis of both knees   4. Chronic low back pain (Secondary Area of Pain) (Bilateral) (L>R)   5. Lumbar facet syndrome (Bilateral) (L>R)   6. Spondylosis without myelopathy or radiculopathy, lumbosacral region     Problems updated and reviewed during this visit: No problems updated. Plan of Care  Pharmacotherapy (Medications Ordered): No orders of the defined types were placed in this encounter.  Medications administered today: Blanch Media A. Boer had no medications administered during this visit.   Procedure Orders     KNEE INJECTION     LUMBAR FACET(MEDIAL BRANCH NERVE BLOCK) [facility] Lab Orders  No laboratory test(s) ordered today   Imaging Orders  No imaging studies ordered today    Referral Orders     Ambulatory referral to Orthopedic Surgery  Interventional management options: Planned, scheduled, and/or pending:   Diagnosticbilateral lumbar facet nerve block #1 under fluoro and IV sedation   Considering:   Diagnostic bilateral intra-articular knee injection  Diagnostic bilateral Hyalgan series  Diagnosticbilateral genicular nerve blocks  Possible bilateral genicular nerve RFA  Diagnosticleft side LESI  Diagnosticbilateral lumbar facet nerve block  Diagnostic bilateral lumbar facet RFA  Diagnostic right shoulder intra-articular joint injection  Diagnosticright suprascapular nerve block  Possible right suprascapular RFA    Palliative PRN treatment(s):   Therapeutic bilateral intra-articular, series of 5, Hyalgan knee injections under fluoroscopic guidance, no sedation    Provider-requested follow-up: Return for Procedure (w/ sedation): (B) L-FCT BLK #1.  Future Appointments  Date Time Provider [place]  02/17/2018  9:45 AM Milinda Pointer, MD [facility] None    Primary Care Physician: Sabrina Aly, FNP Location: [facility] Outpatient Pain Management Facility Note by: Gaspar Cola, MD Date: 02/04/2018; Time: 12:40 PM

## 2018-02-04 NOTE — Patient Instructions (Signed)
____________________________________________________________________________________________  Preparing for Procedure with Sedation  Instructions: . Oral Intake: Do not eat or drink anything for at least 8 hours prior to your procedure. . Transportation: Public transportation is not allowed. Bring an adult driver. The driver must be physically present in our waiting room before any procedure can be started. . Physical Assistance: Bring an adult physically capable of assisting you, in the event you need help. This adult should keep you company at home for at least 6 hours after the procedure. . Blood Pressure Medicine: Take your blood pressure medicine with a sip of water the morning of the procedure. . Blood thinners:  . Diabetics on insulin: Notify the staff so that you can be scheduled 1st case in the morning. If your diabetes requires high dose insulin, take only  of your normal insulin dose the morning of the procedure and notify the staff that you have done so. . Preventing infections: Shower with an antibacterial soap the morning of your procedure. . Build-up your immune system: Take 1000 mg of Vitamin C with every meal (3 times a day) the day prior to your procedure. . Antibiotics: Inform the staff if you have a condition or reason that requires you to take antibiotics before dental procedures. . Pregnancy: If you are pregnant, call and cancel the procedure. . Sickness: If you have a cold, fever, or any active infections, call and cancel the procedure. . Arrival: You must be in the facility at least 30 minutes prior to your scheduled procedure. . Children: Do not bring children with you. . Dress appropriately: Bring dark clothing that you would not mind if they get stained. . Valuables: Do not bring any jewelry or valuables.  Procedure appointments are reserved for interventional treatments only. . No Prescription Refills. . No medication changes will be discussed during procedure  appointments. . No disability issues will be discussed.  Remember:  Regular Business hours are:  Monday to Thursday 8:00 AM to 4:00 PM  Provider's Schedule: Geoffrey Mankin, MD:  Procedure days: Tuesday and Thursday 7:30 AM to 4:00 PM  Bilal Lateef, MD:  Procedure days: Monday and Wednesday 7:30 AM to 4:00 PM ____________________________________________________________________________________________    

## 2018-02-17 ENCOUNTER — Other Ambulatory Visit: Payer: Self-pay

## 2018-02-17 ENCOUNTER — Encounter: Payer: Self-pay | Admitting: Pain Medicine

## 2018-02-17 ENCOUNTER — Ambulatory Visit (HOSPITAL_BASED_OUTPATIENT_CLINIC_OR_DEPARTMENT_OTHER): Payer: Medicare Other | Admitting: Pain Medicine

## 2018-02-17 ENCOUNTER — Ambulatory Visit
Admission: RE | Admit: 2018-02-17 | Discharge: 2018-02-17 | Disposition: A | Discharge status: Home / Self Care | Payer: Medicare Other | Source: Ambulatory Visit | Attending: Pain Medicine | Admitting: Pain Medicine

## 2018-02-17 VITALS — BP 140/51 | HR 57 | Temp 97.8°F | Resp 15 | Ht 65.0 in | Wt 250.0 lb

## 2018-02-17 DIAGNOSIS — G8929 Other chronic pain: Secondary | ICD-10-CM | POA: Diagnosis present

## 2018-02-17 DIAGNOSIS — M545 Low back pain, unspecified: Secondary | ICD-10-CM

## 2018-02-17 DIAGNOSIS — M47816 Spondylosis without myelopathy or radiculopathy, lumbar region: Secondary | ICD-10-CM | POA: Diagnosis not present

## 2018-02-17 DIAGNOSIS — Z9049 Acquired absence of other specified parts of digestive tract: Secondary | ICD-10-CM | POA: Diagnosis not present

## 2018-02-17 DIAGNOSIS — M47817 Spondylosis without myelopathy or radiculopathy, lumbosacral region: Secondary | ICD-10-CM | POA: Diagnosis present

## 2018-02-17 MED ORDER — LIDOCAINE HCL 2 % IJ SOLN
20.0000 mL | Freq: Once | INTRAMUSCULAR | Status: AC
  Administered 2018-02-17: 400 mg
  Filled 2018-02-17: qty 20

## 2018-02-17 MED ORDER — TRIAMCINOLONE ACETONIDE 40 MG/ML IJ SUSP
80.0000 mg | Freq: Once | INTRAMUSCULAR | Status: AC
  Administered 2018-02-17: 40 mg
  Filled 2018-02-17: qty 2

## 2018-02-17 MED ORDER — LACTATED RINGERS IV SOLN
1000.0000 mL | Freq: Once | INTRAVENOUS | Status: AC
  Administered 2018-02-17: 1000 mL via INTRAVENOUS

## 2018-02-17 MED ORDER — ROPIVACAINE HCL 2 MG/ML IJ SOLN
18.0000 mL | Freq: Once | INTRAMUSCULAR | Status: AC
  Administered 2018-02-17: 10 mL via PERINEURAL
  Filled 2018-02-17: qty 20

## 2018-02-17 MED ORDER — FENTANYL CITRATE (PF) 100 MCG/2ML IJ SOLN
25.0000 ug | INTRAMUSCULAR | Status: DC | PRN
  Administered 2018-02-17: 50 ug via INTRAVENOUS
  Filled 2018-02-17: qty 2

## 2018-02-17 MED ORDER — MIDAZOLAM HCL 5 MG/5ML IJ SOLN
1.0000 mg | INTRAMUSCULAR | Status: DC | PRN
  Administered 2018-02-17: 2 mg via INTRAVENOUS
  Filled 2018-02-17: qty 5

## 2018-02-17 NOTE — Patient Instructions (Addendum)
____________________________________________________________________________________________  Post-Procedure Discharge Instructions  Instructions:  Apply ice: Fill a plastic sandwich bag with crushed ice. Cover it with a small towel and apply to injection site. Apply for 15 minutes then remove x 15 minutes. Repeat sequence on day of procedure, until you go to bed. The purpose is to minimize swelling and discomfort after procedure.  Apply heat: Apply heat to procedure site starting the day following the procedure. The purpose is to treat any soreness and discomfort from the procedure.  Food intake: Start with clear liquids (like water) and advance to regular food, as tolerated.   Physical activities: Keep activities to a minimum for the first 8 hours after the procedure.   Driving: If you have received any sedation, you are not allowed to drive for 24 hours after your procedure.  Blood thinner: Restart your blood thinner 6 hours after your procedure. (Only for those taking blood thinners)  Insulin: As soon as you can eat, you may resume your normal dosing schedule. (Only for those taking insulin)  Infection prevention: Keep procedure site clean and dry.  Post-procedure Pain Diary: Extremely important that this be done correctly and accurately. Recorded information will be used to determine the next step in treatment.  Pain evaluated is that of treated area only. Do not include pain from an untreated area.  Complete every hour, on the hour, for the initial 8 hours. Set an alarm to help you do this part accurately.  Do not go to sleep and have it completed later. It will not be accurate.  Follow-up appointment: Keep your follow-up appointment after the procedure. Usually 2 weeks for most procedures. (6 weeks in the case of radiofrequency.) Bring you pain diary.   Expect:  From numbing medicine (AKA: Local Anesthetics): Numbness or decrease in pain.  Onset: Full effect within 15  minutes of injected.  Duration: It will depend on the type of local anesthetic used. On the average, 1 to 8 hours.   From steroids: Decrease in swelling or inflammation. Once inflammation is improved, relief of the pain will follow.  Onset of benefits: Depends on the amount of swelling present. The more swelling, the longer it will take for the benefits to be seen. In some cases, up to 10 days.  Duration: Steroids will stay in the system x 2 weeks. Duration of benefits will depend on multiple posibilities including persistent irritating factors.  From procedure: Some discomfort is to be expected once the numbing medicine wears off. This should be minimal if ice and heat are applied as instructed.  Call if:  You experience numbness and weakness that gets worse with time, as opposed to wearing off.  New onset bowel or bladder incontinence. (This applies to Spinal procedures only)  Emergency Numbers:  Durning business hours (Monday - Thursday, 8:00 AM - 4:00 PM) (Friday, 9:00 AM - 12:00 Noon): [phone]  After hours: [phone] ____________________________________________________________________________________________  ____________________________________________________________________________________________  Pain Scale  Introduction: The pain score used by this practice is the Verbal Numerical Rating Scale (VNRS-11). This is an 11-point scale. It is for adults and children 10 years or older. There are significant differences in how the pain score is reported, used, and applied. Forget everything you learned in the past and learn this scoring system.  General Information: The scale should reflect your current level of pain. Unless you are specifically asked for the level of your worst pain, or your average pain. If you are asked for one of these two, then it  should be understood that it is over the past 24 hours.  Basic Activities of Daily Living (ADL): Personal hygiene,  dressing, eating, transferring, and using restroom.  Instructions: Most patients tend to report their level of pain as a combination of two factors, their physical pain and their psychosocial pain. This last one is also known as "suffering" and it is reflection of how physical pain affects you socially and psychologically. From now on, report them separately. From this point on, when asked to report your pain level, report only your physical pain. Use the following table for reference.  Pain Clinic Pain Levels (0-5/10)  Pain Level Score  Description  No Pain 0   Mild pain 1 Nagging, annoying, but does not interfere with basic activities of daily living (ADL). Patients are able to eat, bathe, get dressed, toileting (being able to get on and off the toilet and perform personal hygiene functions), transfer (move in and out of bed or a chair without assistance), and maintain continence (able to control bladder and bowel functions). Blood pressure and heart rate are unaffected. A normal heart rate for a healthy adult ranges from 60 to 100 bpm (beats per minute).   Mild to moderate pain 2 Noticeable and distracting. Impossible to hide from other people. More frequent flare-ups. Still possible to adapt and function close to normal. It can be very annoying and may have occasional stronger flare-ups. With discipline, patients may get used to it and adapt.   Moderate pain 3 Interferes significantly with activities of daily living (ADL). It becomes difficult to feed, bathe, get dressed, get on and off the toilet or to perform personal hygiene functions. Difficult to get in and out of bed or a chair without assistance. Very distracting. With effort, it can be ignored when deeply involved in activities.   Moderately severe pain 4 Impossible to ignore for more than a few minutes. With effort, patients may still be able to manage work or participate in some social activities. Very difficult to concentrate. Signs of  autonomic nervous system discharge are evident: dilated pupils (mydriasis); mild sweating (diaphoresis); sleep interference. Heart rate becomes elevated (>115 bpm). Diastolic blood pressure (lower number) rises above 100 mmHg. Patients find relief in laying down and not moving.   Severe pain 5 Intense and extremely unpleasant. Associated with frowning face and frequent crying. Pain overwhelms the senses.  Ability to do any activity or maintain social relationships becomes significantly limited. Conversation becomes difficult. Pacing back and forth is common, as getting into a comfortable position is nearly impossible. Pain wakes you up from deep sleep. Physical signs will be obvious: pupillary dilation; increased sweating; goosebumps; brisk reflexes; cold, clammy hands and feet; nausea, vomiting or dry heaves; loss of appetite; significant sleep disturbance with inability to fall asleep or to remain asleep. When persistent, significant weight loss is observed due to the complete loss of appetite and sleep deprivation.  Blood pressure and heart rate becomes significantly elevated. Caution: If elevated blood pressure triggers a pounding headache associated with blurred vision, then the patient should immediately seek attention at an urgent or emergency care unit, as these may be signs of an impending stroke.    Emergency Department Pain Levels (6-10/10)  Emergency Room Pain 6 Severely limiting. Requires emergency care and should not be seen or managed at an outpatient pain management facility. Communication becomes difficult and requires great effort. Assistance to reach the emergency department may be required. Facial flushing and profuse sweating along with potentially dangerous  increases in heart rate and blood pressure will be evident.   Distressing pain 7 Self-care is very difficult. Assistance is required to transport, or use restroom. Assistance to reach the emergency department will be required. Tasks  requiring coordination, such as bathing and getting dressed become very difficult.   Disabling pain 8 Self-care is no longer possible. At this level, pain is disabling. The individual is unable to do even the most "basic" activities such as walking, eating, bathing, dressing, transferring to a bed, or toileting. Fine motor skills are lost. It is difficult to think clearly.   Incapacitating pain 9 Pain becomes incapacitating. Thought processing is no longer possible. Difficult to remember your own name. Control of movement and coordination are lost.   The worst pain imaginable 10 At this level, most patients pass out from pain. When this level is reached, collapse of the autonomic nervous system occurs, leading to a sudden drop in blood pressure and heart rate. This in turn results in a temporary and dramatic drop in blood flow to the brain, leading to a loss of consciousness. Fainting is one of the body's self defense mechanisms. Passing out puts the brain in a calmed state and causes it to shut down for a while, in order to begin the healing process.    Summary: 1. Refer to this scale when providing [place] with your pain level. 2. Be accurate and careful when reporting your pain level. This will help with your care. 3. Over-reporting your pain level will lead to loss of credibility. 4. Even a level of 1/10 means that there is pain and will be treated at our facility. 5. High, inaccurate reporting will be documented as "Symptom Exaggeration", leading to loss of credibility and suspicions of possible secondary gains such as obtaining more narcotics, or wanting to appear disabled, for fraudulent reasons. 6. Only pain levels of 5 or below will be seen at our facility. 7. Pain levels of 6 and above will be sent to the Emergency Department and the appointment cancelled. ____________________________________________________________________________________________

## 2018-02-17 NOTE — Progress Notes (Signed)
Patient's Name: Sabrina Green  MRN: [id number]  Referring Provider: Alanson Aly, FNP  DOB: September 25, 1945  PCP: Alanson Aly, FNP  DOS: 02/17/2018  Note by: Gaspar Cola, MD  Service setting: Ambulatory outpatient  Specialty: Interventional Pain Management  Patient type: Established  Location: [facility] (AMB) Pain Management Facility  Visit type: Interventional Procedure   Primary Reason for Visit: Interventional Pain Management Treatment. CC: Back Pain (lower)  Procedure:          Anesthesia, Analgesia, Anxiolysis:  Type: Lumbar Facet, Medial Branch Block(s) #1  Primary Purpose: Diagnostic Region: Posterolateral Lumbosacral Spine Level: L2, L3, L4, L5, & S1 Medial Branch Level(s). Injecting these levels blocks the L3-4, L4-5, and L5-S1 lumbar facet joints. Laterality: Bilateral  Type: Moderate (Conscious) Sedation combined with Local Anesthesia Indication(s): Analgesia and Anxiety Route: Intravenous (IV) IV Access: Secured Sedation: Meaningful verbal contact was maintained at all times during the procedure  Local Anesthetic: Lidocaine 1-2%   Indications: 1. Spondylosis without myelopathy or radiculopathy, lumbosacral region   2. Lumbar facet syndrome (Bilateral) (L>R)   3. Chronic low back pain (Secondary Area of Pain) (Bilateral) (L>R)    Pain Score: Pre-procedure: 3 /10 Post-procedure: 0-No pain/10  Pre-op Assessment:  Sabrina Green is a 72 y.o. (year old), female patient, seen today for interventional treatment. She  has a past surgical history that includes Tonsillectomy; Cesarean section; Tubal ligation; Abdominal hysterectomy; Knee arthroscopy; Cholecystectomy; Abdominal hysterectomy; Ankle fracture surgery (Right, 1989); and Eye surgery (Bilateral, 2015). Sabrina Green has a current medication list which includes the following prescription(s): allopurinol, calcium-vitamin d, cholecalciferol, fluticasone, furosemide, gabapentin, losartan, methotrexate, mupirocin ointment,  omeprazole, promethazine, propranolol, sertraline, tizanidine, diazepam, and lubiprostone, and the following Facility-Administered Medications: fentanyl and midazolam. Her primarily concern today is the Back Pain (lower)  Initial Vital Signs:  Pulse/HCG Rate: 62ECG Heart Rate: 64 Temp: 98.2 F (36.8 C) Resp: 16 BP: (!) 153/70 SpO2: 100 %  BMI: Estimated body mass index is 41.6 kg/m as calculated from the following:   Height as of this encounter: 5\' 5"  (1.651 m).   Weight as of this encounter: 250 lb (113.4 kg).  Risk Assessment: Allergies: Reviewed. She is allergic to penicillins.  Allergy Precautions: None required Coagulopathies: Reviewed. None identified.  Blood-thinner therapy: None at this time Active Infection(s): Reviewed. None identified. Sabrina Green is afebrile  Site Confirmation: Sabrina Green was asked to confirm the procedure and laterality before marking the site Procedure checklist: Completed Consent: Before the procedure and under the influence of no sedative(s), amnesic(s), or anxiolytics, the patient was informed of the treatment options, risks and possible complications. To fulfill our ethical and legal obligations, as recommended by the American Medical Association's Code of Ethics, I have informed the patient of my clinical impression; the nature and purpose of the treatment or procedure; the risks, benefits, and possible complications of the intervention; the alternatives, including doing nothing; the risk(s) and benefit(s) of the alternative treatment(s) or procedure(s); and the risk(s) and benefit(s) of doing nothing. The patient was provided information about the general risks and possible complications associated with the procedure. These may include, but are not limited to: failure to achieve desired goals, infection, bleeding, organ or nerve damage, allergic reactions, paralysis, and death. In addition, the patient was informed of those risks and complications  associated to Spine-related procedures, such as failure to decrease pain; infection (i.e.: Meningitis, epidural or intraspinal abscess); bleeding (i.e.: epidural hematoma, subarachnoid hemorrhage, or any other type of intraspinal or peri-dural bleeding); organ or nerve damage (  i.e.: Any type of peripheral nerve, nerve root, or spinal cord injury) with subsequent damage to sensory, motor, and/or autonomic systems, resulting in permanent pain, numbness, and/or weakness of one or several areas of the body; allergic reactions; (i.e.: anaphylactic reaction); and/or death. Furthermore, the patient was informed of those risks and complications associated with the medications. These include, but are not limited to: allergic reactions (i.e.: anaphylactic or anaphylactoid reaction(s)); adrenal axis suppression; blood sugar elevation that in diabetics may result in ketoacidosis or comma; water retention that in patients with history of congestive heart failure may result in shortness of breath, pulmonary edema, and decompensation with resultant heart failure; weight gain; swelling or edema; medication-induced neural toxicity; particulate matter embolism and blood vessel occlusion with resultant organ, and/or nervous system infarction; and/or aseptic necrosis of one or more joints. Finally, the patient was informed that Medicine is not an exact science; therefore, there is also the possibility of unforeseen or unpredictable risks and/or possible complications that may result in a catastrophic outcome. The patient indicated having understood very clearly. We have given the patient no guarantees and we have made no promises. Enough time was given to the patient to ask questions, all of which were answered to the patient's satisfaction. Sabrina Green has indicated that she wanted to continue with the procedure. Attestation: I, the ordering provider, attest that I have discussed with the patient the benefits, risks, side-effects,  alternatives, likelihood of achieving goals, and potential problems during recovery for the procedure that I have provided informed consent. Date  Time: 02/17/2018  9:55 AM  Pre-Procedure Preparation:  Monitoring: As per clinic protocol. Respiration, ETCO2, SpO2, BP, heart rate and rhythm monitor placed and checked for adequate function Safety Precautions: Patient was assessed for positional comfort and pressure points before starting the procedure. Time-out: I initiated and conducted the "Time-out" before starting the procedure, as per protocol. The patient was asked to participate by confirming the accuracy of the "Time Out" information. Verification of the correct person, site, and procedure were performed and confirmed by me, the nursing staff, and the patient. "Time-out" conducted as per Joint Commission's Universal Protocol (UP.01.01.01). Time: 1039  Description of Procedure:          Position: Prone Laterality: Bilateral. The procedure was performed in identical fashion on both sides. Levels:  L2, L3, L4, L5, & S1 Medial Branch Level(s) Area Prepped: Posterior Lumbosacral Region Prepping solution: ChloraPrep (2% chlorhexidine gluconate and 70% isopropyl alcohol) Safety Precautions: Aspiration looking for blood return was conducted prior to all injections. At no point did we inject any substances, as a needle was being advanced. Before injecting, the patient was told to immediately notify me if she was experiencing any new onset of "ringing in the ears, or metallic taste in the mouth". No attempts were made at seeking any paresthesias. Safe injection practices and needle disposal techniques used. Medications properly checked for expiration dates. SDV (single dose vial) medications used. After the completion of the procedure, all disposable equipment used was discarded in the proper designated medical waste containers. Local Anesthesia: Protocol guidelines were followed. The patient was  positioned over the fluoroscopy table. The area was prepped in the usual manner. The time-out was completed. The target area was identified using fluoroscopy. A 12-in long, straight, sterile hemostat was used with fluoroscopic guidance to locate the targets for each level blocked. Once located, the skin was marked with an approved surgical skin marker. Once all sites were marked, the skin (epidermis, dermis, and hypodermis), as  well as deeper tissues (fat, connective tissue and muscle) were infiltrated with a small amount of a short-acting local anesthetic, loaded on a 10cc syringe with a 25G, 1.5-in  Needle. An appropriate amount of time was allowed for local anesthetics to take effect before proceeding to the next step. Local Anesthetic: Lidocaine 2.0% The unused portion of the local anesthetic was discarded in the proper designated containers. Technical explanation of process:  L2 Medial Branch Nerve Block (MBB): The target area for the L2 medial branch is at the junction of the postero-lateral aspect of the superior articular process and the superior, posterior, and medial edge of the transverse process of L3. Under fluoroscopic guidance, a Quincke needle was inserted until contact was made with os over the superior postero-lateral aspect of the pedicular shadow (target area). After negative aspiration for blood, 0.5 mL of the nerve block solution was injected without difficulty or complication. The needle was removed intact. L3 Medial Branch Nerve Block (MBB): The target area for the L3 medial branch is at the junction of the postero-lateral aspect of the superior articular process and the superior, posterior, and medial edge of the transverse process of L4. Under fluoroscopic guidance, a Quincke needle was inserted until contact was made with os over the superior postero-lateral aspect of the pedicular shadow (target area). After negative aspiration for blood, 0.5 mL of the nerve block solution was  injected without difficulty or complication. The needle was removed intact. L4 Medial Branch Nerve Block (MBB): The target area for the L4 medial branch is at the junction of the postero-lateral aspect of the superior articular process and the superior, posterior, and medial edge of the transverse process of L5. Under fluoroscopic guidance, a Quincke needle was inserted until contact was made with os over the superior postero-lateral aspect of the pedicular shadow (target area). After negative aspiration for blood, 0.5 mL of the nerve block solution was injected without difficulty or complication. The needle was removed intact. L5 Medial Branch Nerve Block (MBB): The target area for the L5 medial branch is at the junction of the postero-lateral aspect of the superior articular process and the superior, posterior, and medial edge of the sacral ala. Under fluoroscopic guidance, a Quincke needle was inserted until contact was made with os over the superior postero-lateral aspect of the pedicular shadow (target area). After negative aspiration for blood, 0.5 mL of the nerve block solution was injected without difficulty or complication. The needle was removed intact. S1 Medial Branch Nerve Block (MBB): The target area for the S1 medial branch is at the posterior and inferior 6 o'clock position of the L5-S1 facet joint. Under fluoroscopic guidance, the Quincke needle inserted for the L5 MBB was redirected until contact was made with os over the inferior and postero aspect of the sacrum, at the 6 o' clock position under the L5-S1 facet joint (Target area). After negative aspiration for blood, 0.5 mL of the nerve block solution was injected without difficulty or complication. The needle was removed intact. Procedural Needles: 22-gauge, 3.5-inch, Quincke needles used for all levels. Nerve block solution: 0.2% PF-Ropivacaine + Triamcinolone (40 mg/mL) diluted to a final concentration of 4 mg of Triamcinolone/mL of  Ropivacaine The unused portion of the solution was discarded in the proper designated containers.  Once the entire procedure was completed, the treated area was cleaned, making sure to leave some of the prepping solution back to take advantage of its long term bactericidal properties.   Illustration of the posterior view of  the lumbar spine and the posterior neural structures. Laminae of L2 through S1 are labeled. DPRL5, dorsal primary ramus of L5; DPRS1, dorsal primary ramus of S1; DPR3, dorsal primary ramus of L3; FJ, facet (zygapophyseal) joint L3-L4; I, inferior articular process of L4; LB1, lateral branch of dorsal primary ramus of L1; IAB, inferior articular branches from L3 medial branch (supplies L4-L5 facet joint); IBP, intermediate branch plexus; MB3, medial branch of dorsal primary ramus of L3; NR3, third lumbar nerve root; S, superior articular process of L5; SAB, superior articular branches from L4 (supplies L4-5 facet joint also); TP3, transverse process of L3.  Vitals:   02/17/18 1100 02/17/18 1110 02/17/18 1120 02/17/18 1130  BP: 125/70 (!) 127/45 (!) 138/53 (!) 140/51  Pulse:  (!) 57 60 (!) 57  Resp: 17 13 18 15   Temp:  98.4 F (36.9 C)  97.8 F (36.6 C)  TempSrc:  Temporal  Temporal  SpO2: 100% 100% 100% 100%  Weight:      Height:        Start Time: 1039 hrs. End Time: 1059 hrs.  Imaging Guidance (Spinal):          Type of Imaging Technique: Fluoroscopy Guidance (Spinal) Indication(s): Assistance in needle guidance and placement for procedures requiring needle placement in or near specific anatomical locations not easily accessible without such assistance. Exposure Time: Please see nurses notes. Contrast: None used. Fluoroscopic Guidance: I was personally present during the use of fluoroscopy. "Tunnel Vision Technique" used to obtain the best possible view of the target area. Parallax error corrected before commencing the procedure. "Direction-depth-direction" technique  used to introduce the needle under continuous pulsed fluoroscopy. Once target was reached, antero-posterior, oblique, and lateral fluoroscopic projection used confirm needle placement in all planes. Images permanently stored in EMR. Interpretation: No contrast injected. I personally interpreted the imaging intraoperatively. Adequate needle placement confirmed in multiple planes. Permanent images saved into the patient's record.  Antibiotic Prophylaxis:   Anti-infectives (From admission, onward)   None     Indication(s): None identified  Post-operative Assessment:  Post-procedure Vital Signs:  Pulse/HCG Rate: (!) 5760 Temp: 97.8 F (36.6 C) Resp: 15 BP: (!) 140/51 SpO2: 100 %  EBL: None  Complications: No immediate post-treatment complications observed by team, or reported by patient.  Note: The patient tolerated the entire procedure well. A repeat set of vitals were taken after the procedure and the patient was kept under observation following institutional policy, for this type of procedure. Post-procedural neurological assessment was performed, showing return to baseline, prior to discharge. The patient was provided with post-procedure discharge instructions, including a section on how to identify potential problems. Should any problems arise concerning this procedure, the patient was given instructions to immediately contact us, at any time, without hesitation. In any case, we plan to contact the patient by telephone for a follow-up status report regarding this interventional procedure.  Comments:  No additional relevant information.  Plan of Care    Imaging Orders     DG C-Arm 1-60 Min-No Report  Procedure Orders     LUMBAR FACET(MEDIAL BRANCH NERVE BLOCK) MBNB  Medications ordered for procedure: Meds ordered this encounter  Medications  . lidocaine (XYLOCAINE) 2 % (with pres) injection 400 mg  . midazolam (VERSED) 5 MG/5ML injection 1-2 mg    Make sure Flumazenil is  available in the pyxis when using this medication. If oversedation occurs, administer 0.2 mg IV over 15 sec. If after 45 sec no response, administer 0.2 mg again over  1 min; may repeat at 1 min intervals; not to exceed 4 doses (1 mg)  . fentaNYL (SUBLIMAZE) injection 25-50 mcg    Make sure Narcan is available in the pyxis when using this medication. In the event of respiratory depression (RR< 8/min): Titrate NARCAN (naloxone) in increments of 0.1 to 0.2 mg IV at 2-3 minute intervals, until desired degree of reversal.  . lactated ringers infusion 1,000 mL  . ropivacaine (PF) 2 mg/mL (0.2%) (NAROPIN) injection 18 mL  . triamcinolone acetonide (KENALOG-40) injection 80 mg   Medications administered: We administered lidocaine, midazolam, fentaNYL, lactated ringers, ropivacaine (PF) 2 mg/mL (0.2%), and triamcinolone acetonide.  See the medical record for exact dosing, route, and time of administration.  New Prescriptions   No medications on file   Disposition: Discharge home  Discharge Date & Time: 02/17/2018; 1135 hrs.   Physician-requested Follow-up: Return for post-procedure eval (2 wks), w/ Dr. Dossie Arbour.  Future Appointments  Date Time Provider [place]  03/02/2018  1:30 PM Milinda Pointer, MD [facility] None   Primary Care Physician: Alanson Aly, FNP Location: [facility] Note by: Gaspar Cola, MD Date: 02/17/2018; Time: 12:24 PM  Disclaimer:  Medicine is not an Chief Strategy Officer. The only guarantee in medicine is that nothing is guaranteed. It is important to note that the decision to proceed with this intervention was based on the information collected from the patient. The Data and conclusions were drawn from the patient's questionnaire, the interview, and the physical examination. Because the information was provided in large part by the patient, it cannot be guaranteed that it has not been purposely or unconsciously manipulated. Every  effort has been made to obtain as much relevant data as possible for this evaluation. It is important to note that the conclusions that lead to this procedure are derived in large part from the available data. Always take into account that the treatment will also be dependent on availability of resources and existing treatment guidelines, considered by other Pain Management Practitioners as being common knowledge and practice, at the time of the intervention. For Medico-Legal purposes, it is also important to point out that variation in procedural techniques and pharmacological choices are the acceptable norm. The indications, contraindications, technique, and results of the above procedure should only be interpreted and judged by a Board-Certified Interventional Pain Specialist with extensive familiarity and expertise in the same exact procedure and technique.

## 2018-02-18 ENCOUNTER — Telehealth: Payer: Self-pay | Admitting: *Deleted

## 2018-02-18 NOTE — Telephone Encounter (Signed)
Voicemail left with patient to please call our office if there are questions or concerns re; procedure on yesterday.  

## 2018-02-27 NOTE — Progress Notes (Signed)
Patient's Name: Sabrina Green  MRN: [id number]  Referring Provider: Alanson Aly, FNP  DOB: 1945/09/27  PCP: Alanson Aly, FNP  DOS: 03/02/2018  Note by: Gaspar Cola, MD  Service setting: Ambulatory outpatient  Specialty: Interventional Pain Management  Location: [facility] (AMB) Pain Management Facility    Patient type: Established   Primary Reason(s) for Visit: Encounter for post-procedure evaluation of chronic illness with mild to moderate exacerbation CC: Back Pain (low)  HPI  Sabrina Green is a 72 y.o. year old, female patient, who comes today for a post-procedure evaluation. She has BMI 40.0-44.9, adult ([place]); Shoulder bursitis (Right); Chronic gout; Chronic low back pain (Midline); CKD (chronic kidney disease) stage 3, GFR 30-59 ml/min (HCC); Elevated erythrocyte sedimentation rate; DDD (degenerative disc disease), lumbar; Encounter for long-term (current) use of high-risk medication; Essential hypertension; Exercise-induced asthma; Fatty liver disease, nonalcoholic; Fibromyalgia; GERD without esophagitis; IBS (irritable bowel syndrome); Long term current use of opiate analgesic; Osteoarthritis of knee (Left); Rheumatoid arthritis involving multiple sites [facility]); Vitamin B12 deficiency; Vitamin D deficiency; Chronic pain syndrome; Pharmacologic therapy; Disorder of skeletal system; Problems influencing health status; Chronic knee pain (Primary Area of Pain) (Bilateral) (L>R); Chronic lower extremity pain (Tertiary Area of Pain) (Left); Chronic shoulder pain (Fourth Area of Pain) (Right); Chronic hip pain (Left); Elevated C-reactive protein (CRP); History of claustrophobia; History of panic attacks; Chronic pain of both shoulders; Primary osteoarthritis of both shoulders; Osteoarthritis of knees (Bilateral); Tricompartmental disease of knee (Bilateral); Chronic low back pain (Secondary Area of Pain) (Bilateral) (L>R); Lumbar facet syndrome (Bilateral) (L>R); Spondylosis without myelopathy or  radiculopathy, lumbosacral region; and Neurogenic pain on their problem list. Her primarily concern today is the Back Pain (low)  Pain Assessment: Location: Lower Back Radiating: denies Onset: More than a month ago Duration: Chronic pain Quality: Dull Severity: 0-No pain/10 (subjective, self-reported pain score)  Note: Reported level is compatible with observation.                               Timing: Rarely Modifying factors: rest, procedures BP: (!) 145/83  HR: 66  Sabrina Green comes in today for post-procedure evaluation after the treatment done on 02/18/2018. The patient did extremely well after the diagnostic bilateral lumbar facet block #1, under fluoroscopic guidance and IV sedation. At this point, she indicates not having any back pain. She is pending to be evaluated for gastric bypass. We have talked about her BMI and how it affects her back and knee problems. She understands and is willing to work towards loosing some weight. The long-term plan is to repeat the diagnostic bilateral lumbar facet block but the pain comes back. Should she continue getting similar benefits, then we will move on to radiofrequency ablation.  Further details on both, my assessment(s), as well as the proposed treatment plan, please see below.  Post-Procedure Assessment  02/17/2018 Procedure: Diagnostic bilateral lumbar facet block #1 under fluoroscopic guidance and IV sedation Pre-procedure pain score:  3/10 Post-procedure pain score: 0/10 (100% relief) Influential Factors: BMI: 40.94 kg/m Intra-procedural challenges: None observed.         Assessment challenges: None detected.              Reported side-effects: None.        Post-procedural adverse reactions or complications: None reported         Sedation: Sedation provided. When no sedatives are used, the analgesic levels obtained are directly associated to the effectiveness  of the local anesthetics. However, when sedation is provided, the level of  analgesia obtained during the initial 1 hour following the intervention, is believed to be the result of a combination of factors. These factors may include, but are not limited to: 1. The effectiveness of the local anesthetics used. 2. The effects of the analgesic(s) and/or anxiolytic(s) used. 3. The degree of discomfort experienced by the patient at the time of the procedure. 4. The patients ability and reliability in recalling and recording the events. 5. The presence and influence of possible secondary gains and/or psychosocial factors. Reported result: Relief experienced during the 1st hour after the procedure: 100 % (Ultra-Short Term Relief) Sabrina Green has indicated area to have been numb during this time. Interpretative annotation: Clinically appropriate result. Analgesia during this period is likely to be Local Anesthetic and/or IV Sedative (Analgesic/Anxiolytic) related.          Effects of local anesthetic: The analgesic effects attained during this period are directly associated to the localized infiltration of local anesthetics and therefore cary significant diagnostic value as to the etiological location, or anatomical origin, of the pain. Expected duration of relief is directly dependent on the pharmacodynamics of the local anesthetic used. Long-acting (4-6 hours) anesthetics used.  Reported result: Relief during the next 4 to 6 hour after the procedure: 80 % (Short-Term Relief)            Interpretative annotation: Clinically appropriate result. Analgesia during this period is likely to be Local Anesthetic-related.          Long-term benefit: Defined as the period of time past the expected duration of local anesthetics (1 hour for short-acting and 4-6 hours for long-acting). With the possible exception of prolonged sympathetic blockade from the local anesthetics, benefits during this period are typically attributed to, or associated with, other factors such as analgesic sensory  neuropraxia, antiinflammatory effects, or beneficial biochemical changes provided by agents other than the local anesthetics.  Reported result: Extended relief following procedure: 80 % (Long-Term Relief)            Interpretative annotation: Clinically appropriate result. Good relief. No permanent benefit expected. Inflammation plays a part in the etiology to the pain.          Current benefits: Defined as reported results that persistent at this point in time.   Analgesia: 75-100 % Sabrina Green reports improvement of axial symptoms. Function: Sabrina Green reports improvement in function ROM: Sabrina Green reports improvement in ROM Interpretative annotation: Ongoing benefit. Therapeutic benefit observed. Effective therapeutic approach.          Interpretation: Results would suggest a successful diagnostic intervention.                  Plan:  Set up procedure as a PRN palliative treatment option for this patient.                Laboratory Chemistry  Inflammation Markers (CRP: Acute Phase) (ESR: Chronic Phase) Lab Results  Component Value Date   CRP 19.5 (H) 12/11/2017   ESRSEDRATE 90 (H) 12/11/2017                         Renal Markers Lab Results  Component Value Date   BUN 24 12/11/2017   CREATININE 1.54 (H) 12/11/2017   BCR 16 12/11/2017   GFRAA 39 (L) 12/11/2017   GFRNONAA 34 (L) 12/11/2017  Hepatic Markers Lab Results  Component Value Date   AST 14 12/11/2017   ALBUMIN 3.9 12/11/2017                        Neuropathy Markers No results found for: HGBA1C, HIV                      Hematology Parameters Lab Results  Component Value Date   PLT 132 (L) 09/07/2017   HGB 12.2 09/07/2017   HCT 36.7 09/07/2017                        CV Markers Lab Results  Component Value Date   TROPONINI <0.03 09/07/2017                         Note: Lab results reviewed.  Recent Imaging Results   Results for orders placed in visit on 02/17/18  DG C-Arm  1-60 Min-No Report   Narrative Fluoroscopy was utilized by the requesting physician.  No radiographic  interpretation.    Interpretation Report: Fluoroscopy was used during the procedure to assist with needle guidance. The images were interpreted intraoperatively by the requesting physician.  Meds   Current Outpatient Medications:  .  allopurinol (ZYLOPRIM) 300 MG tablet, Take 300 mg by mouth daily., Disp: , Rfl:  .  calcium-vitamin D 250-100 MG-UNIT tablet, Take 1 tablet by mouth 2 (two) times daily., Disp: , Rfl:  .  cholecalciferol (VITAMIN D) 400 units TABS tablet, Take 2,000 Units by mouth. , Disp: , Rfl:  .  diazepam (VALIUM) 5 MG tablet, Take 1 tablet (5 mg total) by mouth as needed for up to 2 doses for anxiety (Take one tab 45 minutes before MRI. Take second tablet just prior to MRI scan). Do not take medication within 4 hours of taking opioid pain medications. Must have a driver. Do not drive or operate machinery x 24 hours after taking this medication., Disp: 2 tablet, Rfl: 0 .  fluticasone (FLONASE) 50 MCG/ACT nasal spray, Place 2 sprays into both nostrils daily., Disp: , Rfl:  .  furosemide (LASIX) 10 MG/ML solution, Take by mouth daily., Disp: , Rfl:  .  gabapentin (NEURONTIN) 300 MG capsule, Take 1 capsule (300 mg total) by mouth 2 (two) times daily., Disp: 180 capsule, Rfl: 1 .  losartan (COZAAR) 100 MG tablet, Take 100 mg by mouth daily., Disp: , Rfl:  .  lubiprostone (AMITIZA) 24 MCG capsule, Take 24 mcg by mouth 2 (two) times daily with a meal., Disp: , Rfl:  .  methotrexate (RHEUMATREX) 2.5 MG tablet, Take 7.5 mg by mouth 3 (three) times a week., Disp: , Rfl:  .  mupirocin ointment (BACTROBAN) 2 %, Apply 1 application topically 3 (three) times daily., Disp: 22 g, Rfl: 0 .  omeprazole (PRILOSEC) 20 MG capsule, Take 20 mg by mouth daily., Disp: , Rfl:  .  promethazine (PHENERGAN) 25 MG tablet, Take 25 mg by mouth every 6 (six) hours as needed for nausea or vomiting., Disp: ,  Rfl:  .  propranolol (INDERAL) 80 MG tablet, Take 80 mg by mouth daily., Disp: , Rfl:  .  sertraline (ZOLOFT) 100 MG tablet, Take 100 mg by mouth daily., Disp: , Rfl:  .  tiZANidine (ZANAFLEX) 4 MG capsule, Take 4 mg by mouth 2 (two) times daily as needed for muscle spasms., Disp: , Rfl:   ROS  Constitutional: Denies any fever or chills Gastrointestinal: No reported hemesis, hematochezia, vomiting, or acute GI distress Musculoskeletal: Denies any acute onset joint swelling, redness, loss of ROM, or weakness Neurological: No reported episodes of acute onset apraxia, aphasia, dysarthria, agnosia, amnesia, paralysis, loss of coordination, or loss of consciousness  Allergies  Sabrina Green is allergic to penicillins.  PFSH  Drug: Sabrina Green  reports that she does not use drugs. Alcohol:  reports that she does not drink alcohol. Tobacco:  reports that she has never smoked. She has never used smokeless tobacco. Medical:  has a past medical history of Arthritis, Hypertension, and Rheumatoid arthritis ([place]). Surgical: Sabrina Green  has a past surgical history that includes Tonsillectomy; Cesarean section; Tubal ligation; Abdominal hysterectomy; Knee arthroscopy; Cholecystectomy; Abdominal hysterectomy; Ankle fracture surgery (Right, 1989); and Eye surgery (Bilateral, 2015). Family: family history includes Breast cancer (age of onset: 68) in her other; Hypertension in her mother; Migraines in her mother; Other in her father.  Constitutional Exam  General appearance: Well nourished, well developed, and well hydrated. In no apparent acute distress Vitals:   03/02/18 1357  BP: (!) 145/83  Pulse: 66  Resp: 18  Temp: 98 F (36.7 C)  TempSrc: Oral  SpO2: 100%  Weight: 246 lb (111.6 kg)  Height: _0  (1.651 m)   BMI Assessment: Estimated body mass index is 40.94 kg/m as calculated from the following:   Height as of this encounter: _1  (1.651 m).   Weight as of this encounter: 246 lb (111.6  kg).  BMI interpretation table: BMI level Category Range association with higher incidence of chronic pain  <18 kg/m2 Underweight   18.5-24.9 kg/m2 Ideal body weight   25-29.9 kg/m2 Overweight Increased incidence by 20%  30-34.9 kg/m2 Obese (Class I) Increased incidence by 68%  35-39.9 kg/m2 Severe obesity (Class II) Increased incidence by 136%  >40 kg/m2 Extreme obesity (Class III) Increased incidence by 254%   Patient's current BMI Ideal Body weight  Body mass index is 40.94 kg/m. Ideal body weight: 57 kg (125 lb 10.6 oz) Adjusted ideal body weight: 78.8 kg (173 lb 12.8 oz)   BMI Readings from Last 4 Encounters:  03/02/18 40.94 kg/m  02/17/18 41.60 kg/m  02/04/18 41.60 kg/m  01/15/18 40.94 kg/m   Wt Readings from Last 4 Encounters:  03/02/18 246 lb (111.6 kg)  02/17/18 250 lb (113.4 kg)  02/04/18 250 lb (113.4 kg)  01/15/18 246 lb (111.6 kg)  Psych/Mental status: Alert, oriented x 3 (person, place, & time)       Eyes: PERLA Respiratory: No evidence of acute respiratory distress  Cervical Spine Area Exam  Skin & Axial Inspection: No masses, redness, edema, swelling, or associated skin lesions Alignment: Symmetrical Functional ROM: Unrestricted ROM      Stability: No instability detected Muscle Tone/Strength: Functionally intact. No obvious neuro-muscular anomalies detected. Sensory (Neurological): Unimpaired Palpation: No palpable anomalies              Upper Extremity (UE) Exam    Side: Right upper extremity  Side: Left upper extremity  Skin & Extremity Inspection: Skin color, temperature, and hair growth are WNL. No peripheral edema or cyanosis. No masses, redness, swelling, asymmetry, or associated skin lesions. No contractures.  Skin & Extremity Inspection: Skin color, temperature, and hair growth are WNL. No peripheral edema or cyanosis. No masses, redness, swelling, asymmetry, or associated skin lesions. No contractures.  Functional ROM: Unrestricted ROM           Functional ROM:  Unrestricted ROM          Muscle Tone/Strength: Functionally intact. No obvious neuro-muscular anomalies detected.  Muscle Tone/Strength: Functionally intact. No obvious neuro-muscular anomalies detected.  Sensory (Neurological): Unimpaired          Sensory (Neurological): Unimpaired          Palpation: No palpable anomalies              Palpation: No palpable anomalies              Provocative Test(s):  Phalen's test: deferred Tinel's test: deferred Apley's scratch test (touch opposite shoulder):  Action 1 (Across chest): deferred Action 2 (Overhead): deferred Action 3 (LB reach): deferred   Provocative Test(s):  Phalen's test: deferred Tinel's test: deferred Apley's scratch test (touch opposite shoulder):  Action 1 (Across chest): deferred Action 2 (Overhead): deferred Action 3 (LB reach): deferred    Thoracic Spine Area Exam  Skin & Axial Inspection: No masses, redness, or swelling Alignment: Symmetrical Functional ROM: Unrestricted ROM Stability: No instability detected Muscle Tone/Strength: Functionally intact. No obvious neuro-muscular anomalies detected. Sensory (Neurological): Unimpaired Muscle strength & Tone: No palpable anomalies  Lumbar Spine Area Exam  Skin & Axial Inspection: No masses, redness, or swelling Alignment: Symmetrical Functional ROM: Improved after treatment       Stability: No instability detected Muscle Tone/Strength: Functionally intact. No obvious neuro-muscular anomalies detected. Sensory (Neurological): Unimpaired Palpation: No palpable anomalies       Provocative Tests: Hyperextension/rotation test: Improved after treatment       Lumbar quadrant test (Kemp's test): Improved after treatment       Lateral bending test: deferred today       Patrick's Maneuver: deferred today                   FABER test: deferred today                   S-I anterior distraction/compression test: deferred today         S-I lateral compression  test: deferred today         S-I Thigh-thrust test: deferred today         S-I Gaenslen's test: deferred today         Gait & Posture Assessment  Ambulation: Unassisted Gait: Relatively normal for age and body habitus Posture: WNL   Lower Extremity Exam    Side: Right lower extremity  Side: Left lower extremity  Stability: No instability observed          Stability: No instability observed          Skin & Extremity Inspection: Skin color, temperature, and hair growth are WNL. No peripheral edema or cyanosis. No masses, redness, swelling, asymmetry, or associated skin lesions. No contractures.  Skin & Extremity Inspection: Skin color, temperature, and hair growth are WNL. No peripheral edema or cyanosis. No masses, redness, swelling, asymmetry, or associated skin lesions. No contractures.  Functional ROM: Unrestricted ROM                  Functional ROM: Unrestricted ROM                  Muscle Tone/Strength: Functionally intact. No obvious neuro-muscular anomalies detected.  Muscle Tone/Strength: Functionally intact. No obvious neuro-muscular anomalies detected.  Sensory (Neurological): Unimpaired  Sensory (Neurological): Unimpaired  Palpation: No palpable anomalies  Palpation: No palpable anomalies   Assessment  Primary Diagnosis & Pertinent Problem List: The primary encounter diagnosis  was Chronic low back pain (Secondary Area of Pain) (Bilateral) (L>R). Diagnoses of Spondylosis without myelopathy or radiculopathy, lumbosacral region, Lumbar facet syndrome (Bilateral) (L>R), Neurogenic pain, and Fibromyalgia were also pertinent to this visit.  Status Diagnosis  Improved Stable Improved 1. Chronic low back pain (Secondary Area of Pain) (Bilateral) (L>R)   2. Spondylosis without myelopathy or radiculopathy, lumbosacral region   3. Lumbar facet syndrome (Bilateral) (L>R)   4. Neurogenic pain   5. Fibromyalgia     Problems updated and reviewed during this visit: Problem  Neurogenic  Pain   Plan of Care  Pharmacotherapy (Medications Ordered): Meds ordered this encounter  Medications  . gabapentin (NEURONTIN) 300 MG capsule    Sig: Take 1 capsule (300 mg total) by mouth 2 (two) times daily.    Dispense:  180 capsule    Refill:  1   Medications administered today: Blanch Media A. Tinkey had no medications administered during this visit.   Procedure Orders     LUMBAR FACET(MEDIAL BRANCH NERVE BLOCK) [facility] Lab Orders  No laboratory test(s) ordered today   Imaging Orders  No imaging studies ordered today   Referral Orders  No referral(s) requested today    Interventional management options: Planned, scheduled, and/or pending:   Diagnostic bilateral lumbar facet block #2 under fluoroscopic guidance and IV sedation (PRN)    Considering:   Diagnostic bilateral intra-articular knee injection Diagnostic bilateral Hyalgan series Diagnosticbilateral genicular nerve blocks Possible bilateral genicular nerve RFA Diagnosticleft side LESI Diagnosticbilateral lumbar facet nerve block #2  Diagnostic bilateral lumbar facet RFA Diagnostic right shoulder intra-articular joint injection Diagnosticright suprascapular nerve block Possible right suprascapular RFA   Palliative PRN treatment(s):   Therapeutic bilateral intra-articular, series of 5, Hyalgan knee injections under fluoroscopic guidance, no sedation    Provider-requested follow-up: Return for PRN Procedure: (B) L-FCT BLK #2.  No future appointments. Primary Care Physician: Alanson Aly, FNP Location: [facility] Note by: Gaspar Cola, MD Date: 03/02/2018; Time: 4:10 PM

## 2018-03-02 ENCOUNTER — Encounter: Payer: Self-pay | Admitting: Pain Medicine

## 2018-03-02 ENCOUNTER — Ambulatory Visit: Payer: Medicare Other | Attending: Pain Medicine | Admitting: Pain Medicine

## 2018-03-02 ENCOUNTER — Other Ambulatory Visit: Payer: Self-pay

## 2018-03-02 VITALS — BP 145/83 | HR 66 | Temp 98.0°F | Resp 18 | Ht 65.0 in | Wt 246.0 lb

## 2018-03-02 DIAGNOSIS — M797 Fibromyalgia: Secondary | ICD-10-CM | POA: Insufficient documentation

## 2018-03-02 DIAGNOSIS — Z79899 Other long term (current) drug therapy: Secondary | ICD-10-CM | POA: Insufficient documentation

## 2018-03-02 DIAGNOSIS — I129 Hypertensive chronic kidney disease with stage 1 through stage 4 chronic kidney disease, or unspecified chronic kidney disease: Secondary | ICD-10-CM | POA: Diagnosis not present

## 2018-03-02 DIAGNOSIS — M47819 Spondylosis without myelopathy or radiculopathy, site unspecified: Secondary | ICD-10-CM | POA: Diagnosis not present

## 2018-03-02 DIAGNOSIS — M069 Rheumatoid arthritis, unspecified: Secondary | ICD-10-CM | POA: Insufficient documentation

## 2018-03-02 DIAGNOSIS — M1712 Unilateral primary osteoarthritis, left knee: Secondary | ICD-10-CM | POA: Diagnosis not present

## 2018-03-02 DIAGNOSIS — Z880 Allergy status to penicillin: Secondary | ICD-10-CM | POA: Insufficient documentation

## 2018-03-02 DIAGNOSIS — M5136 Other intervertebral disc degeneration, lumbar region: Secondary | ICD-10-CM | POA: Diagnosis not present

## 2018-03-02 DIAGNOSIS — M47817 Spondylosis without myelopathy or radiculopathy, lumbosacral region: Secondary | ICD-10-CM

## 2018-03-02 DIAGNOSIS — K589 Irritable bowel syndrome without diarrhea: Secondary | ICD-10-CM | POA: Insufficient documentation

## 2018-03-02 DIAGNOSIS — G894 Chronic pain syndrome: Secondary | ICD-10-CM | POA: Insufficient documentation

## 2018-03-02 DIAGNOSIS — Z79891 Long term (current) use of opiate analgesic: Secondary | ICD-10-CM | POA: Insufficient documentation

## 2018-03-02 DIAGNOSIS — M792 Neuralgia and neuritis, unspecified: Secondary | ICD-10-CM | POA: Diagnosis not present

## 2018-03-02 DIAGNOSIS — K219 Gastro-esophageal reflux disease without esophagitis: Secondary | ICD-10-CM | POA: Insufficient documentation

## 2018-03-02 DIAGNOSIS — N183 Chronic kidney disease, stage 3 (moderate): Secondary | ICD-10-CM | POA: Diagnosis not present

## 2018-03-02 DIAGNOSIS — M545 Low back pain: Secondary | ICD-10-CM | POA: Insufficient documentation

## 2018-03-02 DIAGNOSIS — G8929 Other chronic pain: Secondary | ICD-10-CM

## 2018-03-02 DIAGNOSIS — K760 Fatty (change of) liver, not elsewhere classified: Secondary | ICD-10-CM | POA: Diagnosis not present

## 2018-03-02 DIAGNOSIS — M47816 Spondylosis without myelopathy or radiculopathy, lumbar region: Secondary | ICD-10-CM

## 2018-03-02 MED ORDER — GABAPENTIN 300 MG PO CAPS: 300.0000 mg | ORAL_CAPSULE | Freq: Two times a day (BID) | ORAL | 1 refills | Status: DC

## 2018-03-02 NOTE — Progress Notes (Signed)
Safety precautions to be maintained throughout the outpatient stay will include: orient to surroundings, keep bed in low position, maintain call bell within reach at all times, provide assistance with transfer out of bed and ambulation.  

## 2018-03-02 NOTE — Patient Instructions (Addendum)
____________________________________________________________________________________________  Preparing for Procedure with Sedation  Instructions: . Oral Intake: Do not eat or drink anything for at least 8 hours prior to your procedure. . Transportation: Public transportation is not allowed. Bring an adult driver. The driver must be physically present in our waiting room before any procedure can be started. . Physical Assistance: Bring an adult physically capable of assisting you, in the event you need help. This adult should keep you company at home for at least 6 hours after the procedure. . Blood Pressure Medicine: Take your blood pressure medicine with a sip of water the morning of the procedure. . Blood thinners: Notify our staff if you are taking any blood thinners. Depending on which one you take, there will be specific instructions on how and when to stop it. . Diabetics on insulin: Notify the staff so that you can be scheduled 1st case in the morning. If your diabetes requires high dose insulin, take only  of your normal insulin dose the morning of the procedure and notify the staff that you have done so. . Preventing infections: Shower with an antibacterial soap the morning of your procedure. . Build-up your immune system: Take 1000 mg of Vitamin C with every meal (3 times a day) the day prior to your procedure. . Antibiotics: Inform the staff if you have a condition or reason that requires you to take antibiotics before dental procedures. . Pregnancy: If you are pregnant, call and cancel the procedure. . Sickness: If you have a cold, fever, or any active infections, call and cancel the procedure. . Arrival: You must be in the facility at least 30 minutes prior to your scheduled procedure. . Children: Do not bring children with you. . Dress appropriately: Bring dark clothing that you would not mind if they get stained. . Valuables: Do not bring any jewelry or valuables.  Procedure  appointments are reserved for interventional treatments only. . No Prescription Refills. . No medication changes will be discussed during procedure appointments. . No disability issues will be discussed.  Reasons to call and reschedule or cancel your procedure: (Following these recommendations will minimize the risk of a serious complication.) . Surgeries: Avoid having procedures within 2 weeks of any surgery. (Avoid for 2 weeks before or after any surgery). . Flu Shots: Avoid having procedures within 2 weeks of a flu shots or . (Avoid for 2 weeks before or after immunizations). . Barium: Avoid having a procedure within 7-10 days after having had a radiological study involving the use of radiological contrast. (Myelograms, Barium swallow or enema study). . Heart attacks: Avoid any elective procedures or surgeries for the initial 6 months after a "Myocardial Infarction" (Heart Attack). . Blood thinners: It is imperative that you stop these medications before procedures. Let us know if you if you take any blood thinner.  . Infection: Avoid procedures during or within two weeks of an infection (including chest colds or gastrointestinal problems). Symptoms associated with infections include: Localized redness, fever, chills, night sweats or profuse sweating, burning sensation when voiding, cough, congestion, stuffiness, runny nose, sore throat, diarrhea, nausea, vomiting, cold or Flu symptoms, recent or current infections. It is specially important if the infection is over the area that we intend to treat. . Heart and lung problems: Symptoms that may suggest an active cardiopulmonary problem include: cough, chest pain, breathing difficulties or shortness of breath, dizziness, ankle swelling, uncontrolled high or unusually low blood pressure, and/or palpitations. If you are experiencing any of these symptoms, cancel   your procedure and contact your primary care physician for an evaluation.  Remember:   Regular Business hours are:  Monday to Thursday 8:00 AM to 4:00 PM  Provider's Schedule: Milinda Pointer, MD:  Procedure days: Tuesday and Thursday 7:30 AM to 4:00 PM  Gillis Santa, MD:  Procedure days: Monday and Wednesday 7:30 AM to 4:00 PM ____________________________________________________________________________________________   GENERAL RISKS AND COMPLICATIONS  What are the risk, side effects and possible complications? Generally speaking, most procedures are safe.  However, with any procedure there are risks, side effects, and the possibility of complications.  The risks and complications are dependent upon the sites that are lesioned, or the type of nerve block to be performed.  The closer the procedure is to the spine, the more serious the risks are.  Great care is taken when placing the radio frequency needles, block needles or lesioning probes, but sometimes complications can occur. 1. Infection: Any time there is an injection through the skin, there is a risk of infection.  This is why sterile conditions are used for these blocks.  There are four possible types of infection. 1. Localized skin infection. 2. Central Nervous System Infection-This can be in the form of Meningitis, which can be deadly. 3. Epidural Infections-This can be in the form of an epidural abscess, which can cause pressure inside of the spine, causing compression of the spinal cord with subsequent paralysis. This would require an emergency surgery to decompress, and there are no guarantees that the patient would recover from the paralysis. 4. Discitis-This is an infection of the intervertebral discs.  It occurs in about 1% of discography procedures.  It is difficult to treat and it may lead to surgery.        2. Pain: the needles have to go through skin and soft tissues, will cause soreness.       3. Damage to internal structures:  The nerves to be lesioned may be near blood vessels or    other nerves which  can be potentially damaged.       4. Bleeding: Bleeding is more common if the patient is taking blood thinners such as  aspirin, Coumadin, Ticiid, Plavix, etc., or if he/she have some genetic predisposition  such as hemophilia. Bleeding into the spinal canal can cause compression of the spinal  cord with subsequent paralysis.  This would require an emergency surgery to  decompress and there are no guarantees that the patient would recover from the  paralysis.       5. Pneumothorax:  Puncturing of a lung is a possibility, every time a needle is introduced in  the area of the chest or upper back.  Pneumothorax refers to free air around the  collapsed lung(s), inside of the thoracic cavity (chest cavity).  Another two possible  complications related to a similar event would include: Hemothorax and Chylothorax.   These are variations of the Pneumothorax, where instead of air around the collapsed  lung(s), you may have blood or chyle, respectively.       6. Spinal headaches: They may occur with any procedures in the area of the spine.       7. Persistent CSF (Cerebro-Spinal Fluid) leakage: This is a rare problem, but may occur  with prolonged intrathecal or epidural catheters either due to the formation of a fistulous  track or a dural tear.       8. Nerve damage: By working so close to the spinal cord, there is always a possibility  of  nerve damage, which could be as serious as a permanent spinal cord injury with  paralysis.       9. Death:  Although rare, severe deadly allergic reactions known as "Anaphylactic  reaction" can occur to any of the medications used.      10. Worsening of the symptoms:  We can always make thing worse.  What are the chances of something like this happening? Chances of any of this occuring are extremely low.  By statistics, you have more of a chance of getting killed in a motor vehicle accident: while driving to the hospital than any of the above occurring .  Nevertheless, you  should be aware that they are possibilities.  In general, it is similar to taking a shower.  Everybody knows that you can slip, hit your head and get killed.  Does that mean that you should not shower again?  Nevertheless always keep in mind that statistics do not mean anything if you happen to be on the wrong side of them.  Even if a procedure has a 1 (one) in a 1,000,000 (million) chance of going wrong, it you happen to be that one..Also, keep in mind that by statistics, you have more of a chance of having something go wrong when taking medications.  Who should not have this procedure? If you are on a blood thinning medication (e.g. Coumadin, Plavix, see list of "Blood Thinners"), or if you have an active infection going on, you should not have the procedure.  If you are taking any blood thinners, please inform your physician.  How should I prepare for this procedure?  Do not eat or drink anything at least six hours prior to the procedure.  Bring a driver with you .  It cannot be a taxi.  Come accompanied by an adult that can drive you back, and that is strong enough to help you if your legs get weak or numb from the local anesthetic.  Take all of your medicines the morning of the procedure with just enough water to swallow them.  If you have diabetes, make sure that you are scheduled to have your procedure done first thing in the morning, whenever possible.  If you have diabetes, take only half of your insulin dose and notify our nurse that you have done so as soon as you arrive at the clinic.  If you are diabetic, but only take blood sugar pills (oral hypoglycemic), then do not take them on the morning of your procedure.  You may take them after you have had the procedure.  Do not take aspirin or any aspirin-containing medications, at least eleven (11) days prior to the procedure.  They may prolong bleeding.  Wear loose fitting clothing that may be easy to take off and that you would not  mind if it got stained with Betadine or blood.  Do not wear any jewelry or perfume  Remove any nail coloring.  It will interfere with some of our monitoring equipment.  NOTE: Remember that this is not meant to be interpreted as a complete list of all possible complications.  Unforeseen problems may occur.  BLOOD THINNERS The following drugs contain aspirin or other products, which can cause increased bleeding during surgery and should not be taken for 2 weeks prior to and 1 week after surgery.  If you should need take something for relief of minor pain, you may take acetaminophen which is found in Tylenol,m Datril, Anacin-3 and Panadol. It is  not blood thinner. The products listed below are.  Do not take any of the products listed below in addition to any listed on your instruction sheet.  A.P.C or A.P.C with Codeine Codeine Phosphate Capsules #3 Ibuprofen Ridaura  ABC compound Congesprin Imuran rimadil  Advil Cope Indocin Robaxisal  Alka-Seltzer Effervescent Pain Reliever and Antacid Coricidin or Coricidin-D  Indomethacin Rufen  Alka-Seltzer plus Cold Medicine Cosprin Ketoprofen S-A-C Tablets  Anacin Analgesic Tablets or Capsules Coumadin Korlgesic Salflex  Anacin Extra Strength Analgesic tablets or capsules CP-2 Tablets Lanoril Salicylate  Anaprox Cuprimine Capsules Levenox Salocol  Anexsia-D Dalteparin Magan Salsalate  Anodynos Darvon compound Magnesium Salicylate Sine-off  Ansaid Dasin Capsules Magsal Sodium Salicylate  Anturane Depen Capsules Marnal Soma  APF Arthritis pain formula Dewitt's Pills Measurin Stanback  Argesic Dia-Gesic Meclofenamic Sulfinpyrazone  Arthritis Bayer Timed Release Aspirin Diclofenac Meclomen Sulindac  Arthritis pain formula Anacin Dicumarol Medipren Supac  Analgesic (Safety coated) Arthralgen Diffunasal Mefanamic Suprofen  Arthritis Strength Bufferin Dihydrocodeine Mepro Compound Suprol  Arthropan liquid Dopirydamole Methcarbomol with Aspirin Synalgos   ASA tablets/Enseals Disalcid Micrainin Tagament  Ascriptin Doan's Midol Talwin  Ascriptin A/D Dolene Mobidin Tanderil  Ascriptin Extra Strength Dolobid Moblgesic Ticlid  Ascriptin with Codeine Doloprin or Doloprin with Codeine Momentum Tolectin  Asperbuf Duoprin Mono-gesic Trendar  Aspergum Duradyne Motrin or Motrin IB Triminicin  Aspirin plain, buffered or enteric coated Durasal Myochrisine Trigesic  Aspirin Suppositories Easprin Nalfon Trillsate  Aspirin with Codeine Ecotrin Regular or Extra Strength Naprosyn Uracel  Atromid-S Efficin Naproxen Ursinus  Auranofin Capsules Elmiron Neocylate Vanquish  Axotal Emagrin Norgesic Verin  Azathioprine Empirin or Empirin with Codeine Normiflo Vitamin E  Azolid Emprazil Nuprin Voltaren  Bayer Aspirin plain, buffered or children's or timed BC Tablets or powders Encaprin Orgaran Warfarin Sodium  Buff-a-Comp Enoxaparin Orudis Zorpin  Buff-a-Comp with Codeine Equegesic Os-Cal-Gesic   Buffaprin Excedrin plain, buffered or Extra Strength Oxalid   Bufferin Arthritis Strength Feldene Oxphenbutazone   Bufferin plain or Extra Strength Feldene Capsules Oxycodone with Aspirin   Bufferin with Codeine Fenoprofen Fenoprofen Pabalate or Pabalate-SF   Buffets II Flogesic Panagesic   Buffinol plain or Extra Strength Florinal or Florinal with Codeine Panwarfarin   Buf-Tabs Flurbiprofen Penicillamine   Butalbital Compound Four-way cold tablets Penicillin   Butazolidin Fragmin Pepto-Bismol   Carbenicillin Geminisyn Percodan   Carna Arthritis Reliever Geopen Persantine   Carprofen Gold's salt Persistin   Chloramphenicol Goody's Phenylbutazone   Chloromycetin Haltrain Piroxlcam   Clmetidine heparin Plaquenil   Cllnoril Hyco-pap Ponstel   Clofibrate Hydroxy chloroquine Propoxyphen         Before stopping any of these medications, be sure to consult the physician who ordered them.  Some, such as Coumadin (Warfarin) are ordered to prevent or treat serious  conditions such as "deep thrombosis", "pumonary embolisms", and other heart problems.  The amount of time that you may need off of the medication may also vary with the medication and the reason for which you were taking it.  If you are taking any of these medications, please make sure you notify your pain physician before you undergo any procedures.         Pain Management Discharge Instructions  General Discharge Instructions :  If you need to reach your doctor call: Monday-Friday 8:00 am - 4:00 pm at [phone] or toll free [phone].  After clinic hours [phone] to have operator reach doctor.  Bring all of your medication bottles to all your appointments in the pain clinic.  To cancel or reschedule  your appointment with Pain Management please remember to call 24 hours in advance to avoid a fee.  Refer to the educational materials which you have been given on: General Risks, I had my Procedure. Discharge Instructions, Post Sedation.  Post Procedure Instructions:  The drugs you were given will stay in your system until tomorrow, so for the next 24 hours you should not drive, make any legal decisions or drink any alcoholic beverages.  You may eat anything you prefer, but it is better to start with liquids then soups and crackers, and gradually work up to solid foods.  Please notify your doctor immediately if you have any unusual bleeding, trouble breathing or pain that is not related to your normal pain.  Depending on the type of procedure that was done, some parts of your body may feel week and/or numb.  This usually clears up by tonight or the next day.  Walk with the use of an assistive device or accompanied by an adult for the 24 hours.  You may use ice on the affected area for the first 24 hours.  Put ice in a Ziploc bag and cover with a towel and place against area 15 minutes on 15 minutes off.  You may switch to heat after 24 hours.Facet Blocks Patient  Information  Description: The facets are joints in the spine between the vertebrae.  Like any joints in the body, facets can become irritated and painful.  Arthritis can also effect the facets.  By injecting steroids and local anesthetic in and around these joints, we can temporarily block the nerve supply to them.  Steroids act directly on irritated nerves and tissues to reduce selling and inflammation which often leads to decreased pain.  Facet blocks may be done anywhere along the spine from the neck to the low back depending upon the location of your pain.   After numbing the skin with local anesthetic (like Novocaine), a small needle is passed onto the facet joints under x-ray guidance.  You may experience a sensation of pressure while this is being done.  The entire block usually lasts about 15-25 minutes.   Conditions which may be treated by facet blocks:   Low back/buttock pain  Neck/shoulder pain  Certain types of headaches  Preparation for the injection:  1. Do not eat any solid food or dairy products within 8 hours of your appointment. 2. You may drink clear liquid up to 3 hours before appointment.  Clear liquids include water, black coffee, juice or soda.  No milk or cream please. 3. You may take your regular medication, including pain medications, with a sip of water before your appointment.  Diabetics should hold regular insulin (if taken separately) and take 1/2 normal NPH dose the morning of the procedure.  Carry some sugar containing items with you to your appointment. 4. A driver must accompany you and be prepared to drive you home after your procedure. 5. Bring all your current medications with you. 6. An IV may be inserted and sedation may be given at the discretion of the physician. 7. A blood pressure cuff, EKG and other monitors will often be applied during the procedure.  Some patients may need to have extra oxygen administered for a short period. 8. You will be asked  to provide medical information, including your allergies and medications, prior to the procedure.  We must know immediately if you are taking blood thinners (like Coumadin/Warfarin) or if you are allergic to IV iodine contrast (dye).  We must know if you could possible be pregnant.  Possible side-effects:   Bleeding from needle site  Infection (rare, may require surgery)  Nerve injury (rare)  Numbness & tingling (temporary)  Difficulty urinating (rare, temporary)  Spinal headache (a headache worse with upright posture)  Light-headedness (temporary)  Pain at injection site (serveral days)  Decreased blood pressure (rare, temporary)  Weakness in arm/leg (temporary)  Pressure sensation in back/neck (temporary)   Call if you experience:   Fever/chills associated with headache or increased back/neck pain  Headache worsened by an upright position  New onset, weakness or numbness of an extremity below the injection site  Hives or difficulty breathing (go to the emergency room)  Inflammation or drainage at the injection site(s)  Severe back/neck pain greater than usual  New symptoms which are concerning to you  Please note:  Although the local anesthetic injected can often make your back or neck feel good for several hours after the injection, the pain will likely return. It takes 3-7 days for steroids to work.  You may not notice any pain relief for at least one week.  If effective, we will often do a series of 2-3 injections spaced 3-6 weeks apart to maximally decrease your pain.  After the initial series, you may be a candidate for a more permanent nerve block of the facets.  If you have any questions, please call #[phone] [facility]

## 2018-03-09 ENCOUNTER — Ambulatory Visit
Admission: RE | Admit: 2018-03-09 | Discharge: 2018-03-09 | Disposition: A | Discharge status: Home / Self Care | Payer: Medicare Other | Source: Ambulatory Visit | Attending: Emergency Medicine | Admitting: Emergency Medicine

## 2018-03-09 DIAGNOSIS — Z01810 Encounter for preprocedural cardiovascular examination: Secondary | ICD-10-CM | POA: Diagnosis present

## 2018-05-21 ENCOUNTER — Encounter: Payer: Self-pay | Admitting: Pain Medicine

## 2018-05-21 ENCOUNTER — Other Ambulatory Visit: Payer: Self-pay

## 2018-05-21 ENCOUNTER — Ambulatory Visit: Payer: Medicare Other | Attending: Pain Medicine | Admitting: Pain Medicine

## 2018-05-21 VITALS — BP 112/67 | HR 72 | Temp 98.6°F | Resp 16 | Ht 65.0 in | Wt 240.0 lb

## 2018-05-21 DIAGNOSIS — G8929 Other chronic pain: Secondary | ICD-10-CM

## 2018-05-21 DIAGNOSIS — M25561 Pain in right knee: Secondary | ICD-10-CM

## 2018-05-21 DIAGNOSIS — M25562 Pain in left knee: Secondary | ICD-10-CM

## 2018-05-21 DIAGNOSIS — M170 Bilateral primary osteoarthritis of knee: Secondary | ICD-10-CM | POA: Insufficient documentation

## 2018-05-21 MED ORDER — SODIUM HYALURONATE (VISCOSUP) 20 MG/2ML IX SOSY
2.0000 mL | PREFILLED_SYRINGE | Freq: Once | INTRA_ARTICULAR | Status: AC
  Administered 2018-05-21: 2 mL via INTRA_ARTICULAR

## 2018-05-21 MED ORDER — LIDOCAINE HCL (PF) 1 % IJ SOLN
INTRAMUSCULAR | Status: AC
  Filled 2018-05-21: qty 5

## 2018-05-21 MED ORDER — ROPIVACAINE HCL 2 MG/ML IJ SOLN
4.0000 mL | Freq: Once | INTRAMUSCULAR | Status: AC
  Administered 2018-05-21: 10 mL via INTRA_ARTICULAR

## 2018-05-21 MED ORDER — LIDOCAINE HCL (PF) 1 % IJ SOLN
4.0000 mL | Freq: Once | INTRAMUSCULAR | Status: AC
  Administered 2018-05-21: 5 mL

## 2018-05-21 MED ORDER — ROPIVACAINE HCL 2 MG/ML IJ SOLN
INTRAMUSCULAR | Status: AC
  Filled 2018-05-21: qty 10

## 2018-05-21 NOTE — Progress Notes (Signed)
Safety precautions to be maintained throughout the outpatient stay will include: orient to surroundings, keep bed in low position, maintain call bell within reach at all times, provide assistance with transfer out of bed and ambulation.  

## 2018-05-21 NOTE — Patient Instructions (Addendum)
____________________________________________________________________________________________  Post-Procedure Discharge Instructions  Instructions:  Apply ice: Fill a plastic sandwich bag with crushed ice. Cover it with a small towel and apply to injection site. Apply for 15 minutes then remove x 15 minutes. Repeat sequence on day of procedure, until you go to bed. The purpose is to minimize swelling and discomfort after procedure.  Apply heat: Apply heat to procedure site starting the day following the procedure. The purpose is to treat any soreness and discomfort from the procedure.  Food intake: Start with clear liquids (like water) and advance to regular food, as tolerated.   Physical activities: Keep activities to a minimum for the first 8 hours after the procedure.   Driving: If you have received any sedation, you are not allowed to drive for 24 hours after your procedure.  Blood thinner: Restart your blood thinner 6 hours after your procedure. (Only for those taking blood thinners)  Insulin: As soon as you can eat, you may resume your normal dosing schedule. (Only for those taking insulin)  Infection prevention: Keep procedure site clean and dry.  Post-procedure Pain Diary: Extremely important that this be done correctly and accurately. Recorded information will be used to determine the next step in treatment.  Pain evaluated is that of treated area only. Do not include pain from an untreated area.  Complete every hour, on the hour, for the initial 8 hours. Set an alarm to help you do this part accurately.  Do not go to sleep and have it completed later. It will not be accurate.  Follow-up appointment: Keep your follow-up appointment after the procedure. Usually 2 weeks for most procedures. (6 weeks in the case of radiofrequency.) Bring you pain diary.   Expect:  From numbing medicine (AKA: Local Anesthetics): Numbness or decrease in pain.  Onset: Full effect within 15  minutes of injected.  Duration: It will depend on the type of local anesthetic used. On the average, 1 to 8 hours.   From steroids: Decrease in swelling or inflammation. Once inflammation is improved, relief of the pain will follow.  Onset of benefits: Depends on the amount of swelling present. The more swelling, the longer it will take for the benefits to be seen. In some cases, up to 10 days.  Duration: Steroids will stay in the system x 2 weeks. Duration of benefits will depend on multiple posibilities including persistent irritating factors.  Occasional side-effects: Facial flushing, cramps (if present, drink Gatorade and take over-the-counter Magnesium 450-500 mg once to twice a day).  From procedure: Some discomfort is to be expected once the numbing medicine wears off. This should be minimal if ice and heat are applied as instructed.  Call if:  You experience numbness and weakness that gets worse with time, as opposed to wearing off.  New onset bowel or bladder incontinence. (This applies to Spinal procedures only)  Emergency Numbers:  Durning business hours (Monday - Thursday, 8:00 AM - 4:00 PM) (Friday, 9:00 AM - 12:00 Noon): [phone]  After hours: [phone] ____________________________________________________________________________________________  Post-procedure Information What to expect: Most procedures involve the use of a local anesthetic (numbing medicine), and a steroid (anti-inflammatory medicine).  The local anesthetics may cause temporary numbness and weakness of the legs or arms, depending on the location of the block. This numbness/weakness may last 4-6 hours, depending on the local anesthetic used. In rare instances, it can last up to 24 hours. While numb, you must be very careful not to injure the extremity.  After  any procedure, you could expect the pain to get better within 15-20 minutes. This relief is temporary and may last 4-6 hours. Once the  local anesthetics wears off, you could experience discomfort, possibly more than usual, for up to 10 (ten) days. In the case of radiofrequencies, it may last up to 6 weeks. Surgeries may take up to 8 weeks for the healing process. The discomfort is due to the irritation caused by needles going through skin and muscle. To minimize the discomfort, we recommend using ice the first day, and heat from then on. The ice should be applied for 15 minutes on, and 15 minutes off. Keep repeating this cycle until bedtime. Avoid applying the ice directly to the skin, to prevent frostbite. Heat should be used daily, until the pain improves (4-10 days). Be careful not to burn yourself.  Occasionally you may experience muscle spasms or cramps. These occur as a consequence of the irritation caused by the needle sticks to the muscle and the blood that will inevitably be lost into the surrounding muscle tissue. Blood tends to be very irritating to tissues, which tend to react by going into spasm. These spasms may start the same day of your procedure, but they may also take days to develop. This late onset type of spasm or cramp is usually caused by electrolyte imbalances triggered by the steroids, at the level of the kidney. Cramps and spasms tend to respond well to muscle relaxants, multivitamins (some are triggered by the procedure, but may have their origins in vitamin deficiencies), and "Gatorade", or any sports drinks that can replenish any electrolyte imbalances. (If you are a diabetic, ask your pharmacist to get you a sugar-free brand.) Warm showers or baths may also be helpful. Stretching exercises are highly recommended. General Instructions:  Be alert for signs of possible infection: redness, swelling, heat, red streaks, elevated temperature, and/or fever. These typically appear 4 to 6 days after the procedure. Immediately notify your doctor if you experience unusual bleeding, difficulty breathing, or loss of bowel or  bladder control. If you experience increased pain, do not increase your pain medicine intake, unless instructed by your pain physician. Post-Procedure Care:  Be careful in moving about. Muscle spasms in the area of the injection may occur. Applying ice or heat to the area is often helpful. The incidence of spinal headaches after epidural injections ranges between 1.4% and 6%. If you develop a headache that does not seem to respond to conservative therapy, please let your physician know. This can be treated with an epidural blood patch.   Post-procedure numbness or redness is to be expected, however it should average 4 to 6 hours. If numbness and weakness of your extremities begins to develop 4 to 6 hours after your procedure, and is felt to be progressing and worsening, immediately contact your physician.   Diet:  If you experience nausea, do not eat until this sensation goes away. If you had a "Stellate Ganglion Block" for upper extremity "Reflex Sympathetic Dystrophy", do not eat or drink until your hoarseness goes away. In any case, always start with liquids first and if you tolerate them well, then slowly progress to more solid foods. Activity:  For the first 4 to 6 hours after the procedure, use caution in moving about as you may experience numbness and/or weakness. Use caution in cooking, using household electrical appliances, and climbing steps. If you need to reach your Doctor call our office: [phone] Monday-Thursday 8:00 am - 4:00 PM  Fridays: Closed     In case of an emergency: In case of emergency, call 911 or go to the nearest emergency room and have the physician there call us.  Interpretation of Procedure Every nerve block has two components: a diagnostic component, and a treatment component. Unrealistic expectations are the most common causes of "perceived failure".  In a perfect world, a single nerve block should be able to completely and permanently eliminate the pain.  Sadly, the world is not perfect.  Most pain management nerve blocks are performed using local anesthetics and steroids. Steroids are responsible for any long-term benefit that you may experience. Their purpose is to decrease any chronic swelling that may exist in the area. Steroids begin to work immediately after being injected. However, most patients will not experience any benefits until 5 to 10 days after the injection, when the swelling has come down to the point where they can tell a difference. Steroids will only help if there is swelling to be treated. As such, they can assist with the diagnosis. If effective, they suggest an inflammatory component to the pain, and if ineffective, they rule out inflammation as the main cause or component of the problem. If the problem is one of mechanical compression, you will get no benefit from those steroids.   In the case of local anesthetics, they have a crucial role in the diagnosis of your condition. Most will begin to work within15 to 20 minutes after injection. The duration will depend on the type used (short- vs. Long-acting). It is of outmost importance that patients keep tract of their pain, after the procedure. To assist with this matter, a "Post-procedure Pain Diary" is provided. Make sure to complete it and to bring it back to your follow-up appointment.  As long as the patient keeps accurate, detailed records of their symptoms after every procedure, and returns to have those interpreted, every procedure will provide [place] with invaluable information. Even a block that does not provide the patient with any relief, will always provide [place] with information about the mechanism and the origin of the pain. The only time a nerve block can be considered a waste of time is when patients do not keep track of the results, or do not keep their post-procedure appointment.  Reporting the results back to your physician The Pain Score  Pain is a subjective complaint. It  cannot be seen, touched, or measured. We depend entirely on the patient's report of the pain in order to assess your condition and treatment. To evaluate the pain, we use a pain scale, where "0" means "No Pain", and a "10" is "the worst possible pain that you can even imagine" (i.e. something like been eaten alive by a shark or being torn apart by a lion).   You will frequently be asked to rate your pain. Please be as accurate, remember that medical decisions will be based on your responses. Please do not rate your pain above a 10. Doing so is actually interpreted as "symptom magnification" (exaggeration), as well as lack of understanding with regards to the scale. To put this into perspective, when you tell us that your pain is at a 10 (ten), what you are saying is that there is nothing we can do to make this pain any worse. (Carefully think about that.)

## 2018-05-21 NOTE — Progress Notes (Signed)
Patient's Name: Sabrina Green  MRN: [id number]  Referring Provider: Alanson Aly, FNP  DOB: Mar 06, 1946  PCP: Alanson Aly, FNP  DOS: 05/21/2018  Note by: Gaspar Cola, MD  Service setting: Ambulatory outpatient  Specialty: Interventional Pain Management  Patient type: Established  Location: [facility] (AMB) Pain Management Facility  Visit type: Interventional Procedure   Primary Reason for Visit: Interventional Pain Management Treatment. CC: Knee Pain (bilateral)  Procedure:          Anesthesia, Analgesia, Anxiolysis:  Type: Therapeutic Intra-Articular Hyalgan Knee Injection #1  Region: Lateral infrapatellar Knee Region Level: Knee Joint Laterality: Bilateral  Type: Local Anesthesia Indication(s): Analgesia         Local Anesthetic: Lidocaine 1-2% Route: Infiltration (Cochran/IM) IV Access: Declined Sedation: Declined   Position: Sitting   Indications: 1. Osteoarthritis of knees (Bilateral)   2. Tricompartmental disease of knee (Bilateral)   3. Chronic knee pain (Primary Area of Pain) (Bilateral) (L>R)    Pain Score: Pre-procedure: 6 /10 Post-procedure: 0-No pain/10  Pre-op Assessment:  Sabrina Green is a 72 y.o. (year old), female patient, seen today for interventional treatment. She  has a past surgical history that includes Tonsillectomy; Cesarean section; Tubal ligation; Abdominal hysterectomy; Knee arthroscopy; Cholecystectomy; Abdominal hysterectomy; Ankle fracture surgery (Right, 1989); and Eye surgery (Bilateral, 2015). Sabrina Green has a current medication list which includes the following prescription(s): allopurinol, calcium-vitamin d, cholecalciferol, diazepam, fluticasone, furosemide, gabapentin, losartan, lubiprostone, methotrexate, mupirocin ointment, omeprazole, promethazine, propranolol, sertraline, and tizanidine. Her primarily concern today is the Knee Pain (bilateral)  Initial Vital Signs:  Pulse/HCG Rate: 66  Temp: 98.6 F (37 C) Resp: 16 BP:  122/87 SpO2: 97 %  BMI: Estimated body mass index is 39.94 kg/m as calculated from the following:   Height as of this encounter: 5\' 5"  (1.651 m).   Weight as of this encounter: 240 lb (108.9 kg).  Risk Assessment: Allergies: Reviewed. She is allergic to penicillins.  Allergy Precautions: None required Coagulopathies: Reviewed. None identified.  Blood-thinner therapy: None at this time Active Infection(s): Reviewed. None identified. Sabrina Green is afebrile  Site Confirmation: Sabrina Green was asked to confirm the procedure and laterality before marking the site Procedure checklist: Completed Consent: Before the procedure and under the influence of no sedative(s), amnesic(s), or anxiolytics, the patient was informed of the treatment options, risks and possible complications. To fulfill our ethical and legal obligations, as recommended by the American Medical Association's Code of Ethics, I have informed the patient of my clinical impression; the nature and purpose of the treatment or procedure; the risks, benefits, and possible complications of the intervention; the alternatives, including doing nothing; the risk(s) and benefit(s) of the alternative treatment(s) or procedure(s); and the risk(s) and benefit(s) of doing nothing. The patient was provided information about the general risks and possible complications associated with the procedure. These may include, but are not limited to: failure to achieve desired goals, infection, bleeding, organ or nerve damage, allergic reactions, paralysis, and death. In addition, the patient was informed of those risks and complications associated to the procedure, such as failure to decrease pain; infection; bleeding; organ or nerve damage with subsequent damage to sensory, motor, and/or autonomic systems, resulting in permanent pain, numbness, and/or weakness of one or several areas of the body; allergic reactions; (i.e.: anaphylactic reaction); and/or  death. Furthermore, the patient was informed of those risks and complications associated with the medications. These include, but are not limited to: allergic reactions (i.e.: anaphylactic or anaphylactoid reaction(s)); adrenal axis suppression;  blood sugar elevation that in diabetics may result in ketoacidosis or comma; water retention that in patients with history of congestive heart failure may result in shortness of breath, pulmonary edema, and decompensation with resultant heart failure; weight gain; swelling or edema; medication-induced neural toxicity; particulate matter embolism and blood vessel occlusion with resultant organ, and/or nervous system infarction; and/or aseptic necrosis of one or more joints. Finally, the patient was informed that Medicine is not an exact science; therefore, there is also the possibility of unforeseen or unpredictable risks and/or possible complications that may result in a catastrophic outcome. The patient indicated having understood very clearly. We have given the patient no guarantees and we have made no promises. Enough time was given to the patient to ask questions, all of which were answered to the patient's satisfaction. Sabrina Green has indicated that she wanted to continue with the procedure. Attestation: I, the ordering provider, attest that I have discussed with the patient the benefits, risks, side-effects, alternatives, likelihood of achieving goals, and potential problems during recovery for the procedure that I have provided informed consent. Date  Time: 05/21/2018  8:52 AM  Pre-Procedure Preparation:  Monitoring: As per clinic protocol. Respiration, ETCO2, SpO2, BP, heart rate and rhythm monitor placed and checked for adequate function Safety Precautions: Patient was assessed for positional comfort and pressure points before starting the procedure. Time-out: I initiated and conducted the "Time-out" before starting the procedure, as per protocol. The  patient was asked to participate by confirming the accuracy of the "Time Out" information. Verification of the correct person, site, and procedure were performed and confirmed by me, the nursing staff, and the patient. "Time-out" conducted as per Joint Commission's Universal Protocol (UP.01.01.01). Time: 0916  Description of Procedure:          Target Area: Knee Joint Approach: Just above the Lateral tibial plateau, lateral to the infrapatellar tendon. Area Prepped: Entire knee area, from the mid-thigh to the mid-shin. Prepping solution: ChloraPrep (2% chlorhexidine gluconate and 70% isopropyl alcohol) Safety Precautions: Aspiration looking for blood return was conducted prior to all injections. At no point did we inject any substances, as a needle was being advanced. No attempts were made at seeking any paresthesias. Safe injection practices and needle disposal techniques used. Medications properly checked for expiration dates. SDV (single dose vial) medications used. Description of the Procedure: Protocol guidelines were followed. The patient was placed in position over the fluoroscopy table. The target area was identified and the area prepped in the usual manner. Skin & deeper tissues infiltrated with local anesthetic. Appropriate amount of time allowed to pass for local anesthetics to take effect. The procedure needles were then advanced to the target area. Proper needle placement secured. Negative aspiration confirmed. Solution injected in intermittent fashion, asking for systemic symptoms every 0.5cc of injectate. The needles were then removed and the area cleansed, making sure to leave some of the prepping solution back to take advantage of its long term bactericidal properties. Vitals:   05/21/18 0850 05/21/18 0852 05/21/18 0920  BP:  122/87 112/67  Pulse:  66 72  Resp:  16 16  Temp: 98.6 F (37 C) 98.6 F (37 C)   SpO2:  97% 97%  Weight: 240 lb (108.9 kg)    Height: 5\' 5"  (1.651 m)       Start Time: 0916 hrs. End Time: 0920 hrs. Materials:  Needle(s) Type: Regular needle Gauge: 25G Length: 1.5-in Medication(s): Please see orders for medications and dosing details.  Imaging Guidance:  Type of Imaging Technique: None used Indication(s): N/A Exposure Time: No patient exposure Contrast: None used. Fluoroscopic Guidance: N/A Ultrasound Guidance: N/A Interpretation: N/A  Antibiotic Prophylaxis:   Anti-infectives (From admission, onward)   None     Indication(s): None identified  Post-operative Assessment:  Post-procedure Vital Signs:  Pulse/HCG Rate: 72  Temp: 98.6 F (37 C) Resp: 16 BP: 112/67 SpO2: 97 %  EBL: None  Complications: No immediate post-treatment complications observed by team, or reported by patient.  Note: The patient tolerated the entire procedure well. A repeat set of vitals were taken after the procedure and the patient was kept under observation following institutional policy, for this type of procedure. Post-procedural neurological assessment was performed, showing return to baseline, prior to discharge. The patient was provided with post-procedure discharge instructions, including a section on how to identify potential problems. Should any problems arise concerning this procedure, the patient was given instructions to immediately contact us, at any time, without hesitation. In any case, we plan to contact the patient by telephone for a follow-up status report regarding this interventional procedure.  Comments:  No additional relevant information.  Plan of Care   Imaging Orders  No imaging studies ordered today    Procedure Orders     KNEE INJECTION     KNEE INJECTION  Medications ordered for procedure: Meds ordered this encounter  Medications  . lidocaine (PF) (XYLOCAINE) 1 % injection 4 mL  . ropivacaine (PF) 2 mg/mL (0.2%) (NAROPIN) injection 4 mL  . Sodium Hyaluronate SOSY 2 mL  . Sodium Hyaluronate SOSY 2 mL    Medications administered: We administered lidocaine (PF), ropivacaine (PF) 2 mg/mL (0.2%), Sodium Hyaluronate, and Sodium Hyaluronate.  See the medical record for exact dosing, route, and time of administration.  Disposition: Discharge home  Discharge Date & Time: 05/21/2018; 0924 hrs.   Physician-requested Follow-up: Return for PPE (2 wks) + Procedure (no sedation): (B) Hyalgan #2.  No future appointments. Primary Care Physician: Alanson Aly, FNP Location: [facility] Note by: Gaspar Cola, MD Date: 05/21/2018; Time: 9:28 AM  Disclaimer:  Medicine is not an Chief Strategy Officer. The only guarantee in medicine is that nothing is guaranteed. It is important to note that the decision to proceed with this intervention was based on the information collected from the patient. The Data and conclusions were drawn from the patient's questionnaire, the interview, and the physical examination. Because the information was provided in large part by the patient, it cannot be guaranteed that it has not been purposely or unconsciously manipulated. Every effort has been made to obtain as much relevant data as possible for this evaluation. It is important to note that the conclusions that lead to this procedure are derived in large part from the available data. Always take into account that the treatment will also be dependent on availability of resources and existing treatment guidelines, considered by other Pain Management Practitioners as being common knowledge and practice, at the time of the intervention. For Medico-Legal purposes, it is also important to point out that variation in procedural techniques and pharmacological choices are the acceptable norm. The indications, contraindications, technique, and results of the above procedure should only be interpreted and judged by a Board-Certified Interventional Pain Specialist with extensive familiarity and expertise in the  same exact procedure and technique.

## 2018-05-22 ENCOUNTER — Telehealth: Payer: Self-pay

## 2018-05-22 NOTE — Telephone Encounter (Signed)
Post procedure phone call.  Patient states she is doing fine.  

## 2018-06-09 ENCOUNTER — Other Ambulatory Visit: Payer: Self-pay

## 2018-06-09 ENCOUNTER — Ambulatory Visit: Payer: Medicare Other | Attending: Pain Medicine | Admitting: Pain Medicine

## 2018-06-09 ENCOUNTER — Encounter: Payer: Self-pay | Admitting: Pain Medicine

## 2018-06-09 VITALS — BP 136/59 | HR 65 | Temp 98.2°F | Resp 14 | Ht 65.0 in | Wt 245.0 lb

## 2018-06-09 DIAGNOSIS — M170 Bilateral primary osteoarthritis of knee: Secondary | ICD-10-CM | POA: Diagnosis not present

## 2018-06-09 DIAGNOSIS — Z90710 Acquired absence of both cervix and uterus: Secondary | ICD-10-CM | POA: Diagnosis not present

## 2018-06-09 DIAGNOSIS — Z79899 Other long term (current) drug therapy: Secondary | ICD-10-CM | POA: Insufficient documentation

## 2018-06-09 DIAGNOSIS — G8929 Other chronic pain: Secondary | ICD-10-CM

## 2018-06-09 DIAGNOSIS — Z880 Allergy status to penicillin: Secondary | ICD-10-CM | POA: Diagnosis not present

## 2018-06-09 DIAGNOSIS — M25562 Pain in left knee: Secondary | ICD-10-CM | POA: Diagnosis not present

## 2018-06-09 DIAGNOSIS — Z9049 Acquired absence of other specified parts of digestive tract: Secondary | ICD-10-CM | POA: Diagnosis not present

## 2018-06-09 DIAGNOSIS — Z7951 Long term (current) use of inhaled steroids: Secondary | ICD-10-CM | POA: Insufficient documentation

## 2018-06-09 DIAGNOSIS — M25561 Pain in right knee: Secondary | ICD-10-CM | POA: Diagnosis present

## 2018-06-09 MED ORDER — SODIUM HYALURONATE (VISCOSUP) 20 MG/2ML IX SOSY
2.0000 mL | PREFILLED_SYRINGE | Freq: Once | INTRA_ARTICULAR | Status: AC
  Administered 2018-06-09: 2 mL via INTRA_ARTICULAR

## 2018-06-09 MED ORDER — ROPIVACAINE HCL 2 MG/ML IJ SOLN
4.0000 mL | Freq: Once | INTRAMUSCULAR | Status: AC
  Administered 2018-06-09: 10 mL via INTRA_ARTICULAR
  Filled 2018-06-09: qty 10

## 2018-06-09 MED ORDER — LIDOCAINE HCL (PF) 1 % IJ SOLN
4.0000 mL | Freq: Once | INTRAMUSCULAR | Status: AC
  Administered 2018-06-09: 5 mL
  Filled 2018-06-09: qty 5

## 2018-06-09 NOTE — Progress Notes (Signed)
Patient's Name: Sabrina Green  MRN: [id number]  Referring Provider: Alanson Aly, FNP  DOB: 20-Nov-1945  PCP: Alanson Aly, FNP  DOS: 06/09/2018  Note by: Gaspar Cola, MD  Service setting: Ambulatory outpatient  Specialty: Interventional Pain Management  Patient type: Established  Location: [facility] (AMB) Pain Management Facility  Visit type: Interventional Procedure   Primary Reason for Visit: Interventional Pain Management Treatment. CC: Knee Pain (bilateral)  Procedure:          Anesthesia, Analgesia, Anxiolysis:  Type: Therapeutic Intra-Articular Hyalgan Knee Injection #2  Region: Lateral infrapatellar Knee Region Level: Knee Joint Laterality: Bilateral  Type: Local Anesthesia Indication(s): Analgesia         Local Anesthetic: Lidocaine 1-2% Route: Infiltration (/IM) IV Access: Declined Sedation: Declined   Position: Sitting   Indications: 1. Osteoarthritis of knees (Bilateral)   2. Tricompartmental disease of knee (Bilateral)   3. Chronic knee pain (Primary Area of Pain) (Bilateral) (L>R)    Pain Score: Pre-procedure: 0-No pain/10 Post-procedure: 0-No pain/10  Pre-op Assessment:  Sabrina Green is a 72 y.o. (year old), female patient, seen today for interventional treatment. She  has a past surgical history that includes Tonsillectomy; Cesarean section; Tubal ligation; Abdominal hysterectomy; Knee arthroscopy; Cholecystectomy; Abdominal hysterectomy; Ankle fracture surgery (Right, 1989); and Eye surgery (Bilateral, 2015). Sabrina Green has a current medication list which includes the following prescription(s): allopurinol, calcium-vitamin d, cholecalciferol, diazepam, fluticasone, furosemide, gabapentin, losartan, lubiprostone, methotrexate, mupirocin ointment, omeprazole, promethazine, propranolol, sertraline, and tizanidine. Her primarily concern today is the Knee Pain (bilateral)  Initial Vital Signs:  Pulse/HCG Rate: 68  Temp: 98.2 F (36.8 C) Resp: 16 BP: (!)  151/71 SpO2: 100 %  BMI: Estimated body mass index is 40.77 kg/m as calculated from the following:   Height as of this encounter: 5\' 5"  (1.651 m).   Weight as of this encounter: 245 lb (111.1 kg).  Risk Assessment: Allergies: Reviewed. She is allergic to penicillins.  Allergy Precautions: None required Coagulopathies: Reviewed. None identified.  Blood-thinner therapy: None at this time Active Infection(s): Reviewed. None identified. Sabrina Green is afebrile  Site Confirmation: Sabrina Green was asked to confirm the procedure and laterality before marking the site Procedure checklist: Completed Consent: Before the procedure and under the influence of no sedative(s), amnesic(s), or anxiolytics, the patient was informed of the treatment options, risks and possible complications. To fulfill our ethical and legal obligations, as recommended by the American Medical Association's Code of Ethics, I have informed the patient of my clinical impression; the nature and purpose of the treatment or procedure; the risks, benefits, and possible complications of the intervention; the alternatives, including doing nothing; the risk(s) and benefit(s) of the alternative treatment(s) or procedure(s); and the risk(s) and benefit(s) of doing nothing. The patient was provided information about the general risks and possible complications associated with the procedure. These may include, but are not limited to: failure to achieve desired goals, infection, bleeding, organ or nerve damage, allergic reactions, paralysis, and death. In addition, the patient was informed of those risks and complications associated to the procedure, such as failure to decrease pain; infection; bleeding; organ or nerve damage with subsequent damage to sensory, motor, and/or autonomic systems, resulting in permanent pain, numbness, and/or weakness of one or several areas of the body; allergic reactions; (i.e.: anaphylactic reaction); and/or  death. Furthermore, the patient was informed of those risks and complications associated with the medications. These include, but are not limited to: allergic reactions (i.e.: anaphylactic or anaphylactoid reaction(s)); adrenal axis  suppression; blood sugar elevation that in diabetics may result in ketoacidosis or comma; water retention that in patients with history of congestive heart failure may result in shortness of breath, pulmonary edema, and decompensation with resultant heart failure; weight gain; swelling or edema; medication-induced neural toxicity; particulate matter embolism and blood vessel occlusion with resultant organ, and/or nervous system infarction; and/or aseptic necrosis of one or more joints. Finally, the patient was informed that Medicine is not an exact science; therefore, there is also the possibility of unforeseen or unpredictable risks and/or possible complications that may result in a catastrophic outcome. The patient indicated having understood very clearly. We have given the patient no guarantees and we have made no promises. Enough time was given to the patient to ask questions, all of which were answered to the patient's satisfaction. Sabrina Green has indicated that she wanted to continue with the procedure. Attestation: I, the ordering provider, attest that I have discussed with the patient the benefits, risks, side-effects, alternatives, likelihood of achieving goals, and potential problems during recovery for the procedure that I have provided informed consent. Date  Time: 06/09/2018  9:52 AM  Pre-Procedure Preparation:  Monitoring: As per clinic protocol. Respiration, ETCO2, SpO2, BP, heart rate and rhythm monitor placed and checked for adequate function Safety Precautions: Patient was assessed for positional comfort and pressure points before starting the procedure. Time-out: I initiated and conducted the "Time-out" before starting the procedure, as per protocol. The  patient was asked to participate by confirming the accuracy of the "Time Out" information. Verification of the correct person, site, and procedure were performed and confirmed by me, the nursing staff, and the patient. "Time-out" conducted as per Joint Commission's Universal Protocol (UP.01.01.01). Time: 1011  Description of Procedure:          Target Area: Knee Joint Approach: Just above the Lateral tibial plateau, lateral to the infrapatellar tendon. Area Prepped: Entire knee area, from the mid-thigh to the mid-shin. Prepping solution: ChloraPrep (2% chlorhexidine gluconate and 70% isopropyl alcohol) Safety Precautions: Aspiration looking for blood return was conducted prior to all injections. At no point did we inject any substances, as a needle was being advanced. No attempts were made at seeking any paresthesias. Safe injection practices and needle disposal techniques used. Medications properly checked for expiration dates. SDV (single dose vial) medications used. Description of the Procedure: Protocol guidelines were followed. The patient was placed in position over the fluoroscopy table. The target area was identified and the area prepped in the usual manner. Skin & deeper tissues infiltrated with local anesthetic. Appropriate amount of time allowed to pass for local anesthetics to take effect. The procedure needles were then advanced to the target area. Proper needle placement secured. Negative aspiration confirmed. Solution injected in intermittent fashion, asking for systemic symptoms every 0.5cc of injectate. The needles were then removed and the area cleansed, making sure to leave some of the prepping solution back to take advantage of its long term bactericidal properties. Vitals:   06/09/18 0951 06/09/18 1016  BP: (!) 151/71 (!) 136/59  Pulse: 68 65  Resp: 16 14  Temp: 98.2 F (36.8 C)   TempSrc: Oral   SpO2: 100% 100%  Weight: 245 lb (111.1 kg)   Height: 5\' 5"  (1.651 m)     Start  Time: 1011 hrs. End Time: 1013 hrs. Materials:  Needle(s) Type: Regular needle Gauge: 25G Length: 1.5-in Medication(s): Please see orders for medications and dosing details.  Imaging Guidance:  Type of Imaging Technique: None used Indication(s): N/A Exposure Time: No patient exposure Contrast: None used. Fluoroscopic Guidance: N/A Ultrasound Guidance: N/A Interpretation: N/A  Antibiotic Prophylaxis:   Anti-infectives (From admission, onward)   None     Indication(s): None identified  Post-operative Assessment:  Post-procedure Vital Signs:  Pulse/HCG Rate: 65  Temp: 98.2 F (36.8 C) Resp: 14 BP: (!) 136/59 SpO2: 100 %  EBL: None  Complications: No immediate post-treatment complications observed by team, or reported by patient.  Note: The patient tolerated the entire procedure well. A repeat set of vitals were taken after the procedure and the patient was kept under observation following institutional policy, for this type of procedure. Post-procedural neurological assessment was performed, showing return to baseline, prior to discharge. The patient was provided with post-procedure discharge instructions, including a section on how to identify potential problems. Should any problems arise concerning this procedure, the patient was given instructions to immediately contact us, at any time, without hesitation. In any case, we plan to contact the patient by telephone for a follow-up status report regarding this interventional procedure.  Comments:  No additional relevant information.  Plan of Care   Imaging Orders  No imaging studies ordered today    Procedure Orders     KNEE INJECTION     KNEE INJECTION  Medications ordered for procedure: Meds ordered this encounter  Medications  . lidocaine (PF) (XYLOCAINE) 1 % injection 4 mL  . ropivacaine (PF) 2 mg/mL (0.2%) (NAROPIN) injection 4 mL  . Sodium Hyaluronate SOSY 2 mL  . Sodium Hyaluronate SOSY 2 mL    Medications administered: We administered lidocaine (PF), ropivacaine (PF) 2 mg/mL (0.2%), Sodium Hyaluronate, and Sodium Hyaluronate.  See the medical record for exact dosing, route, and time of administration.  Disposition: Discharge home  Discharge Date & Time: 06/09/2018; 1020 hrs.   Physician-requested Follow-up: Return for PPE (2 wks) + Procedure (no sedation): (B) Hyalgan #3.  Future Appointments  Date Time Provider [place]  06/30/2018 10:15 AM Milinda Pointer, MD [facility] None   Primary Care Physician: Alanson Aly, [place] Location: [facility] Facility Note by: Gaspar Cola, MD Date: 06/09/2018; Time: 10:45 AM  Disclaimer:  Medicine is not an Chief Strategy Officer. The only guarantee in medicine is that nothing is guaranteed. It is important to note that the decision to proceed with this intervention was based on the information collected from the patient. The Data and conclusions were drawn from the patient's questionnaire, the interview, and the physical examination. Because the information was provided in large part by the patient, it cannot be guaranteed that it has not been purposely or unconsciously manipulated. Every effort has been made to obtain as much relevant data as possible for this evaluation. It is important to note that the conclusions that lead to this procedure are derived in large part from the available data. Always take into account that the treatment will also be dependent on availability of resources and existing treatment guidelines, considered by other Pain Management Practitioners as being common knowledge and practice, at the time of the intervention. For Medico-Legal purposes, it is also important to point out that variation in procedural techniques and pharmacological choices are the acceptable norm. The indications, contraindications, technique, and results of the above procedure should only be interpreted and judged by a  Board-Certified Interventional Pain Specialist with extensive familiarity and expertise in the same exact procedure and technique.

## 2018-06-09 NOTE — Patient Instructions (Signed)
____________________________________________________________________________________________  Post-Procedure Discharge Instructions  Instructions:  Apply ice: Fill a plastic sandwich bag with crushed ice. Cover it with a small towel and apply to injection site. Apply for 15 minutes then remove x 15 minutes. Repeat sequence on day of procedure, until you go to bed. The purpose is to minimize swelling and discomfort after procedure.  Apply heat: Apply heat to procedure site starting the day following the procedure. The purpose is to treat any soreness and discomfort from the procedure.  Food intake: Start with clear liquids (like water) and advance to regular food, as tolerated.   Physical activities: Keep activities to a minimum for the first 8 hours after the procedure.   Driving: If you have received any sedation, you are not allowed to drive for 24 hours after your procedure.  Blood thinner: Restart your blood thinner 6 hours after your procedure. (Only for those taking blood thinners)  Insulin: As soon as you can eat, you may resume your normal dosing schedule. (Only for those taking insulin)  Infection prevention: Keep procedure site clean and dry.  Post-procedure Pain Diary: Extremely important that this be done correctly and accurately. Recorded information will be used to determine the next step in treatment.  Pain evaluated is that of treated area only. Do not include pain from an untreated area.  Complete every hour, on the hour, for the initial 8 hours. Set an alarm to help you do this part accurately.  Do not go to sleep and have it completed later. It will not be accurate.  Follow-up appointment: Keep your follow-up appointment after the procedure. Usually 2 weeks for most procedures. (6 weeks in the case of radiofrequency.) Bring you pain diary.   Expect:  From numbing medicine (AKA: Local Anesthetics): Numbness or decrease in pain.  Onset: Full effect within 15  minutes of injected.  Duration: It will depend on the type of local anesthetic used. On the average, 1 to 8 hours.   From steroids: Decrease in swelling or inflammation. Once inflammation is improved, relief of the pain will follow.  Onset of benefits: Depends on the amount of swelling present. The more swelling, the longer it will take for the benefits to be seen. In some cases, up to 10 days.  Duration: Steroids will stay in the system x 2 weeks. Duration of benefits will depend on multiple posibilities including persistent irritating factors.  Occasional side-effects: Facial flushing (red, warm cheeks) , cramps (if present, drink Gatorade and take over-the-counter Magnesium 450-500 mg once to twice a day).  From procedure: Some discomfort is to be expected once the numbing medicine wears off. This should be minimal if ice and heat are applied as instructed.  Call if:  You experience numbness and weakness that gets worse with time, as opposed to wearing off.  New onset bowel or bladder incontinence. (This applies to Spinal procedures only)  Emergency Numbers:  Durning business hours (Monday - Thursday, 8:00 AM - 4:00 PM) (Friday, 9:00 AM - 12:00 Noon): [phone]  After hours: [phone] ____________________________________________________________________________________________   ____________________________________________________________________________________________  Preparing for your procedure (without sedation)  Instructions: . Oral Intake: Do not eat or drink anything for at least 3 hours prior to your procedure. . Transportation: Unless otherwise stated by your physician, you may drive yourself after the procedure. . Blood Pressure Medicine: Take your blood pressure medicine with a sip of water the morning of the procedure. . Blood thinners: Notify our staff if you are taking any blood  thinners. Depending on which one you take, there will be specific  instructions on how and when to stop it. . Diabetics on insulin: Notify the staff so that you can be scheduled 1st case in the morning. If your diabetes requires high dose insulin, take only  of your normal insulin dose the morning of the procedure and notify the staff that you have done so. . Preventing infections: Shower with an antibacterial soap the morning of your procedure.  . Build-up your immune system: Take 1000 mg of Vitamin C with every meal (3 times a day) the day prior to your procedure. Marland Kitchen Antibiotics: Inform the staff if you have a condition or reason that requires you to take antibiotics before dental procedures. . Pregnancy: If you are pregnant, call and cancel the procedure. . Sickness: If you have a cold, fever, or any active infections, call and cancel the procedure. . Arrival: You must be in the facility at least 30 minutes prior to your scheduled procedure. . Children: Do not bring any children with you. . Dress appropriately: Bring dark clothing that you would not mind if they get stained. . Valuables: Do not bring any jewelry or valuables.  Procedure appointments are reserved for interventional treatments only. Marland Kitchen No Prescription Refills. . No medication changes will be discussed during procedure appointments. . No disability issues will be discussed.  Reasons to call and reschedule or cancel your procedure: (Following these recommendations will minimize the risk of a serious complication.) . Surgeries: Avoid having procedures within 2 weeks of any surgery. (Avoid for 2 weeks before or after any surgery). . Flu Shots: Avoid having procedures within 2 weeks of a flu shots or . (Avoid for 2 weeks before or after immunizations). . Barium: Avoid having a procedure within 7-10 days after having had a radiological study involving the use of radiological contrast. (Myelograms, Barium swallow or enema study). . Heart attacks: Avoid any elective procedures or surgeries for the  initial 6 months after a "Myocardial Infarction" (Heart Attack). . Blood thinners: It is imperative that you stop these medications before procedures. Let us know if you if you take any blood thinner.  . Infection: Avoid procedures during or within two weeks of an infection (including chest colds or gastrointestinal problems). Symptoms associated with infections include: Localized redness, fever, chills, night sweats or profuse sweating, burning sensation when voiding, cough, congestion, stuffiness, runny nose, sore throat, diarrhea, nausea, vomiting, cold or Flu symptoms, recent or current infections. It is specially important if the infection is over the area that we intend to treat. Marland Kitchen Heart and lung problems: Symptoms that may suggest an active cardiopulmonary problem include: cough, chest pain, breathing difficulties or shortness of breath, dizziness, ankle swelling, uncontrolled high or unusually low blood pressure, and/or palpitations. If you are experiencing any of these symptoms, cancel your procedure and contact your primary care physician for an evaluation.  Remember:  Regular Business hours are:  Monday to Thursday 8:00 AM to 4:00 PM  Provider's Schedule: Milinda Pointer, MD:  Procedure days: Tuesday and Thursday 7:30 AM to 4:00 PM  Gillis Santa, MD:  Procedure days: Monday and Wednesday 7:30 AM to 4:00 PM ____________________________________________________________________________________________

## 2018-06-09 NOTE — Progress Notes (Signed)
Safety precautions to be maintained throughout the outpatient stay will include: orient to surroundings, keep bed in low position, maintain call bell within reach at all times, provide assistance with transfer out of bed and ambulation.  

## 2018-06-11 ENCOUNTER — Telehealth: Payer: Self-pay | Admitting: *Deleted

## 2018-06-11 NOTE — Telephone Encounter (Signed)
No problems post procedure. 

## 2018-06-30 ENCOUNTER — Encounter: Payer: Self-pay | Admitting: Pain Medicine

## 2018-06-30 ENCOUNTER — Other Ambulatory Visit: Payer: Self-pay

## 2018-06-30 ENCOUNTER — Ambulatory Visit: Payer: Medicare Other | Attending: Pain Medicine | Admitting: Pain Medicine

## 2018-06-30 VITALS — BP 142/66 | HR 60 | Temp 98.1°F | Resp 16 | Ht 65.0 in | Wt 240.0 lb

## 2018-06-30 DIAGNOSIS — Z79899 Other long term (current) drug therapy: Secondary | ICD-10-CM | POA: Diagnosis not present

## 2018-06-30 DIAGNOSIS — M25561 Pain in right knee: Secondary | ICD-10-CM | POA: Insufficient documentation

## 2018-06-30 DIAGNOSIS — Z880 Allergy status to penicillin: Secondary | ICD-10-CM | POA: Diagnosis not present

## 2018-06-30 DIAGNOSIS — M25562 Pain in left knee: Secondary | ICD-10-CM | POA: Insufficient documentation

## 2018-06-30 DIAGNOSIS — M170 Bilateral primary osteoarthritis of knee: Secondary | ICD-10-CM | POA: Insufficient documentation

## 2018-06-30 DIAGNOSIS — G8929 Other chronic pain: Secondary | ICD-10-CM

## 2018-06-30 DIAGNOSIS — Z9049 Acquired absence of other specified parts of digestive tract: Secondary | ICD-10-CM | POA: Diagnosis not present

## 2018-06-30 MED ORDER — LIDOCAINE HCL (PF) 1 % IJ SOLN
4.0000 mL | Freq: Once | INTRAMUSCULAR | Status: AC
  Administered 2018-06-30: 5 mL

## 2018-06-30 MED ORDER — SODIUM HYALURONATE (VISCOSUP) 20 MG/2ML IX SOSY
2.0000 mL | PREFILLED_SYRINGE | Freq: Once | INTRA_ARTICULAR | Status: AC
  Administered 2018-06-30: 2 mL via INTRA_ARTICULAR

## 2018-06-30 MED ORDER — LIDOCAINE HCL (PF) 1 % IJ SOLN
INTRAMUSCULAR | Status: AC
  Filled 2018-06-30: qty 5

## 2018-06-30 MED ORDER — ROPIVACAINE HCL 2 MG/ML IJ SOLN
INTRAMUSCULAR | Status: AC
  Filled 2018-06-30: qty 10

## 2018-06-30 MED ORDER — LIDOCAINE HCL 2 % IJ SOLN
INTRAMUSCULAR | Status: AC
  Filled 2018-06-30: qty 20

## 2018-06-30 MED ORDER — ROPIVACAINE HCL 2 MG/ML IJ SOLN
4.0000 mL | Freq: Once | INTRAMUSCULAR | Status: AC
  Administered 2018-06-30: 10 mL via INTRA_ARTICULAR

## 2018-06-30 NOTE — Progress Notes (Signed)
Safety precautions to be maintained throughout the outpatient stay will include: orient to surroundings, keep bed in low position, maintain call bell within reach at all times, provide assistance with transfer out of bed and ambulation.  

## 2018-06-30 NOTE — Patient Instructions (Signed)
____________________________________________________________________________________________  Post-Procedure Discharge Instructions  Instructions:  Apply ice: Fill a plastic sandwich bag with crushed ice. Cover it with a small towel and apply to injection site. Apply for 15 minutes then remove x 15 minutes. Repeat sequence on day of procedure, until you go to bed. The purpose is to minimize swelling and discomfort after procedure.  Apply heat: Apply heat to procedure site starting the day following the procedure. The purpose is to treat any soreness and discomfort from the procedure.  Food intake: Start with clear liquids (like water) and advance to regular food, as tolerated.   Physical activities: Keep activities to a minimum for the first 8 hours after the procedure.   Driving: If you have received any sedation, you are not allowed to drive for 24 hours after your procedure.  Blood thinner: Restart your blood thinner 6 hours after your procedure. (Only for those taking blood thinners)  Insulin: As soon as you can eat, you may resume your normal dosing schedule. (Only for those taking insulin)  Infection prevention: Keep procedure site clean and dry.  Post-procedure Pain Diary: Extremely important that this be done correctly and accurately. Recorded information will be used to determine the next step in treatment.  Pain evaluated is that of treated area only. Do not include pain from an untreated area.  Complete every hour, on the hour, for the initial 8 hours. Set an alarm to help you do this part accurately.  Do not go to sleep and have it completed later. It will not be accurate.  Follow-up appointment: Keep your follow-up appointment after the procedure. Usually 2 weeks for most procedures. (6 weeks in the case of radiofrequency.) Bring you pain diary.   Expect:  From numbing medicine (AKA: Local Anesthetics): Numbness or decrease in pain.  Onset: Full effect within 15  minutes of injected.  Duration: It will depend on the type of local anesthetic used. On the average, 1 to 8 hours.   From steroids: Decrease in swelling or inflammation. Once inflammation is improved, relief of the pain will follow.  Onset of benefits: Depends on the amount of swelling present. The more swelling, the longer it will take for the benefits to be seen. In some cases, up to 10 days.  Duration: Steroids will stay in the system x 2 weeks. Duration of benefits will depend on multiple posibilities including persistent irritating factors.  Occasional side-effects: Facial flushing (red, warm cheeks) , cramps (if present, drink Gatorade and take over-the-counter Magnesium 450-500 mg once to twice a day).  From procedure: Some discomfort is to be expected once the numbing medicine wears off. This should be minimal if ice and heat are applied as instructed.  Call if:  You experience numbness and weakness that gets worse with time, as opposed to wearing off.  New onset bowel or bladder incontinence. (This applies to Spinal procedures only)  Emergency Numbers:  Durning business hours (Monday - Thursday, 8:00 AM - 4:00 PM) (Friday, 9:00 AM - 12:00 Noon): [phone]  After hours: [phone] ____________________________________________________________________________________________   ____________________________________________________________________________________________  Preparing for your procedure (without sedation)  Instructions: . Oral Intake: Do not eat or drink anything for at least 3 hours prior to your procedure. . Transportation: Unless otherwise stated by your physician, you may drive yourself after the procedure. . Blood Pressure Medicine: Take your blood pressure medicine with a sip of water the morning of the procedure. . Blood thinners: Notify our staff if you are taking any blood  thinners. Depending on which one you take, there will be specific  instructions on how and when to stop it. . Diabetics on insulin: Notify the staff so that you can be scheduled 1st case in the morning. If your diabetes requires high dose insulin, take only  of your normal insulin dose the morning of the procedure and notify the staff that you have done so. . Preventing infections: Shower with an antibacterial soap the morning of your procedure.  . Build-up your immune system: Take 1000 mg of Vitamin C with every meal (3 times a day) the day prior to your procedure. Marland Kitchen Antibiotics: Inform the staff if you have a condition or reason that requires you to take antibiotics before dental procedures. . Pregnancy: If you are pregnant, call and cancel the procedure. . Sickness: If you have a cold, fever, or any active infections, call and cancel the procedure. . Arrival: You must be in the facility at least 30 minutes prior to your scheduled procedure. . Children: Do not bring any children with you. . Dress appropriately: Bring dark clothing that you would not mind if they get stained. . Valuables: Do not bring any jewelry or valuables.  Procedure appointments are reserved for interventional treatments only. Marland Kitchen No Prescription Refills. . No medication changes will be discussed during procedure appointments. . No disability issues will be discussed.  Reasons to call and reschedule or cancel your procedure: (Following these recommendations will minimize the risk of a serious complication.) . Surgeries: Avoid having procedures within 2 weeks of any surgery. (Avoid for 2 weeks before or after any surgery). . Flu Shots: Avoid having procedures within 2 weeks of a flu shots or . (Avoid for 2 weeks before or after immunizations). . Barium: Avoid having a procedure within 7-10 days after having had a radiological study involving the use of radiological contrast. (Myelograms, Barium swallow or enema study). . Heart attacks: Avoid any elective procedures or surgeries for the  initial 6 months after a "Myocardial Infarction" (Heart Attack). . Blood thinners: It is imperative that you stop these medications before procedures. Let us know if you if you take any blood thinner.  . Infection: Avoid procedures during or within two weeks of an infection (including chest colds or gastrointestinal problems). Symptoms associated with infections include: Localized redness, fever, chills, night sweats or profuse sweating, burning sensation when voiding, cough, congestion, stuffiness, runny nose, sore throat, diarrhea, nausea, vomiting, cold or Flu symptoms, recent or current infections. It is specially important if the infection is over the area that we intend to treat. Marland Kitchen Heart and lung problems: Symptoms that may suggest an active cardiopulmonary problem include: cough, chest pain, breathing difficulties or shortness of breath, dizziness, ankle swelling, uncontrolled high or unusually low blood pressure, and/or palpitations. If you are experiencing any of these symptoms, cancel your procedure and contact your primary care physician for an evaluation.  Remember:  Regular Business hours are:  Monday to Thursday 8:00 AM to 4:00 PM  Provider's Schedule: Milinda Pointer, MD:  Procedure days: Tuesday and Thursday 7:30 AM to 4:00 PM  Gillis Santa, MD:  Procedure days: Monday and Wednesday 7:30 AM to 4:00 PM ____________________________________________________________________________________________

## 2018-06-30 NOTE — Progress Notes (Signed)
Patient's Name: Sabrina Green  MRN: [id number]  Referring Provider: Alanson Aly, FNP  DOB: [birth year]  PCP: Alanson Aly, FNP  DOS: 06/30/2018  Note by: Gaspar Cola, MD  Service setting: Ambulatory outpatient  Specialty: Interventional Pain Management  Patient type: Established  Location: [facility] (AMB) Pain Management Facility  Visit type: Interventional Procedure   Primary Reason for Visit: Interventional Pain Management Treatment. CC: Procedure (hyalgan #3) and Knee Pain  Procedure:          Anesthesia, Analgesia, Anxiolysis:  Type: Therapeutic Intra-Articular Hyalgan Knee Injection #3  Region: Lateral infrapatellar Knee Region Level: Knee Joint Laterality: Bilateral  Type: Local Anesthesia Indication(s): Analgesia         Local Anesthetic: Lidocaine 1-2% Route: Infiltration (Dixie/IM) IV Access: Declined Sedation: Declined   Position: Sitting   Indications: 1. Osteoarthritis of knees (Bilateral)   2. Chronic knee pain (Primary Area of Pain) (Bilateral) (L>R)   3. Tricompartmental disease of knee (Bilateral)    Pain Score: Pre-procedure: 1 /10 Post-procedure: 0-No pain/10  Pre-op Assessment:  Sabrina Green is a 72 y.o. (year old), female patient, seen today for interventional treatment. She  has a past surgical history that includes Tonsillectomy; Cesarean section; Tubal ligation; Abdominal hysterectomy; Knee arthroscopy; Cholecystectomy; Abdominal hysterectomy; Ankle fracture surgery (Right, 1989); and Eye surgery (Bilateral, 2015). Sabrina Green has a current medication list which includes the following prescription(s): allopurinol, calcium-vitamin d, cholecalciferol, diazepam, fluticasone, furosemide, gabapentin, losartan, lubiprostone, methotrexate, methylprednisolone, mupirocin ointment, omeprazole, promethazine, propranolol, sertraline, and tizanidine. Her primarily concern today is the Procedure (hyalgan #3) and Knee Pain  Initial Vital Signs:  Pulse/HCG Rate: 63   Temp: 98.1 F (36.7 C) Resp: 18 BP: 128/89 SpO2: 99 %  BMI: Estimated body mass index is 39.94 kg/m as calculated from the following:   Height as of this encounter: 5\' 5"  (1.651 m).   Weight as of this encounter: 240 lb (108.9 kg).  Risk Assessment: Allergies: Reviewed. She is allergic to penicillins.  Allergy Precautions: None required Coagulopathies: Reviewed. None identified.  Blood-thinner therapy: None at this time Active Infection(s): Reviewed. None identified. Sabrina Green is afebrile  Site Confirmation: Sabrina Green was asked to confirm the procedure and laterality before marking the site Procedure checklist: Completed Consent: Before the procedure and under the influence of no sedative(s), amnesic(s), or anxiolytics, the patient was informed of the treatment options, risks and possible complications. To fulfill our ethical and legal obligations, as recommended by the American Medical Association's Code of Ethics, I have informed the patient of my clinical impression; the nature and purpose of the treatment or procedure; the risks, benefits, and possible complications of the intervention; the alternatives, including doing nothing; the risk(s) and benefit(s) of the alternative treatment(s) or procedure(s); and the risk(s) and benefit(s) of doing nothing. The patient was provided information about the general risks and possible complications associated with the procedure. These may include, but are not limited to: failure to achieve desired goals, infection, bleeding, organ or nerve damage, allergic reactions, paralysis, and death. In addition, the patient was informed of those risks and complications associated to the procedure, such as failure to decrease pain; infection; bleeding; organ or nerve damage with subsequent damage to sensory, motor, and/or autonomic systems, resulting in permanent pain, numbness, and/or weakness of one or several areas of the body; allergic reactions; (i.e.:  anaphylactic reaction); and/or death. Furthermore, the patient was informed of those risks and complications associated with the medications. These include, but are not limited to: allergic reactions (i.e.:  anaphylactic or anaphylactoid reaction(s)); adrenal axis suppression; blood sugar elevation that in diabetics may result in ketoacidosis or comma; water retention that in patients with history of congestive heart failure may result in shortness of breath, pulmonary edema, and decompensation with resultant heart failure; weight gain; swelling or edema; medication-induced neural toxicity; particulate matter embolism and blood vessel occlusion with resultant organ, and/or nervous system infarction; and/or aseptic necrosis of one or more joints. Finally, the patient was informed that Medicine is not an exact science; therefore, there is also the possibility of unforeseen or unpredictable risks and/or possible complications that may result in a catastrophic outcome. The patient indicated having understood very clearly. We have given the patient no guarantees and we have made no promises. Enough time was given to the patient to ask questions, all of which were answered to the patient's satisfaction. Sabrina Green has indicated that she wanted to continue with the procedure. Attestation: I, the ordering provider, attest that I have discussed with the patient the benefits, risks, side-effects, alternatives, likelihood of achieving goals, and potential problems during recovery for the procedure that I have provided informed consent. Date  Time: 06/30/2018 10:07 AM  Pre-Procedure Preparation:  Monitoring: As per clinic protocol. Respiration, ETCO2, SpO2, BP, heart rate and rhythm monitor placed and checked for adequate function Safety Precautions: Patient was assessed for positional comfort and pressure points before starting the procedure. Time-out: I initiated and conducted the "Time-out" before starting the  procedure, as per protocol. The patient was asked to participate by confirming the accuracy of the "Time Out" information. Verification of the correct person, site, and procedure were performed and confirmed by me, the nursing staff, and the patient. "Time-out" conducted as per Joint Commission's Universal Protocol (UP.01.01.01). Time: 1030  Description of Procedure:          Target Area: Knee Joint Approach: Just above the Lateral tibial plateau, lateral to the infrapatellar tendon. Area Prepped: Entire knee area, from the mid-thigh to the mid-shin. Prepping solution: ChloraPrep (2% chlorhexidine gluconate and 70% isopropyl alcohol) Safety Precautions: Aspiration looking for blood return was conducted prior to all injections. At no point did we inject any substances, as a needle was being advanced. No attempts were made at seeking any paresthesias. Safe injection practices and needle disposal techniques used. Medications properly checked for expiration dates. SDV (single dose vial) medications used. Description of the Procedure: Protocol guidelines were followed. The patient was placed in position over the fluoroscopy table. The target area was identified and the area prepped in the usual manner. Skin & deeper tissues infiltrated with local anesthetic. Appropriate amount of time allowed to pass for local anesthetics to take effect. The procedure needles were then advanced to the target area. Proper needle placement secured. Negative aspiration confirmed. Solution injected in intermittent fashion, asking for systemic symptoms every 0.5cc of injectate. The needles were then removed and the area cleansed, making sure to leave some of the prepping solution back to take advantage of its long term bactericidal properties. Vitals:   06/30/18 1005 06/30/18 1035  BP: 128/89 (!) 142/66  Pulse: 63 60  Resp: 18 16  Temp: 98.1 F (36.7 C)   SpO2: 99% 100%  Weight: 240 lb (108.9 kg)   Height: 5\' 5"  (1.651 m)      Start Time: 1030 hrs. End Time: 1033 hrs. Materials:  Needle(s) Type: Regular needle Gauge: 25G Length: 1.5-in Medication(s): Please see orders for medications and dosing details.  Imaging Guidance:  Type of Imaging Technique: None used Indication(s): N/A Exposure Time: No patient exposure Contrast: None used. Fluoroscopic Guidance: N/A Ultrasound Guidance: N/A Interpretation: N/A  Antibiotic Prophylaxis:   Anti-infectives (From admission, onward)   None     Indication(s): None identified  Post-operative Assessment:  Post-procedure Vital Signs:  Pulse/HCG Rate: 60  Temp: 98.1 F (36.7 C) Resp: 16 BP: (!) 142/66 SpO2: 100 %  EBL: None  Complications: No immediate post-treatment complications observed by team, or reported by patient.  Note: The patient tolerated the entire procedure well. A repeat set of vitals were taken after the procedure and the patient was kept under observation following institutional policy, for this type of procedure. Post-procedural neurological assessment was performed, showing return to baseline, prior to discharge. The patient was provided with post-procedure discharge instructions, including a section on how to identify potential problems. Should any problems arise concerning this procedure, the patient was given instructions to immediately contact us, at any time, without hesitation. In any case, we plan to contact the patient by telephone for a follow-up status report regarding this interventional procedure.  Comments:  No additional relevant information.  Plan of Care   Imaging Orders  No imaging studies ordered today    Procedure Orders     KNEE INJECTION     KNEE INJECTION  Medications ordered for procedure: Meds ordered this encounter  Medications  . lidocaine (PF) (XYLOCAINE) 1 % injection 4 mL  . ropivacaine (PF) 2 mg/mL (0.2%) (NAROPIN) injection 4 mL  . Sodium Hyaluronate SOSY 2 mL  . Sodium Hyaluronate SOSY  2 mL   Medications administered: We administered lidocaine (PF), ropivacaine (PF) 2 mg/mL (0.2%), Sodium Hyaluronate, and Sodium Hyaluronate.  See the medical record for exact dosing, route, and time of administration.  Disposition: Discharge home  Discharge Date & Time: 06/30/2018; 1030 hrs.   Physician-requested Follow-up: Return for PPE (2 wks) + Procedure (no sedation): (B) Hyalgan #3.  Future Appointments  Date Time Provider [place]  07/14/2018 10:15 AM Milinda Pointer, MD [facility] None   Primary Care Physician: Alanson Aly, FNP Location: [facility] Note by: Gaspar Cola, MD Date: 06/30/2018; Time: 12:33 PM  Disclaimer:  Medicine is not an exact science. The only guarantee in medicine is that nothing is guaranteed. It is important to note that the decision to proceed with this intervention was based on the information collected from the patient. The Data and conclusions were drawn from the patient's questionnaire, the interview, and the physical examination. Because the information was provided in large part by the patient, it cannot be guaranteed that it has not been purposely or unconsciously manipulated. Every effort has been made to obtain as much relevant data as possible for this evaluation. It is important to note that the conclusions that lead to this procedure are derived in large part from the available data. Always take into account that the treatment will also be dependent on availability of resources and existing treatment guidelines, considered by other Pain Management Practitioners as being common knowledge and practice, at the time of the intervention. For Medico-Legal purposes, it is also important to point out that variation in procedural techniques and pharmacological choices are the acceptable norm. The indications, contraindications, technique, and results of the above procedure should only be interpreted and judged  by a Board-Certified Interventional Pain Specialist with extensive familiarity and expertise in the same exact procedure and technique.

## 2018-07-01 ENCOUNTER — Telehealth: Payer: Self-pay | Admitting: *Deleted

## 2018-07-01 NOTE — Telephone Encounter (Signed)
Left voicemail for patient to call if there are any questions or concerns re; procedure on yesterday.

## 2018-07-14 ENCOUNTER — Encounter: Payer: Self-pay | Admitting: Pain Medicine

## 2018-07-14 ENCOUNTER — Ambulatory Visit: Payer: Medicare Other | Attending: Pain Medicine | Admitting: Pain Medicine

## 2018-07-14 VITALS — BP 141/67 | HR 71 | Temp 98.1°F | Resp 16 | Ht 60.0 in | Wt 245.0 lb

## 2018-07-14 DIAGNOSIS — G8929 Other chronic pain: Secondary | ICD-10-CM | POA: Insufficient documentation

## 2018-07-14 DIAGNOSIS — M25562 Pain in left knee: Secondary | ICD-10-CM

## 2018-07-14 DIAGNOSIS — M25561 Pain in right knee: Secondary | ICD-10-CM

## 2018-07-14 DIAGNOSIS — M170 Bilateral primary osteoarthritis of knee: Secondary | ICD-10-CM | POA: Diagnosis not present

## 2018-07-14 MED ORDER — ROPIVACAINE HCL 2 MG/ML IJ SOLN
4.0000 mL | Freq: Once | INTRAMUSCULAR | Status: AC
  Administered 2018-07-14: 10 mL via INTRA_ARTICULAR

## 2018-07-14 MED ORDER — ROPIVACAINE HCL 2 MG/ML IJ SOLN
INTRAMUSCULAR | Status: AC
  Filled 2018-07-14: qty 10

## 2018-07-14 MED ORDER — LIDOCAINE HCL (PF) 1 % IJ SOLN
4.0000 mL | Freq: Once | INTRAMUSCULAR | Status: AC
  Administered 2018-07-14: 5 mL

## 2018-07-14 MED ORDER — SODIUM HYALURONATE (VISCOSUP) 20 MG/2ML IX SOSY
2.0000 mL | PREFILLED_SYRINGE | Freq: Once | INTRA_ARTICULAR | Status: AC
  Administered 2018-07-14: 2 mL via INTRA_ARTICULAR

## 2018-07-14 MED ORDER — LIDOCAINE HCL (PF) 1 % IJ SOLN
INTRAMUSCULAR | Status: AC
  Filled 2018-07-14: qty 5

## 2018-07-14 NOTE — Progress Notes (Signed)
Safety precautions to be maintained throughout the outpatient stay will include: orient to surroundings, keep bed in low position, maintain call bell within reach at all times, provide assistance with transfer out of bed and ambulation.  

## 2018-07-14 NOTE — Progress Notes (Signed)
Patient's Name: Sabrina Green  MRN: [id number]  Referring Provider: Alanson Aly, FNP  DOB: April 06, 1946  PCP: Alanson Aly, FNP  DOS: 07/14/2018  Note by: Gaspar Cola, MD  Service setting: Ambulatory outpatient  Specialty: Interventional Pain Management  Patient type: Established  Location: [facility] (AMB) Pain Management Facility  Visit type: Interventional Procedure   Primary Reason for Visit: Interventional Pain Management Treatment. CC: Knee Pain (right)  Procedure:          Anesthesia, Analgesia, Anxiolysis:  Type: Therapeutic Intra-Articular Hyalgan Knee Injection #4  Region: Lateral infrapatellar Knee Region Level: Knee Joint Laterality: Bilateral  Type: Local Anesthesia Indication(s): Analgesia         Local Anesthetic: Lidocaine 1-2% Route: Infiltration ([place]/IM) IV Access: Declined Sedation: Declined   Position: Sitting   Indications: 1. Osteoarthritis of knees (Bilateral)   2. Chronic knee pain (Primary Area of Pain) (Bilateral) (L>R)    Pain Score: Pre-procedure: 3 /10 Post-procedure: 3 /10  Pre-op Assessment:  Sabrina Green is a 72 y.o. (year old), female patient, seen today for interventional treatment. She  has a past surgical history that includes Tonsillectomy; Cesarean section; Tubal ligation; Abdominal hysterectomy; Knee arthroscopy; Cholecystectomy; Abdominal hysterectomy; Ankle fracture surgery (Right, 1989); and Eye surgery (Bilateral, 2015). [name] has a current medication list which includes the following prescription(s): allopurinol, calcium-vitamin d, cholecalciferol, diazepam, fluticasone, furosemide, gabapentin, losartan, lubiprostone, methotrexate, methylprednisolone, mupirocin ointment, omeprazole, promethazine, propranolol, sertraline, and tizanidine. Her primarily concern today is the Knee Pain (right)  Initial Vital Signs:  Pulse/HCG Rate: 71  Temp: 98.1 F (36.7 C) Resp: 16 BP: (!) 141/67 SpO2: 99 %  BMI: Estimated body mass index  is 47.85 kg/m as calculated from the following:   Height as of this encounter: 5' (1.524 m).   Weight as of this encounter: 245 lb (111.1 kg).  Risk Assessment: Allergies: Reviewed. She is allergic to penicillins.  Allergy Precautions: None required Coagulopathies: Reviewed. None identified.  Blood-thinner therapy: None at this time Active Infection(s): Reviewed. None identified. Sabrina Green is afebrile  Site Confirmation: Sabrina Green was asked to confirm the procedure and laterality before marking the site Procedure checklist: Completed Consent: Before the procedure and under the influence of no sedative(s), amnesic(s), or anxiolytics, the patient was informed of the treatment options, risks and possible complications. To fulfill our ethical and legal obligations, as recommended by the American Medical Association's Code of Ethics, I have informed the patient of my clinical impression; the nature and purpose of the treatment or procedure; the risks, benefits, and possible complications of the intervention; the alternatives, including doing nothing; the risk(s) and benefit(s) of the alternative treatment(s) or procedure(s); and the risk(s) and benefit(s) of doing nothing. The patient was provided information about the general risks and possible complications associated with the procedure. These may include, but are not limited to: failure to achieve desired goals, infection, bleeding, organ or nerve damage, allergic reactions, paralysis, and death. In addition, the patient was informed of those risks and complications associated to the procedure, such as failure to decrease pain; infection; bleeding; organ or nerve damage with subsequent damage to sensory, motor, and/or autonomic systems, resulting in permanent pain, numbness, and/or weakness of one or several areas of the body; allergic reactions; (i.e.: anaphylactic reaction); and/or death. Furthermore, the patient was informed of those risks and  complications associated with the medications. These include, but are not limited to: allergic reactions (i.e.: anaphylactic or anaphylactoid reaction(s)); adrenal axis suppression; blood sugar elevation that in diabetics may  result in ketoacidosis or comma; water retention that in patients with history of congestive heart failure may result in shortness of breath, pulmonary edema, and decompensation with resultant heart failure; weight gain; swelling or edema; medication-induced neural toxicity; particulate matter embolism and blood vessel occlusion with resultant organ, and/or nervous system infarction; and/or aseptic necrosis of one or more joints. Finally, the patient was informed that Medicine is not an exact science; therefore, there is also the possibility of unforeseen or unpredictable risks and/or possible complications that may result in a catastrophic outcome. The patient indicated having understood very clearly. We have given the patient no guarantees and we have made no promises. Enough time was given to the patient to ask questions, all of which were answered to the patient's satisfaction. Sabrina Green has indicated that she wanted to continue with the procedure. Attestation: I, the ordering provider, attest that I have discussed with the patient the benefits, risks, side-effects, alternatives, likelihood of achieving goals, and potential problems during recovery for the procedure that I have provided informed consent. Date  Time: 07/14/2018  9:59 AM  Pre-Procedure Preparation:  Monitoring: As per clinic protocol. Respiration, ETCO2, SpO2, BP, heart rate and rhythm monitor placed and checked for adequate function Safety Precautions: Patient was assessed for positional comfort and pressure points before starting the procedure. Time-out: I initiated and conducted the "Time-out" before starting the procedure, as per protocol. The patient was asked to participate by confirming the accuracy of the  "Time Out" information. Verification of the correct person, site, and procedure were performed and confirmed by me, the nursing staff, and the patient. "Time-out" conducted as per Joint Commission's Universal Protocol (UP.01.01.01). Time: 1027  Description of Procedure:          Target Area: Knee Joint Approach: Just above the Lateral tibial plateau, lateral to the infrapatellar tendon. Area Prepped: Entire knee area, from the mid-thigh to the mid-shin. Prepping solution: ChloraPrep (2% chlorhexidine gluconate and 70% isopropyl alcohol) Safety Precautions: Aspiration looking for blood return was conducted prior to all injections. At no point did we inject any substances, as a needle was being advanced. No attempts were made at seeking any paresthesias. Safe injection practices and needle disposal techniques used. Medications properly checked for expiration dates. SDV (single dose vial) medications used. Description of the Procedure: Protocol guidelines were followed. The patient was placed in position over the fluoroscopy table. The target area was identified and the area prepped in the usual manner. Skin & deeper tissues infiltrated with local anesthetic. Appropriate amount of time allowed to pass for local anesthetics to take effect. The procedure needles were then advanced to the target area. Proper needle placement secured. Negative aspiration confirmed. Solution injected in intermittent fashion, asking for systemic symptoms every 0.5cc of injectate. The needles were then removed and the area cleansed, making sure to leave some of the prepping solution back to take advantage of its long term bactericidal properties. Vitals:   07/14/18 0958  BP: (!) 141/67  Pulse: 71  Resp: 16  Temp: 98.1 F (36.7 C)  TempSrc: Oral  SpO2: 99%  Weight: 245 lb (111.1 kg)  Height: 5' (1.524 m)  HC: 5" (12.7 cm)    Start Time: 1027 hrs. End Time: 1029 hrs. Materials:  Needle(s) Type: Regular needle Gauge:  25G Length: 1.5-in Medication(s): Please see orders for medications and dosing details.  Imaging Guidance:          Type of Imaging Technique: None used Indication(s): N/A Exposure Time: No patient  exposure Contrast: None used. Fluoroscopic Guidance: N/A Ultrasound Guidance: N/A Interpretation: N/A  Antibiotic Prophylaxis:   Anti-infectives (From admission, onward)   None     Indication(s): None identified  Post-operative Assessment:  Post-procedure Vital Signs:  Pulse/HCG Rate: 71  Temp: 98.1 F (36.7 C) Resp: 16 BP: (!) 141/67 SpO2: 99 %  EBL: None  Complications: No immediate post-treatment complications observed by team, or reported by patient.  Note: The patient tolerated the entire procedure well. A repeat set of vitals were taken after the procedure and the patient was kept under observation following institutional policy, for this type of procedure. Post-procedural neurological assessment was performed, showing return to baseline, prior to discharge. The patient was provided with post-procedure discharge instructions, including a section on how to identify potential problems. Should any problems arise concerning this procedure, the patient was given instructions to immediately contact us, at any time, without hesitation. In any case, we plan to contact the patient by telephone for a follow-up status report regarding this interventional procedure.  Comments:  No additional relevant information.  Plan of Care   Imaging Orders  No imaging studies ordered today    Procedure Orders     KNEE INJECTION     KNEE INJECTION  Medications ordered for procedure: Meds ordered this encounter  Medications  . lidocaine (PF) (XYLOCAINE) 1 % injection 4 mL  . ropivacaine (PF) 2 mg/mL (0.2%) (NAROPIN) injection 4 mL  . Sodium Hyaluronate SOSY 2 mL  . Sodium Hyaluronate SOSY 2 mL   Medications administered: We administered lidocaine (PF), ropivacaine (PF) 2 mg/mL (0.2%),  Sodium Hyaluronate, and Sodium Hyaluronate.  See the medical record for exact dosing, route, and time of administration.  Disposition: Discharge home  Discharge Date & Time: 07/14/2018; 1035 hrs.   Physician-requested Follow-up: Return in about 2 weeks (around 07/28/2018) for Procedure (no sedation): (B) Hyalgan #5.  Future Appointments  Date Time Provider [place]  08/06/2018  1:45 PM Milinda Pointer, MD [facility] None   Primary Care Physician: Alanson Aly, FNP Location: [facility] Note by: Gaspar Cola, MD Date: 07/14/2018; Time: 10:36 AM  Disclaimer:  Medicine is not an exact science. The only guarantee in medicine is that nothing is guaranteed. It is important to note that the decision to proceed with this intervention was based on the information collected from the patient. The Data and conclusions were drawn from the patient's questionnaire, the interview, and the physical examination. Because the information was provided in large part by the patient, it cannot be guaranteed that it has not been purposely or unconsciously manipulated. Every effort has been made to obtain as much relevant data as possible for this evaluation. It is important to note that the conclusions that lead to this procedure are derived in large part from the available data. Always take into account that the treatment will also be dependent on availability of resources and existing treatment guidelines, considered by other Pain Management Practitioners as being common knowledge and practice, at the time of the intervention. For Medico-Legal purposes, it is also important to point out that variation in procedural techniques and pharmacological choices are the acceptable norm. The indications, contraindications, technique, and results of the above procedure should only be interpreted and judged by a Board-Certified Interventional Pain Specialist with extensive familiarity  and expertise in the same exact procedure and technique.

## 2018-07-14 NOTE — Patient Instructions (Addendum)
____________________________________________________________________________________________  Post-Procedure Discharge Instructions  Instructions:  Apply ice: Fill a plastic sandwich bag with crushed ice. Cover it with a small towel and apply to injection site. Apply for 15 minutes then remove x 15 minutes. Repeat sequence on day of procedure, until you go to bed. The purpose is to minimize swelling and discomfort after procedure.  Apply heat: Apply heat to procedure site starting the day following the procedure. The purpose is to treat any soreness and discomfort from the procedure.  Food intake: Start with clear liquids (like water) and advance to regular food, as tolerated.   Physical activities: Keep activities to a minimum for the first 8 hours after the procedure.   Driving: If you have received any sedation, you are not allowed to drive for 24 hours after your procedure.  Blood thinner: Restart your blood thinner 6 hours after your procedure. (Only for those taking blood thinners)  Insulin: As soon as you can eat, you may resume your normal dosing schedule. (Only for those taking insulin)  Infection prevention: Keep procedure site clean and dry.  Post-procedure Pain Diary: Extremely important that this be done correctly and accurately. Recorded information will be used to determine the next step in treatment.  Pain evaluated is that of treated area only. Do not include pain from an untreated area.  Complete every hour, on the hour, for the initial 8 hours. Set an alarm to help you do this part accurately.  Do not go to sleep and have it completed later. It will not be accurate.  Follow-up appointment: Keep your follow-up appointment after the procedure. Usually 2 weeks for most procedures. (6 weeks in the case of radiofrequency.) Bring you pain diary.   Expect:  From numbing medicine (AKA: Local Anesthetics): Numbness or decrease in pain.  Onset: Full effect within 15  minutes of injected.  Duration: It will depend on the type of local anesthetic used. On the average, 1 to 8 hours.   From steroids: Decrease in swelling or inflammation. Once inflammation is improved, relief of the pain will follow.  Onset of benefits: Depends on the amount of swelling present. The more swelling, the longer it will take for the benefits to be seen. In some cases, up to 10 days.  Duration: Steroids will stay in the system x 2 weeks. Duration of benefits will depend on multiple posibilities including persistent irritating factors.  Occasional side-effects: Facial flushing (red, warm cheeks) , cramps (if present, drink Gatorade and take over-the-counter Magnesium 450-500 mg once to twice a day).  From procedure: Some discomfort is to be expected once the numbing medicine wears off. This should be minimal if ice and heat are applied as instructed.  Call if:  You experience numbness and weakness that gets worse with time, as opposed to wearing off.  New onset bowel or bladder incontinence. (This applies to Spinal procedures only)  Emergency Numbers:  Durning business hours (Monday - Thursday, 8:00 AM - 4:00 PM) (Friday, 9:00 AM - 12:00 Noon): [phone]  After hours: [phone] ____________________________________________________________________________________________   ____________________________________________________________________________________________  Preparing for your procedure (without sedation)  Instructions: . Oral Intake: Do not eat or drink anything for at least 3 hours prior to your procedure. . Transportation: Unless otherwise stated by your physician, you may drive yourself after the procedure. . Blood Pressure Medicine: Take your blood pressure medicine with a sip of water the morning of the procedure. . Blood thinners: Notify our staff if you are taking any blood  thinners. Depending on which one you take, there will be specific  instructions on how and when to stop it. . Diabetics on insulin: Notify the staff so that you can be scheduled 1st case in the morning. If your diabetes requires high dose insulin, take only  of your normal insulin dose the morning of the procedure and notify the staff that you have done so. . Preventing infections: Shower with an antibacterial soap the morning of your procedure.  . Build-up your immune system: Take 1000 mg of Vitamin C with every meal (3 times a day) the day prior to your procedure. Marland Kitchen Antibiotics: Inform the staff if you have a condition or reason that requires you to take antibiotics before dental procedures. . Pregnancy: If you are pregnant, call and cancel the procedure. . Sickness: If you have a cold, fever, or any active infections, call and cancel the procedure. . Arrival: You must be in the facility at least 30 minutes prior to your scheduled procedure. . Children: Do not bring any children with you. . Dress appropriately: Bring dark clothing that you would not mind if they get stained. . Valuables: Do not bring any jewelry or valuables.  Procedure appointments are reserved for interventional treatments only. Marland Kitchen No Prescription Refills. . No medication changes will be discussed during procedure appointments. . No disability issues will be discussed.  Reasons to call and reschedule or cancel your procedure: (Following these recommendations will minimize the risk of a serious complication.) . Surgeries: Avoid having procedures within 2 weeks of any surgery. (Avoid for 2 weeks before or after any surgery). . Flu Shots: Avoid having procedures within 2 weeks of a flu shots or . (Avoid for 2 weeks before or after immunizations). . Barium: Avoid having a procedure within 7-10 days after having had a radiological study involving the use of radiological contrast. (Myelograms, Barium swallow or enema study). . Heart attacks: Avoid any elective procedures or surgeries for the  initial 6 months after a "Myocardial Infarction" (Heart Attack). . Blood thinners: It is imperative that you stop these medications before procedures. Let us know if you if you take any blood thinner.  . Infection: Avoid procedures during or within two weeks of an infection (including chest colds or gastrointestinal problems). Symptoms associated with infections include: Localized redness, fever, chills, night sweats or profuse sweating, burning sensation when voiding, cough, congestion, stuffiness, runny nose, sore throat, diarrhea, nausea, vomiting, cold or Flu symptoms, recent or current infections. It is specially important if the infection is over the area that we intend to treat. Marland Kitchen Heart and lung problems: Symptoms that may suggest an active cardiopulmonary problem include: cough, chest pain, breathing difficulties or shortness of breath, dizziness, ankle swelling, uncontrolled high or unusually low blood pressure, and/or palpitations. If you are experiencing any of these symptoms, cancel your procedure and contact your primary care physician for an evaluation.  Remember:  Regular Business hours are:  Monday to Thursday 8:00 AM to 4:00 PM  Provider's Schedule: Milinda Pointer, MD:  Procedure days: Tuesday and Thursday 7:30 AM to 4:00 PM  Gillis Santa, MD:  Procedure days: Monday and Wednesday 7:30 AM to 4:00 PM ____________________________________________________________________________________________   ____________________________________________________________________________________________  Post-procedure Information What to expect: Most procedures involve the use of a local anesthetic (numbing medicine), and a steroid (anti-inflammatory medicine).  The local anesthetics may cause temporary numbness and weakness of the legs or arms, depending on the location of the block. This numbness/weakness may last 4-6 hours, depending on  the local anesthetic used. In rare instances, it can  last up to 24 hours. While numb, you must be very careful not to injure the extremity.  After any procedure, you could expect the pain to get better within 15-20 minutes. This relief is temporary and may last 4-6 hours. Once the local anesthetics wears off, you could experience discomfort, possibly more than usual, for up to 10 (ten) days. In the case of radiofrequencies, it may last up to 6 weeks. Surgeries may take up to 8 weeks for the healing process. The discomfort is due to the irritation caused by needles going through skin and muscle. To minimize the discomfort, we recommend using ice the first day, and heat from then on. The ice should be applied for 15 minutes on, and 15 minutes off. Keep repeating this cycle until bedtime. Avoid applying the ice directly to the skin, to prevent frostbite. Heat should be used daily, until the pain improves (4-10 days). Be careful not to burn yourself.  Occasionally you may experience muscle spasms or cramps. These occur as a consequence of the irritation caused by the needle sticks to the muscle and the blood that will inevitably be lost into the surrounding muscle tissue. Blood tends to be very irritating to tissues, which tend to react by going into spasm. These spasms may start the same day of your procedure, but they may also take days to develop. This late onset type of spasm or cramp is usually caused by electrolyte imbalances triggered by the steroids, at the level of the kidney. Cramps and spasms tend to respond well to muscle relaxants, multivitamins (some are triggered by the procedure, but may have their origins in vitamin deficiencies), and "Gatorade", or any sports drinks that can replenish any electrolyte imbalances. (If you are a diabetic, ask your pharmacist to get you a sugar-free brand.) Warm showers or baths may also be helpful. Stretching exercises are highly recommended.  General Instructions:  Be alert for signs of possible infection: redness,  swelling, heat, red streaks, elevated temperature, and/or fever. These typically appear 4 to 6 days after the procedure. Immediately notify your doctor if you experience unusual bleeding, difficulty breathing, or loss of bowel or bladder control. If you experience increased pain, do not increase your pain medicine intake, unless instructed by your pain physician.  Post-Procedure Care:  Be careful in moving about. Muscle spasms in the area of the injection may occur. Applying ice or heat to the area is often helpful. The incidence of spinal headaches after epidural injections ranges between 1.4% and 6%. If you develop a headache that does not seem to respond to conservative therapy, please let your physician know. This can be treated with an epidural blood patch.   Post-procedure numbness or redness is to be expected, however it should average 4 to 6 hours. If numbness and weakness of your extremities begins to develop 4 to 6 hours after your procedure, and is felt to be progressing and worsening, immediately contact your physician.  Diet:  If you experience nausea, do not eat until this sensation goes away. If you had a "Stellate Ganglion Block" for upper extremity "Reflex Sympathetic Dystrophy", do not eat or drink until your hoarseness goes away. In any case, always start with liquids first and if you tolerate them well, then slowly progress to more solid foods.  Activity:  For the first 4 to 6 hours after the procedure, use caution in moving about as you may experience numbness and/or weakness.  Use caution in cooking, using household electrical appliances, and climbing steps. If you need to reach your Doctor call our office: [phone] (During business hours) or [phone] (After business hours).  Business Hours: Monday-Thursday 8:00 am - 4:00 PM    Fridays: Closed     In case of an emergency: In case of emergency, call 911 or go to the nearest emergency room and have the physician  there call us.  Interpretation of Procedure Every nerve block has two components: a diagnostic component, and a treatment component. Unrealistic expectations are the most common causes of "perceived failure".  In a perfect world, a single nerve block should be able to completely and permanently eliminate the pain. Sadly, the world is not perfect.  Most pain management nerve blocks are performed using local anesthetics and steroids. Steroids are responsible for any long-term benefit that you may experience. Their purpose is to decrease any chronic swelling that may exist in the area. Steroids begin to work immediately after being injected. However, most patients will not experience any benefits until 5 to 10 days after the injection, when the swelling has come down to the point where they can tell a difference. Steroids will only help if there is swelling to be treated. As such, they can assist with the diagnosis. If effective, they suggest an inflammatory component to the pain, and if ineffective, they rule out inflammation as the main cause or component of the problem. If the problem is one of mechanical compression, you will get no benefit from those steroids.   In the case of local anesthetics, they have a crucial role in the diagnosis of your condition. Most will begin to work within15 to 20 minutes after injection. The duration will depend on the type used (short- vs. Long-acting). It is of outmost importance that patients keep tract of their pain, after the procedure. To assist with this matter, a "Post-procedure Pain Diary" is provided. Make sure to complete it and to bring it back to your follow-up appointment.  As long as the patient keeps accurate, detailed records of their symptoms after every procedure, and returns to have those interpreted, every procedure will provide [place] with invaluable information. Even a block that does not provide the patient with any relief, will always provide [place] with  information about the mechanism and the origin of the pain. The only time a nerve block can be considered a waste of time is when patients do not keep track of the results, or do not keep their post-procedure appointment.  Reporting the results back to your physician The Pain Score  Pain is a subjective complaint. It cannot be seen, touched, or measured. We depend entirely on the patient's report of the pain in order to assess your condition and treatment. To evaluate the pain, we use a pain scale, where "0" means "No Pain", and a "10" is "the worst possible pain that you can even imagine" (i.e. something like been eaten alive by a shark or being torn apart by a lion).   Use the Pain Scale provided. You will frequently be asked to rate your pain. Please be accurate, remember that medical decisions will be based on your responses. Please do not rate your pain above a 10. Doing so is actually interpreted as "symptom magnification" (exaggeration). To put this into perspective, when you tell us that your pain is at a 10 (ten), what you are saying is that there is nothing we can do to make this pain  any worse. (Carefully think about that.) ____________________________________________________________________________________________

## 2018-07-15 ENCOUNTER — Telehealth: Payer: Self-pay

## 2018-07-15 NOTE — Telephone Encounter (Signed)
Post procedure phone call. Patient states she is doing good.  

## 2018-08-06 ENCOUNTER — Ambulatory Visit: Payer: Medicare Other | Attending: Pain Medicine | Admitting: Pain Medicine

## 2018-08-06 ENCOUNTER — Other Ambulatory Visit: Payer: Self-pay

## 2018-08-06 ENCOUNTER — Encounter: Payer: Self-pay | Admitting: Pain Medicine

## 2018-08-06 VITALS — BP 148/59 | HR 65 | Temp 98.1°F | Resp 18 | Ht 65.0 in | Wt 245.0 lb

## 2018-08-06 DIAGNOSIS — M797 Fibromyalgia: Secondary | ICD-10-CM

## 2018-08-06 DIAGNOSIS — G8929 Other chronic pain: Secondary | ICD-10-CM

## 2018-08-06 DIAGNOSIS — M25562 Pain in left knee: Secondary | ICD-10-CM | POA: Insufficient documentation

## 2018-08-06 DIAGNOSIS — M19012 Primary osteoarthritis, left shoulder: Secondary | ICD-10-CM | POA: Diagnosis present

## 2018-08-06 DIAGNOSIS — M25511 Pain in right shoulder: Secondary | ICD-10-CM | POA: Diagnosis present

## 2018-08-06 DIAGNOSIS — M19011 Primary osteoarthritis, right shoulder: Secondary | ICD-10-CM | POA: Insufficient documentation

## 2018-08-06 DIAGNOSIS — M170 Bilateral primary osteoarthritis of knee: Secondary | ICD-10-CM

## 2018-08-06 DIAGNOSIS — M792 Neuralgia and neuritis, unspecified: Secondary | ICD-10-CM | POA: Diagnosis present

## 2018-08-06 DIAGNOSIS — M25561 Pain in right knee: Secondary | ICD-10-CM | POA: Diagnosis present

## 2018-08-06 DIAGNOSIS — M7551 Bursitis of right shoulder: Secondary | ICD-10-CM

## 2018-08-06 MED ORDER — GABAPENTIN 300 MG PO CAPS: 300.0000 mg | ORAL_CAPSULE | Freq: Four times a day (QID) | ORAL | 0 refills | Status: DC

## 2018-08-06 MED ORDER — SODIUM HYALURONATE (VISCOSUP) 20 MG/2ML IX SOSY
2.0000 mL | PREFILLED_SYRINGE | Freq: Once | INTRA_ARTICULAR | Status: AC
  Administered 2018-08-06: 2 mL via INTRA_ARTICULAR

## 2018-08-06 MED ORDER — ROPIVACAINE HCL 2 MG/ML IJ SOLN
4.0000 mL | Freq: Once | INTRAMUSCULAR | Status: AC
  Administered 2018-08-06: 4 mL via INTRA_ARTICULAR
  Filled 2018-08-06: qty 10

## 2018-08-06 MED ORDER — LIDOCAINE HCL (PF) 1 % IJ SOLN
4.0000 mL | Freq: Once | INTRAMUSCULAR | Status: AC
  Administered 2018-08-06: 4 mL
  Filled 2018-08-06: qty 5

## 2018-08-06 NOTE — Patient Instructions (Addendum)
____________________________________________________________________________________________  Post-Procedure Discharge Instructions  Instructions:  Apply ice: Fill a plastic sandwich bag with crushed ice. Cover it with a small towel and apply to injection site. Apply for 15 minutes then remove x 15 minutes. Repeat sequence on day of procedure, until you go to bed. The purpose is to minimize swelling and discomfort after procedure.  Apply heat: Apply heat to procedure site starting the day following the procedure. The purpose is to treat any soreness and discomfort from the procedure.  Food intake: Start with clear liquids (like water) and advance to regular food, as tolerated.   Physical activities: Keep activities to a minimum for the first 8 hours after the procedure.   Driving: If you have received any sedation, you are not allowed to drive for 24 hours after your procedure.  Blood thinner: Restart your blood thinner 6 hours after your procedure. (Only for those taking blood thinners)  Insulin: As soon as you can eat, you may resume your normal dosing schedule. (Only for those taking insulin)  Infection prevention: Keep procedure site clean and dry.  Post-procedure Pain Diary: Extremely important that this be done correctly and accurately. Recorded information will be used to determine the next step in treatment.  Pain evaluated is that of treated area only. Do not include pain from an untreated area.  Complete every hour, on the hour, for the initial 8 hours. Set an alarm to help you do this part accurately.  Do not go to sleep and have it completed later. It will not be accurate.  Follow-up appointment: Keep your follow-up appointment after the procedure. Usually 2 weeks for most procedures. (6 weeks in the case of radiofrequency.) Bring you pain diary.   Expect:  From numbing medicine (AKA: Local Anesthetics): Numbness or decrease in pain.  Onset: Full effect within 15  minutes of injected.  Duration: It will depend on the type of local anesthetic used. On the average, 1 to 8 hours.   From steroids: Decrease in swelling or inflammation. Once inflammation is improved, relief of the pain will follow.  Onset of benefits: Depends on the amount of swelling present. The more swelling, the longer it will take for the benefits to be seen. In some cases, up to 10 days.  Duration: Steroids will stay in the system x 2 weeks. Duration of benefits will depend on multiple posibilities including persistent irritating factors.  Occasional side-effects: Facial flushing (red, warm cheeks) , cramps (if present, drink Gatorade and take over-the-counter Magnesium 450-500 mg once to twice a day).  From procedure: Some discomfort is to be expected once the numbing medicine wears off. This should be minimal if ice and heat are applied as instructed.  Call if:  You experience numbness and weakness that gets worse with time, as opposed to wearing off.  New onset bowel or bladder incontinence. (This applies to Spinal procedures only)  Emergency Numbers:  Durning business hours (Monday - Thursday, 8:00 AM - 4:00 PM) (Friday, 9:00 AM - 12:00 Noon): [phone]  After hours: [phone] ____________________________________________________________________________________________   ____________________________________________________________________________________________  Preparing for Procedure with Sedation  Instructions: . Oral Intake: Do not eat or drink anything for at least 8 hours prior to your procedure. . Transportation: Public transportation is not allowed. Bring an adult driver. The driver must be physically present in our waiting room before any procedure can be started. . Physical Assistance: Bring an adult physically capable of assisting you, in the event you need help. This adult should   keep you company at home for at least 6 hours after the  procedure. . Blood Pressure Medicine: Take your blood pressure medicine with a sip of water the morning of the procedure. . Blood thinners: Notify our staff if you are taking any blood thinners. Depending on which one you take, there will be specific instructions on how and when to stop it. . Diabetics on insulin: Notify the staff so that you can be scheduled 1st case in the morning. If your diabetes requires high dose insulin, take only  of your normal insulin dose the morning of the procedure and notify the staff that you have done so. . Preventing infections: Shower with an antibacterial soap the morning of your procedure. . Build-up your immune system: Take 1000 mg of Vitamin C with every meal (3 times a day) the day prior to your procedure. . Antibiotics: Inform the staff if you have a condition or reason that requires you to take antibiotics before dental procedures. . Pregnancy: If you are pregnant, call and cancel the procedure. . Sickness: If you have a cold, fever, or any active infections, call and cancel the procedure. . Arrival: You must be in the facility at least 30 minutes prior to your scheduled procedure. . Children: Do not bring children with you. . Dress appropriately: Bring dark clothing that you would not mind if they get stained. . Valuables: Do not bring any jewelry or valuables.  Procedure appointments are reserved for interventional treatments only. . No Prescription Refills. . No medication changes will be discussed during procedure appointments. . No disability issues will be discussed.  Reasons to call and reschedule or cancel your procedure: (Following these recommendations will minimize the risk of a serious complication.) . Surgeries: Avoid having procedures within 2 weeks of any surgery. (Avoid for 2 weeks before or after any surgery). . Flu Shots: Avoid having procedures within 2 weeks of a flu shots or . (Avoid for 2 weeks before or after  immunizations). . Barium: Avoid having a procedure within 7-10 days after having had a radiological study involving the use of radiological contrast. (Myelograms, Barium swallow or enema study). . Heart attacks: Avoid any elective procedures or surgeries for the initial 6 months after a "Myocardial Infarction" (Heart Attack). . Blood thinners: It is imperative that you stop these medications before procedures. Let us know if you if you take any blood thinner.  . Infection: Avoid procedures during or within two weeks of an infection (including chest colds or gastrointestinal problems). Symptoms associated with infections include: Localized redness, fever, chills, night sweats or profuse sweating, burning sensation when voiding, cough, congestion, stuffiness, runny nose, sore throat, diarrhea, nausea, vomiting, cold or Flu symptoms, recent or current infections. It is specially important if the infection is over the area that we intend to treat. . Heart and lung problems: Symptoms that may suggest an active cardiopulmonary problem include: cough, chest pain, breathing difficulties or shortness of breath, dizziness, ankle swelling, uncontrolled high or unusually low blood pressure, and/or palpitations. If you are experiencing any of these symptoms, cancel your procedure and contact your primary care physician for an evaluation.  Remember:  Regular Business hours are:  Monday to Thursday 8:00 AM to 4:00 PM  Provider's Schedule: Anshika Pethtel, MD:  Procedure days: Tuesday and Thursday 7:30 AM to 4:00 PM  Bilal Lateef, MD:  Procedure days: Monday and Wednesday 7:30 AM to 4:00 PM ____________________________________________________________________________________________    

## 2018-08-06 NOTE — Progress Notes (Addendum)
Patient's Name: Sabrina Green  MRN: [id number]  Referring Provider: Alanson Aly, FNP  DOB: 1946/04/06  PCP: Alanson Aly, FNP  DOS: 08/06/2018  Note by: Gaspar Cola, MD  Service setting: Ambulatory outpatient  Specialty: Interventional Pain Management  Patient type: Established  Location: [facility] (AMB) Pain Management Facility  Visit type: Interventional Procedure   Primary Reason for Visit: Interventional Pain Management Treatment. CC: No chief complaint on file.  Procedure:          Anesthesia, Analgesia, Anxiolysis:  Type: Therapeutic Intra-Articular Hyalgan Knee Injection #5  Region: Lateral infrapatellar Knee Region Level: Knee Joint Laterality: Bilateral  Type: Local Anesthesia Indication(s): Analgesia         Local Anesthetic: Lidocaine 1-2% Route: Infiltration ([place]/IM) IV Access: Declined Sedation: Declined   Position: Sitting   Indications: 1. Chronic knee pain (Primary Area of Pain) (Bilateral) (L>R)   2. Osteoarthritis of knees (Bilateral)   3. Tricompartmental disease of knee (Bilateral)    Pain Score: Pre-procedure: 0-No pain/10 Post-procedure: 0-No pain/10  Pre-op Assessment:  Sabrina Green is a 73 y.o. (year old), female patient, seen today for interventional treatment. She  has a past surgical history that includes Tonsillectomy; Cesarean section; Tubal ligation; Abdominal hysterectomy; Knee arthroscopy; Cholecystectomy; Abdominal hysterectomy; Ankle fracture surgery (Right, 1989); and Eye surgery (Bilateral, 2015). Sabrina Green has a current medication list which includes the following prescription(s): allopurinol, calcium-vitamin d, cholecalciferol, diazepam, fluticasone, furosemide, gabapentin, losartan, lubiprostone, magnesium, methotrexate, methylprednisolone, mupirocin ointment, omeprazole, promethazine, propranolol, sertraline, and tizanidine. Her primarily concern today is the No chief complaint on file.  Initial Vital Signs:  Pulse/HCG Rate: 67   Temp: 98.1 F (36.7 C) Resp: 18 BP: (!) 144/63 SpO2: 99 %  BMI: Estimated body mass index is 40.77 kg/m as calculated from the following:   Height as of this encounter: 5\' 5"  (1.651 m).   Weight as of this encounter: 245 lb (111.1 kg).  Risk Assessment: Allergies: Reviewed. She is allergic to penicillins.  Allergy Precautions: None required Coagulopathies: Reviewed. None identified.  Blood-thinner therapy: None at this time Active Infection(s): Reviewed. None identified. Sabrina Green is afebrile  Site Confirmation: Sabrina Green was asked to confirm the procedure and laterality before marking the site Procedure checklist: Completed Consent: Before the procedure and under the influence of no sedative(s), amnesic(s), or anxiolytics, the patient was informed of the treatment options, risks and possible complications. To fulfill our ethical and legal obligations, as recommended by the American Medical Association's Code of Ethics, I have informed the patient of my clinical impression; the nature and purpose of the treatment or procedure; the risks, benefits, and possible complications of the intervention; the alternatives, including doing nothing; the risk(s) and benefit(s) of the alternative treatment(s) or procedure(s); and the risk(s) and benefit(s) of doing nothing. The patient was provided information about the general risks and possible complications associated with the procedure. These may include, but are not limited to: failure to achieve desired goals, infection, bleeding, organ or nerve damage, allergic reactions, paralysis, and death. In addition, the patient was informed of those risks and complications associated to the procedure, such as failure to decrease pain; infection; bleeding; organ or nerve damage with subsequent damage to sensory, motor, and/or autonomic systems, resulting in permanent pain, numbness, and/or weakness of one or several areas of the body; allergic reactions;  (i.e.: anaphylactic reaction); and/or death. Furthermore, the patient was informed of those risks and complications associated with the medications. These include, but are not limited to: allergic reactions (i.e.:  anaphylactic or anaphylactoid reaction(s)); adrenal axis suppression; blood sugar elevation that in diabetics may result in ketoacidosis or comma; water retention that in patients with history of congestive heart failure may result in shortness of breath, pulmonary edema, and decompensation with resultant heart failure; weight gain; swelling or edema; medication-induced neural toxicity; particulate matter embolism and blood vessel occlusion with resultant organ, and/or nervous system infarction; and/or aseptic necrosis of one or more joints. Finally, the patient was informed that Medicine is not an exact science; therefore, there is also the possibility of unforeseen or unpredictable risks and/or possible complications that may result in a catastrophic outcome. The patient indicated having understood very clearly. We have given the patient no guarantees and we have made no promises. Enough time was given to the patient to ask questions, all of which were answered to the patient's satisfaction. Sabrina Green has indicated that she wanted to continue with the procedure. Attestation: I, the ordering provider, attest that I have discussed with the patient the benefits, risks, side-effects, alternatives, likelihood of achieving goals, and potential problems during recovery for the procedure that I have provided informed consent. Date  Time: 08/06/2018  2:05 PM  Pre-Procedure Preparation:  Monitoring: As per clinic protocol. Respiration, ETCO2, SpO2, BP, heart rate and rhythm monitor placed and checked for adequate function Safety Precautions: Patient was assessed for positional comfort and pressure points before starting the procedure. Time-out: I initiated and conducted the "Time-out" before starting the  procedure, as per protocol. The patient was asked to participate by confirming the accuracy of the "Time Out" information. Verification of the correct person, site, and procedure were performed and confirmed by me, the nursing staff, and the patient. "Time-out" conducted as per Joint Commission's Universal Protocol (UP.01.01.01). Time: 1411  Description of Procedure:          Target Area: Knee Joint Approach: Just above the Lateral tibial plateau, lateral to the infrapatellar tendon. Area Prepped: Entire knee area, from the mid-thigh to the mid-shin. Prepping solution: ChloraPrep (2% chlorhexidine gluconate and 70% isopropyl alcohol) Safety Precautions: Aspiration looking for blood return was conducted prior to all injections. At no point did we inject any substances, as a needle was being advanced. No attempts were made at seeking any paresthesias. Safe injection practices and needle disposal techniques used. Medications properly checked for expiration dates. SDV (single dose vial) medications used. Description of the Procedure: Protocol guidelines were followed. The patient was placed in position over the fluoroscopy table. The target area was identified and the area prepped in the usual manner. Skin & deeper tissues infiltrated with local anesthetic. Appropriate amount of time allowed to pass for local anesthetics to take effect. The procedure needles were then advanced to the target area. Proper needle placement secured. Negative aspiration confirmed. Solution injected in intermittent fashion, asking for systemic symptoms every 0.5cc of injectate. The needles were then removed and the area cleansed, making sure to leave some of the prepping solution back to take advantage of its long term bactericidal properties. Vitals:   08/06/18 1402 08/06/18 1405  BP: (!) 144/63 (!) 148/59  Pulse: 67 65  Resp: 18   Temp: 98.1 F (36.7 C)   SpO2: 99%   Weight: 245 lb (111.1 kg)   Height: 5\' 5"  (1.651 m)      Start Time: 1412 hrs. End Time: 1418 hrs. Materials:  Needle(s) Type: Regular needle Gauge: 25G Length: 1.5-in Medication(s): Please see orders for medications and dosing details.  Imaging Guidance:  Type of Imaging Technique: None used Indication(s): N/A Exposure Time: No patient exposure Contrast: None used. Fluoroscopic Guidance: N/A Ultrasound Guidance: N/A Interpretation: N/A  Antibiotic Prophylaxis:   Anti-infectives (From admission, onward)   None     Indication(s): None identified  Post-operative Assessment:  Post-procedure Vital Signs:  Pulse/HCG Rate: 65  Temp: 98.1 F (36.7 C) Resp: 18 BP: (!) 148/59 SpO2: 99 %  EBL: None  Complications: No immediate post-treatment complications observed by team, or reported by patient.  Note: The patient tolerated the entire procedure well. A repeat set of vitals were taken after the procedure and the patient was kept under observation following institutional policy, for this type of procedure. Post-procedural neurological assessment was performed, showing return to baseline, prior to discharge. The patient was provided with post-procedure discharge instructions, including a section on how to identify potential problems. Should any problems arise concerning this procedure, the patient was given instructions to immediately contact us, at any time, without hesitation. In any case, we plan to contact the patient by telephone for a follow-up status report regarding this interventional procedure.  Comments:  No additional relevant information.  Plan of Care   Possible POC:  Diagnostic right-sided intra-articular shoulder joint injection #1 under fluoroscopic guidance and IV sedation in approximately 2 weeks from now.   Imaging Orders  No imaging studies ordered today    Procedure Orders     KNEE INJECTION     SHOULDER INJECTION  Medications ordered for procedure: Meds ordered this encounter  Medications  .  lidocaine (PF) (XYLOCAINE) 1 % injection 4 mL  . ropivacaine (PF) 2 mg/mL (0.2%) (NAROPIN) injection 4 mL  . Sodium Hyaluronate SOSY 2 mL  . Sodium Hyaluronate SOSY 2 mL  . gabapentin (NEURONTIN) 300 MG capsule    Sig: Take 1 capsule (300 mg total) by mouth 4 (four) times daily.    Dispense:  120 capsule    Refill:  0    Do not place medication on "Automatic Refill". Fill one day early if pharmacy is closed on scheduled refill date.   Medications administered: We administered lidocaine (PF), ropivacaine (PF) 2 mg/mL (0.2%), Sodium Hyaluronate, and Sodium Hyaluronate.  See the medical record for exact dosing, route, and time of administration.  Disposition: Discharge home  Discharge Date & Time: 08/06/2018; 1421 hrs.   Physician-requested Follow-up: Return for PPE (2 wks) + Procedure (w/ sedation): (R) Shoulder inj. #1.  Future Appointments  Date Time Provider [place]  08/20/2018  9:45 AM Milinda Pointer, MD [facility] None   Primary Care Physician: Alanson Aly, FNP Location: [facility] Note by: Gaspar Cola, MD Date: 08/06/2018; Time: 3:28 PM  Disclaimer:  Medicine is not an Chief Strategy Officer. The only guarantee in medicine is that nothing is guaranteed. It is important to note that the decision to proceed with this intervention was based on the information collected from the patient. The Data and conclusions were drawn from the patient's questionnaire, the interview, and the physical examination. Because the information was provided in large part by the patient, it cannot be guaranteed that it has not been purposely or unconsciously manipulated. Every effort has been made to obtain as much relevant data as possible for this evaluation. It is important to note that the conclusions that lead to this procedure are derived in large part from the available data. Always take into account that the treatment will also be dependent on availability of  resources and existing treatment guidelines, considered by  other Pain Management Practitioners as being common knowledge and practice, at the time of the intervention. For Medico-Legal purposes, it is also important to point out that variation in procedural techniques and pharmacological choices are the acceptable norm. The indications, contraindications, technique, and results of the above procedure should only be interpreted and judged by a Board-Certified Interventional Pain Specialist with extensive familiarity and expertise in the same exact procedure and technique.

## 2018-08-06 NOTE — Progress Notes (Signed)
Safety precautions to be maintained throughout the outpatient stay will include: orient to surroundings, keep bed in low position, maintain call bell within reach at all times, provide assistance with transfer out of bed and ambulation.  

## 2018-08-06 NOTE — Addendum Note (Signed)
Addended by: Milinda Pointer A on: 08/06/2018 03:28 PM   Modules accepted: Orders

## 2018-08-07 ENCOUNTER — Telehealth: Payer: Self-pay

## 2018-08-07 NOTE — Telephone Encounter (Signed)
Post procedure phone call.  Patient states she is doing fine.  

## 2018-08-20 ENCOUNTER — Ambulatory Visit
Admission: RE | Admit: 2018-08-20 | Discharge: 2018-08-20 | Disposition: A | Discharge status: Home / Self Care | Payer: Medicare Other | Source: Ambulatory Visit | Attending: Pain Medicine | Admitting: Pain Medicine

## 2018-08-20 ENCOUNTER — Other Ambulatory Visit: Payer: Self-pay

## 2018-08-20 ENCOUNTER — Encounter: Payer: Self-pay | Admitting: Pain Medicine

## 2018-08-20 ENCOUNTER — Ambulatory Visit (HOSPITAL_BASED_OUTPATIENT_CLINIC_OR_DEPARTMENT_OTHER): Payer: Medicare Other | Admitting: Pain Medicine

## 2018-08-20 VITALS — BP 174/76 | HR 66 | Temp 98.1°F | Resp 15 | Ht 65.0 in | Wt 240.0 lb

## 2018-08-20 DIAGNOSIS — M19011 Primary osteoarthritis, right shoulder: Secondary | ICD-10-CM

## 2018-08-20 DIAGNOSIS — M19012 Primary osteoarthritis, left shoulder: Secondary | ICD-10-CM | POA: Insufficient documentation

## 2018-08-20 DIAGNOSIS — M25512 Pain in left shoulder: Secondary | ICD-10-CM | POA: Diagnosis present

## 2018-08-20 DIAGNOSIS — G8929 Other chronic pain: Secondary | ICD-10-CM | POA: Diagnosis present

## 2018-08-20 DIAGNOSIS — M25511 Pain in right shoulder: Secondary | ICD-10-CM

## 2018-08-20 MED ORDER — ROPIVACAINE HCL 2 MG/ML IJ SOLN
9.0000 mL | Freq: Once | INTRAMUSCULAR | Status: AC
  Administered 2018-08-20: 9 mL via INTRA_ARTICULAR
  Filled 2018-08-20: qty 10

## 2018-08-20 MED ORDER — LIDOCAINE HCL 2 % IJ SOLN
20.0000 mL | Freq: Once | INTRAMUSCULAR | Status: AC
  Administered 2018-08-20: 400 mg
  Filled 2018-08-20: qty 40

## 2018-08-20 MED ORDER — METHYLPREDNISOLONE ACETATE 80 MG/ML IJ SUSP
80.0000 mg | Freq: Once | INTRAMUSCULAR | Status: AC
  Administered 2018-08-20: 80 mg via INTRA_ARTICULAR
  Filled 2018-08-20: qty 1

## 2018-08-20 NOTE — Progress Notes (Addendum)
Patient's Name: Sabrina Green  MRN: [id number]  Referring Provider: Alanson Aly, FNP  DOB: [birth year]  PCP: Alanson Aly, FNP  DOS: 08/20/2018  Note by: Gaspar Cola, MD  Service setting: Ambulatory outpatient  Specialty: Interventional Pain Management  Patient type: Established  Location: [facility] (AMB) Pain Management Facility  Visit type: Interventional Procedure   Primary Reason for Visit: Interventional Pain Management Treatment. CC: Shoulder Pain (bilateral)  Procedure:          Anesthesia, Analgesia, Anxiolysis:  Type: Diagnostic Glenohumeral Joint (shoulder), AC joint, & subdeltoid bursa  Injection #1  Primary Purpose: Diagnostic Region: Superior Shoulder Area Level:  Shoulder Target Area: Glenohumeral Joint (shoulder) Approach: Anterior approach. Laterality: Bilateral  Type: Local Anesthesia Indication(s): Analgesia         Route: Infiltration ([place]/IM) IV Access: Declined Sedation: None since a driver was not available  Local Anesthetic: Lidocaine 1-2%  Position: Supine   Indications: 1. Chronic shoulder pain (Bilateral)   2. Osteoarthritis of shoulder (Bilateral)    Pain Score: Pre-procedure: 0-No pain/10 Post-procedure: 0-No pain/10  Pre-op Assessment:  Sabrina Green is a 73 y.o. (year old), female patient, seen today for interventional treatment. She  has a past surgical history that includes Tonsillectomy; Cesarean section; Tubal ligation; Abdominal hysterectomy; Knee arthroscopy; Cholecystectomy; Abdominal hysterectomy; Ankle fracture surgery (Right, 1989); and Eye surgery (Bilateral, 2015). Sabrina Green has a current medication list which includes the following prescription(s): allopurinol, calcium-vitamin d, cholecalciferol, fluticasone, furosemide, gabapentin, losartan, lubiprostone, magnesium, methotrexate, mupirocin ointment, omeprazole, promethazine, propranolol, sertraline, tizanidine, diazepam, and methylprednisolone. Her primarily concern today is the  Shoulder Pain (bilateral)  Initial Vital Signs:  Pulse/HCG Rate: 66ECG Heart Rate: 64 Temp: 98.1 F (36.7 C) Resp: 18 BP: (!) 162/76 SpO2: 100 %  BMI: Estimated body mass index is 39.94 kg/m as calculated from the following:   Height as of this encounter: 5\' 5"  (1.651 m).   Weight as of this encounter: 240 lb (108.9 kg).  Risk Assessment: Allergies: Reviewed. She is allergic to penicillins.  Allergy Precautions: None required Coagulopathies: Reviewed. None identified.  Blood-thinner therapy: None at this time Active Infection(s): Reviewed. None identified. Sabrina Green is afebrile  Site Confirmation: Sabrina Green was asked to confirm the procedure and laterality before marking the site Procedure checklist: Completed Consent: Before the procedure and under the influence of no sedative(s), amnesic(s), or anxiolytics, the patient was informed of the treatment options, risks and possible complications. To fulfill our ethical and legal obligations, as recommended by the American Medical Association's Code of Ethics, I have informed the patient of my clinical impression; the nature and purpose of the treatment or procedure; the risks, benefits, and possible complications of the intervention; the alternatives, including doing nothing; the risk(s) and benefit(s) of the alternative treatment(s) or procedure(s); and the risk(s) and benefit(s) of doing nothing. The patient was provided information about the general risks and possible complications associated with the procedure. These may include, but are not limited to: failure to achieve desired goals, infection, bleeding, organ or nerve damage, allergic reactions, paralysis, and death. In addition, the patient was informed of those risks and complications associated to the procedure, such as failure to decrease pain; infection; bleeding; organ or nerve damage with subsequent damage to sensory, motor, and/or autonomic systems, resulting in permanent  pain, numbness, and/or weakness of one or several areas of the body; allergic reactions; (i.e.: anaphylactic reaction); and/or death. Furthermore, the patient was informed of those risks and complications associated with the medications. These include, but  are not limited to: allergic reactions (i.e.: anaphylactic or anaphylactoid reaction(s)); adrenal axis suppression; blood sugar elevation that in diabetics may result in ketoacidosis or comma; water retention that in patients with history of congestive heart failure may result in shortness of breath, pulmonary edema, and decompensation with resultant heart failure; weight gain; swelling or edema; medication-induced neural toxicity; particulate matter embolism and blood vessel occlusion with resultant organ, and/or nervous system infarction; and/or aseptic necrosis of one or more joints. Finally, the patient was informed that Medicine is not an exact science; therefore, there is also the possibility of unforeseen or unpredictable risks and/or possible complications that may result in a catastrophic outcome. The patient indicated having understood very clearly. We have given the patient no guarantees and we have made no promises. Enough time was given to the patient to ask questions, all of which were answered to the patient's satisfaction. Sabrina Green has indicated that she wanted to continue with the procedure. Attestation: I, the ordering provider, attest that I have discussed with the patient the benefits, risks, side-effects, alternatives, likelihood of achieving goals, and potential problems during recovery for the procedure that I have provided informed consent. Date  Time: 08/20/2018  8:56 AM  Pre-Procedure Preparation:  Monitoring: As per clinic protocol. Respiration, ETCO2, SpO2, BP, heart rate and rhythm monitor placed and checked for adequate function Safety Precautions: Patient was assessed for positional comfort and pressure points before  starting the procedure. Time-out: I initiated and conducted the "Time-out" before starting the procedure, as per protocol. The patient was asked to participate by confirming the accuracy of the "Time Out" information. Verification of the correct person, site, and procedure were performed and confirmed by me, the nursing staff, and the patient. "Time-out" conducted as per Joint Commission's Universal Protocol (UP.01.01.01). Time: 0954  Description of Procedure:          Area Prepped: Entire shoulder Area Prepping solution: ChloraPrep (2% chlorhexidine gluconate and 70% isopropyl alcohol) Safety Precautions: Aspiration looking for blood return was conducted prior to all injections. At no point did we inject any substances, as a needle was being advanced. No attempts were made at seeking any paresthesias. Safe injection practices and needle disposal techniques used. Medications properly checked for expiration dates. SDV (single dose vial) medications used. Description of the Procedure: Protocol guidelines were followed. The patient was placed in position over the procedure table. The target area was identified and the area prepped in the usual manner. Skin & deeper tissues infiltrated with local anesthetic. Appropriate amount of time allowed to pass for local anesthetics to take effect. The procedure needles were then advanced to the target area. Proper needle placement secured. Negative aspiration confirmed. Solution injected in intermittent fashion, asking for systemic symptoms every 0.5cc of injectate. The needles were then removed and the area cleansed, making sure to leave some of the prepping solution back to take advantage of its long term bactericidal properties.    Vitals:   08/20/18 0947 08/20/18 0952 08/20/18 0957 08/20/18 1007  BP: (!) 161/67 (!) 156/72 (!) 162/79 (!) 174/76  Pulse:      Resp: 15 13 12 15   Temp:      SpO2: 96% 100% 99% 100%  Weight:      Height:        Start Time: 0954  hrs. End Time: 1005 hrs. Materials:  Needle(s) Type: Spinal Needle Gauge: 22G Length: 3.5-in Medication(s): Please see orders for medications and dosing details.  Imaging Guidance (Non-Spinal):  Type of Imaging Technique: Fluoroscopy Guidance (Non-Spinal) Indication(s): Assistance in needle guidance and placement for procedures requiring needle placement in or near specific anatomical locations not easily accessible without such assistance. Exposure Time: Please see nurses notes. Contrast: None used. Fluoroscopic Guidance: I was personally present during the use of fluoroscopy. "Tunnel Vision Technique" used to obtain the best possible view of the target area. Parallax error corrected before commencing the procedure. "Direction-depth-direction" technique used to introduce the needle under continuous pulsed fluoroscopy. Once target was reached, antero-posterior, oblique, and lateral fluoroscopic projection used confirm needle placement in all planes. Images permanently stored in EMR. Interpretation: No contrast injected. I personally interpreted the imaging intraoperatively. Adequate needle placement confirmed in multiple planes. Permanent images saved into the patient's record.  Antibiotic Prophylaxis:   Anti-infectives (From admission, onward)   None     Indication(s): None identified  Post-operative Assessment:  Post-procedure Vital Signs:  Pulse/HCG Rate: 6665 Temp: 98.1 F (36.7 C) Resp: 15 BP: (!) 174/76 SpO2: 100 %  EBL: None  Complications: No immediate post-treatment complications observed by team, or reported by patient.  Note: The patient tolerated the entire procedure well. A repeat set of vitals were taken after the procedure and the patient was kept under observation following institutional policy, for this type of procedure. Post-procedural neurological assessment was performed, showing return to baseline, prior to discharge. The patient was provided with  post-procedure discharge instructions, including a section on how to identify potential problems. Should any problems arise concerning this procedure, the patient was given instructions to immediately contact us, at any time, without hesitation. In any case, we plan to contact the patient by telephone for a follow-up status report regarding this interventional procedure.  Comments:  No additional relevant information.  Plan of Care   Imaging Orders     DG C-Arm 1-60 Min-No Report  Procedure Orders     SHOULDER INJECTION  Medications ordered for procedure: Meds ordered this encounter  Medications  . lidocaine (XYLOCAINE) 2 % (with pres) injection 400 mg  . ropivacaine (PF) 2 mg/mL (0.2%) (NAROPIN) injection 9 mL  . methylPREDNISolone acetate (DEPO-MEDROL) injection 80 mg   Medications administered: We administered lidocaine, ropivacaine (PF) 2 mg/mL (0.2%), and methylPREDNISolone acetate.  See the medical record for exact dosing, route, and time of administration.  Disposition: Discharge home  Discharge Date & Time: 08/20/2018; 1013 hrs.   Physician-requested Follow-up: Return for post-procedure eval (2 wks), w/ Dr. Dossie Arbour.  Future Appointments  Date Time Provider [place]  09/07/2018 11:15 AM Milinda Pointer, MD [facility] None   Primary Care Physician: Alanson Aly, FNP Location: [facility] Out[facility] Facility Note by: Gaspar Cola, MD Date: 08/20/2018; Time: 12:35 PM  Disclaimer:  Medicine is not an exact science. The only guarantee in medicine is that nothing is guaranteed. It is important to note that the decision to proceed with this intervention was based on the information collected from the patient. The Data and conclusions were drawn from the patient's questionnaire, the interview, and the physical examination. Because the information was provided in large part by the patient, it cannot be guaranteed that it has not been purposely or  unconsciously manipulated. Every effort has been made to obtain as much relevant data as possible for this evaluation. It is important to note that the conclusions that lead to this procedure are derived in large part from the available data. Always take into account that the treatment will also be dependent on availability of resources and existing  treatment guidelines, considered by other Pain Management Practitioners as being common knowledge and practice, at the time of the intervention. For Medico-Legal purposes, it is also important to point out that variation in procedural techniques and pharmacological choices are the acceptable norm. The indications, contraindications, technique, and results of the above procedure should only be interpreted and judged by a Board-Certified Interventional Pain Specialist with extensive familiarity and expertise in the same exact procedure and technique.

## 2018-08-20 NOTE — Progress Notes (Signed)
Safety precautions to be maintained throughout the outpatient stay will include: orient to surroundings, keep bed in low position, maintain call bell within reach at all times, provide assistance with transfer out of bed and ambulation.  

## 2018-08-20 NOTE — Addendum Note (Signed)
Addended by: Milinda Pointer A on: 08/20/2018 12:35 PM   Modules accepted: Orders

## 2018-08-20 NOTE — Patient Instructions (Signed)

## 2018-08-21 ENCOUNTER — Telehealth: Payer: Self-pay

## 2018-08-21 NOTE — Telephone Encounter (Signed)
Pt was called, feeling fine but reported that the bandages pulled off her some of her skin.

## 2018-09-07 ENCOUNTER — Ambulatory Visit: Payer: Medicare Other | Attending: Pain Medicine | Admitting: Pain Medicine

## 2018-09-07 ENCOUNTER — Encounter: Payer: Self-pay | Admitting: Pain Medicine

## 2018-09-07 ENCOUNTER — Other Ambulatory Visit: Payer: Self-pay

## 2018-09-07 VITALS — BP 153/66 | HR 56 | Temp 98.4°F | Resp 18 | Ht 65.0 in | Wt 245.0 lb

## 2018-09-07 DIAGNOSIS — M25561 Pain in right knee: Secondary | ICD-10-CM

## 2018-09-07 DIAGNOSIS — G8929 Other chronic pain: Secondary | ICD-10-CM | POA: Insufficient documentation

## 2018-09-07 DIAGNOSIS — M25562 Pain in left knee: Secondary | ICD-10-CM | POA: Diagnosis not present

## 2018-09-07 DIAGNOSIS — M545 Low back pain: Secondary | ICD-10-CM | POA: Diagnosis not present

## 2018-09-07 DIAGNOSIS — M79605 Pain in left leg: Secondary | ICD-10-CM | POA: Diagnosis not present

## 2018-09-07 DIAGNOSIS — M25511 Pain in right shoulder: Secondary | ICD-10-CM | POA: Insufficient documentation

## 2018-09-07 NOTE — Progress Notes (Signed)
Patient's Name: Sabrina Green  MRN: [id number]  Referring Provider: Alanson Aly, FNP  DOB: 1945-12-28  PCP: Alanson Aly, FNP  DOS: 09/07/2018  Note by: Gaspar Cola, MD  Service setting: Ambulatory outpatient  Specialty: Interventional Pain Management  Location: [facility] (AMB) Pain Management Facility    Patient type: Established   Primary Reason(s) for Visit: Encounter for post-procedure evaluation of chronic illness with mild to moderate exacerbation CC: Shoulder Pain (right)  HPI  Sabrina Green is a 73 y.o. year old, female patient, who comes today for a post-procedure evaluation. She has BMI 40.0-44.9, adult ([place]); Shoulder bursitis (Right); Chronic gout; Chronic low back pain (Midline); CKD (chronic kidney disease) stage 3, GFR 30-59 ml/min (HCC); Elevated erythrocyte sedimentation rate; DDD (degenerative disc disease), lumbar; Encounter for long-term (current) use of high-risk medication; Essential hypertension; Exercise-induced asthma; Fatty liver disease, nonalcoholic; Fibromyalgia; GERD without esophagitis; IBS (irritable bowel syndrome); Long term current use of opiate analgesic; Osteoarthritis of knee (Left); Rheumatoid arthritis involving multiple sites [facility]); Vitamin B12 deficiency; Vitamin D deficiency; Chronic pain syndrome; Pharmacologic therapy; Disorder of skeletal system; Problems influencing health status; Chronic knee pain (Primary Area of Pain) (Bilateral) (L>R); Chronic lower extremity pain (Tertiary Area of Pain) (Left); Chronic shoulder pain (Fourth Area of Pain) (Right); Chronic hip pain (Left); Elevated C-reactive protein (CRP); History of claustrophobia; History of panic attacks; Chronic shoulder pain (Bilateral); Osteoarthritis of shoulder (Bilateral); Osteoarthritis of knees (Bilateral); Tricompartmental disease of knee (Bilateral); Chronic low back pain (Secondary Area of Pain) (Bilateral) (L>R); Lumbar facet syndrome (Bilateral) (L>R); Spondylosis without  myelopathy or radiculopathy, lumbosacral region; Neurogenic pain; and Osteoarthritis of both knees on their problem list. Her primarily concern today is the Shoulder Pain (right)  Pain Assessment: Location: Right, Left Shoulder Radiating: right arm Onset: More than a month ago Duration: Chronic pain Quality: Sharp Severity: 0-No pain/10 (subjective, self-reported pain score)  Note: Reported level is compatible with observation.                         When using our objective Pain Scale, levels between 6 and 10/10 are said to belong in an emergency room, as it progressively worsens from a 6/10, described as severely limiting, requiring emergency care not usually available at an outpatient pain management facility. At a 6/10 level, communication becomes difficult and requires great effort. Assistance to reach the emergency department may be required. Facial flushing and profuse sweating along with potentially dangerous increases in heart rate and blood pressure will be evident. Timing: Intermittent Modifying factors: procedures BP:    HR: (!) 56  Sabrina Green comes in today for post-procedure evaluation.  Further details on both, my assessment(s), as well as the proposed treatment plan, please see below.  Post-Procedure Assessment  08/20/2018 Procedure: Diagnostic  bilateral glenohumeral Joint (shoulder), AC joint, & subdeltoid bursa  Injection #1  under fluoroscopic guidance, no sedation Pre-procedure pain score:  0/10 Post-procedure pain score: 0/10         Influential Factors: BMI: 40.77 kg/m Intra-procedural challenges: None observed.         Assessment challenges: None detected.              Reported side-effects: None.        Post-procedural adverse reactions or complications: None reported         Sedation: No sedation used. When no sedatives are used, the analgesic levels obtained are directly associated to the effectiveness of the local anesthetics. However, when sedation  is  provided, the level of analgesia obtained during the initial 1 hour following the intervention, is believed to be the result of a combination of factors. These factors may include, but are not limited to: 1. The effectiveness of the local anesthetics used. 2. The effects of the analgesic(s) and/or anxiolytic(s) used. 3. The degree of discomfort experienced by the patient at the time of the procedure. 4. The patients ability and reliability in recalling and recording the events. 5. The presence and influence of possible secondary gains and/or psychosocial factors. Reported result: Relief experienced during the 1st hour after the procedure: 100 % (Ultra-Short Term Relief)            Interpretative annotation: Clinically appropriate result. No IV Analgesic or Anxiolytic given, therefore benefits are completely due to Local Anesthetic effects.          Effects of local anesthetic: The analgesic effects attained during this period are directly associated to the localized infiltration of local anesthetics and therefore cary significant diagnostic value as to the etiological location, or anatomical origin, of the pain. Expected duration of relief is directly dependent on the pharmacodynamics of the local anesthetic used. Long-acting (4-6 hours) anesthetics used.  Reported result: Relief during the next 4 to 6 hour after the procedure: 100 % (Short-Term Relief)            Interpretative annotation: Clinically appropriate result. Analgesia during this period is likely to be Local Anesthetic-related.          Long-term benefit: Defined as the period of time past the expected duration of local anesthetics (1 hour for short-acting and 4-6 hours for long-acting). With the possible exception of prolonged sympathetic blockade from the local anesthetics, benefits during this period are typically attributed to, or associated with, other factors such as analgesic sensory neuropraxia, antiinflammatory effects, or  beneficial biochemical changes provided by agents other than the local anesthetics.  Reported result: Extended relief following procedure: 100 % (Long-Term Relief)            Interpretative annotation: Clinically possible results. Good relief. No permanent benefit expected. Inflammation plays a part in the etiology to the pain. Etiology is likely inflammatory and mechanical.  Current benefits: Defined as reported results that persistent at this point in time.   Analgesia: 100 % However, she still having some discomfort when attempting full range of motion on the right shoulder. Function: Sabrina Green reports improvement in function ROM: Sabrina Green reports improvement in ROM Interpretative annotation: Ongoing benefit. Therapeutic success. Effective therapeutic approach.          Interpretation: Results would suggest a successful diagnostic intervention.                  Plan:  Set up procedure as a PRN palliative treatment option for this patient.                Laboratory Chemistry  Inflammation Markers (CRP: Acute Phase) (ESR: Chronic Phase) Lab Results  Component Value Date   CRP 19.5 (H) 12/11/2017   ESRSEDRATE 90 (H) 12/11/2017                         Rheumatology Markers Lab Results  Component Value Date   RF <10.0 12/11/2017   ANA Negative 12/11/2017                        Renal Markers Lab Results  Component Value Date  BUN 24 12/11/2017   CREATININE 1.54 (H) 12/11/2017   BCR 16 12/11/2017   GFRAA 39 (L) 12/11/2017   GFRNONAA 34 (L) 12/11/2017                             Hepatic Markers Lab Results  Component Value Date   AST 14 12/11/2017   ALBUMIN 3.9 12/11/2017                        Neuropathy Markers Lab Results  Component Value Date   VITAMINB12 358 12/11/2017                        Hematology Parameters Lab Results  Component Value Date   PLT 132 (L) 09/07/2017   HGB 12.2 09/07/2017   HCT 36.7 09/07/2017                        CV Markers Lab  Results  Component Value Date   TROPONINI <0.03 09/07/2017                         Note: Lab results reviewed.  Recent Imaging Results   Results for orders placed in visit on 08/20/18  DG C-Arm 1-60 Min-No Report   Narrative Fluoroscopy was utilized by the requesting physician.  No radiographic  interpretation.    Interpretation Report: Fluoroscopy was used during the procedure to assist with needle guidance. The images were interpreted intraoperatively by the requesting physician.  Meds   Current Outpatient Medications:  .  allopurinol (ZYLOPRIM) 300 MG tablet, Take 300 mg by mouth daily., Disp: , Rfl:  .  calcium-vitamin D 250-100 MG-UNIT tablet, Take 1 tablet by mouth 2 (two) times daily., Disp: , Rfl:  .  cholecalciferol (VITAMIN D) 400 units TABS tablet, Take 2,000 Units by mouth. , Disp: , Rfl:  .  fluticasone (FLONASE) 50 MCG/ACT nasal spray, Place 2 sprays into both nostrils daily., Disp: , Rfl:  .  furosemide (LASIX) 10 MG/ML solution, Take by mouth daily., Disp: , Rfl:  .  gabapentin (NEURONTIN) 300 MG capsule, Take 1 capsule (300 mg total) by mouth 4 (four) times daily., Disp: 120 capsule, Rfl: 0 .  losartan (COZAAR) 100 MG tablet, Take 100 mg by mouth daily., Disp: , Rfl:  .  lubiprostone (AMITIZA) 24 MCG capsule, Take 24 mcg by mouth 2 (two) times daily with a meal., Disp: , Rfl:  .  Magnesium 400 MG TABS, Take by mouth., Disp: , Rfl:  .  methotrexate (RHEUMATREX) 2.5 MG tablet, Take 7.5 mg by mouth 3 (three) times a week., Disp: , Rfl:  .  mupirocin ointment (BACTROBAN) 2 %, Apply 1 application topically 3 (three) times daily., Disp: 22 g, Rfl: 0 .  omeprazole (PRILOSEC) 20 MG capsule, Take 20 mg by mouth daily., Disp: , Rfl:  .  promethazine (PHENERGAN) 25 MG tablet, Take 25 mg by mouth every 6 (six) hours as needed for nausea or vomiting., Disp: , Rfl:  .  propranolol (INDERAL) 80 MG tablet, Take 80 mg by mouth daily., Disp: , Rfl:  .  sertraline (ZOLOFT) 100 MG  tablet, Take 100 mg by mouth daily., Disp: , Rfl:  .  tiZANidine (ZANAFLEX) 4 MG capsule, Take 4 mg by mouth 2 (two) times daily as needed for muscle spasms., Disp: , Rfl:  ROS  Constitutional: Denies any fever or chills Gastrointestinal: No reported hemesis, hematochezia, vomiting, or acute GI distress Musculoskeletal: Denies any acute onset joint swelling, redness, loss of ROM, or weakness Neurological: No reported episodes of acute onset apraxia, aphasia, dysarthria, agnosia, amnesia, paralysis, loss of coordination, or loss of consciousness  Allergies  Sabrina Green is allergic to penicillins.  PFSH  Drug: Sabrina Green  reports no history of drug use. Alcohol:  reports no history of alcohol use. Tobacco:  reports that she has never smoked. She has never used smokeless tobacco. Medical:  has a past medical history of Arthritis, Hypertension, and Rheumatoid arthritis ([place]). Surgical: Sabrina Green  has a past surgical history that includes Tonsillectomy; Cesarean section; Tubal ligation; Abdominal hysterectomy; Knee arthroscopy; Cholecystectomy; Abdominal hysterectomy; Ankle fracture surgery (Right, 1989); and Eye surgery (Bilateral, 2015). Family: family history includes Breast cancer (age of onset: 8) in an other family member; Hypertension in her mother; Migraines in her mother; Other in her father.  Constitutional Exam  General appearance: Well nourished, well developed, and well hydrated. In no apparent acute distress Vitals:   09/07/18 1112  Pulse: (!) 56  Resp: 18  Temp: 98.4 F (36.9 C)  TempSrc: Oral  SpO2: 100%  Weight: 245 lb (111.1 kg)  Height: 5' 5"  (1.651 m)   BMI Assessment: Estimated body mass index is 40.77 kg/m as calculated from the following:   Height as of this encounter: 5' 5"  (1.651 m).   Weight as of this encounter: 245 lb (111.1 kg).  BMI interpretation table: BMI level Category Range association with higher incidence of chronic pain  <18 kg/m2 Underweight    18.5-24.9 kg/m2 Ideal body weight   25-29.9 kg/m2 Overweight Increased incidence by 20%  30-34.9 kg/m2 Obese (Class I) Increased incidence by 68%  35-39.9 kg/m2 Severe obesity (Class II) Increased incidence by 136%  >40 kg/m2 Extreme obesity (Class III) Increased incidence by 254%   Patient's current BMI Ideal Body weight  Body mass index is 40.77 kg/m. Ideal body weight: 57 kg (125 lb 10.6 oz) Adjusted ideal body weight: 78.7 kg (173 lb 6.4 oz)   BMI Readings from Last 4 Encounters:  09/07/18 40.77 kg/m  08/20/18 39.94 kg/m  08/06/18 40.77 kg/m  07/14/18 47.85 kg/m   Wt Readings from Last 4 Encounters:  09/07/18 245 lb (111.1 kg)  08/20/18 240 lb (108.9 kg)  08/06/18 245 lb (111.1 kg)  07/14/18 245 lb (111.1 kg)  Psych/Mental status: Alert, oriented x 3 (person, place, & time)       Eyes: PERLA Respiratory: No evidence of acute respiratory distress  Cervical Spine Area Exam  Skin & Axial Inspection: No masses, redness, edema, swelling, or associated skin lesions Alignment: Symmetrical Functional ROM: Unrestricted ROM      Stability: No instability detected Muscle Tone/Strength: Functionally intact. No obvious neuro-muscular anomalies detected. Sensory (Neurological): Unimpaired Palpation: No palpable anomalies              Upper Extremity (UE) Exam    Side: Right upper extremity  Side: Left upper extremity  Skin & Extremity Inspection: Skin color, temperature, and hair growth are WNL. No peripheral edema or cyanosis. No masses, redness, swelling, asymmetry, or associated skin lesions. No contractures.  Skin & Extremity Inspection: Skin color, temperature, and hair growth are WNL. No peripheral edema or cyanosis. No masses, redness, swelling, asymmetry, or associated skin lesions. No contractures.  Functional ROM: Decreased ROM for shoulder  Functional ROM: Unrestricted ROM  Muscle Tone/Strength: Functionally intact. No obvious neuro-muscular anomalies detected.   Muscle Tone/Strength: Functionally intact. No obvious neuro-muscular anomalies detected.  Sensory (Neurological): Articular pain pattern          Sensory (Neurological): Unimpaired          Palpation: No palpable anomalies              Palpation: No palpable anomalies              Provocative Test(s):  Phalen's test: deferred Tinel's test: deferred Apley's scratch test (touch opposite shoulder):  Action 1 (Across chest): deferred Action 2 (Overhead): deferred Action 3 (LB reach): deferred   Provocative Test(s):  Phalen's test: deferred Tinel's test: deferred Apley's scratch test (touch opposite shoulder):  Action 1 (Across chest): deferred Action 2 (Overhead): deferred Action 3 (LB reach): deferred    Thoracic Spine Area Exam  Skin & Axial Inspection: No masses, redness, or swelling Alignment: Symmetrical Functional ROM: Unrestricted ROM Stability: No instability detected Muscle Tone/Strength: Functionally intact. No obvious neuro-muscular anomalies detected. Sensory (Neurological): Unimpaired Muscle strength & Tone: No palpable anomalies  Lumbar Spine Area Exam  Skin & Axial Inspection: No masses, redness, or swelling Alignment: Symmetrical Functional ROM: Unrestricted ROM       Stability: No instability detected Muscle Tone/Strength: Functionally intact. No obvious neuro-muscular anomalies detected. Sensory (Neurological): Unimpaired Palpation: No palpable anomalies       Provocative Tests: Hyperextension/rotation test: deferred today       Lumbar quadrant test (Kemp's test): deferred today       Lateral bending test: deferred today       Patrick's Maneuver: deferred today                   FABER* test: deferred today                   S-I anterior distraction/compression test: deferred today         S-I lateral compression test: deferred today         S-I Thigh-thrust test: deferred today         S-I Gaenslen's test: deferred today         *(Flexion, ABduction and  External Rotation)  Gait & Posture Assessment  Ambulation: Unassisted Gait: Relatively normal for age and body habitus Posture: Difficulty standing up straight, due to pain   Lower Extremity Exam    Side: Right lower extremity  Side: Left lower extremity  Stability: No instability observed          Stability: No instability observed          Skin & Extremity Inspection: Skin color, temperature, and hair growth are WNL. No peripheral edema or cyanosis. No masses, redness, swelling, asymmetry, or associated skin lesions. No contractures.  Skin & Extremity Inspection: Skin color, temperature, and hair growth are WNL. No peripheral edema or cyanosis. No masses, redness, swelling, asymmetry, or associated skin lesions. No contractures.  Functional ROM: Decreased ROM for hip and knee joints          Functional ROM: Decreased ROM for hip and knee joints          Muscle Tone/Strength: Functionally intact. No obvious neuro-muscular anomalies detected.  Muscle Tone/Strength: Functionally intact. No obvious neuro-muscular anomalies detected.  Sensory (Neurological): Unimpaired        Sensory (Neurological): Unimpaired        DTR: Patellar: deferred today Achilles: deferred today Plantar: deferred today  DTR: Patellar: deferred today Achilles:  deferred today Plantar: deferred today  Palpation: No palpable anomalies  Palpation: No palpable anomalies   Assessment   Status Diagnosis  Recurring Controlled Controlled 1. Chronic knee pain (Primary Area of Pain) (Bilateral) (L>R)   2. Chronic low back pain (Secondary Area of Pain) (Bilateral) (L>R)   3. Chronic lower extremity pain (Tertiary Area of Pain) (Left)   4. Chronic shoulder pain (Fourth Area of Pain) (Right)      Updated Problems: No problems updated.  Plan of Care  Pharmacotherapy (Medications Ordered): No orders of the defined types were placed in this encounter.  Medications administered today: Sabrina Green had no medications  administered during this visit.   Procedure Orders     GENICULAR NERVE BLOCK Lab Orders  No laboratory test(s) ordered today   Imaging Orders  No imaging studies ordered today    Referral Orders     Ambulatory referral to Physical Therapy Interventional management options: Planned, scheduled, and/or pending:   NOTE: NO RFA until BMI <35. None at this time.  However, the patient indicates that she has been experiencing an increase in the knee pain, despite the fact that we recently finished a series of bilateral intra-articular Hyalgan knee injections.  Typically when the patient has began to experience pain this soon after the treatment, it certainly means that they have lost a significant amount of cartilage and therefore the next step would be a knee replacement.  The patient was already informed that she needed a knee replacement and in fact she was given a date for the surgery.  Apparently she is trying to avoid it.  Today I was very clear with her that although I may be able to help her with the pain, I cannot help any mechanical instability.  The only way to fix that is through the joint replacement.  She understood and accepted.  As a possible course of action, I have offered the patient a diagnostic bilateral genicular nerve block under fluoroscopic guidance and IV sedation.  Should she get good benefit from this procedure, but no long-term benefit, then she may be a good candidate for radiofrequency ablation.  Today we also talked about losing weight, which is a topic that she keeps circling around.  The fact of the matter is that she would improve significantly in terms of her low back pain and bilateral knee pain, if she could bring her BMI to 30 or below.  However, at this point, she has already had significant damage to the joints and despite losing the weight she may still need a replacement.   Considering:   Diagnostic  bilateral glenohumeral Joint (shoulder), AC joint, &  subdeltoid bursa  Injection #2  Diagnostic bilateral intra-articular knee injection Diagnostic bilateral Hyalgan series Diagnosticbilateral genicular nerve block #1 Possible bilateral genicular nerve RFA Diagnosticleft side LESI Diagnosticbilateral lumbar facet nerve block #2  Diagnostic bilateral lumbar facet RFA Diagnostic right shoulder intra-articular joint injection Diagnosticright suprascapular nerve block Possible right suprascapular RFA   Palliative PRN treatment(s):   Diagnostic bilateral genicular nerve block #1 under fluoroscopic guidance and IV sedation  Therapeutic bilateral Hyalgan knee injections 2nd Series(under fluoro) (last done 08/06/2018) Palliative bilateral lumbar facet block    Provider-requested follow-up: Return for PRN Procedure: (B) Genicular NB #1.  No future appointments. Primary Care Physician: Alanson Aly, FNP Location: [facility] Note by: Gaspar Cola, MD Date: 09/07/2018; Time: 12:27 PM

## 2018-09-07 NOTE — Patient Instructions (Signed)
____________________________________________________________________________________________  Preparing for Procedure with Sedation  Instructions: . Oral Intake: Do not eat or drink anything for at least 8 hours prior to your procedure. . Transportation: Public transportation is not allowed. Bring an adult driver. The driver must be physically present in our waiting room before any procedure can be started. . Physical Assistance: Bring an adult physically capable of assisting you, in the event you need help. This adult should keep you company at home for at least 6 hours after the procedure. . Blood Pressure Medicine: Take your blood pressure medicine with a sip of water the morning of the procedure. . Blood thinners: Notify our staff if you are taking any blood thinners. Depending on which one you take, there will be specific instructions on how and when to stop it. . Diabetics on insulin: Notify the staff so that you can be scheduled 1st case in the morning. If your diabetes requires high dose insulin, take only  of your normal insulin dose the morning of the procedure and notify the staff that you have done so. . Preventing infections: Shower with an antibacterial soap the morning of your procedure. . Build-up your immune system: Take 1000 mg of Vitamin C with every meal (3 times a day) the day prior to your procedure. . Antibiotics: Inform the staff if you have a condition or reason that requires you to take antibiotics before dental procedures. . Pregnancy: If you are pregnant, call and cancel the procedure. . Sickness: If you have a cold, fever, or any active infections, call and cancel the procedure. . Arrival: You must be in the facility at least 30 minutes prior to your scheduled procedure. . Children: Do not bring children with you. . Dress appropriately: Bring dark clothing that you would not mind if they get stained. . Valuables: Do not bring any jewelry or valuables.  Procedure  appointments are reserved for interventional treatments only. . No Prescription Refills. . No medication changes will be discussed during procedure appointments. . No disability issues will be discussed.  Reasons to call and reschedule or cancel your procedure: (Following these recommendations will minimize the risk of a serious complication.) . Surgeries: Avoid having procedures within 2 weeks of any surgery. (Avoid for 2 weeks before or after any surgery). . Flu Shots: Avoid having procedures within 2 weeks of a flu shots or . (Avoid for 2 weeks before or after immunizations). . Barium: Avoid having a procedure within 7-10 days after having had a radiological study involving the use of radiological contrast. (Myelograms, Barium swallow or enema study). . Heart attacks: Avoid any elective procedures or surgeries for the initial 6 months after a "Myocardial Infarction" (Heart Attack). . Blood thinners: It is imperative that you stop these medications before procedures. Let us know if you if you take any blood thinner.  . Infection: Avoid procedures during or within two weeks of an infection (including chest colds or gastrointestinal problems). Symptoms associated with infections include: Localized redness, fever, chills, night sweats or profuse sweating, burning sensation when voiding, cough, congestion, stuffiness, runny nose, sore throat, diarrhea, nausea, vomiting, cold or Flu symptoms, recent or current infections. It is specially important if the infection is over the area that we intend to treat. . Heart and lung problems: Symptoms that may suggest an active cardiopulmonary problem include: cough, chest pain, breathing difficulties or shortness of breath, dizziness, ankle swelling, uncontrolled high or unusually low blood pressure, and/or palpitations. If you are experiencing any of these symptoms, cancel   your procedure and contact your primary care physician for an evaluation.  Remember:   Regular Business hours are:  Monday to Thursday 8:00 AM to 4:00 PM  Provider's Schedule: Milinda Pointer, MD:  Procedure days: Tuesday and Thursday 7:30 AM to 4:00 PM  Gillis Santa, MD:  Procedure days: Monday and Wednesday 7:30 AM to 4:00 PM ____________________________________________________________________________________________   ____________________________________________________________________________________________  Weight Management Required  URGENT: Your weight has been found to be adversely affecting your health.  Dear Ms. Sabrina Green:  Your current Body mass index is 40.77 kg/m.Marland Kitchen Estimated body mass index is 40.77 kg/m as calculated from the following:   Height as of this encounter: 5\' 5"  (1.651 m).   Weight as of this encounter: 245 lb (111.1 kg).  Your last four (4) weight and BMI calculations are as follows: Wt Readings from Last 4 Encounters:  09/07/18 245 lb (111.1 kg)  08/20/18 240 lb (108.9 kg)  08/06/18 245 lb (111.1 kg)  07/14/18 245 lb (111.1 kg)   BMI Readings from Last 4 Encounters:  09/07/18 40.77 kg/m  08/20/18 39.94 kg/m  08/06/18 40.77 kg/m  07/14/18 47.85 kg/m    Calculations estimate your ideal body weight to be: Ideal body weight: 57 kg (125 lb 10.6 oz) Adjusted ideal body weight: 78.7 kg (173 lb 6.4 oz)  Please use the table below to identify your weight category and associated incidence of chronic pain, secondary to your weight.  BMI interpretation table: BMI level Category Associated incidence of chronic pain  <18 kg/m2 Underweight   18.5-24.9 kg/m2 Ideal body weight   25-29.9 kg/m2 Overweight  20%  30-34.9 kg/m2 Obese (Class I)  68%  35-39.9 kg/m2 Severe obesity (Class II)  136%  >40 kg/m2 Extreme obesity (Class III)  254%   In addition: You will be considered "Morbidly Obese", if your BMI is above 30 and you have one or more of the following conditions which are known to be directly associated with obesity: 1. Type 2  Diabetes (Which in turn can lead to cardiovascular diseases (CVD), stroke, peripheral vascular diseases (PVD), retinopathy, nephropathy, and neuropathy) 2. Cardiovascular Disease (High Blood Pressure; Congestive Heart Failure; High Cholesterol; Coronary Artery Disease; Angina; or History of Heart Attacks) 3. Breathing problems (Asthma; obesity-hypoventilation syndrome; obstructive sleep apnea; chronic inflammatory airway disease; reactive airway disease; or shortness of breath) 4. Chronic kidney disease 5. Liver disease (nonalcoholic fatty liver disease) 6. High blood pressure 7. Acid reflux (gastroesophageal reflux disease; heartburn) 8. Osteoarthritis (OA) (with any of the following: hip pain; knee pain; and/or low back pain) 9. Low back pain (Lumbar Facet Syndrome; and/or Degenerative Disc Disease) 10. Hip pain (Osteoarthritis of hip) (For every 1 lbs of added body weight, there is a 2 lbs increase in pressure inside of each hip articulation. 1:2 mechanical relationship) 11. Knee pain (Osteoarthritis of knee) (For every 1 lbs of added body weight, there is a 4 lbs increase in pressure inside of each knee articulation. 1:4 mechanical relationship) (patients with a BMI>30 kg/m2 were 6.8 times more likely to develop knee OA than normal-weight individuals) 12. Certain types of cancer. (Epidemiological studies have shown that obesity is a risk factor for: post-menopausal breast cancer; cancers of the endometrium, colon and kidney cancer; malignant adenomas of the oesophagus. Obese subjects have an approximately 1.5-3.5-fold increased risk of developing these cancers compared with normal-weight subjects, and it has been estimated that between 15 and 45% of these cancers can be attributed to overweight. More recent studies suggest that obesity may also increase  the risk of other types of cancer, including pancreatic, hepatic and gallbladder cancer. Ref: Obesity and cancer. Pischon T, Nthlings U, [employer] H.  Proc Nutr Soc. 2008 May;67(2):128-45. doi: 53.9767/H4193790240973532.)  Recommendation: At this point it is urgent that you take a step back and concentrate in loosing weight. Dedicate 100% of your efforts on this task. Nothing else will improve your health more than bringing down your BMI to less than 30. Because most chronic pain patients do have difficulty exercising secondary to their pain, you must rely on proper nutrition and dieting in order to lose the weight. If your BMI is above 40, you should seriously consider bariatric surgery. A realistic goal is to lose 10% of your body weight over a period of 12 months.  If over time you have unsuccessfully try to lose weight, then it is time for you to seek professional help and to enter a medically supervised weight management program.  Pain management considerations:  1. Pharmacological Problems: Be advised that the use of opioid analgesics (oxycodone; hydrocodone; morphine; methadone; codeine; and all of their derivatives) have been associated with decreased metabolism and weight gain.  For this reason, should we see that you are unable to lose weight while taking these medications, it may become necessary for [place] to taper down and indefinitely discontinue them.  2. Technical Problems: The incidence of successful interventional therapies decreases as the patient's BMI increases. It is much more difficult to accomplish a safe and effective interventional therapy on a patient with a BMI above 35. Yours is Body mass index is 40.77 kg/m.Marland Kitchen  3. Radiation Exposure Problems: The x-rays machine, used to accomplish injection therapies, will automatically increase their x-ray output in order to capture an appropriate bone image. This means that radiation exposure increases exponentially with the patient's BMI. (The higher the BMI, the higher the radiation exposure.) Although the level of radiation used at a given time is still safe to the patient, it is not for the  physician and/or assisting staff. Unfortunately, radiation exposure is accumulative. Because physicians and the staff have to do procedures and be exposed on a daily basis, this can result in health problems such as cancer and radiation burns. Radiation exposure to the staff is monitored by the radiation batches that they wear. The exposure levels are reported back to the staff on a quarterly basis. Depending on levels of exposure, physicians and staff may be obligated by law to decrease this exposure. This means that they have the right and obligation to refuse providing therapies where they may be overexposed to radiation. For this reason, physicians may decline to offer therapies such as radiofrequency ablation or implants to patients with a BMI above 40. 4. Current Trends: Be advised that the current trend is to no longer offer certain therapies to patients with a BMI equal to, or above 35, due to increase perioperative risks, increased technical procedural difficulties, and excessive radiation exposure to healthcare personnel. ____________________________________________________________________________________________

## 2018-09-07 NOTE — Progress Notes (Signed)
Safety precautions to be maintained throughout the outpatient stay will include: orient to surroundings, keep bed in low position, maintain call bell within reach at all times, provide assistance with transfer out of bed and ambulation.  

## 2018-09-15 ENCOUNTER — Ambulatory Visit: Payer: Medicare Other | Admitting: Physical Therapy

## 2018-09-17 ENCOUNTER — Other Ambulatory Visit: Payer: Self-pay | Admitting: Family Medicine

## 2018-09-17 DIAGNOSIS — Z1231 Encounter for screening mammogram for malignant neoplasm of breast: Secondary | ICD-10-CM

## 2018-09-24 ENCOUNTER — Encounter: Payer: Self-pay | Admitting: Physical Therapy

## 2018-09-24 ENCOUNTER — Ambulatory Visit: Payer: Medicare Other | Attending: Family Medicine | Admitting: Physical Therapy

## 2018-09-24 DIAGNOSIS — M25511 Pain in right shoulder: Secondary | ICD-10-CM | POA: Diagnosis not present

## 2018-09-24 DIAGNOSIS — M25611 Stiffness of right shoulder, not elsewhere classified: Secondary | ICD-10-CM | POA: Insufficient documentation

## 2018-09-24 DIAGNOSIS — M25612 Stiffness of left shoulder, not elsewhere classified: Secondary | ICD-10-CM | POA: Diagnosis present

## 2018-09-24 DIAGNOSIS — M6281 Muscle weakness (generalized): Secondary | ICD-10-CM | POA: Diagnosis present

## 2018-09-24 DIAGNOSIS — G8929 Other chronic pain: Secondary | ICD-10-CM | POA: Insufficient documentation

## 2018-09-24 DIAGNOSIS — M25512 Pain in left shoulder: Secondary | ICD-10-CM | POA: Insufficient documentation

## 2018-09-24 NOTE — Patient Instructions (Signed)
Access Code: [email or web address]  URL: [email or web address]  Date: 09/24/2018  Prepared by: Dorcas Carrow   Exercises  Doorway Pec Stretch at 60 Elevation - 3 reps - 2 sets - 30 hold - 1x daily - 7x weekly  Circular Shoulder Pendulum with Table Support - 5 reps - 1 sets - 2x daily - 7x weekly  Seated Scapular Retraction - 10 reps - 2 sets - 1x daily - 7x weekly

## 2018-09-25 NOTE — Therapy (Addendum)
[facility] [address] [place], [place], [address] Phone: [phone]   Fax:  [phone]  Physical Therapy Evaluation  Patient Details  Name: Sabrina Green MRN: [id number] Date of Birth: [birth year] Referring Provider (PT): Dr. Milinda Pointer   Encounter Date: 09/24/2018  PT End of Session - 09/24/18 1028    Visit Number  1    Number of Visits  9    Date for PT Re-Evaluation  10/22/18    PT Start Time  1029    PT Stop Time  1141    PT Time Calculation (min)  72 min    Activity Tolerance  Patient tolerated treatment well;Patient limited by pain    Behavior During Therapy  [facility] for tasks assessed/performed       Past Medical History:  Diagnosis Date  . Arthritis   . Hypertension   . Rheumatoid arthritis [facility])     Past Surgical History:  Procedure Laterality Date  . ABDOMINAL HYSTERECTOMY    . ABDOMINAL HYSTERECTOMY    . [place], [place]  . CESAREAN SECTION    . CHOLECYSTECTOMY    . EYE SURGERY Bilateral 2015   cataract surgery, [place], [place]  . KNEE ARTHROSCOPY    . TONSILLECTOMY    . TUBAL LIGATION      There were no vitals filed for this visit.   Subjective Assessment - 09/24/18 1028    Subjective  Pt. recently went to pain management MD who referred her to therapy for chronic B shld pain. Pt. reports R shld pain started 3 years and was diganosed with bursitis and OA. Pt. reports L shld pain has been going on for 2 year in which she had a rotator cuff tear but never had surgery. Pt. states she went to PT for L shld and it "got better" Pt. states R shld bothers her more due to dominant side.  Pt. reports overall joint stiffness and coldness in fingers occasionally. Pt. reports wrist pain when using computer and trouble with opening jars. Pt. is sleeping better at night following B shld injection earlier this month. Pt. denies N/T, HAs.  Aggravating factors  include abduction, reaching behind in back seat, overhead reaching. Easing factors include resting, and moving shlds in circular motion. Pt. Is retired and lives an overall sedentary lifestyle with no regular exercise besides walking occasionally.   Pertinent History  Previous PT for L rotator cuff repair. Chronic LBP, high blood pressure, asthma, RA, B chronic knee pain (PT treatment). Pt. is being treated for fibromyalgia and pt. states doctor has informed her that she has an autoimmune disease but they do not know what it is.     Limitations  House hold activities;Writing    Patient Stated Goals  decr. pain and "free up" shoulder    Currently in Pain?  No/denies    Pain Score  0-No pain    Pain Location  Shoulder    Pain Orientation  Left;Right    Pain Descriptors / Indicators  Sharp;Dull    Pain Frequency  Intermittent    Pain Relieving Factors  heat is sometimes helpful         [facility] PT Assessment - 09/24/18 0001      Assessment   Medical Diagnosis  Chronic R shld pain    Referring Provider (PT)  Dr. Milinda Pointer    Hand Dominance  Right    Prior Therapy  Yes  for L shld many years ag      Balance Screen   Has the patient fallen in the past 6 months  No      [place] residence      Prior Function   Level of Independence  Independent      Cognition   Overall Cognitive Status  Within Functional Limits for tasks assessed         AROM (degrees):  Supine position Flexion: R 168 deg. / L 158 deg.   Abduction: R 130 deg. / L 144 deg.  IR: R 72 deg. / L 70 deg.  ER: R 76 deg. / L 56 deg.  Behind head: WFL Behind back: [facility]  Cervical: WFL Thoracic: WFL  Elbow: WFL (incr. Carrying angle noted)   MMT: Flexion: R 4 / L 4   Abduction: R 3+ / L 4   Biceps: R 4 /  L 4+   Triceps: R 4 / L 4+  ER: R 4 /  L 5  IR: R 4 / L 5 Wrist Flexors: R 5 / L 5 Wrist extensors: R 5 / L 5  Grip: R 37# (wrist pain) / L 38#  Special  Tests: Hawkins-Kennedy: R positive L negative Empty Can: R negative L negative Neers: R negative L negative  Palpation: Tender on R/L infraspinatous and teres minor R upper trap trigger point noted  PAM: R Shld: decr pain with distraction, incr pain with grade II AP mobs which improved with duration L Shld: more hypomobility noted in L, decr. Pain with distraction, incr. Pain with grade II AP mobs which improved with duration    Reviewed HEP  Objective measurements completed on examination: See above findings.     Plan - 09/24/18 1155    Clinical Impression Statement  Pt. is a plesant retired 73 y/o female who presents to clinic with chronic B shoulder pain (R > L). Pt. ambulates into clinic with wide BOS, decreased knee flexion and short stride length 2/2 B knee OA. Pt. demonstrates a significant forward head posture and rounded shoulders. Pt. is TTP on R/L infraspinatus and teres minor. AROM in supine position: Flexion: R 168 deg. / L 158 deg. Abduction: R 130 deg. / L 144 deg. IR: R 72 deg. / L 70 deg. ER: R 76 deg. / L 56 deg. Pt. demonstrates weakness in B shoulders (R > L); MMT: Flexion: R 4 / L 4 Abduction: R 3+ / L 4 Biceps: R 4 / L 4+ Triceps: R 4 / L 4+ ER: R 4 / L 5 IR: R 4 / L 5, Grip R 37# / L 38#. Pt. Has positive R Hawkins-Kennedy test. Pt. Reported decr. Pain with shld distraction both R/L. Pt. Had incr. Pain with B grade II AP mobilizations but improved with duration; more hypomobility noted in L shld joint by PT. Initial FOTO: 55, goal 62. Pt. Was prescribed independent HEP to promote incr. Mobility in shoulder and scapular strengthening. Pt. Will benefit from skilled PT services in order to improve B shld ROM/ strength/ endurance in order to be able to complete pain-free ADLs and promote upright posture.   Clinical Presentation  Evolving    Clinical Decision Making  Moderate    Rehab Potential  Fair    Clinical Impairments Affecting Rehab Potential  (-) OA, RA,  signficant medical hx, chronic pain (+) family support, motivation    PT Frequency  2x / week  PT Duration  4 weeks    PT Treatment/Interventions  ADLs/Self Care Home Management;Cryotherapy;Electrical Stimulation;Moist Heat;Functional mobility training;Therapeutic activities;Therapeutic exercise;Neuromuscular re-education;Manual techniques;Patient/family education;Passive range of motion;Dry needling;Energy conservation    PT Next Visit Plan  assess scapular mobility, continue with mobilizations and PROM, postural strengthening    PT Home Exercise Plan  see handouts    Consulted and Agree with Plan of Care  Patient       Patient will benefit from skilled therapeutic intervention in order to improve the following deficits and impairments:  Improper body mechanics, Pain, Postural dysfunction, Decreased mobility, Decreased activity tolerance, Decreased endurance, Decreased range of motion, Decreased strength, Hypomobility, Obesity  Visit Diagnosis: Chronic right shoulder pain  Chronic left shoulder pain  Joint stiffness of both shoulders  Muscle weakness     Problem List Patient Active Problem List   Diagnosis Date Noted  . Neurogenic pain 03/02/2018  . Tricompartmental disease of knee (Bilateral) 02/04/2018  . Chronic low back pain (Secondary Area of Pain) (Bilateral) (L>R) 02/04/2018  . Lumbar facet syndrome (Bilateral) (L>R) 02/04/2018  . Spondylosis without myelopathy or radiculopathy, lumbosacral region 02/04/2018  . History of claustrophobia 01/05/2018  . History of panic attacks 01/05/2018  . Chronic shoulder pain (Bilateral) 01/05/2018  . Osteoarthritis of shoulder (Bilateral) 01/05/2018  . Osteoarthritis of knees (Bilateral) 01/05/2018  . Elevated C-reactive protein (CRP) 12/15/2017  . Chronic pain syndrome 12/11/2017  . Pharmacologic therapy 12/11/2017  . Disorder of skeletal system 12/11/2017  . Problems influencing health status 12/11/2017  . Chronic knee pain  (Primary Area of Pain) (Bilateral) (L>R) 12/11/2017  . Chronic lower extremity pain [facility] Area of Pain) (Left) 12/11/2017  . Chronic shoulder pain (Fourth Area of Pain) (Right) 12/11/2017  . Chronic hip pain (Left) 12/11/2017  . Fatty liver disease, nonalcoholic 73/42/8768  . DDD (degenerative disc disease), lumbar 06/02/2017  . Chronic low back pain ([place]) 04/22/2017  . CKD (chronic kidney disease) stage 3, GFR 30-59 ml/min (HCC) 03/28/2017  . BMI 40.0-44.9, adult ([place]) 03/24/2017  . Elevated erythrocyte sedimentation rate 02/12/2017  . Osteoarthritis of knee (Left) 12/26/2016  . Shoulder bursitis (Right) 06/06/2016  . Encounter for long-term (current) use of high-risk medication 03/06/2016  . Long term current use of opiate analgesic 03/06/2016  . Chronic gout 02/26/2016  . Essential hypertension 02/26/2016  . Exercise-induced asthma 02/26/2016  . Fibromyalgia 02/26/2016  . GERD without esophagitis 02/26/2016  . IBS (irritable bowel syndrome) 02/26/2016  . Rheumatoid arthritis involving multiple sites ([place]) 02/26/2016  . Vitamin B12 deficiency 02/26/2016  . Vitamin D deficiency 02/26/2016   Pura Spice, PT, DPT # [id number] [place], [place] 09/25/2018, 7:53 AM  [facility] [address] [place], [place], [address] Phone: [phone]   Fax:  [phone]  Name: Mekayla Soman MRN: [id number] Date of Birth: [birth year]

## 2018-10-02 ENCOUNTER — Encounter: Payer: Self-pay | Admitting: Physical Therapy

## 2018-10-02 ENCOUNTER — Ambulatory Visit: Payer: Medicare Other | Attending: Family Medicine | Admitting: Physical Therapy

## 2018-10-02 DIAGNOSIS — M25511 Pain in right shoulder: Secondary | ICD-10-CM | POA: Insufficient documentation

## 2018-10-02 DIAGNOSIS — M25612 Stiffness of left shoulder, not elsewhere classified: Secondary | ICD-10-CM | POA: Diagnosis present

## 2018-10-02 DIAGNOSIS — M25512 Pain in left shoulder: Secondary | ICD-10-CM | POA: Diagnosis present

## 2018-10-02 DIAGNOSIS — M6281 Muscle weakness (generalized): Secondary | ICD-10-CM | POA: Diagnosis present

## 2018-10-02 DIAGNOSIS — M25611 Stiffness of right shoulder, not elsewhere classified: Secondary | ICD-10-CM | POA: Insufficient documentation

## 2018-10-02 DIAGNOSIS — G8929 Other chronic pain: Secondary | ICD-10-CM | POA: Diagnosis present

## 2018-10-02 NOTE — Therapy (Signed)
[facility] [address] [place], [place], [address] Phone: [phone]   Fax:  [phone]  Physical Therapy Treatment  Patient Details  Name: Sabrina Green MRN: [id number] Date of Birth: 1946/03/07 Referring Provider (PT): Dr. Milinda Pointer   Encounter Date: 10/02/2018  PT End of Session - 10/02/18 0935    Visit Number  2    Number of Visits  9    Date for PT Re-Evaluation  10/22/18    PT Start Time  0813    PT Stop Time  0901    PT Time Calculation (min)  48 min    Activity Tolerance  Patient tolerated treatment well    Behavior During Therapy  [facility] for tasks assessed/performed       Past Medical History:  Diagnosis Date  . Arthritis   . Hypertension   . Rheumatoid arthritis [facility])     Past Surgical History:  Procedure Laterality Date  . ABDOMINAL HYSTERECTOMY    . ABDOMINAL HYSTERECTOMY    . [place], [place]  . CESAREAN SECTION    . CHOLECYSTECTOMY    . EYE SURGERY Bilateral 2015   cataract surgery, [place], [place]  . KNEE ARTHROSCOPY    . TONSILLECTOMY    . TUBAL LIGATION      There were no vitals filed for this visit.  Subjective Assessment - 10/02/18 0934    Subjective  Pt. arrived to PT 30 minutes early and reports HEP is going well and helping.  Pt. reports no pain in R shoulder currently at rest.      Pertinent History  Previous PT for L rotator cuff repair. Chronic LBP, high blood pressure, asthma, RA, B chronic knee pain (PT treatment). Pt. is being treated for fibromyalgia and pt. states doctor has informed her that she has an autoimmune disease but they do not know what it is.     Limitations  House hold activities;Writing    Patient Stated Goals  decr. pain and "free up" shoulder    Currently in Pain?  No/denies        Treatment:  There.ex.:  Seated B UBE 2 min. F/b (assist required to get on/off UBE safely) Reviewed  HEP Seated/supine B shoulder AROM all planes 5x each. Supine wand: press-ups/ shoulder flexion 20x. Supine 2# dumbbell: press-ups/ tricep extension 20x each. See new HEP (RTB scap. Retraction/ sh. Extension/ ER)  Manual tx.:  Supine B shoulder AA/PROM 5x each (as tolerated)- no increase c/o pain Supine R grade II-III AP/inf. Mobs. 3x30 sec. Each STM to R UT/ deltoid/ prox. Bicep musculature  Ice to R shoulder in seated posture after tx.  PT discussed cause/treatment of bursitis.    PT Education - 10/02/18 0935    Education Details  See HEP/ issued RTB    Person(s) Educated  Patient    Methods  Explanation;Demonstration;Handout    Comprehension  Verbalized understanding;Returned demonstration          PT Long Term Goals - 09/24/18 1341      PT LONG TERM GOAL #1   Title  Pt. will increase FOTO score to >62 to improve pain-free functional mobility and ADLs.    Baseline  Initial FOTO: 55    Time  4    Period  Weeks    Status  New    Target Date  10/22/18      PT LONG TERM GOAL #2   Title  Pt. will improve B UE MMT by 1/2 grade in order to improve pain-free mobility with ADLs.    Baseline  Eval MMT: Flexion: R 4 / L 4  Abduction: R 3+ / L 4  Biceps: R 4 /  L 4+  Triceps: R 4 / L 4+ ER: R 4 /  L 5 IR: R 4 / L 5, Grip R 37# / L 38#    Time  4    Period  Weeks    Status  New    Target Date  10/22/18      PT LONG TERM GOAL #3   Title  Pt. will be able to demonstrate proper seated/ upright posture to improve scapular mobility and promote pain-free functional mobility.    Baseline  Pt. demonstrates a signficant forward head posture with rounded shoulders    Time  4    Period  Weeks    Status  New    Target Date  10/22/18      PT LONG TERM GOAL #4   Title  Pt. will be able to complete HEP independently with 0/10 pain to improve functional mobility and promote overall UE muscle endurance.    Baseline  Pt. was prescribed HEP with scap retractions, pendulums and doorway  stretch on eval.    Time  4    Period  Weeks    Status  New    Target Date  10/22/18            Plan - 10/02/18 0936    Clinical Impression Statement  Pt. presents with good R shoulder AROM in supine/ seated posture.  Slight R shoulder discomfort with sh. abduction.  PT focused on end-range AROM and strengthening/ posture correction ex.  Pt. benefited from mirror feedback with minimal verbal correction required.  Pt. issued RTB for strengthening ex. program.  Increase L shoulder posterior capsular mobility noted today.      Stability/Clinical Decision Making  Evolving/Moderate complexity    Clinical Decision Making  Moderate    Rehab Potential  Fair    Clinical Impairments Affecting Rehab Potential  (-) OA, RA, signficant medical hx, chronic pain (+) family support, motivation    PT Frequency  2x / week    PT Duration  4 weeks    PT Treatment/Interventions  ADLs/Self Care Home Management;Cryotherapy;Electrical Stimulation;Moist Heat;Functional mobility training;Therapeutic activities;Therapeutic exercise;Neuromuscular re-education;Manual techniques;Patient/family education;Passive range of motion;Dry needling;Energy conservation    PT Next Visit Plan  Assess scapular mobility, continue with mobilizations and progress postural/ UE strengthening.    PT Home Exercise Plan  see handouts       Patient will benefit from skilled therapeutic intervention in order to improve the following deficits and impairments:  Improper body mechanics, Pain, Postural dysfunction, Decreased mobility, Decreased activity tolerance, Decreased endurance, Decreased range of motion, Decreased strength, Hypomobility, Obesity  Visit Diagnosis: Chronic right shoulder pain  Chronic left shoulder pain  Joint stiffness of both shoulders  Muscle weakness     Problem List Patient Active Problem List   Diagnosis Date Noted  . Neurogenic pain 03/02/2018  . Tricompartmental disease of knee (Bilateral)  02/04/2018  . Chronic low back pain (Secondary Area of Pain) (Bilateral) (L>R) 02/04/2018  . Lumbar facet syndrome (Bilateral) (L>R) 02/04/2018  . Spondylosis without myelopathy or radiculopathy, lumbosacral region 02/04/2018  . History of claustrophobia 01/05/2018  . History of panic attacks 01/05/2018  . Chronic shoulder pain (Bilateral) 01/05/2018  . Osteoarthritis of shoulder (Bilateral) 01/05/2018  . Osteoarthritis  of knees (Bilateral) 01/05/2018  . Elevated C-reactive protein (CRP) 12/15/2017  . Chronic pain syndrome 12/11/2017  . Pharmacologic therapy 12/11/2017  . Disorder of skeletal system 12/11/2017  . Problems influencing health status 12/11/2017  . Chronic knee pain (Primary Area of Pain) (Bilateral) (L>R) 12/11/2017  . Chronic lower extremity pain [facility] Area of Pain) (Left) 12/11/2017  . Chronic shoulder pain (Fourth Area of Pain) (Right) 12/11/2017  . Chronic hip pain (Left) 12/11/2017  . Fatty liver disease, nonalcoholic 16/04/9603  . DDD (degenerative disc disease), lumbar 06/02/2017  . Chronic low back pain (Midline) 04/22/2017  . CKD (chronic kidney disease) stage 3, GFR 30-59 ml/min (HCC) 03/28/2017  . BMI 40.0-44.9, adult ([place]) 03/24/2017  . Elevated erythrocyte sedimentation rate 02/12/2017  . Osteoarthritis of knee (Left) 12/26/2016  . Shoulder bursitis (Right) 06/06/2016  . Encounter for long-term (current) use of high-risk medication 03/06/2016  . Long term current use of opiate analgesic 03/06/2016  . Chronic gout 02/26/2016  . Essential hypertension 02/26/2016  . Exercise-induced asthma 02/26/2016  . Fibromyalgia 02/26/2016  . GERD without esophagitis 02/26/2016  . IBS (irritable bowel syndrome) 02/26/2016  . Rheumatoid arthritis involving multiple sites ([place]) 02/26/2016  . Vitamin B12 deficiency 02/26/2016  . Vitamin D deficiency 02/26/2016   Pura Spice, PT, DPT # [phone] 10/02/2018, 6:43 PM  [facility] [facility] [address] [place], [place], [address] Phone: [phone]   Fax:  [phone]  Name: Sabrina Green MRN: [id number] Date of Birth: 09/17/1945

## 2018-10-02 NOTE — Patient Instructions (Signed)
Access Code: [email or web address]  URL: [email or web address]  Date: 10/02/2018  Prepared by: Dorcas Carrow   Exercises  Shoulder External Rotation and Scapular Retraction with Resistance - 10 reps - 2 sets - 1x daily - 7x weekly  Scapular Retraction with Resistance - 10 reps - 2 sets - 1x daily - 7x weekly  Shoulder Extension with Resistance - 10 reps - 2 sets - 1x daily - 7x weekly

## 2018-10-06 ENCOUNTER — Ambulatory Visit: Payer: Medicare Other | Admitting: Physical Therapy

## 2018-10-06 ENCOUNTER — Encounter: Payer: Self-pay | Admitting: Physical Therapy

## 2018-10-06 DIAGNOSIS — M25612 Stiffness of left shoulder, not elsewhere classified: Secondary | ICD-10-CM

## 2018-10-06 DIAGNOSIS — M25512 Pain in left shoulder: Secondary | ICD-10-CM

## 2018-10-06 DIAGNOSIS — G8929 Other chronic pain: Secondary | ICD-10-CM

## 2018-10-06 DIAGNOSIS — M25611 Stiffness of right shoulder, not elsewhere classified: Secondary | ICD-10-CM

## 2018-10-06 DIAGNOSIS — M6281 Muscle weakness (generalized): Secondary | ICD-10-CM

## 2018-10-06 DIAGNOSIS — M25511 Pain in right shoulder: Principal | ICD-10-CM

## 2018-10-06 NOTE — Therapy (Signed)
[facility] [address] [place], [place], [address] Phone: [phone]   Fax:  [phone]  Physical Therapy Treatment  Patient Details  Name: Sabrina Green MRN: [id number] Date of Birth: Jan 24, 1946 Referring Provider (PT): Dr. Milinda Pointer   Encounter Date: 10/06/2018  PT End of Session - 10/06/18 1243    Visit Number  3    Number of Visits  9    Date for PT Re-Evaluation  10/22/18    PT Start Time  1115    PT Stop Time  1200    PT Time Calculation (min)  45 min    Activity Tolerance  Patient tolerated treatment well    Behavior During Therapy  [facility] for tasks assessed/performed       Past Medical History:  Diagnosis Date  . Arthritis   . Hypertension   . Rheumatoid arthritis [facility])     Past Surgical History:  Procedure Laterality Date  . ABDOMINAL HYSTERECTOMY    . ABDOMINAL HYSTERECTOMY    . [place], [place]  . CESAREAN SECTION    . CHOLECYSTECTOMY    . EYE SURGERY Bilateral 2015   cataract surgery, [place], [place]  . KNEE ARTHROSCOPY    . TONSILLECTOMY    . TUBAL LIGATION      There were no vitals filed for this visit.  Subjective Assessment - 10/06/18 1241    Subjective  Pt stated that she is very sleepy today and closes her eyes frequently throughout session.  She was interested in HEP and had several questions.  Pt reported pain in R shoulder only today which was alleviated following manual therapy and the ex.    Pertinent History  Previous PT for L rotator cuff repair. Chronic LBP, high blood pressure, asthma, RA, B chronic knee pain (PT treatment). Pt. is being treated for fibromyalgia and pt. states doctor has informed her that she has an autoimmune disease but they do not know what it is.     Limitations  House hold activities;Writing    Patient Stated Goals  decr. pain and "free up" shoulder    Pain Score  2     Pain Location  --   R shoulder       Manual Therapy STM to Bilat deltoid/delt/biceps x10 min, pt in supine A-p shoulder abduction and flexion mobilization with pt in supine 4x40 sec grade 2-3  Therapeutic Exercise Shoulder overhead press 2 lbs x10 shoulder abduction 1 lb x10 Shoulder flexion 1 lb x10 Supine triceps extension 2x10 2 lbs Chest press x10 2 lbs Standing pec stretch 2x60 sec bilaterally  Shoulder flexion with bar x60 sec  Education regarding management of HEP, pain management and sleeping position x4 min                              PT Education - 10/06/18 1243    Education Details  HEP, nature of frozen shoulder and importance of frequent movement    Person(s) Educated  Patient    Methods  Explanation    Comprehension  Verbalized understanding          PT Long Term Goals - 09/24/18 1341      PT LONG TERM GOAL #1   Title  Pt. will increase FOTO score to >62 to improve pain-free functional mobility and ADLs.    Baseline  Initial FOTO: 55  Time  4    Period  Weeks    Status  New    Target Date  10/22/18      PT LONG TERM GOAL #2   Title  Pt. will improve B UE MMT by 1/2 grade in order to improve pain-free mobility with ADLs.    Baseline  Eval MMT: Flexion: R 4 / L 4  Abduction: R 3+ / L 4  Biceps: R 4 /  L 4+  Triceps: R 4 / L 4+ ER: R 4 /  L 5 IR: R 4 / L 5, Grip R 37# / L 38#    Time  4    Period  Weeks    Status  New    Target Date  10/22/18      PT LONG TERM GOAL #3   Title  Pt. will be able to demonstrate proper seated/ upright posture to improve scapular mobility and promote pain-free functional mobility.    Baseline  Pt. demonstrates a signficant forward head posture with rounded shoulders    Time  4    Period  Weeks    Status  New    Target Date  10/22/18      PT LONG TERM GOAL #4   Title  Pt. will be able to complete HEP independently with 0/10 pain to improve functional mobility and promote overall UE muscle endurance.    Baseline  Pt. was  prescribed HEP with scap retractions, pendulums and doorway stretch on eval.    Time  4    Period  Weeks    Status  New    Target Date  10/22/18              Patient will benefit from skilled therapeutic intervention in order to improve the following deficits and impairments:     Visit Diagnosis: Chronic right shoulder pain  Chronic left shoulder pain  Joint stiffness of both shoulders  Muscle weakness     Problem List Patient Active Problem List   Diagnosis Date Noted  . Neurogenic pain 03/02/2018  . Tricompartmental disease of knee (Bilateral) 02/04/2018  . Chronic low back pain (Secondary Area of Pain) (Bilateral) (L>R) 02/04/2018  . Lumbar facet syndrome (Bilateral) (L>R) 02/04/2018  . Spondylosis without myelopathy or radiculopathy, lumbosacral region 02/04/2018  . History of claustrophobia 01/05/2018  . History of panic attacks 01/05/2018  . Chronic shoulder pain (Bilateral) 01/05/2018  . Osteoarthritis of shoulder (Bilateral) 01/05/2018  . Osteoarthritis of knees (Bilateral) 01/05/2018  . Elevated C-reactive protein (CRP) 12/15/2017  . Chronic pain syndrome 12/11/2017  . Pharmacologic therapy 12/11/2017  . Disorder of skeletal system 12/11/2017  . Problems influencing health status 12/11/2017  . Chronic knee pain (Primary Area of Pain) (Bilateral) (L>R) 12/11/2017  . Chronic lower extremity pain [facility] Area of Pain) (Left) 12/11/2017  . Chronic shoulder pain (Fourth Area of Pain) (Right) 12/11/2017  . Chronic hip pain (Left) 12/11/2017  . Fatty liver disease, nonalcoholic 00/93/8182  . DDD (degenerative disc disease), lumbar 06/02/2017  . Chronic low back pain (Midline) 04/22/2017  . CKD (chronic kidney disease) stage 3, GFR 30-59 ml/min (HCC) 03/28/2017  . BMI 40.0-44.9, adult ([place]) 03/24/2017  . Elevated erythrocyte sedimentation rate 02/12/2017  . Osteoarthritis of knee (Left) 12/26/2016  . Shoulder bursitis (Right) 06/06/2016  . Encounter for  long-term (current) use of high-risk medication 03/06/2016  . Long term current use of opiate analgesic 03/06/2016  . Chronic gout 02/26/2016  . Essential hypertension 02/26/2016  .  Exercise-induced asthma 02/26/2016  . Fibromyalgia 02/26/2016  . GERD without esophagitis 02/26/2016  . IBS (irritable bowel syndrome) 02/26/2016  . Rheumatoid arthritis involving multiple sites ([place]) 02/26/2016  . Vitamin B12 deficiency 02/26/2016  . Vitamin D deficiency 02/26/2016   Roxanne Gates, PT, DPT  Roxanne Gates 10/06/2018, 12:45 PM  [facility] [address] [place], [place], [address] Phone: [phone]   Fax:  [phone]  Name: Sabrina Green MRN: [id number] Date of Birth: Nov 30, 1945

## 2018-10-08 ENCOUNTER — Encounter: Payer: Self-pay | Admitting: Physical Therapy

## 2018-10-08 ENCOUNTER — Other Ambulatory Visit: Payer: Self-pay

## 2018-10-08 ENCOUNTER — Ambulatory Visit: Payer: Medicare Other | Admitting: Physical Therapy

## 2018-10-08 DIAGNOSIS — M6281 Muscle weakness (generalized): Secondary | ICD-10-CM

## 2018-10-08 DIAGNOSIS — G8929 Other chronic pain: Secondary | ICD-10-CM

## 2018-10-08 DIAGNOSIS — M25511 Pain in right shoulder: Secondary | ICD-10-CM | POA: Diagnosis not present

## 2018-10-08 DIAGNOSIS — M25512 Pain in left shoulder: Secondary | ICD-10-CM

## 2018-10-08 DIAGNOSIS — M25611 Stiffness of right shoulder, not elsewhere classified: Secondary | ICD-10-CM

## 2018-10-08 DIAGNOSIS — M25612 Stiffness of left shoulder, not elsewhere classified: Secondary | ICD-10-CM

## 2018-10-08 NOTE — Therapy (Signed)
[facility] [address] [place], [place], [address] Phone: [phone]   Fax:  [phone]  Physical Therapy Treatment  Patient Details  Name: Sabrina Green MRN: [id number] Date of Birth: 10-11-1945 Referring Provider (PT): Dr. Milinda Pointer   Encounter Date: 10/08/2018  PT End of Session - 10/08/18 1602    Visit Number  4    Number of Visits  9    Date for PT Re-Evaluation  10/22/18    Authorization - Visit Number  4    Authorization - Number of Visits  10    PT Start Time  1300    PT Stop Time  0175    PT Time Calculation (min)  45 min    Activity Tolerance  Patient tolerated treatment well    Behavior During Therapy  [facility] for tasks assessed/performed       Past Medical History:  Diagnosis Date  . Arthritis   . Hypertension   . Rheumatoid arthritis [facility])     Past Surgical History:  Procedure Laterality Date  . ABDOMINAL HYSTERECTOMY    . ABDOMINAL HYSTERECTOMY    . [place], [place]  . CESAREAN SECTION    . CHOLECYSTECTOMY    . EYE SURGERY Bilateral 2015   cataract surgery, [place]  . KNEE ARTHROSCOPY    . TONSILLECTOMY    . TUBAL LIGATION      There were no vitals filed for this visit.  Subjective Assessment - 10/08/18 1307    Subjective  Pt reports soreness in joint following last treatment which she describes as post work out  pain.  She experienced one instance of  shooting pain in L arm but no other soreness.    Pertinent History  Previous PT for L rotator cuff repair. Chronic LBP, high blood pressure, asthma, RA, B chronic knee pain (PT treatment). Pt. is being treated for fibromyalgia and pt. states doctor has informed her that she has an autoimmune disease but they do not know what it is.     Limitations  House hold activities;Writing    Patient Stated Goals  decr. pain and "free up" shoulder    Pain Score  0-No pain        Therapautic Exercise   Seated AAROM with dowel x30 sec each Shoulder lateral raises 2x10 1 lb Forward shoulder raises 2x10 1 lb Upright rows 2x10 2 lb Biceps curls 2x10 2 lbs (increase wt) Triceps extensions supine 2x10 4 Lb Chest press 2x10 2#   Manual therapy Seated superior-inferior shoulder mobilization grade 2 x60 sec bilaterally Supine superior-inferior glide grade 3 3x40 sec bilaterally STM to deltoids, biceps x2 min Ice pack applied to R shoulder 10 min unattended                         PT Education - 10/08/18 1308    Education Details  Benefit of manual therapy for improved shoulder ROM    Person(s) Educated  Patient    Methods  Explanation    Comprehension  Verbalized understanding          PT Long Term Goals - 09/24/18 1341      PT LONG TERM GOAL #1   Title  Pt. will increase FOTO score to >62 to improve pain-free functional mobility and ADLs.    Baseline  Initial FOTO: 55    Time  4  Period  Weeks    Status  New    Target Date  10/22/18      PT LONG TERM GOAL #2   Title  Pt. will improve B UE MMT by 1/2 grade in order to improve pain-free mobility with ADLs.    Baseline  Eval MMT: Flexion: R 4 / L 4  Abduction: R 3+ / L 4  Biceps: R 4 /  L 4+  Triceps: R 4 / L 4+ ER: R 4 /  L 5 IR: R 4 / L 5, Grip R 37# / L 38#    Time  4    Period  Weeks    Status  New    Target Date  10/22/18      PT LONG TERM GOAL #3   Title  Pt. will be able to demonstrate proper seated/ upright posture to improve scapular mobility and promote pain-free functional mobility.    Baseline  Pt. demonstrates a signficant forward head posture with rounded shoulders    Time  4    Period  Weeks    Status  New    Target Date  10/22/18      PT LONG TERM GOAL #4   Title  Pt. will be able to complete HEP independently with 0/10 pain to improve functional mobility and promote overall UE muscle endurance.    Baseline  Pt. was prescribed HEP with scap  retractions, pendulums and doorway stretch on eval.    Time  4    Period  Weeks    Status  New    Target Date  10/22/18            Plan - 10/08/18 1610    Clinical Impression Statement  Pt able to complete all there ex without pain increase.  Reported feeling a catch in R shoulder with forward shoulder raises but was able to work through a pain free range with good body mechanics with some education.  Fatigued quickly with standing UE shoulder exercises using 1 lb wts.  Pt is ready to increase wt for all UE there ex except shoulder lateral and front raises.  She responded well with an increase in bilateral shoulder abduction of 4-5 degrees following joint mobilization.  Pt reported soreness of shoulders following manual therapy and requested ice.  PT placed ice on pt's shoulder with pt seated in chair which pt stated helped.  Pt will continue to benefit from skilled PT with focus on strength, tolerance to activity, ROM and pain management.    Stability/Clinical Decision Making  Evolving/Moderate complexity    Rehab Potential  Fair    Clinical Impairments Affecting Rehab Potential  (-) OA, RA, signficant medical hx, chronic pain (+) family support, motivation    PT Frequency  2x / week    PT Duration  4 weeks    PT Treatment/Interventions  ADLs/Self Care Home Management;Cryotherapy;Electrical Stimulation;Moist Heat;Functional mobility training;Therapeutic activities;Therapeutic exercise;Neuromuscular re-education;Manual techniques;Patient/family education;Passive range of motion;Dry needling;Energy conservation    PT Next Visit Plan  Assess scapular mobility, continue with mobilizations and progress postural/ UE strengthening.    PT Home Exercise Plan  see handouts       Patient will benefit from skilled therapeutic intervention in order to improve the following deficits and impairments:  Improper body mechanics, Pain, Postural dysfunction, Decreased mobility, Decreased activity tolerance,  Decreased endurance, Decreased range of motion, Decreased strength, Hypomobility, Obesity  Visit Diagnosis: Chronic right shoulder pain  Chronic left shoulder pain  Joint  stiffness of both shoulders  Muscle weakness     Problem List Patient Active Problem List   Diagnosis Date Noted  . Neurogenic pain 03/02/2018  . Tricompartmental disease of knee (Bilateral) 02/04/2018  . Chronic low back pain (Secondary Area of Pain) (Bilateral) (L>R) 02/04/2018  . Lumbar facet syndrome (Bilateral) (L>R) 02/04/2018  . Spondylosis without myelopathy or radiculopathy, lumbosacral region 02/04/2018  . History of claustrophobia 01/05/2018  . History of panic attacks 01/05/2018  . Chronic shoulder pain (Bilateral) 01/05/2018  . Osteoarthritis of shoulder (Bilateral) 01/05/2018  . Osteoarthritis of knees (Bilateral) 01/05/2018  . Elevated C-reactive protein (CRP) 12/15/2017  . Chronic pain syndrome 12/11/2017  . Pharmacologic therapy 12/11/2017  . Disorder of skeletal system 12/11/2017  . Problems influencing health status 12/11/2017  . Chronic knee pain (Primary Area of Pain) (Bilateral) (L>R) 12/11/2017  . Chronic lower extremity pain [facility] Area of Pain) (Left) 12/11/2017  . Chronic shoulder pain (Fourth Area of Pain) (Right) 12/11/2017  . Chronic hip pain (Left) 12/11/2017  . Fatty liver disease, nonalcoholic 95/03/3266  . DDD (degenerative disc disease), lumbar 06/02/2017  . Chronic low back pain (Mid[place]) 04/22/2017  . CKD (chronic kidney disease) stage 3, GFR 30-59 ml/min (HCC) 03/28/2017  . BMI 40.0-44.9, adult ([place]) 03/24/2017  . Elevated erythrocyte sedimentation rate 02/12/2017  . Osteoarthritis of knee (Left) 12/26/2016  . Shoulder bursitis (Right) 06/06/2016  . Encounter for long-term (current) use of high-risk medication 03/06/2016  . Long term current use of opiate analgesic 03/06/2016  . Chronic gout 02/26/2016  . Essential hypertension 02/26/2016  . Exercise-induced  asthma 02/26/2016  . Fibromyalgia 02/26/2016  . GERD without esophagitis 02/26/2016  . IBS (irritable bowel syndrome) 02/26/2016  . Rheumatoid arthritis involving multiple sites ([place]) 02/26/2016  . Vitamin B12 deficiency 02/26/2016  . Vitamin D deficiency 02/26/2016   Roxanne Gates, PT, DPT  Roxanne Gates 10/08/2018, 4:13 PM  [facility] [address] [place], [place], [address] Phone: [phone]   Fax:  [phone]  Name: Cherysh Epperly MRN: [id number] Date of Birth: January 28, 1946

## 2018-10-13 ENCOUNTER — Ambulatory Visit: Payer: Medicare Other | Admitting: Physical Therapy

## 2018-10-13 ENCOUNTER — Encounter: Payer: Self-pay | Admitting: Physical Therapy

## 2018-10-13 ENCOUNTER — Other Ambulatory Visit: Payer: Self-pay

## 2018-10-13 DIAGNOSIS — M25612 Stiffness of left shoulder, not elsewhere classified: Secondary | ICD-10-CM

## 2018-10-13 DIAGNOSIS — G8929 Other chronic pain: Secondary | ICD-10-CM

## 2018-10-13 DIAGNOSIS — M25511 Pain in right shoulder: Secondary | ICD-10-CM | POA: Diagnosis not present

## 2018-10-13 DIAGNOSIS — M25611 Stiffness of right shoulder, not elsewhere classified: Secondary | ICD-10-CM

## 2018-10-13 DIAGNOSIS — M6281 Muscle weakness (generalized): Secondary | ICD-10-CM

## 2018-10-13 DIAGNOSIS — M25512 Pain in left shoulder: Secondary | ICD-10-CM

## 2018-10-13 NOTE — Therapy (Signed)
[place] [facility] [address]. [place], [place], [address] Phone: [phone]   Fax:  [phone]  Physical Therapy Treatment  Patient Details  Name: Sabrina Green MRN: [id number] Date of Birth: April 24, 1946 Referring Provider (PT): Dr. Milinda Pointer   Encounter Date: 10/13/2018  PT End of Session - 10/13/18 1552    Visit Number  5    Number of Visits  9    Date for PT Re-Evaluation  10/22/18    Authorization - Visit Number  5    Authorization - Number of Visits  10    PT Start Time  1300    PT Stop Time  9774    PT Time Calculation (min)  45 min    Activity Tolerance  Patient tolerated treatment well    Behavior During Therapy  [facility] for tasks assessed/performed       Past Medical History:  Diagnosis Date  . Arthritis   . Hypertension   . Rheumatoid arthritis [facility])     Past Surgical History:  Procedure Laterality Date  . ABDOMINAL HYSTERECTOMY    . ABDOMINAL HYSTERECTOMY    . [place], [place]  . CESAREAN SECTION    . CHOLECYSTECTOMY    . EYE SURGERY Bilateral 2015   cataract surgery, [place], [place]  . KNEE ARTHROSCOPY    . TONSILLECTOMY    . TUBAL LIGATION      There were no vitals filed for this visit.  Subjective Assessment - 10/13/18 1315    Subjective  Pt stated that she did not have as much soreness following therapy last time.  She feels that she has not had as much pain in her shoulders in the past 4 days.    Pertinent History  Previous PT for L rotator cuff repair. Chronic LBP, high blood pressure, asthma, RA, B chronic knee pain (PT treatment). Pt. is being treated for fibromyalgia and pt. states doctor has informed her that she has an autoimmune disease but they do not know what it is.     Limitations  House hold activities;Writing    Patient Stated Goals  decr. pain and "free up" shoulder         Manual therapy Shoulder mobilization a-p  Grade  3-4, 4x40 sec bilaterally with pt in supine PROM shoulder flexion and abduction 4x15 sec bilaterally with pt in supine Passive shoulder ER/IR x2 min bilaterally  There ex Ball rolling on wall x60 sec shoulder flexion and abduction bilaterally Supine triceps extension 2x12 4 lbs Chest press x12 4 lbs Lateral raises 8x2 lbs, 4x1 lb,  Triceps extension with YTB x30 sec bilat Scapular retraction 10x3 sec hold pec stretch 2x30 sec Posterior shoulder stretch 2x30 sec Shoulder ER YTB 10 shoulder IR YTB x10                         PT Education - 10/13/18 1317    Education Details  Stretching mechanics    Person(s) Educated  Patient    Methods  Explanation    Comprehension  Verbalized understanding          PT Long Term Goals - 09/24/18 1341      PT LONG TERM GOAL #1   Title  Pt. will increase FOTO score to >62 to improve pain-free functional mobility and ADLs.    Baseline  Initial FOTO: 55    Time  4  Period  Weeks    Status  New    Target Date  10/22/18      PT LONG TERM GOAL #2   Title  Pt. will improve B UE MMT by 1/2 grade in order to improve pain-free mobility with ADLs.    Baseline  Eval MMT: Flexion: R 4 / L 4  Abduction: R 3+ / L 4  Biceps: R 4 /  L 4+  Triceps: R 4 / L 4+ ER: R 4 /  L 5 IR: R 4 / L 5, Grip R 37# / L 38#    Time  4    Period  Weeks    Status  New    Target Date  10/22/18      PT LONG TERM GOAL #3   Title  Pt. will be able to demonstrate proper seated/ upright posture to improve scapular mobility and promote pain-free functional mobility.    Baseline  Pt. demonstrates a signficant forward head posture with rounded shoulders    Time  4    Period  Weeks    Status  New    Target Date  10/22/18      PT LONG TERM GOAL #4   Title  Pt. will be able to complete HEP independently with 0/10 pain to improve functional mobility and promote overall UE muscle endurance.    Baseline  Pt. was prescribed HEP with scap retractions,  pendulums and doorway stretch on eval.    Time  4    Period  Weeks    Status  New    Target Date  10/22/18            Plan - 10/13/18 1559    Clinical Impression Statement  Pt presenting with improved ROM and decreased pain today.  She was able to increase weight with most shoulder there ex.  PT re-introduced scapular retraction and discussed the benefits.  Pt was agreeable.  She demonstrated a 5 degree increase in shoulder flexion and abduction following supine manual therapy and reported no pain following joint mobilizations today.  Pt more focused today and asking questions.  Pt will continue to benefit from skilled PT with focus on shoulder ROM, strength and functional mobility.    Stability/Clinical Decision Making  Evolving/Moderate complexity    Rehab Potential  Fair    Clinical Impairments Affecting Rehab Potential  (-) OA, RA, signficant medical hx, chronic pain (+) family support, motivation    PT Frequency  2x / week    PT Duration  4 weeks    PT Treatment/Interventions  ADLs/Self Care Home Management;Cryotherapy;Electrical Stimulation;Moist Heat;Functional mobility training;Therapeutic activities;Therapeutic exercise;Neuromuscular re-education;Manual techniques;Patient/family education;Passive range of motion;Dry needling;Energy conservation    PT Next Visit Plan  Assess scapular mobility, continue with mobilizations and progress postural/ UE strengthening.    PT Home Exercise Plan  see handouts       Patient will benefit from skilled therapeutic intervention in order to improve the following deficits and impairments:  Improper body mechanics, Pain, Postural dysfunction, Decreased mobility, Decreased activity tolerance, Decreased endurance, Decreased range of motion, Decreased strength, Hypomobility, Obesity  Visit Diagnosis: Chronic right shoulder pain  Chronic left shoulder pain  Joint stiffness of both shoulders  Muscle weakness     Problem List Patient Active  Problem List   Diagnosis Date Noted  . Neurogenic pain 03/02/2018  . Tricompartmental disease of knee (Bilateral) 02/04/2018  . Chronic low back pain (Secondary Area of Pain) (Bilateral) (L>R) 02/04/2018  .  Lumbar facet syndrome (Bilateral) (L>R) 02/04/2018  . Spondylosis without myelopathy or radiculopathy, lumbosacral region 02/04/2018  . History of claustrophobia 01/05/2018  . History of panic attacks 01/05/2018  . Chronic shoulder pain (Bilateral) 01/05/2018  . Osteoarthritis of shoulder (Bilateral) 01/05/2018  . Osteoarthritis of knees (Bilateral) 01/05/2018  . Elevated C-reactive protein (CRP) 12/15/2017  . Chronic pain syndrome 12/11/2017  . Pharmacologic therapy 12/11/2017  . Disorder of skeletal system 12/11/2017  . Problems influencing health status 12/11/2017  . Chronic knee pain (Primary Area of Pain) (Bilateral) (L>R) 12/11/2017  . Chronic lower extremity pain [facility] Area of Pain) (Left) 12/11/2017  . Chronic shoulder pain (Fourth Area of Pain) (Right) 12/11/2017  . Chronic hip pain (Left) 12/11/2017  . Fatty liver disease, nonalcoholic 33/00/7622  . DDD (degenerative disc disease), lumbar 06/02/2017  . Chronic low back pain (Midline) 04/22/2017  . CKD (chronic kidney disease) stage 3, GFR 30-59 ml/min (HCC) 03/28/2017  . BMI 40.0-44.9, adult ([place]) 03/24/2017  . Elevated erythrocyte sedimentation rate 02/12/2017  . Osteoarthritis of knee (Left) 12/26/2016  . Shoulder bursitis (Right) 06/06/2016  . Encounter for long-term (current) use of high-risk medication 03/06/2016  . Long term current use of opiate analgesic 03/06/2016  . Chronic gout 02/26/2016  . Essential hypertension 02/26/2016  . Exercise-induced asthma 02/26/2016  . Fibromyalgia 02/26/2016  . GERD without esophagitis 02/26/2016  . IBS (irritable bowel syndrome) 02/26/2016  . Rheumatoid arthritis involving multiple sites ([place]) 02/26/2016  . Vitamin B12 deficiency 02/26/2016  . Vitamin D deficiency  02/26/2016   Roxanne Gates, PT, DPT   Roxanne Gates 10/13/2018, 4:00 PM  [place] [facility] [address] [place], [place], [address] Phone: [phone]   Fax:  [phone]  Name: Sabrina Green MRN: [id number] Date of Birth: 1946/05/21

## 2018-10-14 DIAGNOSIS — G5601 Carpal tunnel syndrome, right upper limb: Secondary | ICD-10-CM | POA: Insufficient documentation

## 2018-10-14 DIAGNOSIS — G5602 Carpal tunnel syndrome, left upper limb: Secondary | ICD-10-CM | POA: Insufficient documentation

## 2018-10-15 ENCOUNTER — Ambulatory Visit: Payer: Medicare Other

## 2018-10-19 ENCOUNTER — Ambulatory Visit: Payer: Medicare Other

## 2018-11-03 ENCOUNTER — Ambulatory Visit: Payer: Medicare Other | Attending: Nurse Practitioner | Admitting: Nurse Practitioner

## 2018-11-03 ENCOUNTER — Other Ambulatory Visit: Payer: Self-pay

## 2018-11-03 DIAGNOSIS — M069 Rheumatoid arthritis, unspecified: Secondary | ICD-10-CM

## 2018-11-03 DIAGNOSIS — M792 Neuralgia and neuritis, unspecified: Secondary | ICD-10-CM

## 2018-11-03 DIAGNOSIS — G894 Chronic pain syndrome: Secondary | ICD-10-CM

## 2018-11-03 DIAGNOSIS — M797 Fibromyalgia: Secondary | ICD-10-CM

## 2018-11-03 NOTE — Progress Notes (Deleted)
Pain Management Encounter Note - Virtual Visit via Telephone Telehealth (real-time audio visits between healthcare provider and patient).  Patient's Phone No. & Preferred Pharmacy:  [phone] (home); [phone] (mobile); (Preferred) [phone]  [facility] - [place], [place] - [address] [place] [address] Phone: [phone] Fax: [phone]   Pre-screening note:  Our staff contacted Sabrina Green and offered her an "in person", "face-to-face" appointment versus a telephone encounter. She indicated preferring the telephone encounter, at this time.  Reason for Virtual Visit: COVID-19*  Social distancing based on CDC and AMA recommendations.   I contacted Sabrina Green on 11/04/2018 at 9:17 PM by telephone and clearly identified myself as Dionisio David, NP. I verified that I was speaking with the correct person using two identifiers (Name and date of birth: 1945-09-03).  Advanced Informed Consent I sought verbal advanced consent from Sabrina Green for telemedicine interactions and virtual visit. I informed Sabrina Green of the security and privacy concerns, risks, and limitations associated with performing an evaluation and management service by telephone. I also informed Sabrina Green of the availability of "in person" appointments and I informed her of the possibility of a patient responsible charge related to this service. Sabrina Green expressed understanding and agreed to proceed.   Historic Elements   Sabrina Green is a 73 y.o. year old, female patient evaluated today after her last encounter by our practice on 09/07/2018. Sabrina Green  has a past medical history of Arthritis, Hypertension, and Rheumatoid arthritis ([place]). She also  has a past surgical history that includes Tonsillectomy; Cesarean section; Tubal ligation; Abdominal hysterectomy; Knee arthroscopy; Cholecystectomy; Abdominal hysterectomy; Ankle fracture surgery (Right, 1989); and Eye  surgery (Bilateral, 2015). Sabrina Green has a current medication list which includes the following prescription(s): allopurinol, calcium-vitamin d, cholecalciferol, fluticasone, furosemide, gabapentin, losartan, lubiprostone, magnesium, methotrexate, mupirocin ointment, omeprazole, promethazine, propranolol, sertraline, and tizanidine. She  reports that she has never smoked. She has never used smokeless tobacco. She reports that she does not drink alcohol or use drugs. Sabrina Green is allergic to penicillins.   HPI  I last saw her on Visit date not found. She is being evaluated for medication management. She admits that she 0/10  Pharmacotherapy Assessment  Analgesic: None MME/day: 0 mg/day.   Monitoring: Pharmacotherapy: No side-effects or adverse reactions reported. [place] PMP: PDMP reviewed during this encounter.       Compliance: No problems identified. Plan: Refer to "POC".  Review of recent tests  DG C-Arm 1-60 Min-No Report Fluoroscopy was utilized by the requesting physician.  No radiographic  interpretation.    Office Visit on 12/11/2017  Component Date Value Ref Range Status  . Summary 12/11/2017 FINAL   Final   Comment: ==================================================================== TOXAS[facility]RE COMP DRUG ANALYSIS,UR ==================================================================== Test                             Result       Flag       Units Drug Present and Declared for Prescription Verification   Gabapentin                     PRESENT      EXPECTED   Propranolol                    PRESENT      EXPECTED Drug Present not Declared for Prescription Verification   Acetaminophen  PRESENT      UNEXPECTED   Diphenhydramine                PRESENT      UNEXPECTED Drug Absent but Declared for Prescription Verification   Tizanidine                     Not Detected UNEXPECTED    Tizanidine, as indicated in the declared medication list, is not    always detected even  when used as directed.   Sertraline                     Not Detected UNEXPECTED   Promethazine                   Not Detected UNEXPECTED ==================================================================== Test                                                Result    Flag   Units      Ref Range   Creatinine              117              mg/dL      >=20 ==================================================================== Declared Medications:  The flagging and interpretation on this report are based on the  following declared medications.  Unexpected results may arise from  inaccuracies in the declared medications.  **Note: The testing scope of this panel includes these medications:  Gabapentin (Neurontin)  Promethazine (Phenergan)  Propranolol (Inderal)  Sertraline (Zoloft)  **Note: The testing scope of this panel does not include small to  moderate amounts of these reported medications:  Tizanidine (Zanaflex)  **Note: The testing scope of this panel does not include following  reported medications:  Allopurinol  Calcium (Calcium/Vitamin D)  Doxycycline (Vibramycin)  Estradiol (Estrace)  Fluticasone (Flonase)  Furosemide (Lasix)  Losartan (Cozaar)  Lubiprostone (Amitiza)  Methotrexate (Rheuma                          trex)  Mupirocin (Bactroban)  Omeprazole (Prilosec)  Vitamin D  Vitamin D (Calcium/Vitamin D) ==================================================================== For clinical consultation, please call [phone]. ====================================================================   . Glucose 12/11/2017 90  65 - 99 mg/dL Final  . BUN 12/11/2017 24  8 - 27 mg/dL Final  . Creatinine, Ser 12/11/2017 1.54* 0.57 - 1.00 mg/dL Final  . GFR calc non Af Amer 12/11/2017 34* >59 mL/min/1.73 Final  . GFR calc Af Amer 12/11/2017 39* >59 mL/min/1.73 Final  . BUN/Creatinine Ratio 12/11/2017 16  12 - 28 Final  . Sodium 12/11/2017 146* 134 - 144 mmol/L Final  .  Potassium 12/11/2017 4.5  3.5 - 5.2 mmol/L Final  . Chloride 12/11/2017 104  96 - 106 mmol/L Final  . Calcium 12/11/2017 9.4  8.7 - 10.3 mg/dL Final  . Total Protein 12/11/2017 6.4  6.0 - 8.5 g/dL Final  . Albumin 12/11/2017 3.9  3.6 - 4.8 g/dL Final  . Globulin, Total 12/11/2017 2.5  1.5 - 4.5 g/dL Final  . Albumin/Globulin Ratio 12/11/2017 1.6  1.2 - 2.2 Final  . Bilirubin Total 12/11/2017 0.5  0.0 - 1.2 mg/dL Final  . Alkaline Phosphatase 12/11/2017 79  39 - 117 IU/L Final  . AST 12/11/2017 14  0 - 40 IU/L  Final  . Magnesium 12/11/2017 1.7  1.6 - 2.3 mg/dL Final  . Vitamin B-12 12/11/2017 358  232 - 1,245 pg/mL Final  . Sed Rate 12/11/2017 90* 0 - 40 mm/hr Final  . 25-Hydroxy, Vitamin D 12/11/2017 46  ng/mL Final   Comment: Reference Range: All Ages: Target levels 30 - 100   . 25-Hydroxy, Vitamin D-2 12/11/2017 <1.0  ng/mL Final  . 25-Hydroxy, Vitamin D-3 12/11/2017 46  ng/mL Final  . CRP 12/11/2017 19.5* 0.0 - 4.9 mg/L Final  . Rhuematoid fact SerPl-aCnc 12/11/2017 <10.0  0.0 - 13.9 IU/mL Final  . Anti Nuclear Antibody(ANA) 12/11/2017 Negative  Negative Final  . specimen status report 12/11/2017 Comment   Final   Comment: Written Authorization Written Authorization Written Authorization Received. Authorization received from [facility] 12-20-2017 Logged by Lenice Llamas    Assessment  The primary encounter diagnosis was Chronic knee pain (Primary Area of Pain) (Bilateral) (L>R). Diagnoses of Fibromyalgia, Neurogenic pain, and Tricompartmental disease of knee (Bilateral) were also pertinent to this visit.  Plan of Care  I am having Sabrina Green maintain her allopurinol, losartan, furosemide, methotrexate, cholecalciferol, mupirocin ointment, lubiprostone, sertraline, propranolol, fluticasone, tiZANidine, calcium-vitamin D, omeprazole, promethazine, Magnesium, and gabapentin.  Pharmacotherapy (Medications Ordered): No orders of the defined types were placed in this  encounter.  Orders:  No orders of the defined types were placed in this encounter.  Follow-up plan:   No follow-ups on file.   I discussed the assessment and treatment plan with the patient. The patient was provided an opportunity to ask questions and all were answered. The patient agreed with the plan and demonstrated an understanding of the instructions.  Patient advised to call back or seek an in-person evaluation if the symptoms or condition worsens.  Total duration of non-face-to-face encounter: 10  minutes.  Note by: Dionisio David, NP Date: 11/04/2018; Time: 12:35 PM  Disclaimer:  * Given the special circumstances of the COVID-19 pandemic, the federal government has announced that the Office for Civil Rights (OCR) will exercise its enforcement discretion and will not impose penalties on physicians using telehealth in the event of noncompliance with regulatory requirements under the [place] (HIPAA) in connection with the good faith provision of telehealth during the HALPF-79 national public health emergency. ([place])

## 2018-11-04 ENCOUNTER — Telehealth: Payer: Medicare Other | Admitting: Nurse Practitioner

## 2018-11-04 MED ORDER — GABAPENTIN 300 MG PO CAPS: 300.0000 mg | ORAL_CAPSULE | Freq: Four times a day (QID) | ORAL | 1 refills | Status: DC

## 2018-11-04 NOTE — Progress Notes (Signed)
Pain Management Encounter Note - Virtual Visit via Telephone Telehealth (real-time audio visits between healthcare provider and patient).  Patient's Phone No. & Preferred Pharmacy:  [phone] (home); [phone] (mobile); (Preferred) [phone]  [facility] - [place], [place] - [address] [place] [address] Phone: [phone] Fax: [phone]   Pre-screening note:  Our staff contacted Ms. Hillegass and offered her an "in person", "face-to-face" appointment versus a telephone encounter. She indicated preferring the telephone encounter, at this time.  Reason for Virtual Visit: COVID-19*  Social distancing based on CDC and AMA recommendations.   I contacted Gabbi Whetstone Mcnally on 11/03/2018 at 9:30 AM by telephone and clearly identified myself as Dionisio David, NP. I verified that I was speaking with the correct person using two identifiers (Name and date of birth: 11-18-1945).  Advanced Informed Consent I sought verbal advanced consent from [place] for telemedicine interactions and virtual visit. I informed Ms. Shomaker of the security and privacy concerns, risks, and limitations associated with performing an evaluation and management service by telephone. I also informed Ms. Lappe of the availability of "in person" appointments and I informed her of the possibility of a patient responsible charge related to this service. Ms. Nussbaumer expressed understanding and agreed to proceed.   Historic Elements   Ms. Aditri Louischarles is a 73 y.o. year old, female patient evaluated today after her last encounter by our practice on 09/07/2018. Ms. Koval  has a past medical history of Arthritis, Hypertension, and Rheumatoid arthritis ([place]). She also  has a past surgical history that includes Tonsillectomy; Cesarean section; Tubal ligation; Abdominal hysterectomy; Knee arthroscopy; Cholecystectomy; Abdominal hysterectomy; Ankle fracture surgery (Right, 1989); and Eye  surgery (Bilateral, 2015). Ms. Boord has a current medication list which includes the following prescription(s): allopurinol, calcium-vitamin d, cholecalciferol, fluticasone, furosemide, gabapentin, losartan, lubiprostone, magnesium, methotrexate, mupirocin ointment, omeprazole, promethazine, propranolol, sertraline, and tizanidine. She  reports that she has never smoked. She has never used smokeless tobacco. She reports that she does not drink alcohol or use drugs. Ms. Kuba is allergic to penicillins.   HPI  I last saw her on Visit date not found. She is being evaluated for medication management. She admits that she is doing well today. She denies any current pain. She has RA. She is being seen by rheumatology.  She would like to have her gabapentin refilled.    Pharmacotherapy Assessment  Analgesic:  None MME/day: 0 mg/day.   Monitoring: Pharmacotherapy: No side-effects or adverse reactions reported. [place] PMP: PDMP not reviewed this encounter.       Compliance: No problems identified. Plan: Refer to "POC".  Review of recent tests  DG C-Arm 1-60 Min-No Report Fluoroscopy was utilized by the requesting physician.  No radiographic  interpretation.    Office Visit on 12/11/2017  Component Date Value Ref Range Status  . Summary 12/11/2017 FINAL   Final   Comment: ==================================================================== TOXASSURE COMP DRUG ANALYSIS,UR ==================================================================== Test                             Result       Flag       Units Drug Present and Declared for Prescription Verification   Gabapentin                     PRESENT      EXPECTED   Propranolol  PRESENT      EXPECTED Drug Present not Declared for Prescription Verification   Acetaminophen                  PRESENT      UNEXPECTED   Diphenhydramine                PRESENT      UNEXPECTED Drug Absent but Declared for Prescription Verification    Tizanidine                     Not Detected UNEXPECTED    Tizanidine, as indicated in the declared medication list, is not    always detected even when used as directed.   Sertraline                     Not Detected UNEXPECTED   Promethazine                   Not Detected UNEXPECTED ==================================================================== Test                                                Result    Flag   Units      Ref Range   Creatinine              117              mg/dL      >=20 ==================================================================== Declared Medications:  The flagging and interpretation on this report are based on the  following declared medications.  Unexpected results may arise from  inaccuracies in the declared medications.  **Note: The testing scope of this panel includes these medications:  Gabapentin (Neurontin)  Promethazine (Phenergan)  Propranolol (Inderal)  Sertraline (Zoloft)  **Note: The testing scope of this panel does not include small to  moderate amounts of these reported medications:  Tizanidine (Zanaflex)  **Note: The testing scope of this panel does not include following  reported medications:  Allopurinol  Calcium (Calcium/Vitamin D)  Doxycycline (Vibramycin)  Estradiol (Estrace)  Fluticasone (Flonase)  Furosemide (Lasix)  Losartan (Cozaar)  Lubiprostone (Amitiza)  Methotrexate (Rheuma                          trex)  Mupirocin (Bactroban)  Omeprazole (Prilosec)  Vitamin D  Vitamin D (Calcium/Vitamin D) ==================================================================== For clinical consultation, please call [phone]. ====================================================================   . Glucose 12/11/2017 90  65 - 99 mg/dL Final  . BUN 12/11/2017 24  8 - 27 mg/dL Final  . Creatinine, Ser 12/11/2017 1.54* 0.57 - 1.00 mg/dL Final  . GFR calc non Af Amer 12/11/2017 34* >59 mL/min/1.73 Final  . GFR calc Af Amer  12/11/2017 39* >59 mL/min/1.73 Final  . BUN/Creatinine Ratio 12/11/2017 16  12 - 28 Final  . Sodium 12/11/2017 146* 134 - 144 mmol/L Final  . Potassium 12/11/2017 4.5  3.5 - 5.2 mmol/L Final  . Chloride 12/11/2017 104  96 - 106 mmol/L Final  . Calcium 12/11/2017 9.4  8.7 - 10.3 mg/dL Final  . Total Protein 12/11/2017 6.4  6.0 - 8.5 g/dL Final  . Albumin 12/11/2017 3.9  3.6 - 4.8 g/dL Final  . Globulin, Total 12/11/2017 2.5  1.5 - 4.5 g/dL Final  . Albumin/Globulin Ratio 12/11/2017 1.6  1.2 - 2.2 Final  .  Bilirubin Total 12/11/2017 0.5  0.0 - 1.2 mg/dL Final  . Alkaline Phosphatase 12/11/2017 79  39 - 117 IU/L Final  . AST 12/11/2017 14  0 - 40 IU/L Final  . Magnesium 12/11/2017 1.7  1.6 - 2.3 mg/dL Final  . Vitamin B-12 12/11/2017 358  232 - 1,245 pg/mL Final  . Sed Rate 12/11/2017 90* 0 - 40 mm/hr Final  . 25-Hydroxy, Vitamin D 12/11/2017 46  ng/mL Final   Comment: Reference Range: All Ages: Target levels 30 - 100   . 25-Hydroxy, Vitamin D-2 12/11/2017 <1.0  ng/mL Final  . 25-Hydroxy, Vitamin D-3 12/11/2017 46  ng/mL Final  . CRP 12/11/2017 19.5* 0.0 - 4.9 mg/L Final  . Rhuematoid fact SerPl-aCnc 12/11/2017 <10.0  0.0 - 13.9 IU/mL Final  . Anti Nuclear Antibody(ANA) 12/11/2017 Negative  Negative Final  . specimen status report 12/11/2017 Comment   Final   Comment: Written Authorization Written Authorization Written Authorization Received. Authorization received from [facility] 12-20-2017 Logged by Lenice Llamas    Assessment  The primary encounter diagnosis was Rheumatoid arthritis involving multiple sites, unspecified rheumatoid factor presence ([place]). Diagnoses of Fibromyalgia, Neurogenic pain, and Chronic pain syndrome were also pertinent to this visit.  Plan of Care  I am having Blanch Media A. Freeland maintain her allopurinol, losartan, furosemide, methotrexate, cholecalciferol, mupirocin ointment, lubiprostone, sertraline, propranolol, fluticasone, tiZANidine, calcium-vitamin D,  omeprazole, promethazine, Magnesium, and gabapentin.  Pharmacotherapy (Medications Ordered): Meds ordered this encounter  Medications  . gabapentin (NEURONTIN) 300 MG capsule    Sig: Take 1 capsule (300 mg total) by mouth 4 (four) times daily.    Dispense:  360 capsule    Refill:  1    Do not place medication on "Automatic Refill". Fill one day early if pharmacy is closed on scheduled refill date.    Order Specific Question:   Supervising Provider    Answer:   Milinda Pointer [phone]   Orders:  No orders of the defined types were placed in this encounter.  Follow-up plan:   No follow-ups on file.   I discussed the assessment and treatment plan with the patient. The patient was provided an opportunity to ask questions and all were answered. The patient agreed with the plan and demonstrated an understanding of the instructions.  Patient advised to call back or seek an in-person evaluation if the symptoms or condition worsens.  Total duration of non-face-to-face encounter: 12 minutes.  Note by: Dionisio David, NP Date: 11/03/2018; Time: 1:17 PM  Disclaimer:  * Given the special circumstances of the COVID-19 pandemic, the federal government has announced that the Office for Civil Rights (OCR) will exercise its enforcement discretion and will not impose penalties on physicians using telehealth in the event of noncompliance with regulatory requirements under the [place] (HIPAA) in connection with the good faith provision of telehealth during the QQIWL-79 national public health emergency. ([place])

## 2019-01-25 ENCOUNTER — Encounter (INDEPENDENT_AMBULATORY_CARE_PROVIDER_SITE_OTHER): Payer: Self-pay

## 2019-01-25 ENCOUNTER — Other Ambulatory Visit: Payer: Self-pay

## 2019-01-25 ENCOUNTER — Ambulatory Visit
Admission: RE | Admit: 2019-01-25 | Discharge: 2019-01-25 | Disposition: A | Discharge status: Home / Self Care | Payer: Medicare Other | Source: Ambulatory Visit | Attending: Family Medicine | Admitting: Family Medicine

## 2019-01-25 DIAGNOSIS — Z1231 Encounter for screening mammogram for malignant neoplasm of breast: Secondary | ICD-10-CM | POA: Diagnosis not present

## 2019-01-26 ENCOUNTER — Other Ambulatory Visit: Payer: Self-pay | Admitting: Family Medicine

## 2019-01-26 DIAGNOSIS — R928 Other abnormal and inconclusive findings on diagnostic imaging of breast: Secondary | ICD-10-CM

## 2019-01-26 DIAGNOSIS — N6310 Unspecified lump in the right breast, unspecified quadrant: Secondary | ICD-10-CM

## 2019-01-26 DIAGNOSIS — R921 Mammographic calcification found on diagnostic imaging of breast: Secondary | ICD-10-CM

## 2019-02-01 DIAGNOSIS — M19049 Primary osteoarthritis, unspecified hand: Secondary | ICD-10-CM | POA: Insufficient documentation

## 2019-02-03 ENCOUNTER — Ambulatory Visit
Admission: RE | Admit: 2019-02-03 | Discharge: 2019-02-03 | Disposition: A | Discharge status: Home / Self Care | Payer: Medicare Other | Source: Ambulatory Visit | Attending: Family Medicine | Admitting: Family Medicine

## 2019-02-03 ENCOUNTER — Ambulatory Visit: Payer: Medicare Other

## 2019-02-03 ENCOUNTER — Other Ambulatory Visit: Payer: Self-pay

## 2019-02-03 DIAGNOSIS — R921 Mammographic calcification found on diagnostic imaging of breast: Secondary | ICD-10-CM

## 2019-02-03 DIAGNOSIS — R928 Other abnormal and inconclusive findings on diagnostic imaging of breast: Secondary | ICD-10-CM

## 2019-02-03 DIAGNOSIS — N6310 Unspecified lump in the right breast, unspecified quadrant: Secondary | ICD-10-CM

## 2019-02-04 ENCOUNTER — Other Ambulatory Visit: Payer: Self-pay | Admitting: Family Medicine

## 2019-02-04 DIAGNOSIS — R921 Mammographic calcification found on diagnostic imaging of breast: Secondary | ICD-10-CM

## 2019-02-04 DIAGNOSIS — R928 Other abnormal and inconclusive findings on diagnostic imaging of breast: Secondary | ICD-10-CM

## 2019-02-04 DIAGNOSIS — N6310 Unspecified lump in the right breast, unspecified quadrant: Secondary | ICD-10-CM

## 2019-02-09 ENCOUNTER — Ambulatory Visit
Admission: RE | Admit: 2019-02-09 | Discharge: 2019-02-09 | Disposition: A | Discharge status: Home / Self Care | Payer: Medicare Other | Source: Ambulatory Visit | Attending: Family Medicine | Admitting: Family Medicine

## 2019-02-09 ENCOUNTER — Other Ambulatory Visit: Payer: Self-pay

## 2019-02-09 DIAGNOSIS — R921 Mammographic calcification found on diagnostic imaging of breast: Secondary | ICD-10-CM

## 2019-02-09 DIAGNOSIS — R928 Other abnormal and inconclusive findings on diagnostic imaging of breast: Secondary | ICD-10-CM

## 2019-02-09 DIAGNOSIS — N6310 Unspecified lump in the right breast, unspecified quadrant: Secondary | ICD-10-CM

## 2019-02-09 HISTORY — PX: BREAST BIOPSY: SHX20

## 2019-02-10 LAB — SURGICAL PATHOLOGY

## 2019-02-22 DIAGNOSIS — M7552 Bursitis of left shoulder: Secondary | ICD-10-CM | POA: Insufficient documentation

## 2019-05-24 ENCOUNTER — Telehealth: Payer: Self-pay | Admitting: Pain Medicine

## 2019-05-24 NOTE — Telephone Encounter (Signed)
Patient lvmail saying she does not have a refill on her meds Gabapentin and also would like to get Cortisone shot in both knees. She says the other shots she had before did not help and hurt so much she had to lay in bed 2 days.  Please check on medications and let patient know if Dr. Dossie Arbour does cortisone shots.

## 2019-05-24 NOTE — Telephone Encounter (Signed)
Has orders for knee injection and genicular nerve block. States she doesn't want a nerve block, and doesn't want what she had last time, which was knee injection. States she wants "cortisone shot."  I explained to her that I believe the injection she had before is probable what she is referring to as a "cortisone shot." She agrees to do that. I also told her she must have appt to get refill for Gabapentin.  Please call patient to schedule knee injection.

## 2019-05-27 ENCOUNTER — Ambulatory Visit: Payer: Medicare Other | Admitting: Pain Medicine

## 2019-06-01 ENCOUNTER — Other Ambulatory Visit: Payer: Self-pay

## 2019-06-01 ENCOUNTER — Ambulatory Visit
Admission: RE | Admit: 2019-06-01 | Discharge: 2019-06-01 | Disposition: A | Discharge status: Home / Self Care | Payer: Medicare Other | Source: Ambulatory Visit | Attending: Pain Medicine | Admitting: Pain Medicine

## 2019-06-01 ENCOUNTER — Ambulatory Visit (HOSPITAL_BASED_OUTPATIENT_CLINIC_OR_DEPARTMENT_OTHER): Payer: Medicare Other | Admitting: Pain Medicine

## 2019-06-01 VITALS — BP 161/72 | HR 60 | Temp 99.1°F | Resp 18 | Ht 65.0 in | Wt 233.0 lb

## 2019-06-01 DIAGNOSIS — M25561 Pain in right knee: Secondary | ICD-10-CM

## 2019-06-01 DIAGNOSIS — M792 Neuralgia and neuritis, unspecified: Secondary | ICD-10-CM

## 2019-06-01 DIAGNOSIS — M25562 Pain in left knee: Secondary | ICD-10-CM

## 2019-06-01 DIAGNOSIS — M170 Bilateral primary osteoarthritis of knee: Secondary | ICD-10-CM

## 2019-06-01 DIAGNOSIS — G8929 Other chronic pain: Secondary | ICD-10-CM

## 2019-06-01 DIAGNOSIS — M797 Fibromyalgia: Secondary | ICD-10-CM | POA: Insufficient documentation

## 2019-06-01 MED ORDER — LIDOCAINE HCL 2 % IJ SOLN
INTRAMUSCULAR | Status: AC
  Filled 2019-06-01: qty 20

## 2019-06-01 MED ORDER — ROPIVACAINE HCL 2 MG/ML IJ SOLN
3.0000 mL | Freq: Once | INTRAMUSCULAR | Status: AC
  Administered 2019-06-01: 14:00:00 3 mL via INTRA_ARTICULAR
  Filled 2019-06-01: qty 10

## 2019-06-01 MED ORDER — ROPIVACAINE HCL 2 MG/ML IJ SOLN
3.0000 mL | Freq: Once | INTRAMUSCULAR | Status: AC
  Administered 2019-06-01: 3 mL

## 2019-06-01 MED ORDER — METHYLPREDNISOLONE ACETATE 80 MG/ML IJ SUSP
INTRAMUSCULAR | Status: AC
  Filled 2019-06-01: qty 1

## 2019-06-01 MED ORDER — METHYLPREDNISOLONE ACETATE 40 MG/ML IJ SUSP
INTRAMUSCULAR | Status: AC
  Filled 2019-06-01: qty 1

## 2019-06-01 MED ORDER — METHYLPREDNISOLONE ACETATE 80 MG/ML IJ SUSP
80.0000 mg | Freq: Once | INTRAMUSCULAR | Status: AC
  Administered 2019-06-01: 80 mg

## 2019-06-01 MED ORDER — LIDOCAINE HCL 2 % IJ SOLN
20.0000 mL | Freq: Once | INTRAMUSCULAR | Status: AC
  Administered 2019-06-01: 13:00:00 400 mg

## 2019-06-01 MED ORDER — ROPIVACAINE HCL 2 MG/ML IJ SOLN
INTRAMUSCULAR | Status: AC
  Filled 2019-06-01: qty 10

## 2019-06-01 MED ORDER — METHYLPREDNISOLONE ACETATE 80 MG/ML IJ SUSP
80.0000 mg | Freq: Once | INTRAMUSCULAR | Status: AC
  Administered 2019-06-01: 13:00:00 80 mg via INTRA_ARTICULAR

## 2019-06-01 NOTE — Addendum Note (Signed)
Addended by: Milinda Pointer A on: 06/01/2019 02:59 PM   Modules accepted: Orders

## 2019-06-01 NOTE — Patient Instructions (Signed)

## 2019-06-01 NOTE — Progress Notes (Signed)
Safety precautions to be maintained throughout the outpatient stay will include: orient to surroundings, keep bed in low position, maintain call bell within reach at all times, provide assistance with transfer out of bed and ambulation.  

## 2019-06-01 NOTE — Progress Notes (Addendum)
Patient's Name: Sabrina Green  MRN: [id number]  Referring Provider: Sofie Hartigan, MD  DOB: 1945/08/14  PCP: Sofie Hartigan, MD  DOS: 06/01/2019  Note by: Gaspar Cola, MD  Service setting: Ambulatory outpatient  Specialty: Interventional Pain Management  Patient type: Established  Location: [facility] (AMB) Pain Management Facility  Visit type: Interventional Procedure   Primary Reason for Visit: Interventional Pain Management Treatment. CC: Knee Pain (bilateral)  Procedure:          Anesthesia, Analgesia, Anxiolysis:  Type: Diagnostic Intra-Articular Local anesthetic and steroid Knee Injection #2  Region: Lateral infrapatellar Knee Region Level: Knee Joint Laterality: Bilateral  Type: Local Anesthesia Indication(s): Analgesia         Local Anesthetic: Lidocaine 1-2% Route: Infiltration ([place]/IM) IV Access: Declined Sedation: Declined   Position: Sitting   Indications: 1. Chronic knee pain (Primary Area of Pain) (Bilateral) (L>R)   2. Osteoarthritis of knees (Bilateral)   3. Tricompartmental disease of knee (Bilateral)    Pain Score: Pre-procedure: 4 /10 Post-procedure: 0-No pain/10   Pre-op Assessment:  Sabrina Green is a 73 y.o. (year old), female patient, seen today for interventional treatment. She  has a past surgical history that includes Tonsillectomy; Cesarean section; Tubal ligation; Abdominal hysterectomy; Knee arthroscopy; Cholecystectomy; Abdominal hysterectomy; Ankle fracture surgery (Right, 1989); Eye surgery (Bilateral, 2015); and Breast biopsy (Right, 02/09/2019). Sabrina Green has a current medication list which includes the following prescription(s): allopurinol, buspirone, cholecalciferol, fluticasone, losartan, lubiprostone, magnesium, mupirocin ointment, omeprazole, propranolol, sertraline, tizanidine, calcium-vitamin d, furosemide, gabapentin, methotrexate, and promethazine. Her primarily concern today is the Knee Pain (bilateral)  Initial Vital Signs:    Pulse/HCG Rate: 66  Temp: 99.1 F (37.3 C) Resp: 18 BP: (!) 158/63 SpO2: 100 %  BMI: Estimated body mass index is 38.77 kg/m as calculated from the following:   Height as of this encounter: 5\' 5"  (1.651 m).   Weight as of this encounter: 233 lb (105.7 kg).  Risk Assessment: Allergies: Reviewed. She is allergic to penicillins.  Allergy Precautions: None required Coagulopathies: Reviewed. None identified.  Blood-thinner therapy: None at this time Active Infection(s): Reviewed. None identified. Sabrina Green is afebrile  Site Confirmation: Sabrina Green was asked to confirm the procedure and laterality before marking the site Procedure checklist: Completed Consent: Before the procedure and under the influence of no sedative(s), amnesic(s), or anxiolytics, the patient was informed of the treatment options, risks and possible complications. To fulfill our ethical and legal obligations, as recommended by the American Medical Association's Code of Ethics, I have informed the patient of my clinical impression; the nature and purpose of the treatment or procedure; the risks, benefits, and possible complications of the intervention; the alternatives, including doing nothing; the risk(s) and benefit(s) of the alternative treatment(s) or procedure(s); and the risk(s) and benefit(s) of doing nothing. The patient was provided information about the general risks and possible complications associated with the procedure. These may include, but are not limited to: failure to achieve desired goals, infection, bleeding, organ or nerve damage, allergic reactions, paralysis, and death. In addition, the patient was informed of those risks and complications associated to the procedure, such as failure to decrease pain; infection; bleeding; organ or nerve damage with subsequent damage to sensory, motor, and/or autonomic systems, resulting in permanent pain, numbness, and/or weakness of one or several areas of the body;  allergic reactions; (i.e.: anaphylactic reaction); and/or death. Furthermore, the patient was informed of those risks and complications associated with the medications. These include, but are  not limited to: allergic reactions (i.e.: anaphylactic or anaphylactoid reaction(s)); adrenal axis suppression; blood sugar elevation that in diabetics may result in ketoacidosis or comma; water retention that in patients with history of congestive heart failure may result in shortness of breath, pulmonary edema, and decompensation with resultant heart failure; weight gain; swelling or edema; medication-induced neural toxicity; particulate matter embolism and blood vessel occlusion with resultant organ, and/or nervous system infarction; and/or aseptic necrosis of one or more joints. Finally, the patient was informed that Medicine is not an exact science; therefore, there is also the possibility of unforeseen or unpredictable risks and/or possible complications that may result in a catastrophic outcome. The patient indicated having understood very clearly. We have given the patient no guarantees and we have made no promises. Enough time was given to the patient to ask questions, all of which were answered to the patient's satisfaction. Sabrina Green has indicated that she wanted to continue with the procedure. Attestation: I, the ordering provider, attest that I have discussed with the patient the benefits, risks, side-effects, alternatives, likelihood of achieving goals, and potential problems during recovery for the procedure that I have provided informed consent. Date   Time: 06/01/2019  1:06 PM  Pre-Procedure Preparation:  Monitoring: As per clinic protocol. Respiration, ETCO2, SpO2, BP, heart rate and rhythm monitor placed and checked for adequate function Safety Precautions: Patient was assessed for positional comfort and pressure points before starting the procedure. Time-out: I initiated and conducted the "Time-out"  before starting the procedure, as per protocol. The patient was asked to participate by confirming the accuracy of the "Time Out" information. Verification of the correct person, site, and procedure were performed and confirmed by me, the nursing staff, and the patient. "Time-out" conducted as per Joint Commission's Universal Protocol (UP.01.01.01). Time: 1331  Description of Procedure:          Target Area: Knee Joint Approach: Just above the Lateral tibial plateau, lateral to the infrapatellar tendon. Area Prepped: Entire knee area, from the mid-thigh to the mid-shin. Prepping solution: DuraPrep (Iodine Povacrylex [0.7% available iodine] and Isopropyl Alcohol, 74% w/w) Safety Precautions: Aspiration looking for blood return was conducted prior to all injections. At no point did we inject any substances, as a needle was being advanced. No attempts were made at seeking any paresthesias. Safe injection practices and needle disposal techniques used. Medications properly checked for expiration dates. SDV (single dose vial) medications used. Description of the Procedure: Protocol guidelines were followed. The patient was placed in position over the fluoroscopy table. The target area was identified and the area prepped in the usual manner. Skin & deeper tissues infiltrated with local anesthetic. Appropriate amount of time allowed to pass for local anesthetics to take effect. The procedure needles were then advanced to the target area. Proper needle placement secured. Negative aspiration confirmed. Solution injected in intermittent fashion, asking for systemic symptoms every 0.5cc of injectate. The needles were then removed and the area cleansed, making sure to leave some of the prepping solution back to take advantage of its long term bactericidal properties. Vitals:   06/01/19 1305 06/01/19 1325 06/01/19 1335 06/01/19 1343  BP: (!) 158/63 (!) 156/65 (!) 158/74 (!) 161/72  Pulse: 66 66 61 60  Resp: 18 18  (!) 21 18  Temp: 99.1 F (37.3 C)     TempSrc: Temporal     SpO2: 100% 100% 100% 100%  Weight: 233 lb (105.7 kg)     Height: 5\' 5"  (1.651 m)  Start Time: 1331 hrs. End Time: 1340 hrs. Materials:  Needle(s) Type: Regular needle Gauge: 22G Length: 3.5-in Medication(s): Please see orders for medications and dosing details.  Imaging Guidance:          Type of Imaging Technique: Fluoroscopy Guidance (Non-spinal) Indication(s): Morbid obesity. Assistance in needle guidance and placement for procedures requiring needle placement in or near specific anatomical locations impossible to access without such assistance. Exposure Time: Please see nurses notes. Contrast: None used. Fluoroscopic Guidance: I was personally present during the use of fluoroscopy. "Tunnel Vision Technique" used to obtain the best possible view of the target area. Parallax error corrected before commencing the procedure. "Direction-depth-direction" technique used to introduce the needle under continuous pulsed fluoroscopy. Once target was reached, antero-posterior, oblique, and lateral fluoroscopic projection used confirm needle placement in all planes. Images permanently stored in EMR. Ultrasound Guidance: N/A       Interpretation: No contrast injected. I personally interpreted the imaging intraoperatively. Adequate needle placement confirmed in multiple planes. Permanent images saved into the patient's record.  The lateral compartment portion of both knees clearly seems to have lost most surface cartilage.  (See above)  Antibiotic Prophylaxis:   Anti-infectives (From admission, onward)   None     Indication(s): None identified  Post-operative Assessment:  Post-procedure Vital Signs:  Pulse/HCG Rate: 60  Temp: 99.1 F (37.3 C) Resp: 18 BP: (!) 161/72 SpO2: 100 %  EBL: None  Complications: No immediate post-treatment complications observed by team, or reported by patient.  Note: The patient tolerated  the entire procedure well. A repeat set of vitals were taken after the procedure and the patient was kept under observation following institutional policy, for this type of procedure. Post-procedural neurological assessment was performed, showing return to baseline, prior to discharge. The patient was provided with post-procedure discharge instructions, including a section on how to identify potential problems. Should any problems arise concerning this procedure, the patient was given instructions to immediately contact us, at any time, without hesitation. In any case, we plan to contact the patient by telephone for a follow-up status report regarding this interventional procedure.  Comments:  No additional relevant information.  Plan of Care  Orders:  Orders Placed This Encounter  Procedures   KNEE INJECTION    Local Anesthetic & Steroid injection.    Scheduling Instructions:     Side(s): Bilateral Knee     Sedation: None     Timeframe: Today    Order Specific Question:   Where will this procedure be performed?    Answer:   [facility]   DG PAIN [facility] C-ARM 1-60 MIN NO REPORT    Intraoperative interpretation by procedural physician at [facility].    Standing Status:   Standing    Number of Occurrences:   1    Order Specific Question:   Reason for exam:    Answer:   Assistance in needle guidance and placement for procedures requiring needle placement in or near specific anatomical locations not easily accessible without such assistance.   MR KNEE LEFT WO CONTRAST    Standing Status:   Future    Standing Expiration Date:   07/31/2020    Order Specific Question:   What is the patient's sedation requirement?    Answer:   No Sedation    Order Specific Question:   Does the patient have a pacemaker or implanted devices?    Answer:   No    Order Specific Question:   Preferred  imaging location?    Answer:   [facility] (table limit-400lbs)    Order Specific  Question:   Radiology Contrast Protocol - do NOT remove file path    Answer:   \[email or web address]   MR KNEE RIGHT WO CONTRAST    Standing Status:   Future    Standing Expiration Date:   07/31/2020    Order Specific Question:   What is the patient's sedation requirement?    Answer:   No Sedation    Order Specific Question:   Does the patient have a pacemaker or implanted devices?    Answer:   No    Order Specific Question:   Preferred imaging location?    Answer:   [facility] (table limit-400lbs)    Order Specific Question:   Radiology Contrast Protocol - do NOT remove file path    Answer:   \[email or web address]   Ambulatory referral to Orthopedic Surgery    Referral Priority:   Routine    Referral Type:   Surgical    Referral Reason:   Specialty Services Required    Requested Specialty:   Orthopedic Surgery    Number of Visits Requested:   1   Informed Consent Details: Physician/Practitioner Attestation; Transcribe to consent form and obtain patient signature    Provider Attestation: I, [place] Dossie Arbour, MD, (Pain Management Specialist), the physician/practitioner, attest that I have discussed with the patient the benefits, risks, side effects, alternatives, likelihood of achieving goals and potential problems during recovery for the procedure that I have provided informed consent.    Scheduling Instructions:     Procedure: Bilateral intra-articular knee arthrocentesis (aspiration and/or injection)     Indications: Chronic bilateral knee pain secondary to knee arthropathy/arthralgia     Note: Always confirm laterality of pain with Ms. Montealegre, before procedure.     Transcribe to consent form and obtain patient signature.   Chronic Opioid Analgesic:  No opioid analgesics prescribed by our practice    Medications ordered for procedure: Meds ordered this encounter  Medications   lidocaine (XYLOCAINE) 2 % (with pres) injection  400 mg   methylPREDNISolone acetate (DEPO-MEDROL) injection 80 mg   methylPREDNISolone acetate (DEPO-MEDROL) injection 80 mg   ropivacaine (PF) 2 mg/mL (0.2%) (NAROPIN) injection 3 mL   ropivacaine (PF) 2 mg/mL (0.2%) (NAROPIN) injection 3 mL   gabapentin (NEURONTIN) 300 MG capsule    Sig: Take 1 capsule (300 mg total) by mouth 4 (four) times daily.    Dispense:  360 capsule    Refill:  1    Fill one day early if pharmacy is closed on scheduled refill date. May substitute for generic if available.   Medications administered: We administered lidocaine, methylPREDNISolone acetate, methylPREDNISolone acetate, ropivacaine (PF) 2 mg/mL (0.2%), and ropivacaine (PF) 2 mg/mL (0.2%).  See the medical record for exact dosing, route, and time of administration.  Follow-up plan:   Return in about 2 weeks (around 06/15/2019) for (VV), (PP).      Interventional management options: Planned, scheduled, and/or pending:   NOTE: NO RFA until BMI <35. 1. MRI of both knees. 2. Orthopedic surgery referral for possible bilateral total knee replacements   Considering:   Diagnosticbilateral genicular nerve block #1 Possible bilateral genicular nerve RFA Diagnosticleft side LESI Diagnostic bilateral lumbar facet RFA Diagnosticright suprascapular NB Possible right suprascapular RFA   Palliative PRN treatment(s):   Therapeutic/palliative bilateral IA knee injection #2(w/ steroid) Diagnosticbilateral lumbar facet nerve block #2 Therapeutic bilateral Hyalgan knee injections 2nd Series(under fluoro) (last  done 08/06/2018)  Diagnostic bilateral glenohumeral Joint (shoulder), AC joint, & subdeltoid bursaInjection#2    Recent Visits Date Type Provider Dept  06/01/19 Procedure visit Milinda Pointer, MD [facility]  Showing recent visits within past 90 days and meeting all other requirements   Future Appointments Date Type Provider Dept  06/21/19 Appointment Milinda Pointer, MD [facility]  Showing future appointments within next 90 days and meeting all other requirements   Disposition: Discharge home  Discharge Date & Time: 06/01/2019; 1345 hrs.   Primary Care Physician: Sofie Hartigan, MD Location: [facility] Note by: Gaspar Cola, MD Date: 06/01/2019; Time: 10:24 AM  Disclaimer:  Medicine is not an Chief Strategy Officer. The only guarantee in medicine is that nothing is guaranteed. It is important to note that the decision to proceed with this intervention was based on the information collected from the patient. The Data and conclusions were drawn from the patient's questionnaire, the interview, and the physical examination. Because the information was provided in large part by the patient, it cannot be guaranteed that it has not been purposely or unconsciously manipulated. Every effort has been made to obtain as much relevant data as possible for this evaluation. It is important to note that the conclusions that lead to this procedure are derived in large part from the available data. Always take into account that the treatment will also be dependent on availability of resources and existing treatment guidelines, considered by other Pain Management Practitioners as being common knowledge and practice, at the time of the intervention. For Medico-Legal purposes, it is also important to point out that variation in procedural techniques and pharmacological choices are the acceptable norm. The indications, contraindications, technique, and results of the above procedure should only be interpreted and judged by a Board-Certified Interventional Pain Specialist with extensive familiarity and expertise in the same exact procedure and technique.

## 2019-06-02 ENCOUNTER — Telehealth: Payer: Self-pay | Admitting: Pain Medicine

## 2019-06-02 ENCOUNTER — Telehealth: Payer: Self-pay

## 2019-06-02 MED ORDER — GABAPENTIN 300 MG PO CAPS: 300.0000 mg | ORAL_CAPSULE | Freq: Four times a day (QID) | ORAL | 1 refills | Status: DC

## 2019-06-02 NOTE — Telephone Encounter (Signed)
Patient states she is doinag well from procedure.  States she needs a refill on her gabapentin. Will send message to Dr Dossie Arbour.

## 2019-06-02 NOTE — Addendum Note (Signed)
Addended by: Milinda Pointer A on: 06/02/2019 10:25 AM   Modules accepted: Orders

## 2019-06-02 NOTE — Telephone Encounter (Signed)
Post procedure phone call.  LM 

## 2019-06-02 NOTE — Telephone Encounter (Signed)
Patient returned call to [facility] / fu to procedure States Dr. Dossie Arbour was supposed to fill medications as well. Please check this and call patient.

## 2019-06-13 ENCOUNTER — Emergency Department
Admission: EM | Admit: 2019-06-13 | Discharge: 2019-06-13 | Disposition: A | Discharge status: Home / Self Care | Payer: Medicare Other | Attending: Emergency Medicine | Admitting: Emergency Medicine

## 2019-06-13 ENCOUNTER — Emergency Department: Payer: Medicare Other

## 2019-06-13 ENCOUNTER — Other Ambulatory Visit: Payer: Self-pay

## 2019-06-13 DIAGNOSIS — N183 Chronic kidney disease, stage 3 unspecified: Secondary | ICD-10-CM | POA: Diagnosis not present

## 2019-06-13 DIAGNOSIS — I129 Hypertensive chronic kidney disease with stage 1 through stage 4 chronic kidney disease, or unspecified chronic kidney disease: Secondary | ICD-10-CM | POA: Diagnosis not present

## 2019-06-13 DIAGNOSIS — R519 Headache, unspecified: Secondary | ICD-10-CM | POA: Insufficient documentation

## 2019-06-13 DIAGNOSIS — Z79899 Other long term (current) drug therapy: Secondary | ICD-10-CM | POA: Diagnosis not present

## 2019-06-13 MED ORDER — TRAMADOL HCL 50 MG PO TABS
50.0000 mg | ORAL_TABLET | Freq: Once | ORAL | Status: AC
  Administered 2019-06-13: 50 mg via ORAL
  Filled 2019-06-13: qty 1

## 2019-06-13 NOTE — ED Provider Notes (Signed)
[facility] Emergency Department Provider Note  ____________________________________________   First MD Initiated Contact with Patient 06/13/19 1055     (approximate)  I have reviewed the triage vital signs and the nursing notes.   HISTORY  Chief Complaint Headache    HPI Azka Wickenhauser is a 73 y.o. female presents emergency department complaining of a headache.  States started with a sharp headache last night.  Some vision problems last night.  Headache has eased off a little today.  She states she did see flashing bright lights last night.  History of hypertension but does not feel like a high blood pressure headache to her.  She has been taking her medications.  No vomiting.  No sensitivity to light.  No sensitivity to noise.  She did not take any Tylenol or ibuprofen prior to arrival.    Past Medical History:  Diagnosis Date  . Arthritis   . Hypertension   . Rheumatoid arthritis [facility])     Patient Active Problem List   Diagnosis Date Noted  . Bursitis of left shoulder 02/22/2019  . [place] arthritis 02/01/2019  . Carpal tunnel syndrome on right 10/14/2018  . Carpal tunnel syndrome, left 10/14/2018  . Neurogenic pain 03/02/2018  . Tricompartmental disease of knee (Bilateral) 02/04/2018  . Chronic low back pain (Secondary Area of Pain) (Bilateral) (L>R) 02/04/2018  . Lumbar facet syndrome (Bilateral) (L>R) 02/04/2018  . Spondylosis without myelopathy or radiculopathy, lumbosacral region 02/04/2018  . History of claustrophobia 01/05/2018  . History of panic attacks 01/05/2018  . Chronic shoulder pain (Bilateral) 01/05/2018  . Osteoarthritis of shoulder (Bilateral) 01/05/2018  . Osteoarthritis of knees (Bilateral) 01/05/2018  . Elevated C-reactive protein (CRP) 12/15/2017  . Chronic pain syndrome 12/11/2017  . Pharmacologic therapy 12/11/2017  . Disorder of skeletal system 12/11/2017  . Problems influencing health status 12/11/2017  . Chronic  knee pain (Primary Area of Pain) (Bilateral) (L>R) 12/11/2017  . Chronic lower extremity pain [facility] Area of Pain) (Left) 12/11/2017  . Chronic shoulder pain (Fourth Area of Pain) (Right) 12/11/2017  . Chronic hip pain (Left) 12/11/2017  . Fatty liver disease, nonalcoholic 123456  . DDD (degenerative disc disease), lumbar 06/02/2017  . Chronic low back pain ([place]) 04/22/2017  . CKD (chronic kidney disease) stage 3, GFR 30-59 ml/min 03/28/2017  . BMI 40.0-44.9, adult ([place]) 03/24/2017  . Elevated erythrocyte sedimentation rate 02/12/2017  . Osteoarthritis of knee (Left) 12/26/2016  . Shoulder bursitis (Right) 06/06/2016  . Encounter for long-term (current) use of high-risk medication 03/06/2016  . Long term current use of opiate analgesic 03/06/2016  . Chronic gout 02/26/2016  . Essential hypertension 02/26/2016  . Exercise-induced asthma 02/26/2016  . Fibromyalgia 02/26/2016  . GERD without esophagitis 02/26/2016  . IBS (irritable bowel syndrome) 02/26/2016  . Rheumatoid arthritis involving multiple sites ([place]) 02/26/2016  . Vitamin B12 deficiency 02/26/2016  . Vitamin D deficiency 02/26/2016    Past Surgical History:  Procedure Laterality Date  . ABDOMINAL HYSTERECTOMY    . ABDOMINAL HYSTERECTOMY    . [place], [place]  . BREAST BIOPSY Right 02/09/2019   affirm bx of calcs, ribbon marker, path pending  . CESAREAN SECTION    . CHOLECYSTECTOMY    . EYE SURGERY Bilateral 2015   cataract surgery, [place], [place]  . KNEE ARTHROSCOPY    . TONSILLECTOMY    . TUBAL LIGATION      Prior to Admission medications   Medication Sig Start  Date End Date Taking? Authorizing Provider  allopurinol (ZYLOPRIM) 300 MG tablet Take 300 mg by mouth daily.    [provider]  busPIRone (BUSPAR) 5 MG tablet Take 5 mg by mouth 3 (three) times daily.    [provider]  calcium-vitamin D 250-100 MG-UNIT tablet Take 1  tablet by mouth 2 (two) times daily.    [provider]  cholecalciferol (VITAMIN D) 400 units TABS tablet Take 2,000 Units by mouth.     [provider]  fluticasone (FLONASE) 50 MCG/ACT nasal spray Place 2 sprays into both nostrils daily.    [provider]  furosemide (LASIX) 10 MG/ML solution Take by mouth daily.    [provider]  gabapentin (NEURONTIN) 300 MG capsule Take 1 capsule (300 mg total) by mouth 4 (four) times daily. 06/02/19 11/29/19  Milinda Pointer, MD  losartan (COZAAR) 100 MG tablet Take 100 mg by mouth daily.    [provider]  lubiprostone (AMITIZA) 24 MCG capsule Take 24 mcg by mouth 2 (two) times daily with a meal.    [provider]  Magnesium 400 MG TABS Take by mouth.    [provider]  methotrexate (RHEUMATREX) 2.5 MG tablet Take 7.5 mg by mouth 3 (three) times a week.    [provider]  mupirocin ointment (BACTROBAN) 2 % Apply 1 application topically 3 (three) times daily. 09/29/17   Lorin Picket, PA-C  omeprazole (PRILOSEC) 20 MG capsule Take 20 mg by mouth daily.    [provider]  promethazine (PHENERGAN) 25 MG tablet Take 25 mg by mouth every 6 (six) hours as needed for nausea or vomiting.    [provider]  propranolol (INDERAL) 80 MG tablet Take 80 mg by mouth daily.    [provider]  sertraline (ZOLOFT) 100 MG tablet Take 100 mg by mouth daily.    [provider]  tiZANidine (ZANAFLEX) 4 MG capsule Take 4 mg by mouth 2 (two) times daily as needed for muscle spasms.    [provider]    Allergies Penicillins  Family History  Problem Relation Age of Onset  . Breast cancer Other 50  . Hypertension Mother   . Migraines Mother   . Other Father     Social History Social History   Tobacco Use  . Smoking status: Never Smoker  . Smokeless tobacco: Never Used  Substance Use Topics  . Alcohol use: No  . Drug use: No    Review  of Systems  Constitutional: No fever/chills, positive headache Eyes: No visual changes. ENT: No sore throat. Respiratory: Denies cough Genitourinary: Negative for dysuria. Musculoskeletal: Negative for back pain. Skin: Negative for rash.    ____________________________________________   PHYSICAL EXAM:  VITAL SIGNS: ED Triage Vitals  Enc Vitals Group     BP 06/13/19 1024 (!) 125/54     Pulse Rate 06/13/19 1024 63     Resp 06/13/19 1024 16     Temp 06/13/19 1024 99 F (37.2 C)     Temp Source 06/13/19 1024 Oral     SpO2 06/13/19 1024 97 %     Weight 06/13/19 1026 233 lb (105.7 kg)     Height 06/13/19 1026 5\' 4"  (1.626 m)     Head Circumference --      Peak Flow --      Pain Score 06/13/19 1025 6     Pain Loc --      Pain Edu? --  Excl. in [place]? --     Constitutional: Alert and oriented. Well appearing and in no acute distress. Eyes: Conjunctivae are normal. Perrl, eomi Head: Atraumatic. Nose: No congestion/rhinnorhea. Mouth/Throat: Mucous membranes are moist.   Neck:  supple no lymphadenopathy noted Cardiovascular: Normal rate, regular rhythm. Heart sounds are normal Respiratory: Normal respiratory effort.  No retractions, lungs c t a  GU: deferred Musculoskeletal: FROM all extremities, warm and well perfused Neurologic:  Normal speech and language.  Cranial nerves II through XII grossly intact, grips are equal bilaterally Skin:  Skin is warm, dry and intact. No rash noted. Psychiatric: Mood and affect are normal. Speech and behavior are normal.  ____________________________________________   LABS (all labs ordered are listed, but only abnormal results are displayed)  Labs Reviewed - No data to display ____________________________________________   ____________________________________________  RADIOLOGY  CT of the head is negative  ____________________________________________   PROCEDURES  Procedure(s) performed: Tramadol 1 p.o.   Procedures     ____________________________________________   INITIAL IMPRESSION / ASSESSMENT AND PLAN / ED COURSE  Pertinent labs & imaging results that were available during my care of the patient were reviewed by me and considered in my medical decision making (see chart for details).   Patient 73 year old female presents emergency department with headache.  See HPI  Physical exam patient appears well.  Vitals are basically normal.  Remainder the exam is basically unremarkable.  CT of the head due to the patient's age and headache.   CT of the head is negative.  Patient states she got relief with tramadol.  She was given headache instructions and told to return if worsening.  She states she understands will comply.  She is discharged stable condition.   Ondine Francis Eddinger was evaluated in Emergency Department on 06/13/2019 for the symptoms described in the history of present illness. She was evaluated in the context of the global COVID-19 pandemic, which necessitated consideration that the patient might be at risk for infection with the SARS-CoV-2 virus that causes COVID-19. Institutional protocols and algorithms that pertain to the evaluation of patients at risk for COVID-19 are in a state of rapid change based on information released by regulatory bodies including the CDC and federal and state organizations. These policies and algorithms were followed during the patient's care in the ED.   As part of my medical decision making, I reviewed the following data within the [facility] notes reviewed and incorporated, Old chart reviewed, Radiograph reviewed CT the head is negative, Notes from prior ED visits and [place] Controlled Substance Database  ____________________________________________   FINAL CLINICAL IMPRESSION(S) / ED DIAGNOSES  Final diagnoses:  Acute headache with normal neurologic examination      NEW MEDICATIONS STARTED DURING THIS VISIT:  Discharge Medication  List as of 06/13/2019 12:26 PM       Note:  This document was prepared using Dragon voice recognition software and may include unintentional dictation errors.    Versie Starks, PA-C 06/13/19 1505    Harvest Dark, MD 06/13/19 [phone]

## 2019-06-13 NOTE — Discharge Instructions (Addendum)
Follow-up with your regular doctor if not better in 3 days.  Return emergency department worsening.  Take Tylenol and ibuprofen for pain as needed.

## 2019-06-13 NOTE — ED Notes (Signed)
See triage note  Presents with h/a    States she had a sharp pain to back of head last pm   States she had some vision problems last pm  States headache as eased off slightly

## 2019-06-13 NOTE — ED Triage Notes (Signed)
Pt c/o left sided HA since last night, denies any other sx at this time. Has not taken anything for pain today. Pt is a/ox4, ambulatory to triage with a steady gait.

## 2019-06-17 ENCOUNTER — Encounter: Payer: Self-pay | Admitting: Pain Medicine

## 2019-06-17 NOTE — Progress Notes (Signed)
Pain relief after procedure (treated area only): (Questions asked to patient) 1. Starting about 15 minutes after the procedure, and "while the area was still numb" (from the local anesthetics), were you having any of your usual pain "in that area"? (NOT including the discomfort from the needle sticks.) First 1 hour:100 % better. First 4-6 hours: 100% better. 2. Assuming that it did get numb. How long was the area numb? 100 % benefit, longer than 6 hours. How long?1 day 3. How much better is your pain now, when compared to before the procedure? Current benefit: 80 % better. 4. Can you move better now? Improvement in ROM (Range of Motion): Yes. 5. Can you do more now? Improvement in function: Yes. 4. Did you have any problems with the procedure? Side-effects/Complications: No.

## 2019-06-18 ENCOUNTER — Ambulatory Visit: Payer: Medicare Other | Admitting: Orthopedic Surgery

## 2019-06-20 NOTE — Progress Notes (Signed)
Pain Management Virtual Encounter Note - Virtual Visit via Telephone Telehealth (real-time audio visits between healthcare provider and patient).   Patient's Phone No. & Preferred Pharmacy:  [phone] (home); [phone] (mobile); (Preferred) [phone] [email or web address]  [facility] - [place], [place] - [address] [place] [address] Phone: [phone] Fax: [phone]    Pre-screening note:  Our staff contacted [name] and offered her an "in person", "face-to-face" appointment versus a telephone encounter. She indicated preferring the telephone encounter, at this time.   Reason for Virtual Visit: COVID-19*  Social distancing based on CDC and AMA recommendations.   I contacted [name] on 06/21/2019 via telephone.      I clearly identified myself as Sabrina Cola, MD. I verified that I was speaking with the correct person using two identifiers (Name: Sabrina Green, and date of birth: 1945/09/22).  Advanced Informed Consent I sought verbal advanced consent from [place] for virtual visit interactions. I informed [name] of possible security and privacy concerns, risks, and limitations associated with providing "not-in-person" medical evaluation and management services. I also informed Sabrina Green of the availability of "in-person" appointments. Finally, I informed her that there would be a charge for the virtual visit and that she could be  personally, fully or partially, financially responsible for it. Sabrina Green expressed understanding and agreed to proceed.   Historic Elements   Ms. Sabrina Green is a 73 y.o. year old, female patient evaluated today after her last encounter by our practice on 06/02/2019. Sabrina Green  has a past medical history of Arthritis, Hypertension, and Rheumatoid arthritis ([place]). She also  has a past surgical history that includes Tonsillectomy; Cesarean section; Tubal ligation;  Abdominal hysterectomy; Knee arthroscopy; Cholecystectomy; Abdominal hysterectomy; Ankle fracture surgery (Right, 1989); Eye surgery (Bilateral, 2015); and Breast biopsy (Right, 02/09/2019). [name] has a current medication list which includes the following prescription(s): allopurinol, buspirone, cholecalciferol, fluticasone, gabapentin, losartan, lubiprostone, magnesium, methotrexate, mupirocin ointment, omeprazole, promethazine, propranolol, sertraline, and tizanidine. She  reports that she has never smoked. She has never used smokeless tobacco. She reports that she does not drink alcohol or use drugs. Ms. Dec is allergic to penicillins.   HPI  Today, she is being contacted for a post-procedure assessment.  Post-Procedure Evaluation  Procedure: Diagnostic bilateral intra-articular knee joint injection #2 with steroids, no sedation Pre-procedure pain level:  4/10 Post-procedure: 0/10 (100% relief)  Sedation: None.  Sabrina Martins, RN  06/17/2019  3:06 PM  Sign when Signing Visit Pain relief after procedure (treated area only): (Questions asked to patient) 1. Starting about 15 minutes after the procedure, and "while the area was still numb" (from the local anesthetics), were you having any of your usual pain "in that area"? (NOT including the discomfort from the needle sticks.) First 1 hour:100 % better. First 4-6 hours: 100% better. 2. Assuming that it did get numb. How long was the area numb? 100 % benefit, longer than 6 hours. How long?1 day 3. How much better is your pain now, when compared to before the procedure? Current benefit: 80 % better. 4. Can you move better now? Improvement in ROM (Range of Motion): Yes. 5. Can you do more now? Improvement in function: Yes. 4. Did you have any problems with the procedure? Side-effects/Complications: No.   Current benefits: Defined as benefit that persist at this time.   Analgesia:  >75% relief Function: Sabrina Green reports  improvement in function ROM: [name]  reports improvement in ROM  Pharmacotherapy Assessment  Analgesic: No opioid analgesics prescribed by our practice    Monitoring: Pharmacotherapy: No side-effects or adverse reactions reported. [place] PMP: PDMP reviewed during this encounter.       Compliance: No problems identified. Effectiveness: Clinically acceptable. Plan: Refer to "POC".  UDS:  Summary  Date Value Ref Range Status  12/11/2017 FINAL  Final    Comment:    ==================================================================== TOXASSURE COMP DRUG ANALYSIS,UR ==================================================================== Test                             Result       Flag       Units Drug Present and Declared for Prescription Verification   Gabapentin                     PRESENT      EXPECTED   Propranolol                    PRESENT      EXPECTED Drug Present not Declared for Prescription Verification   Acetaminophen                  PRESENT      UNEXPECTED   Diphenhydramine                PRESENT      UNEXPECTED Drug Absent but Declared for Prescription Verification   Tizanidine                     Not Detected UNEXPECTED    Tizanidine, as indicated in the declared medication list, is not    always detected even when used as directed.   Sertraline                     Not Detected UNEXPECTED   Promethazine                   Not Detected UNEXPECTED ==================================================================== Test                      Result    Flag   Units      Ref Range   Creatinine              117              mg/dL      >=20 ==================================================================== Declared Medications:  The flagging and interpretation on this report are based on the  following declared medications.  Unexpected results may arise from  inaccuracies in the declared medications.  **Note: The testing scope of this panel includes these medications:   Gabapentin (Neurontin)  Promethazine (Phenergan)  Propranolol (Inderal)  Sertraline (Zoloft)  **Note: The testing scope of this panel does not include small to  moderate amounts of these reported medications:  Tizanidine (Zanaflex)  **Note: The testing scope of this panel does not include following  reported medications:  Allopurinol  Calcium (Calcium/Vitamin D)  Doxycycline (Vibramycin)  Estradiol (Estrace)  Fluticasone (Flonase)  Furosemide (Lasix)  Losartan (Cozaar)  Lubiprostone (Amitiza)  Methotrexate (Rheumatrex)  Mupirocin (Bactroban)  Omeprazole (Prilosec)  Vitamin D  Vitamin D (Calcium/Vitamin D) ==================================================================== For clinical consultation, please call [phone]. ====================================================================    Laboratory Chemistry Profile (12 mo)  Renal: No results found for requested labs within last 8760 hours.  Lab Results  Component Value Date  GFRAA 39 (L) 12/11/2017   GFRNONAA 34 (L) 12/11/2017   Hepatic: No results found for requested labs within last 8760 hours. Lab Results  Component Value Date   AST 14 12/11/2017   Other: No results found for requested labs within last 8760 hours. Note: Above Lab results reviewed.  Imaging  Last 90 days:  Ct Head Wo Contrast  Result Date: 06/13/2019 CLINICAL DATA:  Acute, severe headache, the worst of the patient's life. This consists of sharp left posterior pain. EXAM: CT HEAD WITHOUT CONTRAST TECHNIQUE: Contiguous axial images were obtained from the base of the skull through the vertex without intravenous contrast. COMPARISON:  09/07/2017 FINDINGS: Brain: Mildly enlarged ventricles and cortical sulci. No intracranial hemorrhage, mass lesion or CT evidence of acute infarction. Vascular: No hyperdense vessel or unexpected calcification. Skull: Mild bilateral hyperostosis frontalis Sinuses/Orbits: Status post bilateral cataract extraction.  Unremarkable bones and included paranasal sinuses. Other: None. IMPRESSION: No acute abnormality. Mild diffuse cerebral and cerebellar atrophy. Electronically Signed   By: Claudie Revering M.D.   On: 06/13/2019 12:00   [facility] C-arm 1-60 Min No Report  Result Date: 06/01/2019 Fluoro was used, but no Radiologist interpretation will be provided. Please refer to "NOTES" tab for provider progress note.   Assessment  The primary encounter diagnosis was Chronic pain syndrome. Diagnoses of Chronic knee pain (Primary Area of Pain) (Bilateral) (L>R), Chronic low back pain (Secondary Area of Pain) (Bilateral) (L>R), Chronic lower extremity pain (Tertiary Area of Pain) (Left), and Chronic shoulder pain (Fourth Area of Pain) (Right) were also pertinent to this visit.  Plan of Care  I have discontinued Blanch Media A. Demasi's furosemide and calcium-vitamin D. I am also having her maintain her allopurinol, losartan, methotrexate, cholecalciferol, mupirocin ointment, lubiprostone, sertraline, propranolol, fluticasone, tiZANidine, omeprazole, promethazine, Magnesium, busPIRone, and gabapentin.  Pharmacotherapy (Medications Ordered): No orders of the defined types were placed in this encounter.  Orders:  Orders Placed This Encounter  Procedures  . Hyalgan Knee Inj. (PRN)    For knee pain. Hyalgan Knee injection(s). Please order hyalgan to be available.    Standing Status:   Standing    Number of Occurrences:   5    Standing Expiration Date:   06/20/2020    Scheduling Instructions:     Procedure: Intra-articular Hyalgan Knee injection #1     (Fluoroscopy required)     Side(s): Bilateral Knee     Sedation: None     TIMEFRAME: PRN procedure. (Ms. Kross will call when needed.)    Order Specific Question:   Where will this procedure be performed?    Answer:   [facility]   Follow-up plan:   No follow-ups on file.      Interventional management options: Planned, scheduled, and/or pending:    NOTE: NO RFA until BMI <35.    Considering:   Diagnosticbilateral genicular nerve block #1 Possible bilateral genicular nerve RFA Diagnosticleft side LESI Diagnostic bilateral lumbar facet RFA Diagnosticright suprascapular NB Possible right suprascapular RFA   Palliative PRN treatment(s):   Therapeutic/palliative bilateral IA knee injection #2(w/ steroid) Diagnosticbilateral lumbar facet nerve block #2 Therapeutic bilateral Hyalgan knee inj. #S2N1 (last done 08/06/2018) (fluoroscopy required) Diagnostic bilateral glenohumeral Joint (shoulder), AC joint, & subdeltoid bursaInjection#2    Recent Visits Date Type Provider Dept  06/01/19 Procedure visit Milinda Pointer, MD [facility]  Showing recent visits within past 90 days and meeting all other requirements   Today's Visits Date Type Provider Dept  06/21/19 Telemedicine Milinda Pointer,  MD [facility]  Showing today's visits and meeting all other requirements   Future Appointments No visits were found meeting these conditions.  Showing future appointments within next 90 days and meeting all other requirements   I discussed the assessment and treatment plan with the patient. The patient was provided an opportunity to ask questions and all were answered. The patient agreed with the plan and demonstrated an understanding of the instructions.  Patient advised to call back or seek an in-person evaluation if the symptoms or condition worsens.  Total duration of non-face-to-face encounter: 13 minutes.  Note by: Sabrina Cola, MD Date: 06/21/2019; Time: 3:05 PM  Note: This dictation was prepared with Dragon dictation. Any transcriptional errors that may result from this process are unintentional.  Disclaimer:  * Given the special circumstances of the COVID-19 pandemic, the federal government has announced that the Office for Civil Rights (OCR) will exercise its enforcement discretion  and will not impose penalties on physicians using telehealth in the event of noncompliance with regulatory requirements under the [place] (HIPAA) in connection with the good faith provision of telehealth during the XX123456 national public health emergency. ([place])

## 2019-06-21 ENCOUNTER — Ambulatory Visit: Payer: Medicare Other | Attending: Pain Medicine | Admitting: Pain Medicine

## 2019-06-21 ENCOUNTER — Other Ambulatory Visit: Payer: Self-pay

## 2019-06-21 DIAGNOSIS — M25562 Pain in left knee: Secondary | ICD-10-CM

## 2019-06-21 DIAGNOSIS — G894 Chronic pain syndrome: Secondary | ICD-10-CM

## 2019-06-21 DIAGNOSIS — M545 Low back pain, unspecified: Secondary | ICD-10-CM

## 2019-06-21 DIAGNOSIS — M79605 Pain in left leg: Secondary | ICD-10-CM | POA: Diagnosis not present

## 2019-06-21 DIAGNOSIS — M25561 Pain in right knee: Secondary | ICD-10-CM

## 2019-06-21 DIAGNOSIS — M25511 Pain in right shoulder: Secondary | ICD-10-CM

## 2019-06-21 DIAGNOSIS — G8929 Other chronic pain: Secondary | ICD-10-CM

## 2019-06-23 ENCOUNTER — Ambulatory Visit: Payer: Medicare Other | Admitting: Orthopedic Surgery

## 2019-07-01 ENCOUNTER — Other Ambulatory Visit: Payer: Self-pay | Admitting: Surgery

## 2019-07-01 DIAGNOSIS — M1712 Unilateral primary osteoarthritis, left knee: Secondary | ICD-10-CM

## 2019-07-08 ENCOUNTER — Ambulatory Visit
Admission: RE | Admit: 2019-07-08 | Discharge: 2019-07-08 | Disposition: A | Discharge status: Home / Self Care | Payer: Medicare Other | Source: Ambulatory Visit | Attending: Surgery | Admitting: Surgery

## 2019-07-08 ENCOUNTER — Other Ambulatory Visit: Payer: Self-pay

## 2019-07-08 DIAGNOSIS — M1712 Unilateral primary osteoarthritis, left knee: Secondary | ICD-10-CM | POA: Insufficient documentation

## 2019-08-16 DIAGNOSIS — M159 Polyosteoarthritis, unspecified: Secondary | ICD-10-CM | POA: Insufficient documentation

## 2019-08-16 DIAGNOSIS — M150 Primary generalized (osteo)arthritis: Secondary | ICD-10-CM | POA: Insufficient documentation

## 2019-08-17 ENCOUNTER — Other Ambulatory Visit: Payer: Self-pay

## 2019-08-17 ENCOUNTER — Ambulatory Visit: Payer: Medicare Other | Attending: Pain Medicine | Admitting: Pain Medicine

## 2019-08-17 ENCOUNTER — Telehealth: Payer: Self-pay | Admitting: *Deleted

## 2019-08-17 DIAGNOSIS — M792 Neuralgia and neuritis, unspecified: Secondary | ICD-10-CM

## 2019-08-17 DIAGNOSIS — M25562 Pain in left knee: Secondary | ICD-10-CM | POA: Diagnosis not present

## 2019-08-17 DIAGNOSIS — M25511 Pain in right shoulder: Secondary | ICD-10-CM

## 2019-08-17 DIAGNOSIS — M79605 Pain in left leg: Secondary | ICD-10-CM

## 2019-08-17 DIAGNOSIS — G8929 Other chronic pain: Secondary | ICD-10-CM

## 2019-08-17 DIAGNOSIS — M545 Low back pain: Secondary | ICD-10-CM | POA: Diagnosis not present

## 2019-08-17 DIAGNOSIS — M25561 Pain in right knee: Secondary | ICD-10-CM

## 2019-08-17 DIAGNOSIS — M170 Bilateral primary osteoarthritis of knee: Secondary | ICD-10-CM

## 2019-08-17 DIAGNOSIS — M797 Fibromyalgia: Secondary | ICD-10-CM

## 2019-08-17 DIAGNOSIS — M159 Polyosteoarthritis, unspecified: Secondary | ICD-10-CM

## 2019-08-17 DIAGNOSIS — M8949 Other hypertrophic osteoarthropathy, multiple sites: Secondary | ICD-10-CM

## 2019-08-17 MED ORDER — GABAPENTIN 300 MG PO CAPS: 300.0000 mg | ORAL_CAPSULE | Freq: Four times a day (QID) | ORAL | 1 refills | Status: DC

## 2019-08-17 NOTE — Patient Instructions (Addendum)
____________________________________________________________________________________________  Preparing for your procedure (without sedation)  Procedure appointments are limited to planned procedures: . No Prescription Refills. . No disability issues will be discussed. . No medication changes will be discussed.  Instructions: . Oral Intake: Do not eat or drink anything for at least 3 hours prior to your procedure. . Transportation: Unless otherwise stated by your physician, you may drive yourself after the procedure. . Blood Pressure Medicine: Take your blood pressure medicine with a sip of water the morning of the procedure. . Blood thinners: Notify our staff if you are taking any blood thinners. Depending on which one you take, there will be specific instructions on how and when to stop it. . Diabetics on insulin: Notify the staff so that you can be scheduled 1st case in the morning. If your diabetes requires high dose insulin, take only  of your normal insulin dose the morning of the procedure and notify the staff that you have done so. . Preventing infections: Shower with an antibacterial soap the morning of your procedure.  . Build-up your immune system: Take 1000 mg of Vitamin C with every meal (3 times a day) the day prior to your procedure. . Antibiotics: Inform the staff if you have a condition or reason that requires you to take antibiotics before dental procedures. . Pregnancy: If you are pregnant, call and cancel the procedure. . Sickness: If you have a cold, fever, or any active infections, call and cancel the procedure. . Arrival: You must be in the facility at least 30 minutes prior to your scheduled procedure. . Children: Do not bring any children with you. . Dress appropriately: Bring dark clothing that you would not mind if they get stained. . Valuables: Do not bring any jewelry or valuables.  Reasons to call and reschedule or cancel your procedure: (Following these  recommendations will minimize the risk of a serious complication.) . Surgeries: Avoid having procedures within 2 weeks of any surgery. (Avoid for 2 weeks before or after any surgery). . Flu Shots: Avoid having procedures within 2 weeks of a flu shots or . (Avoid for 2 weeks before or after immunizations). . Barium: Avoid having a procedure within 7-10 days after having had a radiological study involving the use of radiological contrast. (Myelograms, Barium swallow or enema study). . Heart attacks: Avoid any elective procedures or surgeries for the initial 6 months after a "Myocardial Infarction" (Heart Attack). . Blood thinners: It is imperative that you stop these medications before procedures. Let us know if you if you take any blood thinner.  . Infection: Avoid procedures during or within two weeks of an infection (including chest colds or gastrointestinal problems). Symptoms associated with infections include: Localized redness, fever, chills, night sweats or profuse sweating, burning sensation when voiding, cough, congestion, stuffiness, runny nose, sore throat, diarrhea, nausea, vomiting, cold or Flu symptoms, recent or current infections. It is specially important if the infection is over the area that we intend to treat. . Heart and lung problems: Symptoms that may suggest an active cardiopulmonary problem include: cough, chest pain, breathing difficulties or shortness of breath, dizziness, ankle swelling, uncontrolled high or unusually low blood pressure, and/or palpitations. If you are experiencing any of these symptoms, cancel your procedure and contact your primary care physician for an evaluation.  Remember:  Regular Business hours are:  Monday to Thursday 8:00 AM to 4:00 PM  Provider's Schedule: Oney Tatlock, MD:  Procedure days: Tuesday and Thursday 7:30 AM to 4:00 PM  [place]   Holley Raring, MD:  Procedure days: Monday and Wednesday 7:30 AM to 4:00  PM ____________________________________________________________________________________________    ____________________________________________________________________________________________  Initial Gabapentin Titration  Medication used: Gabapentin (Generic Name) or Neurontin (Brand Name) 300 mg tablets/capsules  Note:  1. It takes 2-3 weeks of taking the medication regularly to reach steady levels. 2. Medication needs to be taken regularly in order for it to work. The medication is meant to prevent pain and therefore, taking it only when you think you need it will not work. 3. Medication is meant to prevent sharp shooting pains, electrical-like sensations, deep aching pain, and burning sensation associated with neuropathic/neurogenic pain.  Reasons to stop increasing the dose:  Reason 1: You get good relief of symptoms, in which case there is no need to increase the daily dose any further.    Reason 2: You develop some side effects, such as sleeping all of the time, difficulty concentrating, or becoming disoriented, in which case you need to go down on the dose, to the prior level, where you were not experiencing any side effects. Stay on that dose longer, to allow more time for your body to get use it, before attempting to increase it again.    Endpoint: Once you have reached the maximum dose you can tolerate without side-effects, contact your physician so as to evaluate the results of the regimen.     Questions: Feel free to contact us for any questions or problems at [phone]   Note: You should be currently taking 1 tablet 4 times a day (approximately every 6 hours).  Continue taking that regimen and add to your regimen as follows:  Steps: Step 1: Start by taking 2 (two) tablet at bedtime for at least 7-14 days.  Step 2: After being on 2 (two) tablet for 7-14 days, then increase it to 3 (three) tablets at bedtime for another  7-14 days.  Step 3: Next, after being on 3  (three) tablets at bedtime for  7-14 days, then begin increasing your daytime regimen by 1 additional daily tablet every 7 to 14 days.  Add that increased to that portion of the day that you feel requires it the most.  Do not ever take more than 3 tablets at a time.  Consider being on a regiment of taking 3 tablets at bedtime and 2 tablets 3 times a day, before thinking about increasing any of the daytime regimens to 3 tablets.  Once again, make changes to the total daily dose every 7 to 14 days.  Making changes any sooner may cause you to feel very sleepy and groggy.  If at any time this occurred, go down to the lower dose that did not cause this side effect and stay on that dose longer, until your body gets used to the new regimen.  Do not go any higher than 3 tablets every 6 hours.  ____________________________________________________________________________________________

## 2019-08-17 NOTE — Progress Notes (Signed)
Patient: Sabrina Green  Service Category: E/M  Provider: Gaspar Cola, MD  DOB: [birth year]  DOS: 08/17/2019  Location: Office  MRN: [id number]  Setting: Ambulatory outpatient  Referring Provider: Sofie Hartigan, MD  Type: Established Patient  Specialty: Interventional Pain Management  PCP: Sofie Hartigan, MD  Location: Remote location  Delivery: TeleHealth     Virtual Encounter - Pain Management PROVIDER NOTE: Information contained herein reflects review and annotations entered in association with encounter. Interpretation of such information and data should be left to medically-trained personnel. Information provided to patient can be located elsewhere in the medical record under "Patient Instructions". Document created using STT-dictation technology, any transcriptional errors that may result from process are unintentional.    Contact & Pharmacy Preferred: [phone] Home: [phone] (home) Mobile: [phone] (mobile) E-mail: [email or web address]  [facility] - [place], [place] - [address] [place] [address] Phone: [phone] Fax: [phone]   Pre-screening  Sabrina Green "in-person" vs "virtual" encounter. She indicated preferring virtual for this encounter.   Reason COVID-19*  Social distancing based on CDC and AMA recommendations.   I contacted [name] on 08/17/2019 via telephone.      I clearly identified myself as Gaspar Cola, MD. I verified that I was speaking with the correct person using two identifiers (Name: Sabrina Green, and date of birth: Sabrina Green, Sabrina Green).  Consent I sought verbal advanced consent from [name] for virtual visit interactions. I informed Sabrina Green of possible security and privacy concerns, risks, and limitations associated with providing "not-in-person" medical evaluation and management services. I also informed Sabrina Green of the availability of "in-person"  appointments. Finally, I informed her that there would be a charge for the virtual visit and that she could be  personally, fully or partially, financially responsible for it. Sabrina Green expressed understanding and agreed to proceed.   Historic Elements   Sabrina Green is a 74 y.o. year old, female patient evaluated today after her last encounter by our practice on 06/02/2019. Sabrina Green  has a past medical history of Arthritis, Hypertension, and Rheumatoid arthritis ([place]). She also  has a past surgical history that includes Tonsillectomy; Cesarean section; Tubal ligation; Abdominal hysterectomy; Knee arthroscopy; Cholecystectomy; Abdominal hysterectomy; Ankle fracture surgery (Right, 1989); Eye surgery (Bilateral, 2015); and Breast biopsy (Right, 02/09/2019). Sabrina Green has a current medication list which includes the following prescription(s): allopurinol, buspirone, cholecalciferol, diclofenac sodium, fluticasone, gabapentin, losartan, lubiprostone, magnesium, methotrexate, mupirocin ointment, omeprazole, propranolol, sertraline, and tizanidine. She  reports that she has never smoked. She has never used smokeless tobacco. She reports that she does not drink alcohol or use drugs. Sabrina Green is allergic to penicillins.   HPI  Today, she is being contacted for worsening of previously known (established) problem.  The patient indicates that the surgery that she had planned for total knee replacement has been canceled due to the COVID-19 pandemic.  Because of this, she would like to schedule some palliative treatments until she can get this problem solved.  In addition, she indicates that the gabapentin 300 mg 4 times a day is not really covering her pain.  I asked her if she was having any side effects to it and she indicated that she is not.  Therefore today we will go ahead and send a prescription to the pharmacy so that she can start going up slowly to 900 mg 4 times daily.  The patient was also  provided  with instructions on how to accomplish this in the patient instruction section of the electronic medical record.  I also went over those instructions verbally so as to make sure that she understood them.  Even though the prescription that we had written for her to take gabapentin 300 mg was written so that she would take it every 6 hours, she indicates that she has been taking 2 tablets twice daily.  Clearly 600 mg at the time is not causing any problems and therefore I am confident that she should be able to tolerate the increase.  I also went ahead and offer the patient a genicular nerve block for the purpose of perhaps doing a radiofrequency ablation, but she indicated that she is nervous about "nerve blocks" and she would like to try the Hyalgan knee injections first.  If pain chance they fail, she indicated that she will revisit the idea of the genicular nerve blocks.  Pharmacotherapy Assessment  Analgesic: No opioid analgesics prescribed by our practice    Monitoring: Pharmacotherapy: No side-effects or adverse reactions reported. [place] PMP: PDMP reviewed during this encounter.       Compliance: No problems identified. Effectiveness: Clinically acceptable. Plan: Refer to "POC".  UDS:  Summary  Date Value Ref Range Status  12/11/2017 FINAL  Final    Comment:    ==================================================================== TOXASSURE COMP DRUG ANALYSIS,UR ==================================================================== Test                             Result       Flag       Units Drug Present and Declared for Prescription Verification   Gabapentin                     PRESENT      EXPECTED   Propranolol                    PRESENT      EXPECTED Drug Present not Declared for Prescription Verification   Acetaminophen                  PRESENT      UNEXPECTED   Diphenhydramine                PRESENT      UNEXPECTED Drug Absent but Declared for Prescription Verification    Tizanidine                     Not Detected UNEXPECTED    Tizanidine, as indicated in the declared medication list, is not    always detected even when used as directed.   Sertraline                     Not Detected UNEXPECTED   Promethazine                   Not Detected UNEXPECTED ==================================================================== Test                      Result    Flag   Units      Ref Range   Creatinine              117              mg/dL      >=20 ==================================================================== Declared Medications:  The flagging and interpretation on this report are based on  the  following declared medications.  Unexpected results may arise from  inaccuracies in the declared medications.  **Note: The testing scope of this panel includes these medications:  Gabapentin (Neurontin)  Promethazine (Phenergan)  Propranolol (Inderal)  Sertraline (Zoloft)  **Note: The testing scope of this panel does not include small to  moderate amounts of these reported medications:  Tizanidine (Zanaflex)  **Note: The testing scope of this panel does not include following  reported medications:  Allopurinol  Calcium (Calcium/Vitamin D)  Doxycycline (Vibramycin)  Estradiol (Estrace)  Fluticasone (Flonase)  Furosemide (Lasix)  Losartan (Cozaar)  Lubiprostone (Amitiza)  Methotrexate (Rheumatrex)  Mupirocin (Bactroban)  Omeprazole (Prilosec)  Vitamin D  Vitamin D (Calcium/Vitamin D) ==================================================================== For clinical consultation, please call [phone]. ====================================================================    Laboratory Chemistry Profile (12 mo)  Renal: No results found for requested labs within last 8760 hours.  Lab Results  Component Value Date   GFRAA 39 (L) 12/11/2017   GFRNONAA 34 (L) 12/11/2017   Hepatic: No results found for requested labs within last 8760 hours. Lab Results   Component Value Date   AST 14 12/11/2017   Other: No results found for requested labs within last 8760 hours. Note: Above Lab results reviewed.  Imaging  CT KNEE LEFT WO CONTRAST CLINICAL DATA:  Preop left knee replacement  EXAM: CT OF THE LEFT KNEE WITHOUT CONTRAST  TECHNIQUE: Multidetector CT imaging of the LEFT knee was performed according to the standard protocol. Multiplanar CT image reconstructions were also generated.  COMPARISON:  None.  FINDINGS: Bones/Joint/Cartilage  No fracture or dislocation. Normal alignment. Generalized osteopenia. Small joint effusion.  Mild osteoarthritis the left SI joint.  Severe lateral femorotibial compartment osteoarthritis with marginal osteophytes. Moderate patellofemoral compartment and medial femorotibial compartment osteoarthritis with marginal osteophytes.  No aggressive osseous lesion.  Ligaments  Ligaments are suboptimally evaluated by CT.  Muscles and Tendons Muscles are normal. No muscle atrophy.  Soft tissue No fluid collection or hematoma. No soft tissue mass.  IMPRESSION: Tricompartmental osteoarthritis of the left knee most severe in the lateral femorotibial compartment.  Electronically Signed   By: Kathreen Devoid   On: 12/Green/2020 11:28   Assessment  The primary encounter diagnosis was Chronic knee pain (Primary Area of Pain) (Bilateral) (L>R). Diagnoses of Chronic low back pain (Secondary Area of Pain) (Bilateral) (L>R), Chronic lower extremity pain (Tertiary Area of Pain) (Left), Chronic shoulder pain (Fourth Area of Pain) (Right), Fibromyalgia, Neurogenic pain, Osteoarthritis of knees (Bilateral), and Primary osteoarthritis involving multiple joints were also pertinent to this visit.  Plan of Care  Problem-specific:  No problem-specific Assessment & Plan notes found for this encounter.  I have discontinued Blanch Media A. Brauer's promethazine. I have also changed her gabapentin. Additionally, I am having her  maintain her allopurinol, losartan, cholecalciferol, mupirocin ointment, lubiprostone, sertraline, propranolol, fluticasone, tiZANidine, omeprazole, Magnesium, busPIRone, diclofenac Sodium, and methotrexate.  Pharmacotherapy (Medications Ordered): Meds ordered this encounter  Medications  . gabapentin (NEURONTIN) 300 MG capsule    Sig: Take 1-3 capsules (300-900 mg total) by mouth 4 (four) times daily. Follow written titration schedule.    Dispense:  1080 capsule    Refill:  1    Fill one day early if pharmacy is closed on scheduled refill date. May substitute for generic if available.   Orders:  Orders Placed This Encounter  Procedures  . KNEE INJECTION    Hyalgan knee injection. Please order Hyalgan.    Standing Status:   Future    Standing Expiration Date:  09/17/2019    Scheduling Instructions:     Procedure: Intra-articular Hyalgan Knee injection #1     Side: Bilateral     Sedation: None     Timeframe: ASAA    Order Specific Question:   Where will this procedure be performed?    Answer:   [facility]   Follow-up plan:   Return for Procedure (no sedation): (B) Hyalgan #1.      Interventional management options: Planned, scheduled, and/or pending:   NOTE: NO RFA until BMI <35.    Considering:   Diagnosticbilateral genicular nerve block #1 Possible bilateral genicular nerve RFA Diagnosticleft side LESI Diagnostic bilateral lumbar facet RFA Diagnosticright suprascapular NB Possible right suprascapular RFA   Palliative PRN treatment(s):   Therapeutic/palliative bilateral IA knee injection #2(w/ steroid) Diagnosticbilateral lumbar facet nerve block #2 Therapeutic bilateral Hyalgan knee inj. #S2N1 (last done 08/06/2018) (fluoroscopy required) Diagnostic bilateral glenohumeral Joint (shoulder), AC joint, & subdeltoid bursaInjection#2    Recent Visits Date Type Provider Dept  06/21/19 Telemedicine Milinda Pointer, MD [facility]   06/01/19 Procedure visit Milinda Pointer, MD [facility]  Showing recent visits within past 90 days and meeting all other requirements   Today's Visits Date Type Provider Dept  08/17/19 Telemedicine Milinda Pointer, MD [facility]  Showing today's visits and meeting all other requirements   Future Appointments No visits were found meeting these conditions.  Showing future appointments within next 90 days and meeting all other requirements   I discussed the assessment and treatment plan with the patient. The patient was provided an opportunity to ask questions and all were answered. The patient agreed with the plan and demonstrated an understanding of the instructions.  Patient advised to call back or seek an in-person evaluation if the symptoms or condition worsens.  Duration of encounter: 16 minutes.  Note by: Gaspar Cola, MD Date: 08/17/2019; Time: 9:02 AM

## 2019-09-08 ENCOUNTER — Other Ambulatory Visit: Payer: Self-pay | Admitting: Surgery

## 2019-09-09 ENCOUNTER — Other Ambulatory Visit: Payer: Self-pay

## 2019-09-09 ENCOUNTER — Ambulatory Visit: Payer: Medicare Other | Attending: Pain Medicine | Admitting: Pain Medicine

## 2019-09-09 ENCOUNTER — Encounter: Payer: Self-pay | Admitting: Pain Medicine

## 2019-09-09 VITALS — BP 153/62 | HR 59 | Temp 97.3°F | Resp 16 | Ht 65.0 in | Wt 233.0 lb

## 2019-09-09 DIAGNOSIS — M25562 Pain in left knee: Secondary | ICD-10-CM | POA: Diagnosis present

## 2019-09-09 DIAGNOSIS — G8929 Other chronic pain: Secondary | ICD-10-CM | POA: Insufficient documentation

## 2019-09-09 DIAGNOSIS — M25561 Pain in right knee: Secondary | ICD-10-CM | POA: Diagnosis present

## 2019-09-09 DIAGNOSIS — M170 Bilateral primary osteoarthritis of knee: Secondary | ICD-10-CM | POA: Insufficient documentation

## 2019-09-09 MED ORDER — SODIUM HYALURONATE (VISCOSUP) 20 MG/2ML IX SOSY
2.0000 mL | PREFILLED_SYRINGE | Freq: Once | INTRA_ARTICULAR | Status: AC
  Administered 2019-09-09: 2 mL via INTRA_ARTICULAR

## 2019-09-09 MED ORDER — ROPIVACAINE HCL 2 MG/ML IJ SOLN
5.0000 mL | Freq: Once | INTRAMUSCULAR | Status: AC
  Administered 2019-09-09: 5 mL via INTRA_ARTICULAR
  Filled 2019-09-09: qty 10

## 2019-09-09 MED ORDER — LIDOCAINE HCL (PF) 1 % IJ SOLN
5.0000 mL | Freq: Once | INTRAMUSCULAR | Status: AC
  Administered 2019-09-09: 5 mL
  Filled 2019-09-09: qty 5

## 2019-09-09 NOTE — Progress Notes (Signed)
PROVIDER NOTE: Information contained herein reflects review and annotations entered in association with encounter. Interpretation of such information and data should be left to medically-trained personnel. Information provided to patient can be located elsewhere in the medical record under "Patient Instructions". Document created using STT-dictation technology, any transcriptional errors that may result from process are unintentional.    Patient: Sabrina Green  Service Category: Procedure  Provider: Gaspar Cola, MD  DOB: [birth year]  DOS: 09/09/2019  Location: [facility]  MRN: [id number]  Setting: Ambulatory - outpatient  Referring Provider: Sofie Hartigan, MD  Type: Established Patient  Specialty: Interventional Pain Management  PCP: Sofie Hartigan, MD   Primary Reason for Visit: Interventional Pain Management Treatment. CC: Knee Pain (bilateral )  Procedure:          Anesthesia, Analgesia, Anxiolysis:  Type: Therapeutic Intra-Articular Hyalgan Knee Injection #1  Region: Lateral infrapatellar Knee Region Level: Knee Joint Laterality: Bilateral  Type: Local Anesthesia Indication(s): Analgesia         Local Anesthetic: Lidocaine 1-2% Route: Infiltration (McRae/IM) IV Access: Declined Sedation: Declined   Position: Sitting   Indications: 1. Chronic knee pain (Primary Area of Pain) (Bilateral) (L>R)   2. Osteoarthritis of knees (Bilateral)   3. Tricompartmental disease of knee (Bilateral)    Pain Score: Pre-procedure: 1 /10 Post-procedure: 1 /10   Pre-op Assessment:  Ms. Tangney is a 74 y.o. (year old), female patient, seen today for interventional treatment. She  has a past surgical history that includes Tonsillectomy; Cesarean section; Tubal ligation; Abdominal hysterectomy; Knee arthroscopy; Cholecystectomy; Abdominal hysterectomy; Ankle fracture surgery (Right, 1989); Eye surgery (Bilateral, 2015); and Breast biopsy (Right, 02/09/2019). Ms. Debarge has  a current medication list which includes the following prescription(s): abatacept, allopurinol, buspirone, chlorthalidone, diclofenac sodium, fluticasone, gabapentin, hydrocodone-acetaminophen, losartan, lubiprostone, methotrexate, mupirocin ointment, omeprazole, propranolol, sertraline, tizanidine, cholecalciferol, and magnesium. Her primarily concern today is the Knee Pain (bilateral )  Initial Vital Signs:  Pulse/HCG Rate: (!) 59  Temp: (!) 97.3 F (36.3 C) Resp: 16 BP: (!) 153/62 SpO2: 100 %  BMI: Estimated body mass index is 38.77 kg/m as calculated from the following:   Height as of this encounter: 5\' 5"  (1.651 m).   Weight as of this encounter: 233 lb (105.7 kg).  Risk Assessment: Allergies: Reviewed. She is allergic to penicillins.  Allergy Precautions: None required Coagulopathies: Reviewed. None identified.  Blood-thinner therapy: None at this time Active Infection(s): Reviewed. None identified. Ms. Pondexter is afebrile  Site Confirmation: Ms. Harpin was asked to confirm the procedure and laterality before marking the site Procedure checklist: Completed Consent: Before the procedure and under the influence of no sedative(s), amnesic(s), or anxiolytics, the patient was informed of the treatment options, risks and possible complications. To fulfill our ethical and legal obligations, as recommended by the American Medical Association's Code of Ethics, I have informed the patient of my clinical impression; the nature and purpose of the treatment or procedure; the risks, benefits, and possible complications of the intervention; the alternatives, including doing nothing; the risk(s) and benefit(s) of the alternative treatment(s) or procedure(s); and the risk(s) and benefit(s) of doing nothing. The patient was provided information about the general risks and possible complications associated with the procedure. These may include, but are not limited to: failure to achieve desired goals,  infection, bleeding, organ or nerve damage, allergic reactions, paralysis, and death. In addition, the patient was informed of those risks and complications associated to the procedure, such as failure to decrease  pain; infection; bleeding; organ or nerve damage with subsequent damage to sensory, motor, and/or autonomic systems, resulting in permanent pain, numbness, and/or weakness of one or several areas of the body; allergic reactions; (i.e.: anaphylactic reaction); and/or death. Furthermore, the patient was informed of those risks and complications associated with the medications. These include, but are not limited to: allergic reactions (i.e.: anaphylactic or anaphylactoid reaction(s)); adrenal axis suppression; blood sugar elevation that in diabetics may result in ketoacidosis or comma; water retention that in patients with history of congestive heart failure may result in shortness of breath, pulmonary edema, and decompensation with resultant heart failure; weight gain; swelling or edema; medication-induced neural toxicity; particulate matter embolism and blood vessel occlusion with resultant organ, and/or nervous system infarction; and/or aseptic necrosis of one or more joints. Finally, the patient was informed that Medicine is not an exact science; therefore, there is also the possibility of unforeseen or unpredictable risks and/or possible complications that may result in a catastrophic outcome. The patient indicated having understood very clearly. We have given the patient no guarantees and we have made no promises. Enough time was given to the patient to ask questions, all of which were answered to the patient's satisfaction. Ms. Langlie has indicated that she wanted to continue with the procedure. Attestation: I, the ordering provider, attest that I have discussed with the patient the benefits, risks, side-effects, alternatives, likelihood of achieving goals, and potential problems during recovery  for the procedure that I have provided informed consent. Date  Time: 09/09/2019  9:19 AM  Pre-Procedure Preparation:  Monitoring: As per clinic protocol. Respiration, ETCO2, SpO2, BP, heart rate and rhythm monitor placed and checked for adequate function Safety Precautions: Patient was assessed for positional comfort and pressure points before starting the procedure. Time-out: I initiated and conducted the "Time-out" before starting the procedure, as per protocol. The patient was asked to participate by confirming the accuracy of the "Time Out" information. Verification of the correct person, site, and procedure were performed and confirmed by me, the nursing staff, and the patient. "Time-out" conducted as per Joint Commission's Universal Protocol (UP.01.01.01). Time: 0947  Description of Procedure:          Target Area: Knee Joint Approach: Just above the Lateral tibial plateau, lateral to the infrapatellar tendon. Area Prepped: Entire knee area, from the mid-thigh to the mid-shin. Prepping solution: DuraPrep (Iodine Povacrylex [0.7% available iodine] and Isopropyl Alcohol, 74% w/w) Safety Precautions: Aspiration looking for blood return was conducted prior to all injections. At no point did we inject any substances, as a needle was being advanced. No attempts were made at seeking any paresthesias. Safe injection practices and needle disposal techniques used. Medications properly checked for expiration dates. SDV (single dose vial) medications used. Description of the Procedure: Protocol guidelines were followed. The patient was placed in position over the fluoroscopy table. The target area was identified and the area prepped in the usual manner. Skin & deeper tissues infiltrated with local anesthetic. Appropriate amount of time allowed to pass for local anesthetics to take effect. The procedure needles were then advanced to the target area. Proper needle placement secured. Negative aspiration  confirmed. Solution injected in intermittent fashion, asking for systemic symptoms every 0.5cc of injectate. The needles were then removed and the area cleansed, making sure to leave some of the prepping solution back to take advantage of its long term bactericidal properties. Vitals:   09/09/19 0919 09/09/19 0920  BP:  (!) 153/62  Pulse:  (!) 59  Resp:  16  Temp: (!) 97.3 F (36.3 C)   TempSrc: Temporal   SpO2:  100%  Weight:  233 lb (105.7 kg)  Height:  5\' 5"  (1.651 m)    Start Time: 0947 hrs. End Time: 0952 hrs. Materials:  Needle(s) Type: Regular needle Gauge: 25G Length: 1.5-in Medication(s): Please see orders for medications and dosing details.  Imaging Guidance:          Type of Imaging Technique: None used Indication(s): N/A Exposure Time: No patient exposure Contrast: None used. Fluoroscopic Guidance: N/A Ultrasound Guidance: N/A Interpretation: N/A  Antibiotic Prophylaxis:   Anti-infectives (From admission, onward)   None     Indication(s): None identified  Post-operative Assessment:  Post-procedure Vital Signs:  Pulse/HCG Rate: (!) 59  Temp: (!) 97.3 F (36.3 C) Resp: 16 BP: (!) 153/62 SpO2: 100 %  EBL: None  Complications: No immediate post-treatment complications observed by team, or reported by patient.  Note: The patient tolerated the entire procedure well. A repeat set of vitals were taken after the procedure and the patient was kept under observation following institutional policy, for this type of procedure. Post-procedural neurological assessment was performed, showing return to baseline, prior to discharge. The patient was provided with post-procedure discharge instructions, including a section on how to identify potential problems. Should any problems arise concerning this procedure, the patient was given instructions to immediately contact us, at any time, without hesitation. In any case, we plan to contact the patient by telephone for a  follow-up status report regarding this interventional procedure.  Comments:  No additional relevant information.  Plan of Care  Orders:  Orders Placed This Encounter  Procedures  . KNEE INJECTION    Hyalgan knee injection to be done by MD.    Scheduling Instructions:     Procedure: Intra-articular Hyalgan Knee injection #1     Side(s): Bilateral Knee     Sedation: None     Timeframe: Today    Order Specific Question:   Where will this procedure be performed?    Answer:   [facility]  . KNEE INJECTION    Hyalgan knee injection. Please order Hyalgan.    Standing Status:   Future    Standing Expiration Date:   10/07/2019    Scheduling Instructions:     Procedure: Intra-articular Hyalgan Knee injection #2     Side: Bilateral     Sedation: None     Timeframe: in two (2) weeks    Order Specific Question:   Where will this procedure be performed?    Answer:   [facility]  . Informed Consent Details: Physician/Practitioner Attestation; Transcribe to consent form and obtain patient signature    Provider Attestation: I, [place] Dossie Arbour, MD, (Pain Management Specialist), the physician/practitioner, attest that I have discussed with the patient the benefits, risks, side effects, alternatives, likelihood of achieving goals and potential problems during recovery for the procedure that I have provided informed consent.    Scheduling Instructions:     Procedure: Therapeutic, bilateral, intra-articular Hyalgan knee injection     Indications: Chronic bilateral knee pain secondary to primary osteoarthritis of the knee     Note: Always confirm laterality of pain with Ms. Sejour, before procedure.     Transcribe to consent form and obtain patient signature.  . Provide equipment / supplies at bedside    Equipment required: Single use, disposable, "Block Tray"    Standing Status:   Standing    Number of Occurrences:  1    Order Specific Question:   Specify    Answer:   Block  Tray   Chronic Opioid Analgesic:  No opioid analgesics prescribed by our practice    Medications ordered for procedure: Meds ordered this encounter  Medications  . lidocaine (PF) (XYLOCAINE) 1 % injection 5 mL  . ropivacaine (PF) 2 mg/mL (0.2%) (NAROPIN) injection 5 mL  . Sodium Hyaluronate SOSY 2 mL  . Sodium Hyaluronate SOSY 2 mL   Medications administered: We administered lidocaine (PF), ropivacaine (PF) 2 mg/mL (0.2%), Sodium Hyaluronate, and Sodium Hyaluronate.  See the medical record for exact dosing, route, and time of administration.  Follow-up plan:   Return in about 2 weeks (around 09/23/2019) for Procedure (no sedation): (B) Hyalgan #2.       Interventional management options: Planned, scheduled, and/or pending:   NOTE: NO RFA until BMI <35.    Considering:   Diagnosticbilateral genicular nerve block #1 Possible bilateral genicular nerve RFA Diagnosticleft side LESI Diagnostic bilateral lumbar facet RFA Diagnosticright suprascapular NB Possible right suprascapular RFA   Palliative PRN treatment(s):   Therapeutic/palliative bilateral IA knee injection #2(w/ steroid) Diagnosticbilateral lumbar facet nerve block #2 Therapeutic bilateral Hyalgan knee inj. #S2N1 (last done 08/06/2018) (fluoroscopy required) Diagnostic bilateral glenohumeral Joint (shoulder), AC joint, & subdeltoid bursaInjection#2     Recent Visits Date Type Provider Dept  08/17/19 Telemedicine Milinda Pointer, MD [facility]  06/21/19 Telemedicine Milinda Pointer, MD [facility]  Showing recent visits within past 90 days and meeting all other requirements   Today's Visits Date Type Provider Dept  09/09/19 Procedure visit Milinda Pointer, MD [facility]  Showing today's visits and meeting all other requirements   Future Appointments Date Type Provider Dept  09/23/19 Appointment Milinda Pointer, MD [facility]  Showing future  appointments within next 90 days and meeting all other requirements   Disposition: Discharge home  Discharge (Date  Time): 09/09/2019; 0953 hrs.   Primary Care Physician: Sofie Hartigan, MD Location: [facility] Note by: Gaspar Cola, MD Date: 09/09/2019; Time: 10:54 AM  Disclaimer:  Medicine is not an Chief Strategy Officer. The only guarantee in medicine is that nothing is guaranteed. It is important to note that the decision to proceed with this intervention was based on the information collected from the patient. The Data and conclusions were drawn from the patient's questionnaire, the interview, and the physical examination. Because the information was provided in large part by the patient, it cannot be guaranteed that it has not been purposely or unconsciously manipulated. Every effort has been made to obtain as much relevant data as possible for this evaluation. It is important to note that the conclusions that lead to this procedure are derived in large part from the available data. Always take into account that the treatment will also be dependent on availability of resources and existing treatment guidelines, considered by other Pain Management Practitioners as being common knowledge and practice, at the time of the intervention. For Medico-Legal purposes, it is also important to point out that variation in procedural techniques and pharmacological choices are the acceptable norm. The indications, contraindications, technique, and results of the above procedure should only be interpreted and judged by a Board-Certified Interventional Pain Specialist with extensive familiarity and expertise in the same exact procedure and technique.

## 2019-09-09 NOTE — Patient Instructions (Signed)

## 2019-09-10 ENCOUNTER — Telehealth: Payer: Self-pay

## 2019-09-10 NOTE — Telephone Encounter (Signed)
Post procedure phone call.  LM 

## 2019-09-14 ENCOUNTER — Encounter
Admission: RE | Admit: 2019-09-14 | Discharge: 2019-09-14 | Disposition: A | Discharge status: Home / Self Care | Payer: Medicare Other | Source: Ambulatory Visit | Attending: Surgery | Admitting: Surgery

## 2019-09-14 ENCOUNTER — Other Ambulatory Visit: Payer: Self-pay

## 2019-09-14 DIAGNOSIS — Z01818 Encounter for other preprocedural examination: Secondary | ICD-10-CM | POA: Insufficient documentation

## 2019-09-14 DIAGNOSIS — Z20822 Contact with and (suspected) exposure to covid-19: Secondary | ICD-10-CM | POA: Diagnosis not present

## 2019-09-14 HISTORY — DX: Chronic pain syndrome: G89.4

## 2019-09-14 HISTORY — DX: Fibromyalgia: M79.7

## 2019-09-14 HISTORY — DX: Anemia, unspecified: D64.9

## 2019-09-14 HISTORY — DX: Gout, unspecified: M10.9

## 2019-09-14 HISTORY — DX: Other complications of anesthesia, initial encounter: T88.59XA

## 2019-09-14 HISTORY — DX: Chronic kidney disease, stage 3 unspecified: N18.30

## 2019-09-14 HISTORY — DX: Gastro-esophageal reflux disease without esophagitis: K21.9

## 2019-09-14 HISTORY — DX: Deficiency of other specified B group vitamins: E53.8

## 2019-09-14 HISTORY — DX: Anxiety disorder, unspecified: F41.9

## 2019-09-14 HISTORY — DX: Other cervical disc degeneration, unspecified cervical region: M50.30

## 2019-09-14 HISTORY — DX: Vitamin D deficiency, unspecified: E55.9

## 2019-09-14 HISTORY — DX: Unspecified hearing loss, unspecified ear: H91.90

## 2019-09-14 NOTE — Patient Instructions (Addendum)
INSTRUCTIONS FOR SURGERY     Your surgery is scheduled for:   Tuesday, February 23RD, 2021     To find out your arrival time for the day of surgery,          please call [phone] between 1 pm and 3 pm on :   Monday, February 22ND, 2021     When you arrive for surgery, report to the [place].       Do NOT stop on the first floor to register.    REMEMBER: Instructions that are not followed completely may result in serious medical risk,  up to and including death, or upon the discretion of your surgeon and anesthesiologist,            your surgery may need to be rescheduled.  __X__ 1. Do not eat food after midnight the night before your procedure.                    No gum, candy, lozenger, tic tacs, tums or hard candies.                  ABSOLUTELY NOTHING SOLID IN YOUR MOUTH AFTER MIDNIGHT                    You may drink unlimited clear liquids up to 2 hours before you are scheduled to arrive for surgery.                   Do not drink anything within those 2 hours unless you need to take medicine, then take the                   smallest amount you need.  Clear liquids include:  water, apple juice without pulp,                   any flavor Gatorade, Black coffee, black tea.  Sugar may be added but no dairy/ honey /lemon.                        Broth and jello is not considered a clear liquid.  __x__  2. On the morning of surgery, please brush your teeth with toothpaste and water. You may rinse with                  mouthwash if you wish but DO NOT SWALLOW TOOTHPASTE OR MOUTHWASH  __X___3. NO alcohol for 24 hours before or after surgery.  __x___ 4.  Do NOT smoke or use e-cigarettes for 24 HOURS PRIOR TO SURGERY.                      DO NOT Use any chewable tobacco products for at least 6 hours prior to surgery.  __x___ 5. If you start any new medication after this appointment and prior to surgery,  please                   Bring it with you on the day of surgery.  ___x__ 6. Notify your doctor if there is  any change in your medical condition, such as fever,                   infection, vomitting, diarrhea or any open sores.  __x___ 7.  USE the CHG SOAP as instructed, the night before surgery and the day of surgery.                   Once you have washed with this soap, do NOT use any of the following: Powders, perfumes                    or lotions. Please do not wear make up, hairpins, clips or nail polish. You MAY wear deodorant.                   Men may shave their face and neck.  Women need to shave 48 hours prior to surgery.                   DO NOT wear ANY jewelry on the day of surgery. If there are rings that are too tight to                    remove easily, please address this prior to the surgery day. Piercings need to be removed.                                                                     NO METAL ON YOUR BODY.                    Do NOT bring any valuables.  If you came to Pre-Admit testing then you will not need license,                     insurance card or credit card.  If you will be staying overnight, please either leave your things in                     the car or have your family be responsible for these items.                     [facility] IS NOT RESPONSIBLE FOR BELONGINGS OR VALUABLES.  ___X__ 8. DO NOT wear contact lenses on surgery day.  You may not have dentures,                     Hearing aides, contacts or glasses in the operating room. These items can be                    Placed in the Recovery Room to receive immediately after surgery.  ___x__ 10. Take the following medications on the morning of surgery with a sip of water:                              1.  ALLOPURINOL                     2.  BUSPIRONE  3.  GABAPENTIN                     4.  PRILOSEC                     5.  INDERAL                     6.  ZOLOFT                      7.  FLONASE NASAL SPRAY                     8.  NORCO (if needed)  __x___ 11.  Follow any instructions provided to you by your surgeon.                        Such as enema, clear liquid bowel prep                     PLEASE DRINK THE PROTEIN DRINK ON THE MORNING OF SURGERY.                      HAVE IT FINISHED BY TWO HOURS PRIOR TO ARRIVAL AT [place].  __X__  12. STOP  ASPIRIN AS OF:  Today, February 16th                       THIS INCLUDES BC POWDERS / GOODIES POWDER  __x___ 13. STOP Anti-inflammatories as of:  TODAY, February 16TH                      This includes IBUPROFEN / MOTRIN / ADVIL / ALEVE/ NAPROXYN / VOLTAREN GEL                    YOU MAY TAKE TYLENOL ANY TIME PRIOR TO SURGERY.  _____ 15. Bring your CPAP machine into preop with you on the morning of surgery.  __X____17.  Continue to take the following medications but do not take on the morning of surgery:                       LOSARTAN //  AMITIZA // VITAMINS B //  ZANAFLEX // FOLIC ACID  [id number]. If staying overnight, please have appropriate shoes to wear to be able to walk around the unit.                   Wear clean and comfortable clothing to the hospital.  Remember to bring your cell phone and charger. Have sturdy shoes while inpatient. Bring phone number of person that Dr. Roland Rack should call after surgery.  CONTINUE TAKING HYGROTON AS USUAL AT NIGHT

## 2019-09-14 NOTE — Pre-Procedure Instructions (Signed)
Patient lives alone and is unsure if she will be able to find someone to stay with her when she arrives home after surgery.  She may consider going into a rehab facility.  Provided instruction booklet, "Total Joint Replacement", along with instructions on use of Incentive Spirometer and a protein drink.

## 2019-09-17 ENCOUNTER — Encounter
Admission: RE | Admit: 2019-09-17 | Discharge: 2019-09-17 | Disposition: A | Discharge status: Home / Self Care | Payer: Medicare Other | Source: Ambulatory Visit | Attending: Surgery | Admitting: Surgery

## 2019-09-17 ENCOUNTER — Other Ambulatory Visit: Payer: Self-pay

## 2019-09-17 DIAGNOSIS — Z01810 Encounter for preprocedural cardiovascular examination: Secondary | ICD-10-CM

## 2019-09-17 DIAGNOSIS — Z01818 Encounter for other preprocedural examination: Secondary | ICD-10-CM | POA: Diagnosis not present

## 2019-09-17 LAB — CBC WITH DIFFERENTIAL/PLATELET
Abs Immature Granulocytes: 0.04 10*3/uL (ref 0.00–0.07)
Basophils Absolute: 0 10*3/uL (ref 0.0–0.1)
Basophils Relative: 0 %
Eosinophils Absolute: 0.2 10*3/uL (ref 0.0–0.5)
Eosinophils Relative: 3 %
HCT: 38.3 % (ref 36.0–46.0)
Hemoglobin: 12 g/dL (ref 12.0–15.0)
Immature Granulocytes: 1 %
Lymphocytes Relative: 34 %
Lymphs Abs: 2 10*3/uL (ref 0.7–4.0)
MCH: 28.7 pg (ref 26.0–34.0)
MCHC: 31.3 g/dL (ref 30.0–36.0)
MCV: 91.6 fL (ref 80.0–100.0)
Monocytes Absolute: 0.4 10*3/uL (ref 0.1–1.0)
Monocytes Relative: 6 %
Neutro Abs: 3.4 10*3/uL (ref 1.7–7.7)
Neutrophils Relative %: 56 %
Platelets: 119 10*3/uL — ABNORMAL LOW (ref 150–400)
RBC: 4.18 MIL/uL (ref 3.87–5.11)
RDW: 14.9 % (ref 11.5–15.5)
WBC: 6 10*3/uL (ref 4.0–10.5)
nRBC: 0 % (ref 0.0–0.2)

## 2019-09-17 LAB — COMPREHENSIVE METABOLIC PANEL
ALT: 13 U/L (ref 0–44)
AST: 17 U/L (ref 15–41)
Albumin: 3.6 g/dL (ref 3.5–5.0)
Alkaline Phosphatase: 87 U/L (ref 38–126)
Anion gap: 9 (ref 5–15)
BUN: 23 mg/dL (ref 8–23)
CO2: 27 mmol/L (ref 22–32)
Calcium: 9.1 mg/dL (ref 8.9–10.3)
Chloride: 104 mmol/L (ref 98–111)
Creatinine, Ser: 1.23 mg/dL — ABNORMAL HIGH (ref 0.44–1.00)
GFR calc Af Amer: 51 mL/min — ABNORMAL LOW (ref >60)
GFR calc non Af Amer: 44 mL/min — ABNORMAL LOW (ref >60)
Glucose, Bld: 89 mg/dL (ref 70–99)
Potassium: 4.1 mmol/L (ref 3.5–5.1)
Sodium: 140 mmol/L (ref 135–145)
Total Bilirubin: 0.7 mg/dL (ref 0.3–1.2)
Total Protein: 7.2 g/dL (ref 6.5–8.1)

## 2019-09-17 LAB — SURGICAL PCR SCREEN
MRSA, PCR: NEGATIVE
Staphylococcus aureus: NEGATIVE

## 2019-09-17 LAB — URINALYSIS, ROUTINE W REFLEX MICROSCOPIC
Bilirubin Urine: NEGATIVE
Glucose, UA: NEGATIVE mg/dL
Hgb urine dipstick: NEGATIVE
Ketones, ur: NEGATIVE mg/dL
Leukocytes,Ua: NEGATIVE
Nitrite: NEGATIVE
Protein, ur: NEGATIVE mg/dL
Specific Gravity, Urine: 1.019 (ref 1.005–1.030)
pH: 5 (ref 5.0–8.0)

## 2019-09-17 LAB — TYPE AND SCREEN
ABO/RH(D): O POS
Antibody Screen: NEGATIVE

## 2019-09-17 LAB — PROTIME-INR
INR: 1.1 (ref 0.8–1.2)
Prothrombin Time: 14.2 seconds (ref 11.4–15.2)

## 2019-09-17 LAB — SARS CORONAVIRUS 2 (TAT 6-24 HRS): SARS Coronavirus 2: NEGATIVE

## 2019-09-17 LAB — APTT: aPTT: 37 seconds — ABNORMAL HIGH (ref 24–36)

## 2019-09-20 MED ORDER — CLINDAMYCIN PHOSPHATE 900 MG/50ML IV SOLN
900.0000 mg | INTRAVENOUS | Status: AC
  Administered 2019-09-21: 900 mg via INTRAVENOUS

## 2019-09-21 ENCOUNTER — Inpatient Hospital Stay: Payer: Medicare Other

## 2019-09-21 ENCOUNTER — Inpatient Hospital Stay
Admission: RE | Admit: 2019-09-21 | Discharge: 2019-09-24 | DRG: 470 | Disposition: A | Discharge status: Skilled Nursing Facility | Payer: Medicare Other | Source: Ambulatory Visit | Attending: Surgery | Admitting: Surgery

## 2019-09-21 ENCOUNTER — Other Ambulatory Visit: Payer: Self-pay

## 2019-09-21 ENCOUNTER — Encounter: Payer: Self-pay | Admitting: Surgery

## 2019-09-21 ENCOUNTER — Encounter
Admission: RE | Disposition: A | Discharge status: Skilled Nursing Facility | Payer: Self-pay | Source: Ambulatory Visit | Attending: Surgery

## 2019-09-21 DIAGNOSIS — M19012 Primary osteoarthritis, left shoulder: Secondary | ICD-10-CM | POA: Diagnosis present

## 2019-09-21 DIAGNOSIS — M19011 Primary osteoarthritis, right shoulder: Secondary | ICD-10-CM | POA: Diagnosis present

## 2019-09-21 DIAGNOSIS — N183 Chronic kidney disease, stage 3 unspecified: Secondary | ICD-10-CM | POA: Diagnosis present

## 2019-09-21 DIAGNOSIS — N179 Acute kidney failure, unspecified: Secondary | ICD-10-CM | POA: Diagnosis not present

## 2019-09-21 DIAGNOSIS — E538 Deficiency of other specified B group vitamins: Secondary | ICD-10-CM | POA: Diagnosis present

## 2019-09-21 DIAGNOSIS — Z79899 Other long term (current) drug therapy: Secondary | ICD-10-CM | POA: Diagnosis not present

## 2019-09-21 DIAGNOSIS — Z880 Allergy status to penicillin: Secondary | ICD-10-CM

## 2019-09-21 DIAGNOSIS — M21379 Foot drop, unspecified foot: Secondary | ICD-10-CM | POA: Diagnosis not present

## 2019-09-21 DIAGNOSIS — H9190 Unspecified hearing loss, unspecified ear: Secondary | ICD-10-CM | POA: Diagnosis present

## 2019-09-21 DIAGNOSIS — K219 Gastro-esophageal reflux disease without esophagitis: Secondary | ICD-10-CM | POA: Diagnosis present

## 2019-09-21 DIAGNOSIS — M797 Fibromyalgia: Secondary | ICD-10-CM | POA: Diagnosis present

## 2019-09-21 DIAGNOSIS — M5030 Other cervical disc degeneration, unspecified cervical region: Secondary | ICD-10-CM | POA: Diagnosis present

## 2019-09-21 DIAGNOSIS — M1712 Unilateral primary osteoarthritis, left knee: Secondary | ICD-10-CM | POA: Diagnosis present

## 2019-09-21 DIAGNOSIS — M1A9XX0 Chronic gout, unspecified, without tophus (tophi): Secondary | ICD-10-CM | POA: Diagnosis present

## 2019-09-21 DIAGNOSIS — N1832 Chronic kidney disease, stage 3b: Secondary | ICD-10-CM | POA: Diagnosis present

## 2019-09-21 DIAGNOSIS — I129 Hypertensive chronic kidney disease with stage 1 through stage 4 chronic kidney disease, or unspecified chronic kidney disease: Secondary | ICD-10-CM | POA: Diagnosis present

## 2019-09-21 DIAGNOSIS — Z96652 Presence of left artificial knee joint: Secondary | ICD-10-CM

## 2019-09-21 DIAGNOSIS — Z20822 Contact with and (suspected) exposure to covid-19: Secondary | ICD-10-CM | POA: Diagnosis present

## 2019-09-21 DIAGNOSIS — G894 Chronic pain syndrome: Secondary | ICD-10-CM | POA: Diagnosis present

## 2019-09-21 DIAGNOSIS — M069 Rheumatoid arthritis, unspecified: Secondary | ICD-10-CM | POA: Diagnosis present

## 2019-09-21 DIAGNOSIS — E559 Vitamin D deficiency, unspecified: Secondary | ICD-10-CM | POA: Diagnosis present

## 2019-09-21 HISTORY — PX: TOTAL KNEE ARTHROPLASTY: SHX125

## 2019-09-21 LAB — ABO/RH: ABO/RH(D): O POS

## 2019-09-21 SURGERY — ARTHROPLASTY, KNEE, TOTAL
Anesthesia: General | Site: Knee | Laterality: Left

## 2019-09-21 MED ORDER — PHENYLEPHRINE HCL (PRESSORS) 10 MG/ML IV SOLN
INTRAVENOUS | Status: DC | PRN
  Administered 2019-09-21: 100 ug via INTRAVENOUS

## 2019-09-21 MED ORDER — GABAPENTIN 300 MG PO CAPS
300.0000 mg | ORAL_CAPSULE | Freq: Four times a day (QID) | ORAL | Status: DC
  Administered 2019-09-21 – 2019-09-24 (×10): 300 mg via ORAL
  Filled 2019-09-21 (×7): qty 1
  Filled 2019-09-21: qty 3
  Filled 2019-09-21 (×3): qty 1

## 2019-09-21 MED ORDER — ACETAMINOPHEN 10 MG/ML IV SOLN
INTRAVENOUS | Status: DC | PRN
  Administered 2019-09-21: 1000 mg via INTRAVENOUS

## 2019-09-21 MED ORDER — LIDOCAINE HCL (CARDIAC) PF 100 MG/5ML IV SOSY
PREFILLED_SYRINGE | INTRAVENOUS | Status: DC | PRN
  Administered 2019-09-21: 60 mg via INTRAVENOUS

## 2019-09-21 MED ORDER — OXYCODONE HCL 5 MG PO TABS
ORAL_TABLET | ORAL | Status: AC
  Administered 2019-09-21: 5 mg via ORAL
  Filled 2019-09-21: qty 1

## 2019-09-21 MED ORDER — ACETAMINOPHEN 10 MG/ML IV SOLN
INTRAVENOUS | Status: AC
  Filled 2019-09-21: qty 100

## 2019-09-21 MED ORDER — ALBUTEROL SULFATE HFA 108 (90 BASE) MCG/ACT IN AERS
INHALATION_SPRAY | RESPIRATORY_TRACT | Status: DC | PRN
  Administered 2019-09-21: 6 via RESPIRATORY_TRACT

## 2019-09-21 MED ORDER — CLINDAMYCIN PHOSPHATE 900 MG/50ML IV SOLN
900.0000 mg | Freq: Four times a day (QID) | INTRAVENOUS | Status: AC
  Administered 2019-09-21 – 2019-09-22 (×3): 900 mg via INTRAVENOUS
  Filled 2019-09-21 (×3): qty 50

## 2019-09-21 MED ORDER — LUBIPROSTONE 24 MCG PO CAPS
24.0000 ug | ORAL_CAPSULE | Freq: Two times a day (BID) | ORAL | Status: DC | PRN
  Filled 2019-09-21: qty 1

## 2019-09-21 MED ORDER — SODIUM CHLORIDE 0.9 % IV SOLN
INTRAVENOUS | Status: DC | PRN
  Administered 2019-09-21: 60 mL

## 2019-09-21 MED ORDER — MIDAZOLAM HCL 2 MG/2ML IJ SOLN
INTRAMUSCULAR | Status: DC | PRN
  Administered 2019-09-21: 2 mg via INTRAVENOUS

## 2019-09-21 MED ORDER — SERTRALINE HCL 50 MG PO TABS
100.0000 mg | ORAL_TABLET | Freq: Every day | ORAL | Status: DC
  Administered 2019-09-22 – 2019-09-24 (×3): 100 mg via ORAL
  Filled 2019-09-21 (×3): qty 2

## 2019-09-21 MED ORDER — PANTOPRAZOLE SODIUM 40 MG PO TBEC
40.0000 mg | DELAYED_RELEASE_TABLET | Freq: Every day | ORAL | Status: DC
  Administered 2019-09-22 – 2019-09-24 (×3): 40 mg via ORAL
  Filled 2019-09-21 (×3): qty 1

## 2019-09-21 MED ORDER — DIPHENHYDRAMINE HCL 12.5 MG/5ML PO ELIX: 12.5000 mg | ORAL_SOLUTION | ORAL | Status: DC | PRN

## 2019-09-21 MED ORDER — PROPOFOL 10 MG/ML IV BOLUS
INTRAVENOUS | Status: DC | PRN
  Administered 2019-09-21: 150 mg via INTRAVENOUS

## 2019-09-21 MED ORDER — SODIUM CHLORIDE 0.9 % IV SOLN: INTRAVENOUS | Status: DC

## 2019-09-21 MED ORDER — BUSPIRONE HCL 10 MG PO TABS: 5.0000 mg | ORAL_TABLET | Freq: Four times a day (QID) | ORAL | Status: DC | PRN

## 2019-09-21 MED ORDER — ONDANSETRON HCL 4 MG PO TABS
4.0000 mg | ORAL_TABLET | Freq: Four times a day (QID) | ORAL | Status: DC | PRN
  Administered 2019-09-22 – 2019-09-23 (×3): 4 mg via ORAL
  Filled 2019-09-21 (×3): qty 1

## 2019-09-21 MED ORDER — TRANEXAMIC ACID 1000 MG/10ML IV SOLN
INTRAVENOUS | Status: DC | PRN
  Administered 2019-09-21: 1000 mg via TOPICAL

## 2019-09-21 MED ORDER — ONDANSETRON HCL 4 MG/2ML IJ SOLN
INTRAMUSCULAR | Status: DC | PRN
  Administered 2019-09-21: 4 mg via INTRAVENOUS

## 2019-09-21 MED ORDER — OXYCODONE HCL 5 MG PO TABS: 5.0000 mg | ORAL_TABLET | Freq: Once | ORAL | Status: AC | PRN

## 2019-09-21 MED ORDER — CHLORTHALIDONE 25 MG PO TABS
12.5000 mg | ORAL_TABLET | Freq: Every day | ORAL | Status: DC
  Administered 2019-09-22 – 2019-09-23 (×2): 12.5 mg via ORAL
  Filled 2019-09-21 (×4): qty 0.5

## 2019-09-21 MED ORDER — BISACODYL 10 MG RE SUPP: 10.0000 mg | Freq: Every day | RECTAL | Status: DC | PRN

## 2019-09-21 MED ORDER — KETOROLAC TROMETHAMINE 15 MG/ML IJ SOLN
7.5000 mg | Freq: Four times a day (QID) | INTRAMUSCULAR | Status: AC
  Administered 2019-09-21 – 2019-09-22 (×3): 7.5 mg via INTRAVENOUS
  Filled 2019-09-21 (×3): qty 1

## 2019-09-21 MED ORDER — FENTANYL CITRATE (PF) 100 MCG/2ML IJ SOLN
INTRAMUSCULAR | Status: AC
  Filled 2019-09-21: qty 2

## 2019-09-21 MED ORDER — METOCLOPRAMIDE HCL 5 MG/ML IJ SOLN: 5.0000 mg | Freq: Three times a day (TID) | INTRAMUSCULAR | Status: DC | PRN

## 2019-09-21 MED ORDER — EPHEDRINE SULFATE 50 MG/ML IJ SOLN
INTRAMUSCULAR | Status: DC | PRN
  Administered 2019-09-21: 5 mg via INTRAVENOUS
  Administered 2019-09-21: 10 mg via INTRAVENOUS
  Administered 2019-09-21: 5 mg via INTRAVENOUS

## 2019-09-21 MED ORDER — PROMETHAZINE HCL 25 MG/ML IJ SOLN
INTRAMUSCULAR | Status: AC
  Administered 2019-09-21: 12.5 mg via INTRAVENOUS
  Filled 2019-09-21: qty 1

## 2019-09-21 MED ORDER — FLUTICASONE PROPIONATE 50 MCG/ACT NA SUSP
2.0000 | Freq: Every day | NASAL | Status: DC
  Administered 2019-09-22 – 2019-09-24 (×3): 2 via NASAL
  Filled 2019-09-21: qty 16

## 2019-09-21 MED ORDER — PROPRANOLOL HCL ER 80 MG PO CP24
80.0000 mg | ORAL_CAPSULE | Freq: Every day | ORAL | Status: DC
  Administered 2019-09-24: 80 mg via ORAL
  Filled 2019-09-21 (×3): qty 1

## 2019-09-21 MED ORDER — SUCCINYLCHOLINE CHLORIDE 20 MG/ML IJ SOLN
INTRAMUSCULAR | Status: DC | PRN
  Administered 2019-09-21: 100 mg via INTRAVENOUS

## 2019-09-21 MED ORDER — ONDANSETRON HCL 4 MG/2ML IJ SOLN
4.0000 mg | Freq: Four times a day (QID) | INTRAMUSCULAR | Status: DC | PRN
  Administered 2019-09-23: 4 mg via INTRAVENOUS
  Filled 2019-09-21: qty 2

## 2019-09-21 MED ORDER — DEXAMETHASONE SODIUM PHOSPHATE 10 MG/ML IJ SOLN
INTRAMUSCULAR | Status: DC | PRN
  Administered 2019-09-21: 10 mg via INTRAVENOUS

## 2019-09-21 MED ORDER — BUPIVACAINE-EPINEPHRINE (PF) 0.5% -1:200000 IJ SOLN
INTRAMUSCULAR | Status: DC | PRN
  Administered 2019-09-21: 30 mL via PERINEURAL

## 2019-09-21 MED ORDER — ALLOPURINOL 300 MG PO TABS
300.0000 mg | ORAL_TABLET | Freq: Every day | ORAL | Status: DC
  Administered 2019-09-22 – 2019-09-24 (×3): 300 mg via ORAL
  Filled 2019-09-21 (×3): qty 1

## 2019-09-21 MED ORDER — TIZANIDINE HCL 2 MG PO TABS
4.0000 mg | ORAL_TABLET | Freq: Two times a day (BID) | ORAL | Status: DC | PRN
  Filled 2019-09-21: qty 2

## 2019-09-21 MED ORDER — MAGNESIUM HYDROXIDE 400 MG/5ML PO SUSP: 30.0000 mL | Freq: Every day | ORAL | Status: DC | PRN

## 2019-09-21 MED ORDER — FENTANYL CITRATE (PF) 100 MCG/2ML IJ SOLN
INTRAMUSCULAR | Status: AC
  Administered 2019-09-21: 25 ug via INTRAVENOUS
  Filled 2019-09-21: qty 2

## 2019-09-21 MED ORDER — MIDAZOLAM HCL 2 MG/2ML IJ SOLN
INTRAMUSCULAR | Status: AC
  Filled 2019-09-21: qty 2

## 2019-09-21 MED ORDER — FLEET ENEMA 7-19 GM/118ML RE ENEM: 1.0000 | ENEMA | Freq: Once | RECTAL | Status: DC | PRN

## 2019-09-21 MED ORDER — ONDANSETRON HCL 4 MG/2ML IJ SOLN
INTRAMUSCULAR | Status: AC
  Filled 2019-09-21: qty 2

## 2019-09-21 MED ORDER — METOCLOPRAMIDE HCL 10 MG PO TABS: 5.0000 mg | ORAL_TABLET | Freq: Three times a day (TID) | ORAL | Status: DC | PRN

## 2019-09-21 MED ORDER — CHLORHEXIDINE GLUCONATE 4 % EX LIQD
60.0000 mL | Freq: Once | CUTANEOUS | Status: DC
  Administered 2019-09-21: 4 via TOPICAL

## 2019-09-21 MED ORDER — DOCUSATE SODIUM 100 MG PO CAPS
100.0000 mg | ORAL_CAPSULE | Freq: Two times a day (BID) | ORAL | Status: DC
  Administered 2019-09-21 – 2019-09-24 (×4): 100 mg via ORAL
  Filled 2019-09-21 (×6): qty 1

## 2019-09-21 MED ORDER — HYDROMORPHONE HCL 1 MG/ML IJ SOLN
0.5000 mg | INTRAMUSCULAR | Status: DC | PRN
  Administered 2019-09-23: 1 mg via INTRAVENOUS
  Filled 2019-09-21: qty 1

## 2019-09-21 MED ORDER — VITAMIN B-12 1000 MCG PO TABS
5000.0000 ug | ORAL_TABLET | Freq: Every day | ORAL | Status: DC
  Administered 2019-09-22 – 2019-09-24 (×3): 5000 ug via ORAL
  Filled 2019-09-21 (×3): qty 5

## 2019-09-21 MED ORDER — PROPOFOL 500 MG/50ML IV EMUL
INTRAVENOUS | Status: AC
  Filled 2019-09-21: qty 50

## 2019-09-21 MED ORDER — CLINDAMYCIN PHOSPHATE 900 MG/50ML IV SOLN
INTRAVENOUS | Status: AC
  Filled 2019-09-21: qty 50

## 2019-09-21 MED ORDER — TRAMADOL HCL 50 MG PO TABS
50.0000 mg | ORAL_TABLET | Freq: Four times a day (QID) | ORAL | Status: DC | PRN
  Administered 2019-09-22 – 2019-09-23 (×2): 50 mg via ORAL
  Filled 2019-09-21 (×3): qty 1

## 2019-09-21 MED ORDER — LOSARTAN POTASSIUM 50 MG PO TABS
100.0000 mg | ORAL_TABLET | Freq: Every day | ORAL | Status: DC
  Administered 2019-09-22 – 2019-09-24 (×3): 100 mg via ORAL
  Filled 2019-09-21 (×3): qty 2

## 2019-09-21 MED ORDER — KETOROLAC TROMETHAMINE 15 MG/ML IJ SOLN
15.0000 mg | Freq: Once | INTRAMUSCULAR | Status: AC
  Administered 2019-09-21: 15 mg via INTRAVENOUS

## 2019-09-21 MED ORDER — OXYCODONE HCL 5 MG PO TABS
5.0000 mg | ORAL_TABLET | ORAL | Status: DC | PRN
  Administered 2019-09-22 – 2019-09-23 (×2): 10 mg via ORAL
  Administered 2019-09-23: 5 mg via ORAL
  Administered 2019-09-23: 10 mg via ORAL
  Administered 2019-09-24: 5 mg via ORAL
  Filled 2019-09-21: qty 1
  Filled 2019-09-21 (×2): qty 2
  Filled 2019-09-21: qty 1
  Filled 2019-09-21: qty 2

## 2019-09-21 MED ORDER — LACTATED RINGERS IV SOLN: INTRAVENOUS | Status: DC

## 2019-09-21 MED ORDER — ACETAMINOPHEN 500 MG PO TABS
1000.0000 mg | ORAL_TABLET | Freq: Four times a day (QID) | ORAL | Status: AC
  Administered 2019-09-21 – 2019-09-22 (×3): 1000 mg via ORAL
  Filled 2019-09-21 (×3): qty 2

## 2019-09-21 MED ORDER — ENOXAPARIN SODIUM 40 MG/0.4ML ~~LOC~~ SOLN
40.0000 mg | SUBCUTANEOUS | Status: DC
  Administered 2019-09-22 – 2019-09-24 (×3): 40 mg via SUBCUTANEOUS
  Filled 2019-09-21 (×3): qty 0.4

## 2019-09-21 MED ORDER — OXYCODONE HCL 5 MG/5ML PO SOLN: 5.0000 mg | Freq: Once | ORAL | Status: AC | PRN

## 2019-09-21 MED ORDER — KETOROLAC TROMETHAMINE 15 MG/ML IJ SOLN
INTRAMUSCULAR | Status: AC
  Filled 2019-09-21: qty 1

## 2019-09-21 MED ORDER — FOLIC ACID 1 MG PO TABS
500.0000 ug | ORAL_TABLET | Freq: Every day | ORAL | Status: DC
  Administered 2019-09-22 – 2019-09-24 (×3): 0.5 mg via ORAL
  Filled 2019-09-21 (×3): qty 1

## 2019-09-21 MED ORDER — PROMETHAZINE HCL 25 MG/ML IJ SOLN: 12.5000 mg | Freq: Once | INTRAMUSCULAR | Status: AC

## 2019-09-21 MED ORDER — ACETAMINOPHEN 325 MG PO TABS: 325.0000 mg | ORAL_TABLET | Freq: Four times a day (QID) | ORAL | Status: DC | PRN

## 2019-09-21 MED ORDER — FENTANYL CITRATE (PF) 100 MCG/2ML IJ SOLN
25.0000 ug | INTRAMUSCULAR | Status: DC | PRN
  Administered 2019-09-21 (×3): 25 ug via INTRAVENOUS

## 2019-09-21 MED ORDER — FENTANYL CITRATE (PF) 100 MCG/2ML IJ SOLN
INTRAMUSCULAR | Status: DC | PRN
  Administered 2019-09-21: 50 ug via INTRAVENOUS
  Administered 2019-09-21: 25 ug via INTRAVENOUS
  Administered 2019-09-21: 50 ug via INTRAVENOUS
  Administered 2019-09-21 (×3): 25 ug via INTRAVENOUS

## 2019-09-21 SURGICAL SUPPLY — 66 items
BLADE SAW SAG 25X90X1.19 (BLADE) ×3 IMPLANT
BLADE SURG SZ20 CARB STEEL (BLADE) ×3 IMPLANT
BNDG ELASTIC 6X5.8 VLCR NS LF (GAUZE/BANDAGES/DRESSINGS) ×3 IMPLANT
BNDG ELASTIC 6X5.8 VLCR STR LF (GAUZE/BANDAGES/DRESSINGS) ×2 IMPLANT
CANISTER SUCT 1200ML W/VALVE (MISCELLANEOUS) ×3 IMPLANT
CANISTER SUCT 3000ML PPV (MISCELLANEOUS) ×3 IMPLANT
CEMENT BONE R 1X40 (Cement) ×6 IMPLANT
CEMENT VACUUM MIXING SYSTEM (MISCELLANEOUS) ×3 IMPLANT
CHLORAPREP W/TINT 26 (MISCELLANEOUS) ×3 IMPLANT
COOLER POLAR GLACIER W/PUMP (MISCELLANEOUS) ×3 IMPLANT
COVER MAYO STAND REUSABLE (DRAPES) ×3 IMPLANT
COVER WAND RF STERILE (DRAPES) ×3 IMPLANT
CUFF TOURN SGL QUICK 24 (TOURNIQUET CUFF)
CUFF TOURN SGL QUICK 30 (TOURNIQUET CUFF)
CUFF TOURN SGL QUICK 34 (TOURNIQUET CUFF) ×2
CUFF TRNQT CYL 24X4X16.5-23 (TOURNIQUET CUFF) IMPLANT
CUFF TRNQT CYL 30X4X21-28X (TOURNIQUET CUFF) IMPLANT
CUFF TRNQT CYL 34X4X40X1 (TOURNIQUET CUFF) IMPLANT
DRAPE 3/4 80X56 (DRAPES) ×3 IMPLANT
DRSG MEPILEX SACRM 8.7X9.8 (GAUZE/BANDAGES/DRESSINGS) ×2 IMPLANT
DRSG OPSITE POSTOP 4X10 (GAUZE/BANDAGES/DRESSINGS) ×3 IMPLANT
DRSG OPSITE POSTOP 4X8 (GAUZE/BANDAGES/DRESSINGS) ×3 IMPLANT
ELECT CAUTERY BLADE 6.4 (BLADE) ×3 IMPLANT
ELECT REM PT RETURN 9FT ADLT (ELECTROSURGICAL) ×3
ELECTRODE REM PT RTRN 9FT ADLT (ELECTROSURGICAL) ×1 IMPLANT
FEMORAL CR LEFT  70MM (Joint) ×2 IMPLANT
FEMORAL CR LEFT 70MM (Joint) IMPLANT
GLOVE BIO SURGEON STRL SZ7.5 (GLOVE) ×12 IMPLANT
GLOVE BIO SURGEON STRL SZ8 (GLOVE) ×12 IMPLANT
GLOVE BIOGEL PI IND STRL 8 (GLOVE) ×1 IMPLANT
GLOVE BIOGEL PI INDICATOR 8 (GLOVE) ×2
GLOVE INDICATOR 8.0 STRL GRN (GLOVE) ×5 IMPLANT
GOWN STRL REUS W/ TWL LRG LVL3 (GOWN DISPOSABLE) ×1 IMPLANT
GOWN STRL REUS W/ TWL XL LVL3 (GOWN DISPOSABLE) ×1 IMPLANT
GOWN STRL REUS W/TWL LRG LVL3 (GOWN DISPOSABLE) ×6
GOWN STRL REUS W/TWL XL LVL3 (GOWN DISPOSABLE) ×2
HOLDER FOLEY CATH W/STRAP (MISCELLANEOUS) ×1 IMPLANT
HOOD PEEL AWAY FLYTE STAYCOOL (MISCELLANEOUS) ×11 IMPLANT
INSERT TIB BEARING 75X12 (Insert) ×2 IMPLANT
KIT TURNOVER KIT A (KITS) ×3 IMPLANT
NDL SAFETY ECLIPSE 18X1.5 (NEEDLE) ×2 IMPLANT
NDL SPNL 20GX3.5 QUINCKE YW (NEEDLE) ×1 IMPLANT
NEEDLE HYPO 18GX1.5 SHARP (NEEDLE) ×4
NEEDLE SPNL 20GX3.5 QUINCKE YW (NEEDLE) ×3 IMPLANT
NS IRRIG 1000ML POUR BTL (IV SOLUTION) ×3 IMPLANT
PACK TOTAL KNEE (MISCELLANEOUS) ×3 IMPLANT
PAD WRAPON POLAR KNEE (MISCELLANEOUS) ×1 IMPLANT
PATELLA STD 34X8.5 (Orthopedic Implant) ×2 IMPLANT
PENCIL SMOKE EVACUATOR (MISCELLANEOUS) ×2 IMPLANT
PLATE KNEE TIBIAL 75MM FIXED (Plate) ×2 IMPLANT
PULSAVAC PLUS IRRIG FAN TIP (DISPOSABLE) ×3
SET GUIDE SURG MEDIAL KNEE (INSTRUMENTS) ×2 IMPLANT
SOL .9 NS 3000ML IRR  AL (IV SOLUTION) ×2
SOL .9 NS 3000ML IRR UROMATIC (IV SOLUTION) ×1 IMPLANT
STAPLER SKIN PROX 35W (STAPLE) ×3 IMPLANT
SUCTION FRAZIER HANDLE 10FR (MISCELLANEOUS) ×2
SUCTION TUBE FRAZIER 10FR DISP (MISCELLANEOUS) ×1 IMPLANT
SUT VIC AB 0 CT1 36 (SUTURE) ×9 IMPLANT
SUT VIC AB 2-0 CT1 27 (SUTURE) ×8
SUT VIC AB 2-0 CT1 TAPERPNT 27 (SUTURE) ×3 IMPLANT
SYR 10ML LL (SYRINGE) ×3 IMPLANT
SYR 20ML LL LF (SYRINGE) ×3 IMPLANT
SYR 30ML LL (SYRINGE) ×9 IMPLANT
TIP FAN IRRIG PULSAVAC PLUS (DISPOSABLE) ×1 IMPLANT
TRAY FOLEY MTR SLVR 16FR STAT (SET/KITS/TRAYS/PACK) ×1 IMPLANT
WRAPON POLAR PAD KNEE (MISCELLANEOUS) ×3

## 2019-09-21 NOTE — Op Note (Signed)
09/21/2019  1:14 PM  Patient:   Sabrina Green  Pre-Op Diagnosis:   Degenerative joint disease, left knee.  Post-Op Diagnosis:   Same  Procedure:   Left TKA using a Signature-guided all-cemented [employer] Vanguard system with a 70 mm PCR femur, a 75 mm tibial tray with a 12 mm anterior stabilized E-poly insert, and a 34 x 8.5 mm all-poly 3-pegged domed patella.  Surgeon:   Pascal Lux, MD  Assistant:   Cameron Proud, PA-C   Anesthesia:   GET  Findings:   As above  Complications:   None  EBL:   20 cc  Fluids:   500 cc crystalloid  UOP:   None  TT:   95 minutes at 300 mmHg  Drains:   None  Closure:   Staples  Implants:   As above  Brief Clinical Note:   The patient is a 74 year old female with a long history of progressively worsening left knee pain. The patient's symptoms have progressed despite medications, activity modification, injections, etc. The patient's history and examination were consistent with advanced degenerative joint disease of the left knee confirmed by plain radiographs. The patient presents at this time for a left total knee arthroplasty.  Procedure:   The patient was brought into the operating room and lain in the supine position. After adequate general endotracheal intubation and anesthesia were obtained, the left lower extremity was prepped with ChloraPrep solution and draped sterilely. Preoperative antibiotics were administered. After verifying the proper laterality with a surgical timeout, the limb was exsanguinated with an Esmarch and the tourniquet inflated to 300 mmHg. A standard anterior approach to the knee was made through an approximately 7 inch incision. The incision was carried down through the subcutaneous tissues to expose superficial retinaculum. This was split the length of the incision and the medial flap elevated sufficiently to expose the medial retinaculum. The medial retinaculum was incised, leaving a 3-4 mm cuff of tissue on the  patella. This was extended distally along the medial border of the patellar tendon and proximally through the medial third of the quadriceps tendon. A subtotal fat pad excision was performed before the soft tissues were elevated off the anteromedial and anterolateral aspects of the proximal tibia to the level of the collateral ligaments. The anterior portions of the medial and lateral menisci were removed, as was the anterior cruciate ligament.   Attention was directed to the distal femur. The soft tissues were debrided from around the margins of the distal femur to better accommodate the Signature guide. The Signature guide was positioned appropriately and the distal and anterior pins placed. The distal pins were removed before the distal cutting block was placed over the anterior pins and the appropriate distal femoral cut was made. The 70 mm 4-in-1 cutting block was positioned appropriately using the previously drilled distal holes, and first the posterior, then the posterior chamfer, the anterior chamfer, and finally the anterior cuts were made.   Attention was directed to the proximal tibia. At this point, the posterior portions medial and lateral menisci were removed. Again the soft tissues were debrided off the anterior and articular portions of the proximal tibia to better accommodate the Signature tibial guide.  Again the two anterior pins were placed before the tibial cutting block was positioned appropriately. The proximal tibial cut was made.    A trial reduction was performed using the appropriate femoral and tibial components with first the 10 mm and then the 12 mm insert. The 12 mm  insert demonstrated excellent stability to varus and valgus stressing both in flexion and extension while permitting full extension. Patella tracking was assessed and found to be excellent. Therefore, the tibial guide position was marked on the proximal tibia. The patella thickness was measured and found to be 21 mm.  Therefore, the appropriate cut was made. The patellar surface was measured and found to be optimally replicated by the 34 mm component. The three peg holes were drilled in place before the trial button was inserted. Patella tracking was assessed and found to be excellent, passing the "no thumb test". The lug holes were drilled into the distal femur before the trial component was removed, leaving only the tibial tray. The keel was then created using the appropriate tower, reamer, and punch.  The bony surfaces were prepared for cementing by irrigating them thoroughly with bacitracin saline solution via the jet lavage system. A bone plug was fashioned from some of the bone that had been removed previously and used to plug the distal femoral canal. In addition, 20 cc of Exparel diluted out to 60 cc with normal saline and 30 cc of 0.5% Sensorcaine were injected into the postero-medial and postero-lateral aspects of the knee, the medial and lateral gutter regions, and the peri-incisional tissues to help with postoperative analgesia. Meanwhile, the cement was being mixed on the back table. When it was ready, the tibial tray was cemented in first. The excess cement was removed using [employer] fast streamer. Next, the femoral component was impacted into place. Again, the excess cement was removed using [employer] fast streamer. The 12 mm trial insert was positioned and the knee brought into extension while the cement hardened. Finally, the patella was cemented into place and secured using the patellar clamp. Again, the excess cement was removed using [employer] fast streamer. Once the cement had hardened, the knee was placed through a range of motion with the findings as described above. Therefore, the trial insert was removed and, after verifying that no cement had been retained posteriorly, the permanent 12 mm anterior stabilized E-polyethylene insert was positioned and secured using the appropriate key locking mechanism. Again the knee was  placed through a range of motion with the findings as described above.  The wound was copiously irrigated with sterile saline solution using the jet lavage system before the quadriceps tendon and retinacular layer were reapproximated using #0 Vicryl interrupted sutures. The superficial retinacular layer also was closed using a running #0 Vicryl suture. A total of 10 cc of transexemic acid (TXA) was injected intra-articularly before the subcutaneous tissues were closed in several layers using 2-0 Vicryl interrupted sutures. The skin was closed using staples. A sterile honeycomb dressing was applied to the skin before the leg was wrapped with an Ace wrap to accommodate the Polar Care device. The patient was then awakened and returned to the recovery room in satisfactory condition after tolerating the procedure well.

## 2019-09-21 NOTE — Anesthesia Preprocedure Evaluation (Addendum)
Anesthesia Evaluation  Patient identified by MRN, date of birth, ID band Patient awake    Reviewed: Allergy & Precautions, H&P , NPO status , Patient's Chart, lab work & pertinent test results  History of Anesthesia Complications (+) history of anesthetic complications  Airway Mallampati: III  TM Distance: >3 FB Neck ROM: full    Dental  (+) Chipped, Poor Dentition, Caps   Pulmonary neg shortness of breath, asthma ,           Cardiovascular Exercise Tolerance: Good hypertension, (-) angina(-) Past MI and (-) DOE      Neuro/Psych PSYCHIATRIC DISORDERS  Neuromuscular disease    GI/Hepatic Neg liver ROS, GERD  Medicated and Controlled,  Endo/Other  negative endocrine ROS  Renal/GU CRFRenal disease     Musculoskeletal  (+) Arthritis , Fibromyalgia -  Abdominal   Peds  Hematology negative hematology ROS (+)   Anesthesia Other Findings Past Medical History: No date: Anemia     Comment:  vitamin b and d deficiency No date: Anxiety No date: Arthritis     Comment:  osteoarthrits No date: Chronic pain syndrome No date: CKD (chronic kidney disease) stage 3, GFR 30-59 ml/min No date: Complication of anesthesia     Comment:  difficulty waking up No date: DDD (degenerative disc disease), cervical No date: Fibromyalgia     Comment:  not accurate, per patient No date: GERD (gastroesophageal reflux disease) No date: Gout No date: Hearing loss 1987: History of kidney stones No date: Hypertension No date: Rheumatoid arthritis ([place])     Comment:  orencia infusions No date: Vitamin B12 deficiency No date: Vitamin D deficiency  Past Surgical History: No date: ABDOMINAL HYSTERECTOMY No date: ABDOMINAL HYSTERECTOMY 1989: ANKLE FRACTURE SURGERY; Right     Comment:  [place], [place].   METAL IN ANKLE 02/09/2019: BREAST BIOPSY; Right     Comment:  affirm bx of calcs, ribbon marker, path pending No date:  CESAREAN SECTION No date: CHOLECYSTECTOMY 2015: EYE SURGERY; Bilateral     Comment:  cataract surgery, [place], [place] No date: KNEE ARTHROSCOPY No date: TONSILLECTOMY No date: TUBAL LIGATION     Reproductive/Obstetrics negative OB ROS                             Anesthesia Physical Anesthesia Plan  ASA: III  Anesthesia Plan: General ETT and Modified Rapid Sequence   Post-op Pain Management:    Induction: Intravenous  PONV Risk Score and Plan: Ondansetron, Dexamethasone, Midazolam and Treatment may vary due to age or medical condition  Airway Management Planned: Oral ETT and Video Laryngoscope Planned  Additional Equipment:   Intra-op Plan:   Post-operative Plan: Extubation in OR  Informed Consent: I have reviewed the patients History and Physical, chart, labs and discussed the procedure including the risks, benefits and alternatives for the proposed anesthesia with the patient or authorized representative who has indicated his/her understanding and acceptance.     Dental Advisory Given  Plan Discussed with: Anesthesiologist, CRNA and Surgeon  Anesthesia Plan Comments: (Patient adamantly refuses spinal and request GA for case  Patient consented for risks of anesthesia including but not limited to:  - adverse reactions to medications - damage to teeth, lips or other oral mucosa - sore throat or hoarseness - Damage to heart, brain, lungs or loss of life  Patient voiced understanding.)       Anesthesia Quick Evaluation

## 2019-09-21 NOTE — TOC Progression Note (Signed)
Transition of Care [facility]) - Progression Note    Patient Details  Name: Sabrina Green MRN: [id number] Date of Birth: March 07, 1946  Transition of [facility]) CM/SW Contact  Su Hilt, RN Phone Number: 09/21/2019, 4:06 PM  Clinical Narrative:   Requested the price of Lovenox and will notify the patient once she gets off         Expected Discharge Plan and Services                                                 Social Determinants of Health (SDOH) Interventions    Readmission Risk Interventions No flowsheet data found.

## 2019-09-21 NOTE — Progress Notes (Signed)
PT Cancellation Note  Patient Details Name: Sabrina Green MRN: [id number] DOB: November 19, 1945   Cancelled Treatment:    Reason Eval/Treat Not Completed: Other (comment). Consult received and chart reviewed. Pt just transferred to floor. Evaluation attempted, however pt very lethargic, unable to keep eyes open. Not safe to perform OOB mobility this date. Will re-attempt next date.   Ahmarion Saraceno 09/21/2019, 3:59 PM  Greggory Stallion, PT, DPT [phone]

## 2019-09-21 NOTE — Anesthesia Procedure Notes (Addendum)
Procedure Name: Intubation Date/Time: 09/21/2019 10:59 AM Performed by: Doreen Salvage, CRNA Pre-anesthesia Checklist: Patient identified, Emergency Drugs available, Suction available and Patient being monitored Patient Re-evaluated:Patient Re-evaluated prior to induction Oxygen Delivery Method: Circle system utilized Preoxygenation: Pre-oxygenation with 100% oxygen Induction Type: IV induction, Cricoid Pressure applied and Rapid sequence Ventilation: Mask ventilation without difficulty Laryngoscope Size: Mac, 3 and McGraph Grade View: Grade II Tube type: Oral Tube size: 7.0 mm Number of attempts: 1 Airway Equipment and Method: Stylet Placement Confirmation: ETT inserted through vocal cords under direct vision,  positive ETCO2 and breath sounds checked- equal and bilateral Secured at: 20 cm Tube secured with: Tape Dental Injury: Teeth and Oropharynx as per pre-operative assessment

## 2019-09-21 NOTE — H&P (Signed)
Paper H&P to be scanned into permanent record. H&P reviewed and patient re-examined. No changes. 

## 2019-09-21 NOTE — Transfer of Care (Signed)
Immediate Anesthesia Transfer of Care Note  Patient: Sabrina Green  Procedure(s) Performed: TOTAL KNEE ARTHROPLASTY (Left Knee)  Patient Location: PACU  Anesthesia Type:General  Level of Consciousness: responds to stimulation  Airway & Oxygen Therapy: Patient Spontanous Breathing and Patient connected to face mask  Post-op Assessment: Report given to RN, Post -op Vital signs reviewed and stable and Patient moving all extremities X 4  Post vital signs: Reviewed and stable  Last Vitals:  Vitals Value Taken Time  BP 153/73 09/21/19 1315  Temp    Pulse 68 09/21/19 1317  Resp 16 09/21/19 1317  SpO2 100 % 09/21/19 1317  Vitals shown include unvalidated device data.  Last Pain:  Vitals:   09/21/19 0908  TempSrc: Tympanic  PainSc: 0-No pain         Complications: No apparent anesthesia complications

## 2019-09-22 LAB — BASIC METABOLIC PANEL
Anion gap: 12 (ref 5–15)
BUN: 37 mg/dL — ABNORMAL HIGH (ref 8–23)
CO2: 23 mmol/L (ref 22–32)
Calcium: 8.5 mg/dL — ABNORMAL LOW (ref 8.9–10.3)
Chloride: 106 mmol/L (ref 98–111)
Creatinine, Ser: 1.9 mg/dL — ABNORMAL HIGH (ref 0.44–1.00)
GFR calc Af Amer: 30 mL/min — ABNORMAL LOW (ref >60)
GFR calc non Af Amer: 26 mL/min — ABNORMAL LOW (ref >60)
Glucose, Bld: 164 mg/dL — ABNORMAL HIGH (ref 70–99)
Potassium: 4.9 mmol/L (ref 3.5–5.1)
Sodium: 141 mmol/L (ref 135–145)

## 2019-09-22 LAB — CBC
HCT: 36.4 % (ref 36.0–46.0)
Hemoglobin: 11.3 g/dL — ABNORMAL LOW (ref 12.0–15.0)
MCH: 28.6 pg (ref 26.0–34.0)
MCHC: 31 g/dL (ref 30.0–36.0)
MCV: 92.2 fL (ref 80.0–100.0)
Platelets: 135 10*3/uL — ABNORMAL LOW (ref 150–400)
RBC: 3.95 MIL/uL (ref 3.87–5.11)
RDW: 14.6 % (ref 11.5–15.5)
WBC: 11.8 10*3/uL — ABNORMAL HIGH (ref 4.0–10.5)
nRBC: 0 % (ref 0.0–0.2)

## 2019-09-22 NOTE — Evaluation (Signed)
Occupational Therapy Evaluation Patient Details Name: Sabrina Green MRN: [id number] DOB: Mar 11, 1946 Today's Date: 09/22/2019    History of Present Illness Pt admitted for L TKR.   Clinical Impression   Sabrina Green was seen for OT evaluation this date, POD#1 from above surgery. Pt was independent in all ADLs prior to surgery, however she endorses self-limiting activity due to increased knee pain. Pt endorses multiple falls in the past year, primarily in the community. Pt is eager to return to PLOF with less pain and improved safety and independence. Pt currently requires minimal assist for LB dressing while in seated position due to pain and limited AROM of L knee. Pt instructed in polar care mgt, falls prevention strategies, home/routines modifications, DME/AE for LB bathing and dressing tasks, and compression stocking mgt. Handout provided to support recall and carryover of education provided. Pt return demonstrates understanding of use of LHR and sock-aid during session, however, Pt would benefit from skilled OT services including additional instruction in dressing techniques with or without assistive devices for dressing and bathing skills to support recall and carryover prior to discharge and ultimately to maximize safety, independence, and minimize falls risk and caregiver burden. Recommend HHOT upon hospital DC to maximize pt safety and functional independence during meaningful occupations of daily life.      Follow Up Recommendations  Supervision - Intermittent;Home health OT    Equipment Recommendations  3 in 1 bedside commode    Recommendations for Other Services       Precautions / Restrictions Precautions Precautions: Knee;Fall Precaution Booklet Issued: Yes (comment) Restrictions Weight Bearing Restrictions: Yes LLE Weight Bearing: Weight bearing as tolerated      Mobility Bed Mobility Overal bed mobility: Needs Assistance Bed Mobility: Supine to Sit;Sit to Supine      Supine to sit: Min assist Sit to supine: Min assist   General bed mobility comments: needs assist for B LE management. Once seated, able to scoot out towards EOB. Increased time/effort to perform. .  Transfers Overall transfer level: Needs assistance Equipment used: Rolling walker (2 wheeled) Transfers: Sit to/from Stand Sit to Stand: Min assist         General transfer comment: Deferred. pt endorses fatigue after working with physical therapy this date. Per PT notes pt required min A for STS from bed at elevated position.    Balance Overall balance assessment: Needs assistance Sitting-balance support: Feet supported Sitting balance-Leahy Scale: Good Sitting balance - Comments: Steady static/dynamic sitting. No LOB appreciated during functional tasks this date.   Standing balance support: Bilateral upper extremity supported Standing balance-Leahy Scale: Fair Standing balance comment: slight unsteadiness in post direction                           ADL either performed or assessed with clinical judgement   ADL Overall ADL's : Needs assistance/impaired                                       General ADL Comments: Pt is functionally limited by increased pain and decreased AROM of her L knee. Requires min to moderate assist for LB ADL management including bathing and dressing in a seated position. Pt return demos use of sock aid and long handle reacher to doff/don sock on non-operative extremity this date. Requires min cueing for technique. Would benefit from review and opportunities to trial AE  for donning pants, etc.     Vision Baseline Vision/History: Wears glasses Wears Glasses: Reading only Patient Visual Report: No change from baseline       Perception     Praxis      Pertinent Vitals/Pain Pain Assessment: 0-10 Pain Score: 3  Pain Location: L knee Pain Descriptors / Indicators: Sore;Aching Pain Intervention(s): Limited activity within  patient's tolerance;Monitored during session;Repositioned;Ice applied     Hand Dominance Right   Extremity/Trunk Assessment Upper Extremity Assessment Upper Extremity Assessment: Overall WFL for tasks assessed   Lower Extremity Assessment Lower Extremity Assessment: Defer to PT evaluation;LLE deficits/detail LLE Deficits / Details: s/p L TKA, pt endorses continued L foot drop since sx. States some sensation does appear to be returning to her calf/lower leg. LLE Sensation: decreased light touch LLE Coordination: decreased gross motor       Communication Communication Communication: No difficulties   Cognition Arousal/Alertness: Awake/alert Behavior During Therapy: WFL for tasks assessed/performed Overall Cognitive Status: Within Functional Limits for tasks assessed                                     General Comments       Exercises Exercises: Other exercises;Total Joint Total Joint Exercises Goniometric ROM: L knee AAROM: 0-70 degrees Other Exercises Other Exercises: supine ther-ex performed on L LE including quad sets, SLR, SAQ, and hip abd/add. Attempted AP however unable at this time. 10 reps with min assist. Other Exercises: ambulated over to [facility] with min assist and RW. Increased assist and cues required for turning in order to sit on [facility] safely. Able to perform hygiene with supervision. Safe technique Other Exercises: Pt educated in falls prevention strategies, safe use of AE/DME for ADL mgt, compression stocking management, polar care management, and routines modifications to support safety and functional independence upon hospital DC. Handout provided to support recall and carryover of education provided. Other Exercises: Pt requests to perform wash up this date. OT provides set-up of basin, towels, lotion, and deodorant on room tray with pt seated at EOB. Pt primary RN notified pt left with sponge bath set-up. Family present in room. Pt instructed to use  call bell for assist before standing and/or getting back to bed this date.   Shoulder Instructions      Home Living Family/patient expects to be discharged to:: Private residence Living Arrangements: Alone Available Help at Discharge: Family;Available 24 hours/day(Dtr plans to stay with pt for first week after sx.) Type of Home: House Home Access: Level entry     Home Layout: One level     Bathroom Shower/Tub: Teacher, early years/pre: Standard Bathroom Accessibility: Yes How Accessible: Accessible via walker Home Equipment: Hand held shower head;Adaptive equipment;Bedside commode Adaptive Equipment: Reacher        Prior Functioning/Environment Level of Independence: Independent        Comments: very limited mobility prior to surgery due to pain. Reports she didn't use any AD, however did limp and self limited her mobility. Endorses multiple falls in the past year.        OT Problem List: Decreased strength;Decreased coordination;Pain;Decreased activity tolerance;Decreased safety awareness;Impaired balance (sitting and/or standing);Decreased knowledge of use of DME or AE;Decreased range of motion      OT Treatment/Interventions: Self-care/ADL training;Therapeutic exercise;Therapeutic activities;DME and/or AE instruction;Patient/family education;Balance training    OT Goals(Current goals can be found in the care plan section) Acute Rehab OT  Goals Patient Stated Goal: to go home OT Goal Formulation: With patient Time For Goal Achievement: 10/06/19 Potential to Achieve Goals: Good ADL Goals Pt Will Perform Lower Body Dressing: sit to/from stand;with supervision;with set-up;with caregiver independent in assisting(With LRAD PRN for improved safety and functional independence upon hospital DC.) Pt Will Transfer to Toilet: bedside commode;ambulating;with set-up;with supervision(With LRAD PRN for improved safety and functional independence upon hospital DC.) Pt Will  Perform Toileting - Clothing Manipulation and hygiene: sit to/from stand;with supervision;with set-up(With LRAD PRN for improved safety and functional independence upon hospital DC.) Additional ADL Goal #1: Pt will independently verbalize a plan to implement at least three learned falls prevention strategies into her daily routines/home environment for improved safety and functional independence upon hospital DC.  OT Frequency: Min 2X/week   Barriers to D/C: Decreased caregiver support          Co-evaluation              AM-PAC OT "6 Clicks" Daily Activity     Outcome Measure Help from another person eating meals?: None Help from another person taking care of personal grooming?: A Little Help from another person toileting, which includes using toliet, bedpan, or urinal?: A Little Help from another person bathing (including washing, rinsing, drying)?: A Little Help from another person to put on and taking off regular upper body clothing?: A Lot Help from another person to put on and taking off regular lower body clothing?: A Little 6 Click Score: 18   End of Session    Activity Tolerance: Patient tolerated treatment well Patient left: in bed;with call bell/phone within reach;with family/visitor present;Other (comment)(Pt requests to complete seated sponge bath. Declines to wait for NT for assist. Pt primary RN notified pt seated EOB and will require assist with returning to bed once done with bath.)  OT Visit Diagnosis: Other abnormalities of gait and mobility (R26.89);Pain Pain - Right/Left: Left Pain - part of body: Knee                Time: [id number] OT Time Calculation (min): 45 min Charges:  OT General Charges $OT Visit: 1 Visit OT Evaluation $OT Eval Moderate Complexity: 1 Mod OT Treatments $Self Care/Home Management : 38-52 mins  Shara Blazing, M.S., OTR/L Ascom: [phone] 09/22/19, 3:24 PM

## 2019-09-22 NOTE — Progress Notes (Signed)
Applied CPM going at 70 degrees. Patient tolerating it well. Will continue to monitor.  Christene Slates  09/22/2019  11:38 PM

## 2019-09-22 NOTE — Progress Notes (Signed)
Patient placed on CPM machine per orders. Patient was placed on machine at  2320 and will be removed at 0720.

## 2019-09-22 NOTE — TOC Initial Note (Signed)
Transition of Care (TOC) - Initial/Assessment Note   Met with the patient at the bedside, She will be going home and her daughter stay with her.  I explained to her I could not get the price of her Lovenox due to they need her updated information and she would need to call them, she stated she no longer has that insurance,  She stated that she could afford her Lovenox and other meds She has a walker at home but it does not have wheels, it was not bought thru insurance and she would like a RW, I notified Brad with [employer], it will be brought to her room before DC She is set up with [facility] for PT No additional Needs Patient Details  Name: Sabrina Green MRN: [id number] Date of Birth: 1946/05/07  Transition of Care [facility]) CM/SW Contact:    Su Hilt, RN Phone Number: 09/22/2019, 8:59 AM  Clinical Narrative:                   Expected Discharge Plan: [place] Barriers to Discharge: Continued Medical Work up   Patient Goals and CMS Choice Patient states their goals for this hospitalization and ongoing recovery are:: go home with her daughter      Expected Discharge Plan and Services Expected Discharge Plan: [place]   Discharge Planning Services: CM Consult   Living arrangements for the past 2 months: Single Family Home                 DME Arranged: Walker rolling DME Agency: [employer] Date DME Agency Contacted: 09/22/19 Time DME Agency Contacted: [phone] Representative spoke with at DME Agency: Leroy Sea HH Arranged: PT [place]: [facility] ([facility] [employer]) Date [place]: 09/22/19 Time [place]: [phone] Representative spoke with at [place]: Helene Kelp  Prior Living Arrangements/Services Living arrangements for the past 2 months: [place] Lives with:: Adult Children(daughter) Patient language and need for interpreter reviewed:: Yes Do you feel safe going back to the place where  you live?: Yes      Need for Family Participation in Patient Care: No (Comment) Care giver support system in place?: Yes (comment) Current home services: DME(walker without wheels, Bedside commode) Criminal Activity/Legal Involvement Pertinent to Current Situation/Hospitalization: No - Comment as needed  Activities of Daily Living Home Assistive Devices/Equipment: None ADL Screening (condition at time of admission) Patient's cognitive ability adequate to safely complete daily activities?: No Is the patient deaf or have difficulty hearing?: No Does the patient have difficulty seeing, even when wearing glasses/contacts?: No Does the patient have difficulty concentrating, remembering, or making decisions?: No Patient able to express need for assistance with ADLs?: No Does the patient have difficulty dressing or bathing?: No Independently performs ADLs?: No Communication: Independent Does the patient have difficulty walking or climbing stairs?: No Weakness of Legs: Left Weakness of Arms/Hands: None  Permission Sought/Granted   Permission granted to share information with : Yes, Verbal Permission Granted              Emotional Assessment Appearance:: Appears stated age Attitude/Demeanor/Rapport: Engaged Affect (typically observed): Appropriate Orientation: : Oriented to Situation, Oriented to Self, Oriented to Place, Oriented to  Time Alcohol / Substance Use: Not Applicable Psych Involvement: No (comment)  Admission diagnosis:  Status post total knee replacement using cement, left [Z96.652] Patient Active Problem List   Diagnosis Date Noted  . Status post total knee replacement  using cement, left 09/21/2019  . Primary osteoarthritis involving multiple joints 08/16/2019  . Bursitis of left shoulder 02/22/2019  . [place] arthritis 02/01/2019  . Carpal tunnel syndrome on right 10/14/2018  . Carpal tunnel syndrome, left 10/14/2018  . Neurogenic pain 03/02/2018  . Tricompartmental  disease of knee (Bilateral) 02/04/2018  . Chronic low back pain (Secondary Area of Pain) (Bilateral) (L>R) 02/04/2018  . Lumbar facet syndrome (Bilateral) (L>R) 02/04/2018  . Spondylosis without myelopathy or radiculopathy, lumbosacral region 02/04/2018  . History of claustrophobia 01/05/2018  . History of panic attacks 01/05/2018  . Chronic shoulder pain (Bilateral) 01/05/2018  . Osteoarthritis of shoulder (Bilateral) 01/05/2018  . Osteoarthritis of knees (Bilateral) 01/05/2018  . Elevated C-reactive protein (CRP) 12/15/2017  . Chronic pain syndrome 12/11/2017  . Pharmacologic therapy 12/11/2017  . Disorder of skeletal system 12/11/2017  . Problems influencing health status 12/11/2017  . Chronic knee pain (Primary Area of Pain) (Bilateral) (L>R) 12/11/2017  . Chronic lower extremity pain [facility] Area of Pain) (Left) 12/11/2017  . Chronic shoulder pain (Fourth Area of Pain) (Right) 12/11/2017  . Chronic hip pain (Left) 12/11/2017  . Fatty liver disease, nonalcoholic 02/72/5366  . DDD (degenerative disc disease), lumbar 06/02/2017  . Chronic low back pain (Midline) 04/22/2017  . CKD (chronic kidney disease) stage 3, GFR 30-59 ml/min 03/28/2017  . BMI 40.0-44.9, adult ([place]) 03/24/2017  . Elevated erythrocyte sedimentation rate 02/12/2017  . Osteoarthritis of knee (Left) 12/26/2016  . Shoulder bursitis (Right) 06/06/2016  . Encounter for long-term (current) use of high-risk medication 03/06/2016  . Long term current use of opiate analgesic 03/06/2016  . Chronic gout 02/26/2016  . Essential hypertension 02/26/2016  . Exercise-induced asthma 02/26/2016  . Fibromyalgia 02/26/2016  . GERD without esophagitis 02/26/2016  . IBS (irritable bowel syndrome) 02/26/2016  . Rheumatoid arthritis involving multiple sites ([place]) 02/26/2016  . Vitamin B12 deficiency 02/26/2016  . Vitamin D deficiency 02/26/2016   PCP:  Sofie Hartigan, MD Pharmacy:   [id number] [place], [place] [address]Phone: [phone]: [phone]    Social Determinants of Health (SDOH) Interventions    Readmission Risk Interventions No flowsheet data found.

## 2019-09-22 NOTE — Evaluation (Signed)
Physical Therapy Evaluation Patient Details Name: Sabrina Green MRN: [id number] DOB: [birth year] Today's Date: 09/22/2019   History of Present Illness  Pt admitted for L TKR.  Clinical Impression  Pt is a pleasant 74 year old female who was admitted for L TKR. Pt performs bed mobility with min assist, transfers with mod assist, and ambulation with min assist and RW. Complaining of numbness in L foot and mid lower leg. Also demonstrates 0/5 strength in ant tib. RN and PA aware. Pt demonstrates ability to perform 10 SLRs with independence, therefore does not require KI for mobility. Does have difficulty stepping with L foot and performs better leading with R foot initially. Pt demonstrates deficits with strength/mobility/ROM/pain. Pt appears to be motivated. Would benefit from skilled PT to address above deficits and promote optimal return to PLOF. Recommend transition to [place] upon discharge from acute hospitalization.     Follow Up Recommendations Home health PT;Supervision/Assistance - 24 hour    Equipment Recommendations  Rolling walker with 5" wheels;3in1 (PT)    Recommendations for Other Services       Precautions / Restrictions Precautions Precautions: Knee;Fall Precaution Booklet Issued: No Restrictions Weight Bearing Restrictions: Yes LLE Weight Bearing: Weight bearing as tolerated      Mobility  Bed Mobility Overal bed mobility: Needs Assistance Bed Mobility: Supine to Sit     Supine to sit: Min assist     General bed mobility comments: assist required for sliding B LEs off bed. Once seated at EOB, able to sit with upright posture.   Transfers Overall transfer level: Needs assistance Equipment used: Rolling walker (2 wheeled) Transfers: Sit to/from Stand Sit to Stand: Mod assist         General transfer comment: needed bed elevated prior to transfer. Initial assist required for ant translation in order to come to upright posture. Slight unsteadiness  noted  Ambulation/Gait Ambulation/Gait assistance: Min assist Gait Distance (Feet): 8 Feet Assistive device: Rolling walker (2 wheeled) Gait Pattern/deviations: Step-to pattern     General Gait Details: ambulated from bed->BSC->chair. Slow step to gait pattern over during ambulation with cues for safe technique. Fatigues quickly.  Stairs            Wheelchair Mobility    Modified Rankin (Stroke Patients Only)       Balance Overall balance assessment: Needs assistance Sitting-balance support: Feet supported Sitting balance-Leahy Scale: Good     Standing balance support: Bilateral upper extremity supported Standing balance-Leahy Scale: Fair Standing balance comment: slight unsteadiness in post direction                             Pertinent Vitals/Pain Pain Assessment: 0-10 Pain Score: 4  Pain Location: L knee Pain Descriptors / Indicators: Operative site guarding Pain Intervention(s): Limited activity within patient's tolerance;Ice applied;Repositioned    Home Living Family/patient expects to be discharged to:: Private residence Living Arrangements: Alone Available Help at Discharge: Family;Available 24 hours/day(daughter plans to stay with her) Type of Home: House Home Access: Level entry     Home Layout: One level Home Equipment: None      Prior Function Level of Independence: Independent         Comments: very limited mobility prior to surgery due to pain. Reports she didn't use any AD, however did limp and self limited her mobility.     Hand Dominance        Extremity/Trunk Assessment   Upper Extremity Assessment Upper  Extremity Assessment: Overall WFL for tasks assessed    Lower Extremity Assessment Lower Extremity Assessment: Generalized weakness;LLE deficits/detail(R LE grossly 3+/5) LLE Deficits / Details: grossly 3/5, however 0/5 DF LLE Sensation: (numbness distal to mid calf)       Communication   Communication: No  difficulties  Cognition Arousal/Alertness: Awake/alert Behavior During Therapy: WFL for tasks assessed/performed Overall Cognitive Status: Within Functional Limits for tasks assessed                                        General Comments      Exercises Total Joint Exercises Goniometric ROM: L knee AAROM: 0-70 degrees Other Exercises Other Exercises: supine ther-ex performed on L LE including quad sets, SLR, and hip abd/add. Attempted AP however unable at this time. 10 reps with min assist. Other Exercises: ambulated over to [facility] with min assist and RW. Increased assist and cues required for turning in order to sit on [facility] safely. Able to perform hygiene with supervision. Safe technique   Assessment/Plan    PT Assessment Patient needs continued PT services  PT Problem List Decreased strength;Decreased range of motion;Decreased activity tolerance;Decreased balance;Decreased mobility;Decreased knowledge of use of DME;Pain       PT Treatment Interventions DME instruction;Gait training;Stair training;Therapeutic exercise;Balance training    PT Goals (Current goals can be found in the Care Plan section)  Acute Rehab PT Goals Patient Stated Goal: to go home PT Goal Formulation: With patient Time For Goal Achievement: 10/06/19 Potential to Achieve Goals: Good    Frequency BID   Barriers to discharge        Co-evaluation               AM-PAC PT "6 Clicks" Mobility  Outcome Measure Help needed turning from your back to your side while in a flat bed without using bedrails?: A Little Help needed moving from lying on your back to sitting on the side of a flat bed without using bedrails?: A Little Help needed moving to and from a bed to a chair (including a wheelchair)?: A Little Help needed standing up from a chair using your arms (e.g., wheelchair or bedside chair)?: A Lot Help needed to walk in hospital room?: A Lot Help needed climbing 3-5 steps with a  railing? : Total 6 Click Score: 14    End of Session Equipment Utilized During Treatment: Gait belt Activity Tolerance: Patient tolerated treatment well Patient left: in chair;with chair alarm set;with SCD's reapplied Nurse Communication: Mobility status PT Visit Diagnosis: Muscle weakness (generalized) (M62.81);Difficulty in walking, not elsewhere classified (R26.2);Pain Pain - Right/Left: Left Pain - part of body: Knee    Time: [id number] PT Time Calculation (min) (ACUTE ONLY): 38 min   Charges:   PT Evaluation $PT Eval Moderate Complexity: 1 Mod PT Treatments $Therapeutic Exercise: 8-22 mins $Therapeutic Activity: 8-22 mins        Greggory Stallion, PT, DPT [phone]   Nubia Ziesmer 09/22/2019, 12:09 PM

## 2019-09-22 NOTE — Progress Notes (Addendum)
Physical Therapy Treatment Patient Details Name: Sabrina Green MRN: [id number] DOB: 11/25/1945 Today's Date: 09/22/2019    History of Present Illness Pt admitted for L TKR.    PT Comments    Pt is making gradual progress towards goals secondary to increased pain this session. RN called and notified of pain meds request. Able to ambulate slight further distance using rw, needs cues for safe technique and sequencing. Still complaining of numbness in L foot and unable to perform dorsiflexion. All mobility performed on RA, however O2 sats decreased to 83%, applied .5L of O2 and pt able to improve sats to 94%. Will continue to progress as able.   Follow Up Recommendations  Home health PT;Supervision/Assistance - 24 hour     Equipment Recommendations  Rolling walker with 5" wheels;3in1 (PT)    Recommendations for Other Services       Precautions / Restrictions Precautions Precautions: Knee;Fall Precaution Booklet Issued: Yes (comment) Restrictions Weight Bearing Restrictions: Yes LLE Weight Bearing: Weight bearing as tolerated    Mobility  Bed Mobility Overal bed mobility: Needs Assistance Bed Mobility: Supine to Sit;Sit to Supine     Supine to sit: Min assist Sit to supine: Min assist   General bed mobility comments: needs assist for B LE management. Once seated, able to scoot out towards EOB. Again needs assist for transitioning back into bed and is limited due to pain.  Transfers Overall transfer level: Needs assistance Equipment used: Rolling walker (2 wheeled) Transfers: Sit to/from Stand Sit to Stand: Min assist         General transfer comment: decreased assist required for transfer this date, however does still require bed to be elevated with transfer. Once standing, upright posture noted  Ambulation/Gait Ambulation/Gait assistance: Min assist Gait Distance (Feet): 80 Feet Assistive device: Rolling walker (2 wheeled) Gait Pattern/deviations: Step-through  pattern     General Gait Details: slow gait speed, requires cues to stay close to RW. Able to demonstrate reciprocal gait pattern. 1 standing rest break required.   Stairs             Wheelchair Mobility    Modified Rankin (Stroke Patients Only)       Balance Overall balance assessment: Needs assistance Sitting-balance support: Feet supported Sitting balance-Leahy Scale: Good     Standing balance support: Bilateral upper extremity supported Standing balance-Leahy Scale: Fair Standing balance comment: slight unsteadiness in post direction                            Cognition Arousal/Alertness: Awake/alert Behavior During Therapy: WFL for tasks assessed/performed Overall Cognitive Status: Within Functional Limits for tasks assessed                                        Exercises Total Joint Exercises Goniometric ROM: L knee AAROM: 0-70 degrees Other Exercises Other Exercises: supine ther-ex performed on L LE including quad sets, SLR, SAQ, and hip abd/add. Attempted AP however unable at this time. 10 reps with min assist. Other Exercises: ambulated over to [facility] with min assist and RW. Increased assist and cues required for turning in order to sit on [facility] safely. Able to perform hygiene with supervision. Safe technique    General Comments        Pertinent Vitals/Pain Pain Assessment: 0-10 Pain Score: 5  Pain Location: L knee Pain Descriptors /  Indicators: Operative site guarding Pain Intervention(s): Limited activity within patient's tolerance;Repositioned;Ice applied;Patient requesting pain meds-RN notified    Home Living Family/patient expects to be discharged to:: Private residence Living Arrangements: Alone Available Help at Discharge: Family;Available 24 hours/day(daughter plans to stay with her) Type of Home: House Home Access: Level entry   Home Layout: One level Home Equipment: None      Prior Function Level of  Independence: Independent      Comments: very limited mobility prior to surgery due to pain. Reports she didn't use any AD, however did limp and self limited her mobility.   PT Goals (current goals can now be found in the care plan section) Acute Rehab PT Goals Patient Stated Goal: to go home PT Goal Formulation: With patient Time For Goal Achievement: 10/06/19 Potential to Achieve Goals: Good Additional Goals Additional Goal #1: Pt will be able to perform bed mobility/transfers with supervision and safe technique in order to improve functional independence using RW. Progress towards PT goals: Progressing toward goals    Frequency    BID      PT Plan Current plan remains appropriate    Co-evaluation              AM-PAC PT "6 Clicks" Mobility   Outcome Measure  Help needed turning from your back to your side while in a flat bed without using bedrails?: A Little Help needed moving from lying on your back to sitting on the side of a flat bed without using bedrails?: A Little Help needed moving to and from a bed to a chair (including a wheelchair)?: A Little Help needed standing up from a chair using your arms (e.g., wheelchair or bedside chair)?: A Lot Help needed to walk in hospital room?: A Lot Help needed climbing 3-5 steps with a railing? : Total 6 Click Score: 14    End of Session Equipment Utilized During Treatment: Gait belt Activity Tolerance: Patient tolerated treatment well Patient left: in bed;with bed alarm set Nurse Communication: Mobility status PT Visit Diagnosis: Muscle weakness (generalized) (M62.81);Difficulty in walking, not elsewhere classified (R26.2);Pain Pain - Right/Left: Left Pain - part of body: Knee     Time: [id number] PT Time Calculation (min) (ACUTE ONLY): 38 min  Charges:  $Gait Training: 23-37 mins $Therapeutic Exercise: 8-22 mins $Therapeutic Activity: 8-22 mins                     Greggory Stallion, PT,  DPT [phone]    [name] 09/22/2019, 2:23 PM

## 2019-09-22 NOTE — Progress Notes (Signed)
  Subjective: 1 Day Post-Op Procedure(s) (LRB): TOTAL KNEE ARTHROPLASTY (Left) Patient reports pain as 5 on 0-10 scale.   Patient is well but is reporting numbness in her left foot Plan is to go Home after hospital stay. Negative for chest pain and shortness of breath Fever: no Gastrointestinal:Negative for nausea and vomiting  Objective: Vital signs in last 24 hours: Temp:  [97.1 F (36.2 C)-98.6 F (37 C)] 97.6 F (36.4 C) (02/24 0725) Pulse Rate:  [57-73] 57 (02/24 0725) Resp:  [11-18] 18 (02/24 0725) BP: (117-154)/(48-89) 117/53 (02/24 0725) SpO2:  [95 %-100 %] 100 % (02/24 0725)  Intake/Output from previous day:  Intake/Output Summary (Last 24 hours) at 09/22/2019 0848 Last data filed at 09/22/2019 0549 Gross per 24 hour  Intake 2175 ml  Output 20 ml  Net 2155 ml    Intake/Output this shift: No intake/output data recorded.  Labs: Recent Labs    09/22/19 0324  HGB 11.3*   Recent Labs    09/22/19 0324  WBC 11.8*  RBC 3.95  HCT 36.4  PLT 135*   Recent Labs    09/22/19 0324  NA 141  K 4.9  CL 106  CO2 23  BUN 37*  CREATININE 1.90*  GLUCOSE 164*  CALCIUM 8.5*   No results for input(s): LABPT, INR in the last 72 hours.   EXAM General - Patient is Alert, Appropriate and Oriented Extremity - ABD soft Intact pulses distally Incision: scant drainage No cellulitis present  Decreased sensation to light touch to the superficial and deep peroneal nerve distribution. Able to plantarflex the foot with 4/5 strength, no active dorsiflexion.  After flexing the knee and several minutes she was able to dorsiflex with 2/5 strength. Cap refill intact to each digit.  Flexing and extending toes on command. Dressing/Incision - blood tinged drainage  Past Medical History:  Diagnosis Date  . Anemia    vitamin b and d deficiency  . Anxiety   . Arthritis    osteoarthrits  . Chronic pain syndrome   . CKD (chronic kidney disease) stage 3, GFR 30-59 ml/min   .  Complication of anesthesia    difficulty waking up  . DDD (degenerative disc disease), cervical   . Fibromyalgia    not accurate, per patient  . GERD (gastroesophageal reflux disease)   . Gout   . Hearing loss   . History of kidney stones 1987  . Hypertension   . Rheumatoid arthritis (HCC)    orencia infusions  . Vitamin B12 deficiency   . Vitamin D deficiency     Assessment/Plan: 1 Day Post-Op Procedure(s) (LRB): TOTAL KNEE ARTHROPLASTY (Left) Active Problems:   Status post total knee replacement using cement, left  Estimated body mass index is 38.77 kg/m as calculated from the following:   Height as of 09/14/19: 5\' 5"  (1.651 m).   Weight as of 09/14/19: 105.7 kg. Advance diet Up with therapy   Labs reviewed this AM.  CBC and BMP ordered for tomorrow morning. AKI, Cr 1.90 and BUN 37.  Continue with gentle IV fluids today.  Will re-check tomorrow. Acute foot drop, likely from numbing medication given during surgery.  ACE wrap removed and knee flexed.  On re-check both sensation and motor function were improving.  Continue to monitor. Begin working on BM. Continue with PT today.  DVT Prophylaxis - Lovenox and Foot Pumps Weight-Bearing as tolerated to left leg  J. Cameron Proud, PA-C [facility] 09/22/2019, 8:48 AM

## 2019-09-22 NOTE — TOC Benefit Eligibility Note (Signed)
Transition of Care [facility]) Benefit Eligibility Note    Patient Details  Name: Sabrina Green MRN: [id number] Date of Birth: [birth year]   Medication/Dose: Lovenox 40 mg daily x 14 day or generic           Spoke with Person/Company/Phone Number:: Demetrius Charity, [employer], [phone]           Additional Notes: Account shows inactive. Pt. needs to contact [employer] [phone] to update her information. Unable to obtain the co-pay due to this matter.    [place] Phone Number: 09/22/2019, 8:44 AM

## 2019-09-22 NOTE — Plan of Care (Signed)
  Problem: Education: Goal: Knowledge of General Education information will improve Description: Including pain rating scale, medication(s)/side effects and non-pharmacologic comfort measures Outcome: Progressing   Problem: Clinical Measurements: Goal: Ability to maintain clinical measurements within normal limits will improve Outcome: Progressing Goal: Will remain free from infection Outcome: Progressing Goal: Diagnostic test results will improve Outcome: Progressing   Problem: Activity: Goal: Risk for activity intolerance will decrease Outcome: Progressing   Problem: Coping: Goal: Level of anxiety will decrease Outcome: Progressing   Problem: Pain Managment: Goal: General experience of comfort will improve Outcome: Progressing   Problem: Safety: Goal: Ability to remain free from injury will improve Outcome: Progressing Started on CPM machine at 2320 (09/21/2019), will remove at 0720 am.

## 2019-09-22 NOTE — Progress Notes (Signed)
Nutrition Brief Note  Patient identified on the Malnutrition Screening Tool (MST) Report  Wt Readings from Last 15 Encounters:  09/14/19 105.7 kg  09/09/19 105.7 kg  06/13/19 105.7 kg  06/01/19 105.7 kg  09/07/18 111.1 kg  08/20/18 108.9 kg  08/06/18 111.1 kg  07/14/18 111.1 kg  06/30/18 108.9 kg  06/09/18 111.1 kg  05/21/18 108.9 kg  03/02/18 111.6 kg  02/17/18 113.4 kg  02/04/18 113.4 kg  01/15/18 111.6 kg    Met with patient at bedside. She reports her appetite is good and unchanged from baseline. She is eating well. According to chart she ate 80% of her breakfast. Patient reports she is weight-stable. Patient does not meet criteria for malnutrition at this time.  There is no height or weight on file to calculate BMI.   Current diet order is heart healthy/carbohydrate modified, patient is consuming approximately 80% of meals at this time. Labs and medications reviewed.   No nutrition interventions warranted at this time. If nutrition issues arise, please consult RD.   Jacklynn Barnacle, MS, RD, LDN Pager number available on Amion

## 2019-09-22 NOTE — Anesthesia Postprocedure Evaluation (Signed)
Anesthesia Post Note  Patient: Sabrina Green  Procedure(s) Performed: TOTAL KNEE ARTHROPLASTY (Left Knee)  Patient location during evaluation: PACU Anesthesia Type: General Level of consciousness: awake and alert Pain management: pain level controlled Vital Signs Assessment: post-procedure vital signs reviewed and stable Respiratory status: spontaneous breathing, nonlabored ventilation and respiratory function stable Cardiovascular status: blood pressure returned to baseline and stable Postop Assessment: no apparent nausea or vomiting Anesthetic complications: no     Last Vitals:  Vitals:   09/22/19 0405 09/22/19 0725  BP: (!) 117/51 (!) 117/53  Pulse: (!) 57 (!) 57  Resp: 17 18  Temp: 36.8 C 36.4 C  SpO2: 98% 100%    Last Pain:  Vitals:   09/22/19 0725  TempSrc: Oral  PainSc:                  Tera Mater

## 2019-09-22 NOTE — Progress Notes (Signed)
D: Pt alert and oriented x 4. Pt denies experiencing any pain at this time.   A: Scheduled medications administered to pt, per MD orders. Support and encouragement provided. Frequent verbal contact made.   R: No adverse drug reactions noted. Pt complaint with medications and treatment plan. Pt interacts well with staff on the unit. Pt stable at this time, will continue to monitor and provide care for as ordered.  Pt OOB with pt today. Pt reports she feels as though the feeling is coming back as she gets up and works with pt. Pt reports experiencing less numbness.

## 2019-09-23 ENCOUNTER — Ambulatory Visit: Payer: Medicare Other | Admitting: Pain Medicine

## 2019-09-23 LAB — CBC
HCT: 34.7 % — ABNORMAL LOW (ref 36.0–46.0)
Hemoglobin: 10.8 g/dL — ABNORMAL LOW (ref 12.0–15.0)
MCH: 28.3 pg (ref 26.0–34.0)
MCHC: 31.1 g/dL (ref 30.0–36.0)
MCV: 91.1 fL (ref 80.0–100.0)
Platelets: 135 10*3/uL — ABNORMAL LOW (ref 150–400)
RBC: 3.81 MIL/uL — ABNORMAL LOW (ref 3.87–5.11)
RDW: 14.9 % (ref 11.5–15.5)
WBC: 7.9 10*3/uL (ref 4.0–10.5)
nRBC: 0 % (ref 0.0–0.2)

## 2019-09-23 LAB — BASIC METABOLIC PANEL
Anion gap: 8 (ref 5–15)
BUN: 45 mg/dL — ABNORMAL HIGH (ref 8–23)
CO2: 26 mmol/L (ref 22–32)
Calcium: 8.2 mg/dL — ABNORMAL LOW (ref 8.9–10.3)
Chloride: 107 mmol/L (ref 98–111)
Creatinine, Ser: 1.88 mg/dL — ABNORMAL HIGH (ref 0.44–1.00)
GFR calc Af Amer: 31 mL/min — ABNORMAL LOW (ref >60)
GFR calc non Af Amer: 26 mL/min — ABNORMAL LOW (ref >60)
Glucose, Bld: 91 mg/dL (ref 70–99)
Potassium: 4.4 mmol/L (ref 3.5–5.1)
Sodium: 141 mmol/L (ref 135–145)

## 2019-09-23 MED ORDER — OXYCODONE HCL 5 MG PO TABS: 5.0000 mg | ORAL_TABLET | ORAL | 0 refills | Status: DC | PRN

## 2019-09-23 MED ORDER — ENOXAPARIN SODIUM 40 MG/0.4ML ~~LOC~~ SOLN: 40.0000 mg | SUBCUTANEOUS | 0 refills | Status: DC

## 2019-09-23 MED ORDER — TRAMADOL HCL 50 MG PO TABS: 50.0000 mg | ORAL_TABLET | Freq: Four times a day (QID) | ORAL | 0 refills | Status: DC | PRN

## 2019-09-23 NOTE — Progress Notes (Signed)
Occupational Therapy Treatment Patient Details Name: Sabrina Green MRN: [id number] DOB: 1946/07/27 Today's Date: 09/23/2019    History of present illness Pt is 74 y/o F admitted for L TKR.   OT comments  Pt seen for OT tx this date to f/u re: post op ADL education including use of AE for LB ADLs and polar care/compression stocking mgt. Pt with increasd pain noted during PT this date per note and given pain medication. Pt self reports feeling somewhat drowsy after medication and during OT tx session. Pt Requires MIN verbal cues to attend intermittently, but mostly verbalizes appropriate understanding of education provided. Pt not agreeable to OOB activity this date, but is agreeable to modified ADL education. OT facilitates demonstration and issues handout as well to encourage improved carryover/reception of education. Activity with OT this date was limited, but HHOT appears to still be most reasonable d/c recommendation given pt's support available.    Follow Up Recommendations  Supervision - Intermittent;Home health OT    Equipment Recommendations  3 in 1 bedside commode;Other (comment)(AE for LB dressing.)    Recommendations for Other Services      Precautions / Restrictions Precautions Precautions: Knee;Fall Precaution Booklet Issued: Yes (comment) Precaution Comments: OT post op knee packet issued 2/25 Restrictions Weight Bearing Restrictions: Yes LLE Weight Bearing: Weight bearing as tolerated       Mobility Bed Mobility Overal bed mobility: Needs Assistance Bed Mobility: Supine to Sit;Sit to Supine     Supine to sit: Min assist;Mod assist Sit to supine: Mod assist   General bed mobility comments: deferred  Transfers Overall transfer level: Needs assistance Equipment used: Rolling walker (2 wheeled) Transfers: Sit to/from Stand Sit to Stand: Mod assist;From elevated surface         General transfer comment: deferred    Balance                                            ADL either performed or assessed with clinical judgement   ADL                                         General ADL Comments: Pt with c/o increased pain this date during physical therapy. declines getting OOB at this time, some drowsiness from pain meds, agreeable to LB ADL AE education     Vision Baseline Vision/History: Wears glasses Wears Glasses: Reading only Patient Visual Report: No change from baseline     Perception     Praxis      Cognition Arousal/Alertness: Awake/alert Behavior During Therapy: WFL for tasks assessed/performed Overall Cognitive Status: Within Functional Limits for tasks assessed                                 General Comments: A&O, some drowsiness noted during OT session this date.        Exercises Other Exercises Other Exercises: OT facilitates education re: LB ADL AE as well as polar care/compression stocking mgt. Extended time and multi-approach education including handout and demonstration with MIN verbal cues for pt attention 2/2 fatigue. Pt verbalized appropriate understanding, would benefit from teach back method approach to ensure understanding.   Shoulder Instructions  General Comments      Pertinent Vitals/ Pain       Pain Assessment: 0-10 Pain Score: 3  Pain Location: L knee 7/10 pain today with mobility during PT, states 3/10 at rest after pain medication during OT tx Pain Descriptors / Indicators: Sore;Aching Pain Intervention(s): Monitored during session  Home Living                                          Prior Functioning/Environment              Frequency  Min 2X/week        Progress Toward Goals  OT Goals(current goals can now be found in the care plan section)  Progress towards OT goals: Progressing toward goals  Acute Rehab OT Goals Patient Stated Goal: To go home tomorrow OT Goal Formulation: With patient Time For  Goal Achievement: 10/06/19 Potential to Achieve Goals: Good  Plan Discharge plan remains appropriate    Co-evaluation                 AM-PAC OT "6 Clicks" Daily Activity     Outcome Measure   Help from another person eating meals?: None Help from another person taking care of personal grooming?: A Little Help from another person toileting, which includes using toliet, bedpan, or urinal?: A Little Help from another person bathing (including washing, rinsing, drying)?: A Little Help from another person to put on and taking off regular upper body clothing?: A Lot Help from another person to put on and taking off regular lower body clothing?: A Little 6 Click Score: 18    End of Session    OT Visit Diagnosis: Other abnormalities of gait and mobility (R26.89);Pain Pain - Right/Left: Left Pain - part of body: Knee   Activity Tolerance Patient limited by fatigue(recieved pain medication after physical therapy. Pt self reports feeling somewhat drowsy after pain medication)   Patient Left in bed;with call bell/phone within reach;with bed alarm set   Nurse Communication          Time: 1546-1610 OT Time Calculation (min): 24 min  Charges: OT General Charges $OT Visit: 1 Visit OT Treatments $Self Care/Home Management : 23-37 mins  Gerrianne Scale, [place], OTR/L ascom [phone] 09/23/19, 4:29 PM

## 2019-09-23 NOTE — Plan of Care (Signed)
Patient OOB to the chair with walker and one assist. Tolerating fluids and diet well. Will continue to monitor.  Sabrina Green

## 2019-09-23 NOTE — Progress Notes (Signed)
Physical Therapy Treatment Patient Details Name: Sabrina Green MRN: [id number] DOB: May 29, 1946 Today's Date: 09/23/2019    History of Present Illness Pt admitted for L TKR.    PT Comments    Pt was supine in bed upon arriving with daughter present. She agrees to PT session and is requesting to use BR. Reports 6/10 pain at rest. Therapist asked if pt would like to hold PT until pain meds issued however pt wanted to try without. Unable to ambulate to BR and required increased assistance with bed mobility and transfers. She was able to stand step to [facility] only with mod assist + max vcs and gait belt. Therapist contacted RN to issued pain medication. Therapist will attempt to return later this date to progress pt with all safe functional mobility. If unable to progress, D/C disposition may need to be addressed.     Follow Up Recommendations  Home health PT;Supervision/Assistance - 24 hour     Equipment Recommendations  Rolling walker with 5" wheels;3in1 (PT)    Recommendations for Other Services       Precautions / Restrictions Precautions Precautions: Knee;Fall Precaution Booklet Issued: Yes (comment) Restrictions Weight Bearing Restrictions: Yes LLE Weight Bearing: Weight bearing as tolerated    Mobility  Bed Mobility Overal bed mobility: Needs Assistance Bed Mobility: Supine to Sit;Sit to Supine     Supine to sit: Min assist;Mod assist Sit to supine: Mod assist   General bed mobility comments: Pt required significant increase in assistance this afternoon versus morning. She c/o pain increased to 10/10 and RN notified and issued medication. Pt requested to use BR.   Transfers Overall transfer level: Needs assistance Equipment used: Rolling walker (2 wheeled) Transfers: Sit to/from Stand Sit to Stand: Mod assist;From elevated surface         General transfer comment: Pt required alot more assist this afternoon with transfers versus this morning. Pt was able to stand  at EOB and take 3 steps to turn to [facility]. unable to ambulate into bathroom 2/2 to pain. She had difficulty clearing floor this afternoon.   Ambulation/Gait             General Gait Details: unable to ambulate this afternoon   Stairs             Wheelchair Mobility    Modified Rankin (Stroke Patients Only)       Balance                                            Cognition Arousal/Alertness: Awake/alert Behavior During Therapy: WFL for tasks assessed/performed Overall Cognitive Status: Within Functional Limits for tasks assessed                                 General Comments: Pt agreeable to PT session however reports increased pain from AM session. Continues to be A and O x 4.      Exercises      General Comments        Pertinent Vitals/Pain Pain Assessment: 0-10 Pain Score: 7  Pain Location: L knee Pain Descriptors / Indicators: Sore;Aching Pain Intervention(s): Limited activity within patient's tolerance;Monitored during session;RN gave pain meds during session;Repositioned;Ice applied    Home Living  Prior Function            PT Goals (current goals can now be found in the care plan section) Acute Rehab PT Goals Patient Stated Goal: To go home tomorrow Progress towards PT goals: Progressing toward goals    Frequency    BID      PT Plan Current plan remains appropriate    Co-evaluation              AM-PAC PT "6 Clicks" Mobility   Outcome Measure  Help needed turning from your back to your side while in a flat bed without using bedrails?: A Lot Help needed moving from lying on your back to sitting on the side of a flat bed without using bedrails?: A Lot Help needed moving to and from a bed to a chair (including a wheelchair)?: A Lot Help needed standing up from a chair using your arms (e.g., wheelchair or bedside chair)?: A Lot Help needed to walk in hospital room?: A  Lot Help needed climbing 3-5 steps with a railing? : A Lot 6 Click Score: 12    End of Session Equipment Utilized During Treatment: Gait belt Activity Tolerance: Patient limited by pain Patient left: in bed;with nursing/sitter in room;with bed alarm set;with call bell/phone within reach;with SCD's reapplied;with family/visitor present Nurse Communication: Mobility status PT Visit Diagnosis: Muscle weakness (generalized) (M62.81);Difficulty in walking, not elsewhere classified (R26.2);Pain Pain - Right/Left: Left Pain - part of body: Knee     Time: [id number] PT Time Calculation (min) (ACUTE ONLY): 33 min  Charges:  $Therapeutic Activity: 23-37 mins                     Julaine Fusi PTA 09/23/19, 2:28 PM

## 2019-09-23 NOTE — Progress Notes (Signed)
Patient is not tolerating the CPM machine. She does not want it on. Patient up to [facility] with one assist.  Will continue to monitor.  Sabrina Green 09/23/2019  12:20 AM

## 2019-09-23 NOTE — Progress Notes (Signed)
  Subjective: 2 Days Post-Op Procedure(s) (LRB): TOTAL KNEE ARTHROPLASTY (Left) Patient reports pain as moderate in the left knee Patient is well but is having increased left knee pain, numbness in the left foot has resolved. Plan is to go Home after hospital stay. Negative for chest pain and shortness of breath Fever: no Gastrointestinal:Negative for nausea and vomiting  Objective: Vital signs in last 24 hours: Temp:  [97.8 F (36.6 C)-98.6 F (37 C)] 98.2 F (36.8 C) (02/25 0806) Pulse Rate:  [62-73] 73 (02/25 0806) Resp:  [16-17] 17 (02/25 0806) BP: (117-142)/(56-60) 117/58 (02/25 0806) SpO2:  [96 %-100 %] 96 % (02/25 0806)  Intake/Output from previous day:  Intake/Output Summary (Last 24 hours) at 09/23/2019 1258 Last data filed at 09/22/2019 1333 Gross per 24 hour  Intake 200 ml  Output --  Net 200 ml    Intake/Output this shift: No intake/output data recorded.  Labs: Recent Labs    09/22/19 0324 09/23/19 0337  HGB 11.3* 10.8*   Recent Labs    09/22/19 0324 09/23/19 0337  WBC 11.8* 7.9  RBC 3.95 3.81*  HCT 36.4 34.7*  PLT 135* 135*   Recent Labs    09/22/19 0324 09/23/19 0337  NA 141 141  K 4.9 4.4  CL 106 107  CO2 23 26  BUN 37* 45*  CREATININE 1.90* 1.88*  GLUCOSE 164* 91  CALCIUM 8.5* 8.2*   No results for input(s): LABPT, INR in the last 72 hours.  EXAM General - Patient is Alert, Appropriate and Oriented Extremity - ABD soft Neurovascular intact Sensation intact distally Intact pulses distally Dorsiflexion/Plantar flexion intact Incision: scant drainage No cellulitis present  Dressing/Incision - blood tinged drainage Patient is able to flex and extend foot this morning without pain.  Past Medical History:  Diagnosis Date  . Anemia    vitamin b and d deficiency  . Anxiety   . Arthritis    osteoarthrits  . Chronic pain syndrome   . CKD (chronic kidney disease) stage 3, GFR 30-59 ml/min   . Complication of anesthesia     difficulty waking up  . DDD (degenerative disc disease), cervical   . Fibromyalgia    not accurate, per patient  . GERD (gastroesophageal reflux disease)   . Gout   . Hearing loss   . History of kidney stones 1987  . Hypertension   . Rheumatoid arthritis (HCC)    orencia infusions  . Vitamin B12 deficiency   . Vitamin D deficiency     Assessment/Plan: 2 Days Post-Op Procedure(s) (LRB): TOTAL KNEE ARTHROPLASTY (Left) Active Problems:   Status post total knee replacement using cement, left  Estimated body mass index is 38.77 kg/m as calculated from the following:   Height as of 09/14/19: 5\' 5"  (1.651 m).   Weight as of 09/14/19: 105.7 kg. Advance diet Up with therapy   Labs reviewed this AM.  CBC and BMP ordered for tomorrow morning. AKI, Cr 1.88 and BUN 45.  Patient is eating well, encouraged increased oral liquid intake. Acute foot drop has resolved to the left foot. Begin working on BM. Continue with PT today.  DVT Prophylaxis - Lovenox and Foot Pumps Weight-Bearing as tolerated to left leg  J. Cameron Proud, PA-C [facility] Orthopaedic Surgery 09/23/2019, 12:58 PM

## 2019-09-23 NOTE — Progress Notes (Signed)
Physical Therapy Treatment Patient Details Name: Sabrina Green MRN: [id number] DOB: 01-06-1946 Today's Date: 09/23/2019    History of Present Illness Pt admitted for L TKR.    PT Comments    Pt was supine in bed upon arriving. She agrees to PT session and is cooperative and pleasant throughout. Pt is A and O x 4. Reports 3/10 pain at rest that increased to 5/10 in after gait training. O2 > 92 % throughout session. Occasional vcs for PLB.  Pt was able to exit bed, supine to short sit with supervision but did require min assist when getting back into bed with LE support. She stood and ambulated 150 ft with RW + gait belt + CGA for safety. No LOB or unsteadiness during gait training. Once returned to room, pt perform There ex handout and tolerated well ( see there ex listed below). AROM: lacks 5 degrees ext and has flexion to 84 after performing stretching. Overall pt tolerated session well and is progressing with PT. Recommend D/C to home with HHPT to address strength, ROM, and safe functional mobility deficits. Pt was repositioned in bed with bed alarm placed, polar care reapplied, call bell in reach, SCDs donned, and RN tech in room. PT will continue to follow pt per POC.      Follow Up Recommendations  Home health PT;Supervision - Intermittent     Equipment Recommendations  Rolling walker with 5" wheels;3in1 (PT)    Recommendations for Other Services       Precautions / Restrictions Precautions Precautions: Knee;Fall Precaution Booklet Issued: Yes (comment) Restrictions Weight Bearing Restrictions: Yes LLE Weight Bearing: Weight bearing as tolerated    Mobility  Bed Mobility Overal bed mobility: Needs Assistance Bed Mobility: Supine to Sit;Sit to Supine     Supine to sit: Supervision Sit to supine: Min assist   General bed mobility comments: Pt was able to exit bed with supervision but did require min assist to return to supine from EOB sitting. Increased time to perform  but overall tolerated well. O2 92% seated EOB. No c/o dizziness/lighteaded  Transfers Overall transfer level: Needs assistance Equipment used: Rolling walker (2 wheeled) Transfers: Sit to/from Stand Sit to Stand: Min guard         General transfer comment: From slightly elevated bed height, pt demonstarted ability to STS with CGA for safety only. Vcs for proper handplacement and improved technique. She did require min assist to return to supine from EOB with therapist supporting LLE back into bed.   Ambulation/Gait Ambulation/Gait assistance: Min guard Gait Distance (Feet): 150 Feet Assistive device: Rolling walker (2 wheeled) Gait Pattern/deviations: Step-through pattern Gait velocity: decreased   General Gait Details: Pt demonstrated safe steady gait kinematics but does require CGA for safety. Vcs during gait for posture correction and staying inside RW. she tolerated gait training well but did c/o 5/10 pain afterwards.   Stairs             Wheelchair Mobility    Modified Rankin (Stroke Patients Only)       Balance                                            Cognition Arousal/Alertness: Awake/alert Behavior During Therapy: WFL for tasks assessed/performed Overall Cognitive Status: Within Functional Limits for tasks assessed  General Comments: Pt is A and O x 4 and able to follow multistep commands well. Pt very pleasant and cooperative      Exercises Total Joint Exercises Ankle Circles/Pumps: AROM;Both;20 reps Quad Sets: AROM;Left;10 reps;Supine Heel Slides: AROM;Left;10 reps;Supine Hip ABduction/ADduction: Left;10 reps;Supine;AROM Straight Leg Raises: AAROM;Left;10 reps;Supine Knee Flexion: AAROM;Left;5 reps;Seated Goniometric ROM: -5 to 84    General Comments        Pertinent Vitals/Pain Pain Assessment: 0-10 Pain Score: 3  Pain Location: L knee Pain Descriptors / Indicators:  Sore;Aching Pain Intervention(s): Limited activity within patient's tolerance;Monitored during session;Premedicated before session;Ice applied    Home Living                      Prior Function            PT Goals (current goals can now be found in the care plan section) Acute Rehab PT Goals Patient Stated Goal: to go home Progress towards PT goals: Progressing toward goals    Frequency    BID      PT Plan Current plan remains appropriate    Co-evaluation              AM-PAC PT "6 Clicks" Mobility   Outcome Measure  Help needed turning from your back to your side while in a flat bed without using bedrails?: A Little Help needed moving from lying on your back to sitting on the side of a flat bed without using bedrails?: A Little Help needed moving to and from a bed to a chair (including a wheelchair)?: A Little Help needed standing up from a chair using your arms (e.g., wheelchair or bedside chair)?: A Little Help needed to walk in hospital room?: A Little Help needed climbing 3-5 steps with a railing? : A Little 6 Click Score: 18    End of Session Equipment Utilized During Treatment: Gait belt Activity Tolerance: Patient tolerated treatment well Patient left: in bed;with bed alarm set Nurse Communication: Mobility status PT Visit Diagnosis: Muscle weakness (generalized) (M62.81);Difficulty in walking, not elsewhere classified (R26.2);Pain Pain - Right/Left: Left Pain - part of body: Knee     Time: [id number] PT Time Calculation (min) (ACUTE ONLY): 40 min  Charges:  $Gait Training: 23-37 mins $Therapeutic Exercise: 8-22 mins                     Julaine Fusi PTA 09/23/19, 10:44 AM

## 2019-09-24 LAB — BASIC METABOLIC PANEL
Anion gap: 8 (ref 5–15)
BUN: 38 mg/dL — ABNORMAL HIGH (ref 8–23)
CO2: 24 mmol/L (ref 22–32)
Calcium: 8.6 mg/dL — ABNORMAL LOW (ref 8.9–10.3)
Chloride: 107 mmol/L (ref 98–111)
Creatinine, Ser: 1.66 mg/dL — ABNORMAL HIGH (ref 0.44–1.00)
GFR calc Af Amer: 36 mL/min — ABNORMAL LOW (ref >60)
GFR calc non Af Amer: 31 mL/min — ABNORMAL LOW (ref >60)
Glucose, Bld: 100 mg/dL — ABNORMAL HIGH (ref 70–99)
Potassium: 4.5 mmol/L (ref 3.5–5.1)
Sodium: 139 mmol/L (ref 135–145)

## 2019-09-24 LAB — CBC
HCT: 34.3 % — ABNORMAL LOW (ref 36.0–46.0)
Hemoglobin: 10.6 g/dL — ABNORMAL LOW (ref 12.0–15.0)
MCH: 28.4 pg (ref 26.0–34.0)
MCHC: 30.9 g/dL (ref 30.0–36.0)
MCV: 92 fL (ref 80.0–100.0)
Platelets: 144 10*3/uL — ABNORMAL LOW (ref 150–400)
RBC: 3.73 MIL/uL — ABNORMAL LOW (ref 3.87–5.11)
RDW: 14.9 % (ref 11.5–15.5)
WBC: 8.4 10*3/uL (ref 4.0–10.5)
nRBC: 0 % (ref 0.0–0.2)

## 2019-09-24 LAB — RESPIRATORY PANEL BY RT PCR (FLU A&B, COVID)
Influenza A by PCR: NEGATIVE
Influenza B by PCR: NEGATIVE
SARS Coronavirus 2 by RT PCR: NEGATIVE

## 2019-09-24 NOTE — NC FL2 (Signed)
[place] LEVEL OF CARE SCREENING TOOL     IDENTIFICATION  Patient Name: Sabrina Green Birthdate: 1945-11-04 Sex: female Admission Date (Current Location): 09/21/2019  [place] and [place] Number:  Engineering geologist and Address:  [facility], [address] [place], [place] [address]      Provider Number: [id number]  Attending Physician Name and Address:  Corky Mull, MD  Relative Name and Phone Number:  Andee Poles [phone]    Current Level of Care: Hospital Recommended Level of Care: [place] Prior Approval Number:    Date Approved/Denied:   PASRR Number: [id number]  Discharge Plan: SNF    Current Diagnoses: Patient Active Problem List   Diagnosis Date Noted  . 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 Primary osteoarthritis involving multiple joints 08/16/2019  . Bursitis of left shoulder 02/22/2019  . [place] arthritis 02/01/2019  . Carpal tunnel syndrome on right 10/14/2018  . Carpal tunnel syndrome, left 10/14/2018  . Neurogenic pain 03/02/2018  . Tricompartmental disease of knee (Bilateral) 02/04/2018  . Chronic low back pain (Secondary Area of Pain) (Bilateral) (L>R) 02/04/2018  . Lumbar facet syndrome (Bilateral) (L>R) 02/04/2018  . Spondylosis without myelopathy or radiculopathy, lumbosacral region 02/04/2018  . History of claustrophobia 01/05/2018  . History of panic attacks 01/05/2018  . Chronic shoulder pain (Bilateral) 01/05/2018  . Osteoarthritis of shoulder (Bilateral) 01/05/2018  . Osteoarthritis of knees (Bilateral) 01/05/2018  . Elevated C-reactive protein (CRP) 12/15/2017  . Chronic pain syndrome 12/11/2017  . Pharmacologic therapy 12/11/2017  . Disorder of skeletal system 12/11/2017  . Problems influencing health status 12/11/2017  . Chronic knee pain (Primary Area of Pain) (Bilateral) (L>R) 12/11/2017  . Chronic lower extremity pain [facility] Area of  Pain) (Left) 12/11/2017  . Chronic shoulder pain (Fourth Area of Pain) (Right) 12/11/2017  . Chronic hip pain (Left) 12/11/2017  . Fatty liver disease, nonalcoholic 123456  . DDD (degenerative disc disease), lumbar 06/02/2017  . Chronic low back pain (Midline) 04/22/2017  . CKD (chronic kidney disease) stage 3, GFR 30-59 ml/min 03/28/2017  . BMI 40.0-44.9, adult ([place]) 03/24/2017  . Elevated erythrocyte sedimentation rate 02/12/2017  . Osteoarthritis of knee (Left) 12/26/2016  . Shoulder bursitis (Right) 06/06/2016  . Encounter for long-term (current) use of high-risk medication 03/06/2016  . Long term current use of opiate analgesic 03/06/2016  . Chronic gout 02/26/2016  . Essential hypertension 02/26/2016  . Exercise-induced asthma 02/26/2016  . Fibromyalgia 02/26/2016  . GERD without esophagitis 02/26/2016  . IBS (irritable bowel syndrome) 02/26/2016  . Rheumatoid arthritis involving multiple sites ([place]) 02/26/2016  . Vitamin B12 deficiency 02/26/2016  . Vitamin D deficiency 02/26/2016    Orientation RESPIRATION BLADDER Height & Weight     Self, Time, Situation, Place    Continent Weight: 106 kg Height:  5\' 5"  (165.1 cm)  BEHAVIORAL SYMPTOMS/MOOD NEUROLOGICAL BOWEL NUTRITION STATUS      Continent    AMBULATORY STATUS COMMUNICATION OF NEEDS Skin   Extensive Assist Verbally Surgical wounds                       Personal Care Assistance Level of Assistance              Functional Limitations Info             SPECIAL CARE FACTORS FREQUENCY  PT (By licensed PT)     PT Frequency: 5 days a week  Contractures Contractures Info: Not present    Additional Factors Info  Code Status, Allergies Code Status Info: full code Allergies Info: Penicillin           Current Medications (09/24/2019):  This is the current hospital active medication list Current Facility-Administered Medications  Medication Dose Route Frequency Provider Last Rate  Last Admin  . 0.9 %  sodium chloride infusion   Intravenous Continuous Poggi, Marshall Cork, MD   Stopped at 09/23/19 [phone]  . acetaminophen (TYLENOL) tablet 325-650 mg  325-650 mg Oral Q6H PRN Poggi, Marshall Cork, MD      . allopurinol (ZYLOPRIM) tablet 300 mg  300 mg Oral Daily Poggi, Marshall Cork, MD   300 mg at 09/24/19 0919  . bisacodyl (DULCOLAX) suppository 10 mg  10 mg Rectal Daily PRN Poggi, Marshall Cork, MD      . busPIRone (BUSPAR) tablet 5 mg  5 mg Oral QID PRN Poggi, Marshall Cork, MD      . chlorthalidone (HYGROTON) tablet 12.5 mg  12.5 mg Oral Daily Poggi, Marshall Cork, MD   12.5 mg at 09/23/19 0854  . diphenhydrAMINE (BENADRYL) 12.5 MG/5ML elixir 12.5-25 mg  12.5-25 mg Oral Q4H PRN Poggi, Marshall Cork, MD      . docusate sodium (COLACE) capsule 100 mg  100 mg Oral BID Poggi, Marshall Cork, MD   100 mg at 09/24/19 0913  . enoxaparin (LOVENOX) injection 40 mg  40 mg Subcutaneous Q24H Poggi, Marshall Cork, MD   40 mg at 09/24/19 0914  . fluticasone (FLONASE) 50 MCG/ACT nasal spray 2 spray  2 spray Each Nare Daily Poggi, Marshall Cork, MD   2 spray at 09/24/19 [phone]  . folic acid (FOLVITE) tablet 0.5 mg  500 mcg Oral Daily Poggi, Marshall Cork, MD   0.5 mg at 09/24/19 0913  . gabapentin (NEURONTIN) capsule 300 mg  300 mg Oral QID Corky Mull, MD   300 mg at 09/24/19 0913  . HYDROmorphone (DILAUDID) injection 0.5-1 mg  0.5-1 mg Intravenous Q2H PRN Poggi, Marshall Cork, MD   1 mg at 09/23/19 0701  . losartan (COZAAR) tablet 100 mg  100 mg Oral Daily Poggi, Marshall Cork, MD   100 mg at 09/24/19 0913  . lubiprostone (AMITIZA) capsule 24 mcg  24 mcg Oral BID PRN Poggi, Marshall Cork, MD      . magnesium hydroxide (MILK OF MAGNESIA) suspension 30 mL  30 mL Oral Daily PRN Poggi, Marshall Cork, MD      . metoCLOPramide (REGLAN) tablet 5-10 mg  5-10 mg Oral Q8H PRN Poggi, Marshall Cork, MD       Or  . metoCLOPramide (REGLAN) injection 5-10 mg  5-10 mg Intravenous Q8H PRN Poggi, Marshall Cork, MD      . ondansetron (ZOFRAN) tablet 4 mg  4 mg Oral Q6H PRN Poggi, Marshall Cork, MD   4 mg at 09/23/19 1846   Or  .  ondansetron (ZOFRAN) injection 4 mg  4 mg Intravenous Q6H PRN Poggi, Marshall Cork, MD   4 mg at 09/23/19 0301  . oxyCODONE (Oxy IR/ROXICODONE) immediate release tablet 5-10 mg  5-10 mg Oral Q4H PRN Poggi, Marshall Cork, MD   5 mg at 09/24/19 0913  . pantoprazole (PROTONIX) EC tablet 40 mg  40 mg Oral Daily Poggi, Marshall Cork, MD   40 mg at 09/24/19 0913  . propranolol ER (INDERAL LA) 24 hr capsule 80 mg  80 mg Oral Daily Poggi, Marshall Cork, MD   80 mg at 09/24/19  [id number]  . sertraline (ZOLOFT) tablet 100 mg  100 mg Oral Daily Poggi, Marshall Cork, MD   100 mg at 09/24/19 0913  . sodium phosphate (FLEET) 7-19 GM/118ML enema 1 enema  1 enema Rectal Once PRN Poggi, Marshall Cork, MD      . tiZANidine (ZANAFLEX) tablet 4 mg  4 mg Oral BID PRN Poggi, Marshall Cork, MD      . traMADol Veatrice Bourbon) tablet 50 mg  50 mg Oral Q6H PRN Poggi, Marshall Cork, MD   50 mg at 09/23/19 1516  . vitamin B-12 (CYANOCOBALAMIN) tablet 5,000 mcg  5,000 mcg Oral Daily Poggi, Marshall Cork, MD   5,000 mcg at 09/24/19 [id number]     Discharge Medications: Please see discharge summary for a list of discharge medications.  Relevant Imaging Results:  Relevant Lab Results:   Additional Information [id number]  Su Hilt, RN

## 2019-09-24 NOTE — Discharge Summary (Addendum)
Physician Discharge Summary  Patient ID: Sabrina Green MRN: [id number] DOB/AGE: 11-16-1945 74 y.o.  Admit date: 09/21/2019 Discharge date: 09/24/2019  Admission Diagnoses:  Status post total knee replacement using cement, left [Z96.652]  Discharge Diagnoses: Patient Active Problem List   Diagnosis Date Noted  . 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 Primary osteoarthritis involving multiple joints 08/16/2019  . Bursitis of left shoulder 02/22/2019  . [place] arthritis 02/01/2019  . Carpal tunnel syndrome on right 10/14/2018  . Carpal tunnel syndrome, left 10/14/2018  . Neurogenic pain 03/02/2018  . Tricompartmental disease of knee (Bilateral) 02/04/2018  . Chronic low back pain (Secondary Area of Pain) (Bilateral) (L>R) 02/04/2018  . Lumbar facet syndrome (Bilateral) (L>R) 02/04/2018  . Spondylosis without myelopathy or radiculopathy, lumbosacral region 02/04/2018  . History of claustrophobia 01/05/2018  . History of panic attacks 01/05/2018  . Chronic shoulder pain (Bilateral) 01/05/2018  . Osteoarthritis of shoulder (Bilateral) 01/05/2018  . Osteoarthritis of knees (Bilateral) 01/05/2018  . Elevated C-reactive protein (CRP) 12/15/2017  . Chronic pain syndrome 12/11/2017  . Pharmacologic therapy 12/11/2017  . Disorder of skeletal system 12/11/2017  . Problems influencing health status 12/11/2017  . Chronic knee pain (Primary Area of Pain) (Bilateral) (L>R) 12/11/2017  . Chronic lower extremity pain [facility] Area of Pain) (Left) 12/11/2017  . Chronic shoulder pain (Fourth Area of Pain) (Right) 12/11/2017  . Chronic hip pain (Left) 12/11/2017  . Fatty liver disease, nonalcoholic 123456  . DDD (degenerative disc disease), lumbar 06/02/2017  . Chronic low back pain (Midline) 04/22/2017  . CKD (chronic kidney disease) stage 3, GFR 30-59 ml/min 03/28/2017  . BMI 40.0-44.9, adult ([place]) 03/24/2017  . Elevated erythrocyte sedimentation rate  02/12/2017  . Osteoarthritis of knee (Left) 12/26/2016  . Shoulder bursitis (Right) 06/06/2016  . Encounter for long-term (current) use of high-risk medication 03/06/2016  . Long term current use of opiate analgesic 03/06/2016  . Chronic gout 02/26/2016  . Essential hypertension 02/26/2016  . Exercise-induced asthma 02/26/2016  . Fibromyalgia 02/26/2016  . GERD without esophagitis 02/26/2016  . IBS (irritable bowel syndrome) 02/26/2016  . Rheumatoid arthritis involving multiple sites ([place]) 02/26/2016  . Vitamin B12 deficiency 02/26/2016  . Vitamin D deficiency 02/26/2016    Past Medical History:  Diagnosis Date  . Anemia    vitamin b and d deficiency  . Anxiety   . Arthritis    osteoarthrits  . Chronic pain syndrome   . CKD (chronic kidney disease) stage 3, GFR 30-59 ml/min   . Complication of anesthesia    difficulty waking up  . DDD (degenerative disc disease), cervical   . Fibromyalgia    not accurate, per patient  . GERD (gastroesophageal reflux disease)   . Gout   . Hearing loss   . History of kidney stones 1987  . Hypertension   . Rheumatoid arthritis (HCC)    orencia infusions  . Vitamin B12 deficiency   . Vitamin D deficiency      Transfusion: None.   Consultants (if any):   Discharged Condition: Improved  Hospital Course: Sabrina Green is an 74 y.o. female who was admitted 09/21/2019 with a diagnosis of left knee osteoarthritis and went to the operating room on 09/21/2019 and underwent the above named procedures.    Surgeries: Procedure(s): TOTAL KNEE ARTHROPLASTY on 09/21/2019 Patient tolerated the surgery well. Taken to PACU where she was stabilized and then transferred to the orthopedic floor.  Started on Lovenox 40mg  q 24 hrs. Foot pumps  applied bilaterally at 80 mm. Heels elevated on bed with rolled towels. No evidence of DVT. Negative [place]. Physical therapy started on day #1 for gait training and transfer. OT started day #1 for ADL and assisted  devices.  Patient's IV was removed on POD3.  Foley removed on POD1.  Implants:   Left TKA using a Signature-guided all-cemented Bi[employer] Vanguard system with a 70 mm PCR femur, a 75 mm tibial tray with a 12 mm anterior stabilized E-poly insert, and a 34 x 8.5 mm all-poly 3-pegged domed patella.  She was given perioperative antibiotics:  Anti-infectives (From admission, onward)   Start     Dose/Rate Route Frequency Ordered Stop   09/21/19 1700  clindamycin (CLEOCIN) IVPB 900 mg     900 mg 100 mL/hr over 30 Minutes Intravenous Every 6 hours 09/21/19 1544 09/22/19 0619   09/21/19 1021  clindamycin (CLEOCIN) 900 MG/50ML IVPB    Note to Pharmacy: Norton Blizzard  : cabinet override      09/21/19 1021 09/21/19 1103   09/21/19 0600  clindamycin (CLEOCIN) IVPB 900 mg     900 mg 100 mL/hr over 30 Minutes Intravenous On call to O.R. 09/20/19 2327 09/21/19 1118    .  She was given sequential compression devices, early ambulation, and Lovenox for DVT prophylaxis.  She benefited maximally from the hospital stay and there were no complications.    Recent vital signs:  Vitals:   09/24/19 0003 09/24/19 0733  BP: (!) 140/57 (!) 139/46  Pulse: 86 76  Resp: 17 17  Temp: 98.1 F (36.7 C) 99 F (37.2 C)  SpO2: 95% 96%    Recent laboratory studies:  Lab Results  Component Value Date   HGB 10.6 (L) 09/24/2019   HGB 10.8 (L) 09/23/2019   HGB 11.3 (L) 09/22/2019   Lab Results  Component Value Date   WBC 8.4 09/24/2019   PLT 144 (L) 09/24/2019   Lab Results  Component Value Date   INR 1.1 09/17/2019   Lab Results  Component Value Date   NA 139 09/24/2019   K 4.5 09/24/2019   CL 107 09/24/2019   CO2 24 09/24/2019   BUN 38 (H) 09/24/2019   CREATININE 1.66 (H) 09/24/2019   GLUCOSE 100 (H) 09/24/2019    Discharge Medications:   Allergies as of 09/24/2019      Reactions   Penicillins Hives      Medication List    STOP taking these medications   HYDROcodone-acetaminophen  5-325 MG tablet Commonly known as: NORCO/VICODIN     TAKE these medications   Abatacept 125 MG/ML Sosy Inject 125 mg into the skin every 30 (thirty) days. Next dose March 15th   allopurinol 300 MG tablet Commonly known as: ZYLOPRIM Take 300 mg by mouth daily. In the morning   busPIRone 5 MG tablet Commonly known as: BUSPAR Take 5 mg by mouth 4 (four) times daily as needed (anxiety).   chlorthalidone 25 MG tablet Commonly known as: HYGROTON Take 12.5 mg by mouth daily.   diclofenac Sodium 1 % Gel Commonly known as: VOLTAREN Apply 2 g topically 4 (four) times daily as needed (pain.).   enoxaparin 40 MG/0.4ML injection Commonly known as: LOVENOX Inject 0.4 mLs (40 mg total) into the skin daily.   fluticasone 50 MCG/ACT nasal spray Commonly known as: FLONASE Place 2 sprays into both nostrils daily.   folic acid A999333 MCG tablet Commonly known as: FOLVITE Take 400 mcg by mouth daily.   gabapentin 300 MG  capsule Commonly known as: NEURONTIN Take 1-3 capsules (300-900 mg total) by mouth 4 (four) times daily. Follow written titration schedule. What changed: how much to take   losartan 100 MG tablet Commonly known as: COZAAR Take 100 mg by mouth daily.   lubiprostone 24 MCG capsule Commonly known as: AMITIZA Take 24 mcg by mouth 2 (two) times daily as needed for constipation.   Magnesium 400 MG Tabs Take by mouth. NOT TAKING   methotrexate 50 MG/2ML injection Inject 12.5 mg into the vein every Thursday. (0.5 ml)   mupirocin ointment 2 % Commonly known as: Bactroban Apply 1 application topically 3 (three) times daily. What changed:   when to take this  reasons to take this   omeprazole 20 MG capsule Commonly known as: PRILOSEC Take 20 mg by mouth daily before breakfast.   oxyCODONE 5 MG immediate release tablet Commonly known as: Oxy IR/ROXICODONE Take 1-2 tablets (5-10 mg total) by mouth every 4 (four) hours as needed for moderate pain (pain score 4-6).    propranolol ER 80 MG 24 hr capsule Commonly known as: INDERAL LA Take 80 mg by mouth daily.   sertraline 100 MG tablet Commonly known as: ZOLOFT Take 100 mg by mouth daily.   tiZANidine 4 MG capsule Commonly known as: ZANAFLEX Take 4 mg by mouth 2 (two) times daily as needed for muscle spasms.   traMADol 50 MG tablet Commonly known as: ULTRAM Take 1 tablet (50 mg total) by mouth every 6 (six) hours as needed for moderate pain.   Vitamin B-12 5000 MCG Tbdp Take 5,000 mcg by mouth daily.            Durable Medical Equipment  (From admission, onward)         Start     Ordered   09/21/19 1544  DME Bedside commode  Once    Question:  Patient needs a bedside commode to treat with the following condition  Answer:  Status post total knee replacement using cement, left   09/21/19 1544   09/21/19 1544  DME 3 n 1  Once     09/21/19 1544   09/21/19 1544  DME Walker rolling  Once    Question Answer Comment  Walker: With 5 Inch Wheels   Patient needs a walker to treat with the following condition Status post total knee replacement using cement, left      09/21/19 1544         Diagnostic Studies: DG Knee Left Port  Result Date: 09/21/2019 CLINICAL DATA:  74 year old female with left knee replacement EXAM: PORTABLE LEFT KNEE - 1-2 VIEW COMPARISON:  Left knee CT dated 07/08/2019. FINDINGS: There is a total left knee arthroplasty. The arthroplasty components appear intact and in anatomic alignment. No evidence of loosening. The bones are osteopenic. There is no acute fracture or dislocation. Small amount of joint effusion and air, postsurgical. Cutaneous staples over the anterior knee. IMPRESSION: Status post total left knee arthroplasty. No immediate complication. Electronically Signed   By: Anner Crete M.D.   On: 09/21/2019 15:23   Disposition: Discharge disposition: 03-Skilled [place] for discharge to rehab this afternoon.   Contact information for  follow-up providers    Lattie Corns, PA-C On 10/05/2019.   Specialty: Physician Assistant Why: [email or web address] Contact information: [place] [address] [phone]            Contact information for after-discharge care  Destination    [facility] Preferred SNF .   Service: Skilled Nursing Contact information: [address] [place] [phone]                 Signed: Judson Roch PA-C 09/24/2019, 11:21 AM

## 2019-09-24 NOTE — Progress Notes (Signed)
Report given to Kim at [employer]. Discharge education placed in packet for EMS

## 2019-09-24 NOTE — TOC Progression Note (Signed)
Transition of Care [facility]) - Progression Note    Patient Details  Name: Sabrina Green MRN: [id number] Date of Birth: 1946-05-05  Transition of Care [facility]) CM/SW Contact  Su Hilt, RN Phone Number: 09/24/2019, 9:57 AM  Clinical Narrative:   Spoke with the patient about going to SNF instead of home, she is agreeable and I did a bed search and PASSR, FL@ completed, will review beds once avilable    Expected Discharge Plan: [place] Barriers to Discharge: Barriers Resolved  Expected Discharge Plan and Services Expected Discharge Plan: [place]   Discharge Planning Services: CM Consult   Living arrangements for the past 2 months: Single Family Home Expected Discharge Date: 09/24/19               DME Arranged: Gilford Rile rolling DME Agency: [employer] Date DME Agency Contacted: 09/22/19 Time DME Agency Contacted: [phone] Representative spoke with at DME Agency: Leroy Sea [place]: PT [place]: [facility] [employer] Date [place]: 09/22/19 Time [place]: [phone] Representative spoke with at [place]: [place] ([place]) Interventions    Readmission Risk Interventions No flowsheet data found.

## 2019-09-24 NOTE — TOC Transition Note (Signed)
Transition of Care [facility]) - CM/SW Discharge Note   Patient Details  Name: Sabrina Green MRN: [id number] Date of Birth: January 07, 1946  [facility]) CM/SW Contact:  Su Hilt, RN Phone Number: 09/24/2019, 12:34 PM   Clinical Narrative:    [facility] to [facility] to [facility] room 803, Packet on the chart EMS called and nurse aware, bedside nurse to call report, the patients daughet danielle aware   Final next level of care: Skilled Nursing Facility Barriers to Discharge: Barriers Resolved   Patient Goals and CMS Choice Patient states their goals for this hospitalization and ongoing recovery are:: go home with her daughter      Discharge Placement                       Discharge Plan and Services   Discharge Planning Services: CM Consult            DME Arranged: Gilford Rile rolling DME Agency: [employer] Date DME Agency Contacted: 09/22/19 Time DME Agency Contacted: [phone] Representative spoke with at DME Agency: [place]: PT [place]: [facility] [employer] Date [place]: 09/22/19 Time [place]: [phone] Representative spoke with at [place]: [place] ([place]) Interventions     Readmission Risk Interventions No flowsheet data found.

## 2019-09-24 NOTE — TOC Progression Note (Signed)
Transition of Care [facility]) - Progression Note    Patient Details  Name: Sabrina Green MRN: [id number] Date of Birth: 09/14/1945  Transition of [facility]) CM/SW Contact  Su Hilt, RN Phone Number: 09/24/2019, 1:47 PM  Clinical Narrative:   [facility] to [facility]    Expected Discharge Plan: [place] Barriers to Discharge: Barriers Resolved  Expected Discharge Plan and Services Expected Discharge Plan: [place]   Discharge Planning Services: CM Consult   Living arrangements for the past 2 months: Single Family Home Expected Discharge Date: 09/24/19               DME Arranged: Gilford Rile rolling DME Agency: [employer] Date DME Agency Contacted: 09/22/19 Time DME Agency Contacted: [phone] Representative spoke with at DME Agency: Leroy Sea [place]: PT [place]: [facility] [employer] Date [place]: 09/22/19 Time [place]: [phone] Representative spoke with at [place]: [place] ([place]) Interventions    Readmission Risk Interventions No flowsheet data found.

## 2019-09-24 NOTE — Discharge Instructions (Signed)

## 2019-09-24 NOTE — TOC Progression Note (Signed)
Transition of Care [facility]) - Progression Note    Patient Details  Name: Sabrina Green MRN: [id number] Date of Birth: August 20, 1945  Transition of Care [facility]) CM/SW Contact  Su Hilt, RN Phone Number: 09/24/2019, 10:35 AM  Clinical Narrative:   Spoke with the patient and reviewed the bed offers.  [facility], I called her daughter Andee Poles and notified her of the DC to [facility], she is in agreeance and called her other family members to let them know. I explained to her that the patient will not be allowed to have visitors and will be quarentined for 14 days, She stated understanding, I notified the physician of the bed and called the [facility] to get a rapid covid so that she can DC today   Expected Discharge Plan: [place] Barriers to Discharge: Barriers Resolved  Expected Discharge Plan and Services Expected Discharge Plan: [place]   Discharge Planning Services: CM Consult   Living arrangements for the past 2 months: Single Family Home Expected Discharge Date: 09/24/19               DME Arranged: Gilford Rile rolling DME Agency: [employer] Date DME Agency Contacted: 09/22/19 Time DME Agency Contacted: [phone] Representative spoke with at DME Agency: Leroy Sea HH Arranged: PT [place]: [facility] [employer] Date [place]: 09/22/19 Time [place]: [phone] Representative spoke with at [place]: [place] ([place]) Interventions    Readmission Risk Interventions No flowsheet data found.

## 2019-09-24 NOTE — Progress Notes (Signed)
Physical Therapy Treatment Patient Details Name: Sabrina Green MRN: [id number] DOB: 06/27/1946 Today's Date: 09/24/2019    History of Present Illness Pt is 74 y/o F admitted for L TKR.    PT Comments    Pt was long sitting in bed upon arriving. She agrees to PT session and reports 3/10 pain at rest that elevated to 6/10 throughout session. Pt required increased assistance with all mobility, transfers, and gait. Max assist to exit L side of bed with HOB elevated and heavy use of bed rails. Pt requesting to to use BR and required max assist to stand step to [facility] with pt not able to make it  in time. She urinated on floor. After cleaning up, pt was able to ambulate 30 ft with CGA + gait belt + RW with very slow antalgic gait pattern and poor posture. Pt was repositioned up in recliner with RN in room. Lengthy discussion with pt and pt's daughter about safety concerns with pt D/C home. D/C orders in but pt does not feel safe with this disposition. Both daughter/patient agreeable to rehab. Therapist messaged MD and discussed with CM. CM to search for available beds this date. PT recommending SNF at D/C for safety to address deficits with mobility, transfers, strength, ROM and gait. Pt would benefit from rehab to address these deficits in assisting pt returning to PLOF.       Follow Up Recommendations  SNF     Equipment Recommendations  Other (comment)(pt has personal RW and BSC at home)    Recommendations for Other Services       Precautions / Restrictions Precautions Precautions: Knee;Fall Precaution Booklet Issued: Yes (comment) Restrictions Weight Bearing Restrictions: Yes LLE Weight Bearing: Weight bearing as tolerated    Mobility  Bed Mobility Overal bed mobility: Needs Assistance Bed Mobility: Supine to Sit     Supine to sit: HOB elevated;Mod assist;Max assist     General bed mobility comments: Pt required increased assistance and time to perform bed mobility. Heavy use of  bedrail. Unable to perform without max assist  Transfers Overall transfer level: Needs assistance Equipment used: Rolling walker (2 wheeled) Transfers: Sit to/from Stand Sit to Stand: Mod assist;Max assist;From elevated surface         General transfer comment: Pt requested to use BR once seated EOB. She required mod-max to stand from elevated bed height and max Vcs for technique. mid transfer to [facility] pt starts urinating on floor. She required mod assist to stand from elevated BSC.  Ambulation/Gait Ambulation/Gait assistance: Min guard Gait Distance (Feet): 30 Feet Assistive device: Rolling walker (2 wheeled) Gait Pattern/deviations: Step-to pattern;Antalgic;Trunk flexed Gait velocity: decreased   General Gait Details: Pt was able to ambulate with CGA 30 ft but c/o sever UE pain along with knee pain.   Stairs             Wheelchair Mobility    Modified Rankin (Stroke Patients Only)       Balance                                            Cognition Arousal/Alertness: Awake/alert Behavior During Therapy: WFL for tasks assessed/performed Overall Cognitive Status: Within Functional Limits for tasks assessed  General Comments: Pt is Alert but slightly disoriented. Unaware of which hospital she is in. She is orineted to self, injury, day but not time.      Exercises      General Comments        Pertinent Vitals/Pain Pain Assessment: 0-10 Pain Score: 6  Pain Location: L knee/ BUE(shoulders with wt bearing on RW) Pain Intervention(s): Limited activity within patient's tolerance;Monitored during session;Ice applied    Home Living                      Prior Function            PT Goals (current goals can now be found in the care plan section) Acute Rehab PT Goals Patient Stated Goal: To be able to move on my own Progress towards PT goals: Not progressing toward goals - comment(pain  and safety limiting progression)    Frequency    BID      PT Plan Discharge plan needs to be updated    Co-evaluation              AM-PAC PT "6 Clicks" Mobility   Outcome Measure  Help needed turning from your back to your side while in a flat bed without using bedrails?: A Lot Help needed moving from lying on your back to sitting on the side of a flat bed without using bedrails?: A Lot Help needed moving to and from a bed to a chair (including a wheelchair)?: A Lot Help needed standing up from a chair using your arms (e.g., wheelchair or bedside chair)?: A Lot Help needed to walk in hospital room?: A Little Help needed climbing 3-5 steps with a railing? : A Lot 6 Click Score: 13    End of Session Equipment Utilized During Treatment: Gait belt Activity Tolerance: Patient limited by pain Patient left: in chair;with call bell/phone within reach;with chair alarm set Nurse Communication: Mobility status PT Visit Diagnosis: Muscle weakness (generalized) (M62.81);Difficulty in walking, not elsewhere classified (R26.2);Pain Pain - Right/Left: Left Pain - part of body: Knee     Time: [id number] PT Time Calculation (min) (ACUTE ONLY): 39 min  Charges:  $Gait Training: 8-22 mins $Therapeutic Activity: 23-37 mins                     Julaine Fusi PTA 09/24/19, 9:53 AM

## 2019-09-24 NOTE — Progress Notes (Signed)
  Subjective: 3 Days Post-Op Procedure(s) (LRB): TOTAL KNEE ARTHROPLASTY (Left) Patient reports pain as moderate in the left knee Patient is well but is having increased left knee pain, numbness in the left foot has resolved. Plan is to go Home after hospital stay. Negative for chest pain and shortness of breath Fever: no Gastrointestinal:Negative for nausea and vomiting  Objective: Vital signs in last 24 hours: Temp:  [98.1 F (36.7 C)-99.3 F (37.4 C)] 99 F (37.2 C) (02/26 0733) Pulse Rate:  [73-86] 76 (02/26 0733) Resp:  [17] 17 (02/26 0733) BP: (117-153)/(46-58) 139/46 (02/26 0733) SpO2:  [95 %-96 %] 96 % (02/26 0733) Weight:  [106 kg] 106 kg (02/25 2036)  Intake/Output from previous day: No intake or output data in the 24 hours ending 09/24/19 0748  Intake/Output this shift: No intake/output data recorded.  Labs: Recent Labs    09/22/19 0324 09/23/19 0337 09/24/19 0608  HGB 11.3* 10.8* 10.6*   Recent Labs    09/23/19 0337 09/24/19 0608  WBC 7.9 8.4  RBC 3.81* 3.73*  HCT 34.7* 34.3*  PLT 135* 144*   Recent Labs    09/23/19 0337 09/24/19 0608  NA 141 139  K 4.4 4.5  CL 107 107  CO2 26 24  BUN 45* 38*  CREATININE 1.88* 1.66*  GLUCOSE 91 100*  CALCIUM 8.2* 8.6*   No results for input(s): LABPT, INR in the last 72 hours.  EXAM General - Patient is Alert, Appropriate and Oriented Extremity - ABD soft Neurovascular intact Sensation intact distally Intact pulses distally Dorsiflexion/Plantar flexion intact Incision: scant drainage No cellulitis present  Negative Homan's to the left leg.  Calf is soft to palpation. Dressing/Incision - blood tinged drainage Patient is able to flex and extend foot this morning without pain.  Past Medical History:  Diagnosis Date  . Anemia    vitamin b and d deficiency  . Anxiety   . Arthritis    osteoarthrits  . Chronic pain syndrome   . CKD (chronic kidney disease) stage 3, GFR 30-59 ml/min   . Complication  of anesthesia    difficulty waking up  . DDD (degenerative disc disease), cervical   . Fibromyalgia    not accurate, per patient  . GERD (gastroesophageal reflux disease)   . Gout   . Hearing loss   . History of kidney stones 1987  . Hypertension   . Rheumatoid arthritis (HCC)    orencia infusions  . Vitamin B12 deficiency   . Vitamin D deficiency     Assessment/Plan: 3 Days Post-Op Procedure(s) (LRB): TOTAL KNEE ARTHROPLASTY (Left) Active Problems:   Status post total knee replacement using cement, left  Estimated body mass index is 38.89 kg/m as calculated from the following:   Height as of this encounter: 5\' 5"  (1.651 m).   Weight as of this encounter: 106 kg. Advance diet Up with therapy   Labs reviewed this AM. AKI, Cr 1.66 and BUN 38.  Patient is eating well, kidney function returning back to baseline. Acute foot drop has resolved to the left foot. Patient has had a BM. Continue with PT today. Plan for d/c home this afternoon.  DVT Prophylaxis - Lovenox and Foot Pumps Weight-Bearing as tolerated to left leg  J. Cameron Proud, PA-C [facility] 09/24/2019, 7:48 AM

## 2019-09-24 NOTE — Care Management Important Message (Signed)
Important Message  Patient Details  Name: Sabrina Green MRN: [id number] Date of Birth: Jun 04, 1946   Medicare Important Message Given:  N/A - LOS <3 / Initial given by admissions     Juliann Pulse A Hinton Luellen 09/24/2019, 8:03 AM

## 2019-09-24 NOTE — TOC Transition Note (Signed)
Transition of Care [facility]) - CM/SW Discharge Note   Patient Details  Name: Willett Budzynski MRN: [id number] Date of Birth: April 25, 1946  Transition of Care [facility]) CM/SW Contact:  Su Hilt, RN Phone Number: 09/24/2019, 8:18 AM   Clinical Narrative:   Patient to discharge home today with [facility] services, RW in the room to take home NO additional needs    Final next level of care: [place] Barriers to Discharge: Barriers Resolved   Patient Goals and CMS Choice Patient states their goals for this hospitalization and ongoing recovery are:: go home with her daughter      Discharge Placement                       Discharge Plan and Services   Discharge Planning Services: CM Consult            DME Arranged: Gilford Rile rolling DME Agency: [employer] Date DME Agency Contacted: 09/22/19 Time DME Agency Contacted: [phone] Representative spoke with at DME Agency: [place]: PT [place]: [facility] (formerly [employer]) Date [place]: 09/22/19 Time [place]: [phone] Representative spoke with at [place]: [place] ([place]) Interventions     Readmission Risk Interventions No flowsheet data found.

## 2019-12-28 ENCOUNTER — Ambulatory Visit: Payer: Medicare Other | Admitting: Pain Medicine

## 2019-12-28 NOTE — Progress Notes (Deleted)
No show to appointment.

## 2020-02-09 ENCOUNTER — Other Ambulatory Visit: Payer: Self-pay | Admitting: Family Medicine

## 2020-02-09 DIAGNOSIS — Z1231 Encounter for screening mammogram for malignant neoplasm of breast: Secondary | ICD-10-CM

## 2020-03-08 ENCOUNTER — Inpatient Hospital Stay
Admission: EM | Admit: 2020-03-08 | Discharge: 2020-03-13 | DRG: 163 | Disposition: A | Discharge status: Home / Self Care | Payer: Medicare Other | Attending: Internal Medicine | Admitting: Internal Medicine

## 2020-03-08 ENCOUNTER — Encounter: Payer: Self-pay | Admitting: Emergency Medicine

## 2020-03-08 ENCOUNTER — Emergency Department: Payer: Medicare Other

## 2020-03-08 DIAGNOSIS — I2699 Other pulmonary embolism without acute cor pulmonale: Secondary | ICD-10-CM | POA: Diagnosis not present

## 2020-03-08 DIAGNOSIS — M069 Rheumatoid arthritis, unspecified: Secondary | ICD-10-CM | POA: Diagnosis not present

## 2020-03-08 DIAGNOSIS — N170 Acute kidney failure with tubular necrosis: Secondary | ICD-10-CM | POA: Diagnosis not present

## 2020-03-08 DIAGNOSIS — E785 Hyperlipidemia, unspecified: Secondary | ICD-10-CM | POA: Diagnosis present

## 2020-03-08 DIAGNOSIS — I2609 Other pulmonary embolism with acute cor pulmonale: Principal | ICD-10-CM | POA: Diagnosis present

## 2020-03-08 DIAGNOSIS — D696 Thrombocytopenia, unspecified: Secondary | ICD-10-CM | POA: Diagnosis present

## 2020-03-08 DIAGNOSIS — Z87442 Personal history of urinary calculi: Secondary | ICD-10-CM

## 2020-03-08 DIAGNOSIS — I959 Hypotension, unspecified: Secondary | ICD-10-CM | POA: Diagnosis present

## 2020-03-08 DIAGNOSIS — D638 Anemia in other chronic diseases classified elsewhere: Secondary | ICD-10-CM | POA: Diagnosis present

## 2020-03-08 DIAGNOSIS — F419 Anxiety disorder, unspecified: Secondary | ICD-10-CM | POA: Diagnosis present

## 2020-03-08 DIAGNOSIS — J9601 Acute respiratory failure with hypoxia: Secondary | ICD-10-CM | POA: Diagnosis present

## 2020-03-08 DIAGNOSIS — I361 Nonrheumatic tricuspid (valve) insufficiency: Secondary | ICD-10-CM | POA: Diagnosis not present

## 2020-03-08 DIAGNOSIS — R9431 Abnormal electrocardiogram [ECG] [EKG]: Secondary | ICD-10-CM | POA: Diagnosis present

## 2020-03-08 DIAGNOSIS — G894 Chronic pain syndrome: Secondary | ICD-10-CM | POA: Diagnosis present

## 2020-03-08 DIAGNOSIS — I82431 Acute embolism and thrombosis of right popliteal vein: Secondary | ICD-10-CM | POA: Diagnosis present

## 2020-03-08 DIAGNOSIS — R7989 Other specified abnormal findings of blood chemistry: Secondary | ICD-10-CM | POA: Diagnosis present

## 2020-03-08 DIAGNOSIS — H9190 Unspecified hearing loss, unspecified ear: Secondary | ICD-10-CM | POA: Diagnosis present

## 2020-03-08 DIAGNOSIS — R0603 Acute respiratory distress: Secondary | ICD-10-CM | POA: Diagnosis not present

## 2020-03-08 DIAGNOSIS — Z20822 Contact with and (suspected) exposure to covid-19: Secondary | ICD-10-CM | POA: Diagnosis present

## 2020-03-08 DIAGNOSIS — N1832 Chronic kidney disease, stage 3b: Secondary | ICD-10-CM

## 2020-03-08 DIAGNOSIS — Z6839 Body mass index (BMI) 39.0-39.9, adult: Secondary | ICD-10-CM | POA: Diagnosis not present

## 2020-03-08 DIAGNOSIS — J45909 Unspecified asthma, uncomplicated: Secondary | ICD-10-CM | POA: Diagnosis present

## 2020-03-08 DIAGNOSIS — I214 Non-ST elevation (NSTEMI) myocardial infarction: Secondary | ICD-10-CM | POA: Diagnosis present

## 2020-03-08 DIAGNOSIS — I2602 Saddle embolus of pulmonary artery with acute cor pulmonale: Secondary | ICD-10-CM | POA: Diagnosis not present

## 2020-03-08 DIAGNOSIS — R000 Tachycardia, unspecified: Secondary | ICD-10-CM | POA: Diagnosis present

## 2020-03-08 DIAGNOSIS — Z8249 Family history of ischemic heart disease and other diseases of the circulatory system: Secondary | ICD-10-CM

## 2020-03-08 DIAGNOSIS — K589 Irritable bowel syndrome without diarrhea: Secondary | ICD-10-CM | POA: Diagnosis present

## 2020-03-08 DIAGNOSIS — Z9049 Acquired absence of other specified parts of digestive tract: Secondary | ICD-10-CM

## 2020-03-08 DIAGNOSIS — I129 Hypertensive chronic kidney disease with stage 1 through stage 4 chronic kidney disease, or unspecified chronic kidney disease: Secondary | ICD-10-CM | POA: Diagnosis present

## 2020-03-08 DIAGNOSIS — I272 Pulmonary hypertension, unspecified: Secondary | ICD-10-CM | POA: Diagnosis present

## 2020-03-08 DIAGNOSIS — R0902 Hypoxemia: Secondary | ICD-10-CM | POA: Diagnosis not present

## 2020-03-08 DIAGNOSIS — R42 Dizziness and giddiness: Secondary | ICD-10-CM | POA: Diagnosis not present

## 2020-03-08 DIAGNOSIS — Z90710 Acquired absence of both cervix and uterus: Secondary | ICD-10-CM

## 2020-03-08 DIAGNOSIS — K219 Gastro-esophageal reflux disease without esophagitis: Secondary | ICD-10-CM | POA: Diagnosis present

## 2020-03-08 DIAGNOSIS — R0789 Other chest pain: Secondary | ICD-10-CM | POA: Diagnosis not present

## 2020-03-08 DIAGNOSIS — I82409 Acute embolism and thrombosis of unspecified deep veins of unspecified lower extremity: Secondary | ICD-10-CM | POA: Diagnosis present

## 2020-03-08 DIAGNOSIS — I248 Other forms of acute ischemic heart disease: Secondary | ICD-10-CM | POA: Diagnosis not present

## 2020-03-08 DIAGNOSIS — M109 Gout, unspecified: Secondary | ICD-10-CM | POA: Diagnosis present

## 2020-03-08 DIAGNOSIS — Z880 Allergy status to penicillin: Secondary | ICD-10-CM

## 2020-03-08 DIAGNOSIS — R778 Other specified abnormalities of plasma proteins: Secondary | ICD-10-CM | POA: Diagnosis not present

## 2020-03-08 DIAGNOSIS — M797 Fibromyalgia: Secondary | ICD-10-CM | POA: Diagnosis present

## 2020-03-08 DIAGNOSIS — E559 Vitamin D deficiency, unspecified: Secondary | ICD-10-CM | POA: Diagnosis present

## 2020-03-08 DIAGNOSIS — I358 Other nonrheumatic aortic valve disorders: Secondary | ICD-10-CM | POA: Diagnosis present

## 2020-03-08 DIAGNOSIS — E538 Deficiency of other specified B group vitamins: Secondary | ICD-10-CM | POA: Diagnosis present

## 2020-03-08 DIAGNOSIS — N183 Chronic kidney disease, stage 3 unspecified: Secondary | ICD-10-CM | POA: Diagnosis present

## 2020-03-08 DIAGNOSIS — Z79899 Other long term (current) drug therapy: Secondary | ICD-10-CM

## 2020-03-08 DIAGNOSIS — I10 Essential (primary) hypertension: Secondary | ICD-10-CM | POA: Diagnosis present

## 2020-03-08 DIAGNOSIS — R0602 Shortness of breath: Secondary | ICD-10-CM

## 2020-03-08 DIAGNOSIS — Z96652 Presence of left artificial knee joint: Secondary | ICD-10-CM | POA: Diagnosis present

## 2020-03-08 LAB — BASIC METABOLIC PANEL
Anion gap: 15 (ref 5–15)
BUN: 38 mg/dL — ABNORMAL HIGH (ref 8–23)
CO2: 21 mmol/L — ABNORMAL LOW (ref 22–32)
Calcium: 9.4 mg/dL (ref 8.9–10.3)
Chloride: 105 mmol/L (ref 98–111)
Creatinine, Ser: 1.77 mg/dL — ABNORMAL HIGH (ref 0.44–1.00)
GFR calc Af Amer: 33 mL/min — ABNORMAL LOW (ref >60)
GFR calc non Af Amer: 28 mL/min — ABNORMAL LOW (ref >60)
Glucose, Bld: 91 mg/dL (ref 70–99)
Potassium: 4 mmol/L (ref 3.5–5.1)
Sodium: 141 mmol/L (ref 135–145)

## 2020-03-08 LAB — TROPONIN I (HIGH SENSITIVITY)
Troponin I (High Sensitivity): 615 ng/L (ref <18)
Troponin I (High Sensitivity): 625 ng/L (ref <18)

## 2020-03-08 LAB — CBC
HCT: 41 % (ref 36.0–46.0)
Hemoglobin: 12.8 g/dL (ref 12.0–15.0)
MCH: 28.4 pg (ref 26.0–34.0)
MCHC: 31.2 g/dL (ref 30.0–36.0)
MCV: 91.1 fL (ref 80.0–100.0)
Platelets: 114 10*3/uL — ABNORMAL LOW (ref 150–400)
RBC: 4.5 MIL/uL (ref 3.87–5.11)
RDW: 14.9 % (ref 11.5–15.5)
WBC: 8 10*3/uL (ref 4.0–10.5)
nRBC: 0 % (ref 0.0–0.2)

## 2020-03-08 LAB — PROTIME-INR

## 2020-03-08 LAB — APTT

## 2020-03-08 LAB — SARS CORONAVIRUS 2 BY RT PCR (HOSPITAL ORDER, PERFORMED IN ~~LOC~~ HOSPITAL LAB): SARS Coronavirus 2: NEGATIVE

## 2020-03-08 MED ORDER — HEPARIN BOLUS VIA INFUSION
4000.0000 [IU] | Freq: Once | INTRAVENOUS | Status: AC
  Administered 2020-03-08: 4000 [IU] via INTRAVENOUS
  Filled 2020-03-08: qty 4000

## 2020-03-08 MED ORDER — HEPARIN (PORCINE) 25000 UT/250ML-% IV SOLN: 10.0000 [IU]/kg/h | INTRAVENOUS | Status: DC

## 2020-03-08 MED ORDER — HEPARIN SODIUM (PORCINE) 5000 UNIT/ML IJ SOLN: 60.0000 [IU]/kg | Freq: Once | INTRAMUSCULAR | Status: DC

## 2020-03-08 MED ORDER — ASPIRIN 81 MG PO CHEW
324.0000 mg | CHEWABLE_TABLET | Freq: Once | ORAL | Status: AC
  Administered 2020-03-08: 324 mg via ORAL
  Filled 2020-03-08: qty 4

## 2020-03-08 MED ORDER — HEPARIN (PORCINE) 25000 UT/250ML-% IV SOLN
1000.0000 [IU]/h | INTRAVENOUS | Status: DC
  Administered 2020-03-08: 1100 [IU]/h via INTRAVENOUS
  Administered 2020-03-09: 1000 [IU]/h via INTRAVENOUS
  Filled 2020-03-08: qty 250

## 2020-03-08 NOTE — ED Triage Notes (Signed)
Pt c/o weakness, SOB, dizziness and fatigue x2 days. Pt drove self to ED and was ambulatory to triage with slow gait.

## 2020-03-08 NOTE — Progress Notes (Signed)
ANTICOAGULATION CONSULT NOTE - Initial Consult  Pharmacy Consult for heparin Indication: chest pain/ACS  Allergies  Allergen Reactions  . Penicillins Hives    Patient Measurements: Weight: 108.9 kg (240 lb) Heparin Dosing Weight: 77 kg  Vital Signs: Temp: 98 F (36.7 C) (08/11 1956) Temp Source: Oral (08/11 1956) BP: 135/99 (08/11 1956) Pulse Rate: 91 (08/11 1956)  Labs: Recent Labs    03/08/20 1957 03/08/20 2135  HGB 12.8  --   HCT 41.0  --   PLT 114*  --   APTT  --  25  LABPROT  --  13.3  INR  --  1.1  CREATININE 1.77*  --   TROPONINIHS 615*  --     Estimated Creatinine Clearance: 35.8 mL/min (A) (by C-G formula based on SCr of 1.77 mg/dL (H)).   Medical History: Past Medical History:  Diagnosis Date  . Anemia    vitamin b and d deficiency  . Anxiety   . Arthritis    osteoarthrits  . Chronic pain syndrome   . CKD (chronic kidney disease) stage 3, GFR 30-59 ml/min   . Complication of anesthesia    difficulty waking up  . DDD (degenerative disc disease), cervical   . Fibromyalgia    not accurate, per patient  . GERD (gastroesophageal reflux disease)   . Gout   . Hearing loss   . History of kidney stones 1987  . Hypertension   . Rheumatoid arthritis (HCC)    orencia infusions  . Vitamin B12 deficiency   . Vitamin D deficiency     Medications:  Scheduled:  . heparin  4,000 Units Intravenous Once    Assessment: Patient w/ h/o CKD, RA s/p knee replacement on 08/2019 arrives w/ c/o SOB and weakness, CXR clear, initial trop 615, EKG showing QTc prolongation of 527 w/ ST abnormalities in III, aVF, V3, patient was on lovenox subq PTA for knee replacement; however, patient states she has not taken this since march of 2021 so no other anticoagulation PTA recently. Baseline CBC WNL, aPTT/anti-Xa/INR pending. Patient is being started on heparin drip for management of NSTEMI.  Goal of Therapy:  Heparin level 0.3-0.7 units/ml Monitor platelets by  anticoagulation protocol: Yes   Plan:  Will bolus heparin 4000 units IV x 1 Will start rate at 1100 units/hr and will check anti-Xa at 0600. Will monitor daily CBC's and adjust per anti-Xa levels.  Tobie Lords, PharmD, BCPS Clinical Pharmacist 03/08/2020,10:07 PM

## 2020-03-08 NOTE — H&P (Signed)
History and Physical    Sabrina Green [id number] DOB: 1946-02-05 DOA: 03/08/2020  PCP: Sofie Hartigan, MD  Patient coming from: Home  I have personally briefly reviewed patient's old medical records in [place]  Chief Complaint: Increasing fatigue and shortness of breath  HPI: Sabrina Green is a 74 y.o. female with medical history significant for hypertension, asthma, IBS, NASH, rheumatoid arthritis, gout, CKD stage IIIb who presents with concerns of fatigue and increasing shortness of breath.  Patient reports that she has noticed about 3 weeks of dizziness.  No room spinning sensation but feels unsteady on her feet.  Dizziness lasts all day and is associated with shortness of breath.  States resting makes her feel better.  Then yesterday while sitting on the toilet, she began to feel very diaphoretic and felt like she was going to "pass out" and was so weak that she could not get up.  Throughout the day every time she would get up to do things around the house the same symptoms would occur and she does have to lay in bed all day.  She also noticed midsternal chest pressure like "something sitting on her chest" and this lasted for about an hour even with her laying down.  She denies palpitations but feels like her heart is pounding.  She denies any new lower extremity edema, orthopnea or PND.  States she was started on a new blood pressure medication last month by nephrology since her blood pressure has been high. She denies any previous cardiac history although states that she had an abnormal stress test perhaps around 2008 and had a heart cath that was normal..  No recent travel.  Denies tobacco or illicit drug use.  Occasional alcohol use.  ED Course: She was afebrile normotensive and stable on room air.  Troponin of 615 and trended upward to 625.  EKG shows new inverted T waves with V1 through V5 that is new from prior.  No leukocytosis or anemia.  Has chronic  thrombocytopenia that is slightly lower than prior at 114.  Potassium 4.  Creatinine of 1.77 which is stable.  Patient does not have any current chest pain but continues to feel shortness of breath.  Review of Systems:  Constitutional: No Weight Change, No Fever ENT/Mouth: No sore throat, No Rhinorrhea Eyes: No Eye Pain, No Vision Changes Cardiovascular:+ Chest Pain, + SOB, No PND, No Dyspnea on Exertion, No Orthopnea, No Edema, No Palpitations Respiratory: No Cough, No Sputum, No Wheezing, no Dyspnea  Gastrointestinal: No Nausea, No Vomiting, No Diarrhea, No Constipation, No Pain Genitourinary: no Urinary Incontinence Musculoskeletal: No Arthralgias, No Myalgias Skin: No Skin Lesions, No Pruritus, Neuro: no Weakness, No Numbness Psych: No Anxiety/Panic, No Depression, no decrease appetite Heme/Lymph: No Bruising, No Bleeding  Past Medical History:  Diagnosis Date  . Anemia    vitamin b and d deficiency  . Anxiety   . Arthritis    osteoarthrits  . Chronic pain syndrome   . CKD (chronic kidney disease) stage 3, GFR 30-59 ml/min   . Complication of anesthesia    difficulty waking up  . DDD (degenerative disc disease), cervical   . Fibromyalgia    not accurate, per patient  . GERD (gastroesophageal reflux disease)   . Gout   . Hearing loss   . History of kidney stones 1987  . Hypertension   . Rheumatoid arthritis (HCC)    orencia infusions  . Vitamin B12 deficiency   . Vitamin D deficiency  Past Surgical History:  Procedure Laterality Date  . ABDOMINAL HYSTERECTOMY    . ABDOMINAL HYSTERECTOMY    . [place], [place].   METAL IN ANKLE  . BREAST BIOPSY Right 02/09/2019   affirm bx of calcs, ribbon marker, path pending  . CESAREAN SECTION    . CHOLECYSTECTOMY    . EYE SURGERY Bilateral 2015   cataract surgery, [place], [place]  . KNEE ARTHROSCOPY    . TONSILLECTOMY    . TOTAL KNEE ARTHROPLASTY Left 09/21/2019    Procedure: TOTAL KNEE ARTHROPLASTY;  Surgeon: Corky Mull, MD;  Location: [facility] ORS;  Service: Orthopedics;  Laterality: Left;  . TUBAL LIGATION       reports that she has never smoked. She has never used smokeless tobacco. She reports that she does not drink alcohol and does not use drugs. Social History  Allergies  Allergen Reactions  . Penicillins Hives    Family History  Problem Relation Age of Onset  . Breast cancer Other 50  . Hypertension Mother   . Migraines Mother   . Other Father      Prior to Admission medications   Medication Sig Start Date End Date Taking? Authorizing Provider  Abatacept 125 MG/ML SOSY Inject 125 mg into the skin every 30 (thirty) days. Next dose March 15th    [provider]  allopurinol (ZYLOPRIM) 300 MG tablet Take 300 mg by mouth daily. In the morning    [provider]  busPIRone (BUSPAR) 5 MG tablet Take 5 mg by mouth 4 (four) times daily as needed (anxiety).     [provider]  chlorthalidone (HYGROTON) 25 MG tablet Take 12.5 mg by mouth daily.  08/10/19 08/09/20  [provider]  Cyanocobalamin (VITAMIN B-12) 5000 MCG TBDP Take 5,000 mcg by mouth daily.    [provider]  diclofenac Sodium (VOLTAREN) 1 % GEL Apply 2 g topically 4 (four) times daily as needed (pain.).  08/07/18   [provider]  enoxaparin (LOVENOX) 40 MG/0.4ML injection Inject 0.4 mLs (40 mg total) into the skin daily. 09/24/19   Lattie Corns, PA-C  fluticasone (FLONASE) 50 MCG/ACT nasal spray Place 2 sprays into both nostrils daily.    [provider]  folic acid (FOLVITE) 700 MCG tablet Take 400 mcg by mouth daily.    [provider]  gabapentin (NEURONTIN) 300 MG capsule Take 1-3 capsules (300-900 mg total) by mouth 4 (four) times daily. Follow written titration schedule. Patient taking differently: Take 300 mg by mouth 4 (four) times daily. Follow written titration schedule. 08/17/19 02/13/20   Milinda Pointer, MD  losartan (COZAAR) 100 MG tablet Take 100 mg by mouth daily.    [provider]  lubiprostone (AMITIZA) 24 MCG capsule Take 24 mcg by mouth 2 (two) times daily as needed for constipation.     [provider]  Magnesium 400 MG TABS Take by mouth. NOT TAKING    [provider]  methotrexate 50 MG/2ML injection Inject 12.5 mg into the vein every Thursday. (0.5 ml)    [provider]  mupirocin ointment (BACTROBAN) 2 % Apply 1 application topically 3 (three) times daily. Patient taking differently: Apply 1 application topically 3 (three) times daily as needed (rash/skin irritation.).  09/29/17   Lorin Picket, PA-C  omeprazole (PRILOSEC) 20 MG capsule Take 20 mg by mouth daily before breakfast.     [provider]  oxyCODONE (OXY IR/ROXICODONE) 5 MG  immediate release tablet Take 1-2 tablets (5-10 mg total) by mouth every 4 (four) hours as needed for moderate pain (pain score 4-6). 09/23/19   Lattie Corns, PA-C  propranolol ER (INDERAL LA) 80 MG 24 hr capsule Take 80 mg by mouth daily.    [provider]  sertraline (ZOLOFT) 100 MG tablet Take 100 mg by mouth daily.     [provider]  tiZANidine (ZANAFLEX) 4 MG capsule Take 4 mg by mouth 2 (two) times daily as needed for muscle spasms.    [provider]  traMADol (ULTRAM) 50 MG tablet Take 1 tablet (50 mg total) by mouth every 6 (six) hours as needed for moderate pain. 09/23/19   Lattie Corns, PA-C    Physical Exam: Vitals:   03/08/20 1956 03/08/20 2117  BP: (!) 135/99   Pulse: 91   Resp: 18   Temp: 98 F (36.7 C)   TempSrc: Oral   SpO2: 100%   Weight:  108.9 kg    Constitutional: NAD, calm, comfortable, female younger than stated age laying at 14 degree incline in bed Vitals:   03/08/20 1956 03/08/20 2117  BP: (!) 135/99   Pulse: 91   Resp: 18   Temp: 98 F (36.7 C)   TempSrc: Oral   SpO2: 100%   Weight:  108.9 kg    Eyes: PERRL, lids and conjunctivae normal ENMT: Mucous membranes are moist.  Neck: normal, supple Respiratory: clear to auscultation bilaterally, no wheezing, no crackles.  Dyspnea with conversation but has oxygen saturation of 96% on room air.  No accessory muscle use.  Cardiovascular: Regular rate and rhythm, no murmurs / rubs / gallops. No extremity edema.  Mild right-sided midsternal chest pain with chest wall palpation. Abdomen: no tenderness, no masses palpated.  Bowel sounds positive.  Musculoskeletal: no clubbing / cyanosis. No joint deformity upper and lower extremities. Good ROM, no contractures. Normal muscle tone.  Skin: no rashes, lesions, ulcers. No induration Neurologic: CN 2-12 grossly intact. Sensation intact,  Strength 5/5 in all 4.  Psychiatric: Normal judgment and insight. Alert and oriented x 3. Normal mood.     Labs on Admission: I have personally reviewed following labs and imaging studies  CBC: Recent Labs  Lab 03/08/20 1957  WBC 8.0  HGB 12.8  HCT 41.0  MCV 91.1  PLT 947*   Basic Metabolic Panel: Recent Labs  Lab 03/08/20 1957  NA 141  K 4.0  CL 105  CO2 21*  GLUCOSE 91  BUN 38*  CREATININE 1.77*  CALCIUM 9.4   GFR: Estimated Creatinine Clearance: 35.8 mL/min (A) (by C-G formula based on SCr of 1.77 mg/dL (H)). Liver Function Tests: No results for input(s): AST, ALT, ALKPHOS, BILITOT, PROT, ALBUMIN in the last 168 hours. No results for input(s): LIPASE, AMYLASE in the last 168 hours. No results for input(s): AMMONIA in the last 168 hours. Coagulation Profile: Recent Labs  Lab 03/08/20 2135  INR ACCURACY OF RESULTS QUESTIONABLE. RECOMMEND RECOLLECT TO VERIFY.   Cardiac Enzymes: No results for input(s): CKTOTAL, CKMB, CKMBINDEX, TROPONINI in the last 168 hours. BNP (last 3 results) No results for input(s): PROBNP in the last 8760 hours. HbA1C: No results for input(s): HGBA1C in the last 72 hours. CBG: No results for input(s): GLUCAP  in the last 168 hours. Lipid Profile: No results for input(s): CHOL, HDL, LDLCALC, TRIG, CHOLHDL, LDLDIRECT in the last 72 hours. Thyroid Function Tests: No results for input(s): TSH, T4TOTAL, FREET4, T3FREE, THYROIDAB in the  last 72 hours. Anemia Panel: No results for input(s): VITAMINB12, FOLATE, FERRITIN, TIBC, IRON, RETICCTPCT in the last 72 hours. Urine analysis:    Component Value Date/Time   COLORURINE YELLOW (A) 09/17/2019 0807   APPEARANCEUR CLEAR (A) 09/17/2019 0807   LABSPEC 1.019 09/17/2019 0807   PHURINE 5.0 09/17/2019 0807   GLUCOSEU NEGATIVE 09/17/2019 0807   HGBUR NEGATIVE 09/17/2019 0807   BILIRUBINUR NEGATIVE 09/17/2019 0807   KETONESUR NEGATIVE 09/17/2019 0807   PROTEINUR NEGATIVE 09/17/2019 0807   NITRITE NEGATIVE 09/17/2019 0807   LEUKOCYTESUR NEGATIVE 09/17/2019 0807    Radiological Exams on Admission: DG Chest 2 View  Result Date: 03/08/2020 CLINICAL DATA:  Dyspnea EXAM: CHEST - 2 VIEW COMPARISON:  09/07/2017 FINDINGS: Lungs are well expanded, symmetric, and clear. No pneumothorax or pleural effusion. Cardiac size within normal limits. Pulmonary vascularity is normal. Osseous structures are age-appropriate. No acute bone abnormality. IMPRESSION: No active cardiopulmonary disease. Electronically Signed   By: Fidela Salisbury MD   On: 03/08/2020 20:29      Assessment/Plan  NSTEMI  Continue heparin infusion Continue trending troponin prn EKG for any new chest pain  Need cardiology consult in the morning for likely heart cath. Will keep NPO after midnight.   Prolonged QT  K of 4 check Magnesium Avoid QT prolongation medication  Hypertension Continue antihypertensives but hold beta-blocker due to prolonged QT  CKD stage 3b creatinine is stable   Chronic thrombocytopenia Possibly due to IV abatacept infusion for her RA Continue to monitor with CBC  Hx of RA gets outpatient infusion   DVT prophylaxis: IV heparin infusion  code Status:  Full Family Communication: Plan discussed with patient at bedside  disposition Plan: Home with at least 2 midnight stays  Consults called:  Admission status: inpatient  Status is: Inpatient  Remains inpatient appropriate because:Inpatient level of care appropriate due to severity of illness   Dispo: The patient is from: Home              Anticipated d/c is to: Home              Anticipated d/c date is: 2 days              Patient currently is not medically stable to d/c.         Orene Desanctis DO [facility]   If 7PM-7AM, please contact night-coverage [email or web address]   03/08/2020, 10:34 PM

## 2020-03-08 NOTE — ED Provider Notes (Signed)
[facility] Emergency Department Provider Note   ____________________________________________   First MD Initiated Contact with Patient 03/08/20 2105     (approximate)  I have reviewed the triage vital signs and the nursing notes.   HISTORY  Chief Complaint Shortness of Breath and Weakness    HPI Sabrina Green is a 74 y.o. female patient feeling weak and lightheaded for the last 2 days.  Had an episode yesterday when she was sitting on the toilet where she got even more lightheaded and sweaty and short of breath.  She drove herself here.  She has not felt like this before.  She does not have any chest tightness.  She is not sweating currently.  She did not complain of nausea.  She does have a history of chronic kidney disease and hypertension and rheumatoid arthritis.  Symptoms did not get worse with walking.         Past Medical History:  Diagnosis Date  . Anemia    vitamin b and d deficiency  . Anxiety   . Arthritis    osteoarthrits  . Chronic pain syndrome   . CKD (chronic kidney disease) stage 3, GFR 30-59 ml/min   . Complication of anesthesia    difficulty waking up  . DDD (degenerative disc disease), cervical   . Fibromyalgia    not accurate, per patient  . GERD (gastroesophageal reflux disease)   . Gout   . Hearing loss   . History of kidney stones 1987  . Hypertension   . Rheumatoid arthritis (HCC)    orencia infusions  . Vitamin B12 deficiency   . Vitamin D deficiency     Patient Active Problem List   Diagnosis Date Noted  . 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 Primary osteoarthritis involving multiple joints 08/16/2019  . Bursitis of left shoulder 02/22/2019  . [place] arthritis 02/01/2019  . Carpal tunnel syndrome on right 10/14/2018  . Carpal tunnel syndrome, left 10/14/2018  . Neurogenic pain 03/02/2018  . Tricompartmental disease of knee (Bilateral) 02/04/2018  . Chronic low back pain  (Secondary Area of Pain) (Bilateral) (L>R) 02/04/2018  . Lumbar facet syndrome (Bilateral) (L>R) 02/04/2018  . Spondylosis without myelopathy or radiculopathy, lumbosacral region 02/04/2018  . History of claustrophobia 01/05/2018  . History of panic attacks 01/05/2018  . Chronic shoulder pain (Bilateral) 01/05/2018  . Osteoarthritis of shoulder (Bilateral) 01/05/2018  . Osteoarthritis of knees (Bilateral) 01/05/2018  . Elevated C-reactive protein (CRP) 12/15/2017  . Chronic pain syndrome 12/11/2017  . Pharmacologic therapy 12/11/2017  . Disorder of skeletal system 12/11/2017  . Problems influencing health status 12/11/2017  . Chronic knee pain (Primary Area of Pain) (Bilateral) (L>R) 12/11/2017  . Chronic lower extremity pain [facility] Area of Pain) (Left) 12/11/2017  . Chronic shoulder pain (Fourth Area of Pain) (Right) 12/11/2017  . Chronic hip pain (Left) 12/11/2017  . Fatty liver disease, nonalcoholic 16/04/9603  . DDD (degenerative disc disease), lumbar 06/02/2017  . Chronic low back pain (Midline) 04/22/2017  . CKD (chronic kidney disease) stage 3, GFR 30-59 ml/min 03/28/2017  . BMI 40.0-44.9, adult ([place]) 03/24/2017  . Elevated erythrocyte sedimentation rate 02/12/2017  . Osteoarthritis of knee (Left) 12/26/2016  . Shoulder bursitis (Right) 06/06/2016  . Encounter for long-term (current) use of high-risk medication 03/06/2016  . Long term current use of opiate analgesic 03/06/2016  . Chronic gout 02/26/2016  . Essential hypertension 02/26/2016  . Exercise-induced asthma 02/26/2016  . Fibromyalgia 02/26/2016  .  GERD without esophagitis 02/26/2016  . IBS (irritable bowel syndrome) 02/26/2016  . Rheumatoid arthritis involving multiple sites ([place]) 02/26/2016  . Vitamin B12 deficiency 02/26/2016  . Vitamin D deficiency 02/26/2016    Past Surgical History:  Procedure Laterality Date  . ABDOMINAL HYSTERECTOMY    . ABDOMINAL HYSTERECTOMY    . [place], [place].   METAL IN ANKLE  . BREAST BIOPSY Right 02/09/2019   affirm bx of calcs, ribbon marker, path pending  . CESAREAN SECTION    . CHOLECYSTECTOMY    . EYE SURGERY Bilateral 2015   cataract surgery, [place], [place]  . KNEE ARTHROSCOPY    . TONSILLECTOMY    . TOTAL KNEE ARTHROPLASTY Left 09/21/2019   Procedure: TOTAL KNEE ARTHROPLASTY;  Surgeon: Corky Mull, MD;  Location: [facility] ORS;  Service: Orthopedics;  Laterality: Left;  . TUBAL LIGATION      Prior to Admission medications   Medication Sig Start Date End Date Taking? Authorizing Provider  Abatacept 125 MG/ML SOSY Inject 125 mg into the skin every 30 (thirty) days. Next dose March 15th    [provider]  allopurinol (ZYLOPRIM) 300 MG tablet Take 300 mg by mouth daily. In the morning    [provider]  busPIRone (BUSPAR) 5 MG tablet Take 5 mg by mouth 4 (four) times daily as needed (anxiety).     [provider]  chlorthalidone (HYGROTON) 25 MG tablet Take 12.5 mg by mouth daily.  08/10/19 08/09/20  [provider]  Cyanocobalamin (VITAMIN B-12) 5000 MCG TBDP Take 5,000 mcg by mouth daily.    [provider]  diclofenac Sodium (VOLTAREN) 1 % GEL Apply 2 g topically 4 (four) times daily as needed (pain.).  08/07/18   [provider]  enoxaparin (LOVENOX) 40 MG/0.4ML injection Inject 0.4 mLs (40 mg total) into the skin daily. 09/24/19   Lattie Corns, PA-C  fluticasone (FLONASE) 50 MCG/ACT nasal spray Place 2 sprays into both nostrils daily.    [provider]  folic acid (FOLVITE) 144 MCG tablet Take 400 mcg by mouth daily.    [provider]  gabapentin (NEURONTIN) 300 MG capsule Take 1-3 capsules (300-900 mg total) by mouth 4 (four) times daily. Follow written titration schedule. Patient taking differently: Take 300 mg by mouth 4 (four) times daily. Follow written titration schedule. 08/17/19 02/13/20  Milinda Pointer, MD   losartan (COZAAR) 100 MG tablet Take 100 mg by mouth daily.    [provider]  lubiprostone (AMITIZA) 24 MCG capsule Take 24 mcg by mouth 2 (two) times daily as needed for constipation.     [provider]  Magnesium 400 MG TABS Take by mouth. NOT TAKING    [provider]  methotrexate 50 MG/2ML injection Inject 12.5 mg into the vein every Thursday. (0.5 ml)    [provider]  mupirocin ointment (BACTROBAN) 2 % Apply 1 application topically 3 (three) times daily. Patient taking differently: Apply 1 application topically 3 (three) times daily as needed (rash/skin irritation.).  09/29/17   Lorin Picket, PA-C  omeprazole (PRILOSEC) 20 MG capsule Take 20 mg by mouth daily before breakfast.     [provider]  oxyCODONE (OXY IR/ROXICODONE) 5 MG immediate release tablet Take 1-2 tablets (5-10 mg total) by mouth every 4 (four) hours as needed for moderate pain (pain score 4-6). 09/23/19   Lattie Corns, PA-C  propranolol ER (INDERAL LA) 80 MG  24 hr capsule Take 80 mg by mouth daily.    [provider]  sertraline (ZOLOFT) 100 MG tablet Take 100 mg by mouth daily.     [provider]  tiZANidine (ZANAFLEX) 4 MG capsule Take 4 mg by mouth 2 (two) times daily as needed for muscle spasms.    [provider]  traMADol (ULTRAM) 50 MG tablet Take 1 tablet (50 mg total) by mouth every 6 (six) hours as needed for moderate pain. 09/23/19   Lattie Corns, PA-C    Allergies Penicillins  Family History  Problem Relation Age of Onset  . Breast cancer Other 50  . Hypertension Mother   . Migraines Mother   . Other Father     Social History Social History   Tobacco Use  . Smoking status: Never Smoker  . Smokeless tobacco: Never Used  Vaping Use  . Vaping Use: Never used  Substance Use Topics  . Alcohol use: No  . Drug use: No    Review of Systems  Constitutional: No fever/chills Eyes: No visual changes. ENT:  No sore throat. Cardiovascular: Denies chest pain. Respiratory: Denies shortness of breath. Gastrointestinal: No abdominal pain.  No nausea, no vomiting.  No diarrhea.  No constipation. Genitourinary: Negative for dysuria. Musculoskeletal: Some chronic back pain. Skin: Negative for rash. Neurological: Negative for headaches, focal weakness  ____________________________________________   PHYSICAL EXAM:  VITAL SIGNS: ED Triage Vitals [03/08/20 1956]  Enc Vitals Group     BP (!) 135/99     Pulse Rate 91     Resp 18     Temp 98 F (36.7 C)     Temp Source Oral     SpO2 100 %     Weight      Height      Head Circumference      Peak Flow      Pain Score      Pain Loc      Pain Edu?      Excl. in [place]?     Constitutional: Alert and oriented. Well appearing and in no acute distress. Eyes: Conjunctivae are normal.  Head: Atraumatic. Nose: No congestion/rhinnorhea. Mouth/Throat: Mucous membranes are moist.  Oropharynx non-erythematous. Neck: No stridor.   Cardiovascular: Normal rate, regular rhythm. Grossly normal heart sounds.  Good peripheral circulation. Respiratory: Normal respiratory effort.  No retractions. Lungs CTAB. Gastrointestinal: Soft and nontender. No distention. No abdominal bruits. No CVA tenderness. Musculoskeletal: No lower extremity tenderness nor edema.  . Neurologic:  Normal speech and language. No gross focal neurologic deficits are appreciated.  Skin:  Skin is warm, dry and intact. No rash noted.   ____________________________________________   LABS (all labs ordered are listed, but only abnormal results are displayed)  Labs Reviewed  BASIC METABOLIC PANEL - Abnormal; Notable for the following components:      Result Value   CO2 21 (*)    BUN 38 (*)    Creatinine, Ser 1.77 (*)    GFR calc non Af Amer 28 (*)    GFR calc Af Amer 33 (*)    All other components within normal limits  CBC - Abnormal; Notable for the following components:   Platelets  114 (*)    All other components within normal limits  TROPONIN I (HIGH SENSITIVITY) - Abnormal; Notable for the following components:   Troponin I (High Sensitivity) 615 (*)    All other components within normal limits  SARS CORONAVIRUS 2 BY RT PCR [facility] ORDER,  PERFORMED IN [place] LAB)  URINALYSIS, COMPLETE (UACMP) WITH MICROSCOPIC  CBG MONITORING, ED  TROPONIN I (HIGH SENSITIVITY)   ____________________________________________  EKG  EKG read interpreted by me shows normal sinus rhythm rate of 94 normal axis flipped T's inferiorly and in V2 through 5 everything except for lead III is new. ____________________________________________  RADIOLOGY  ED MD interpretation: Chest x-ray read by radiology reviewed by me is negative for acute changes  Official radiology report(s): DG Chest 2 View  Result Date: 03/08/2020 CLINICAL DATA:  Dyspnea EXAM: CHEST - 2 VIEW COMPARISON:  09/07/2017 FINDINGS: Lungs are well expanded, symmetric, and clear. No pneumothorax or pleural effusion. Cardiac size within normal limits. Pulmonary vascularity is normal. Osseous structures are age-appropriate. No acute bone abnormality. IMPRESSION: No active cardiopulmonary disease. Electronically Signed   By: Fidela Salisbury MD   On: 03/08/2020 20:29    ____________________________________________   PROCEDURES  Procedure(s) performed (including Critical Care):  Procedures   ____________________________________________   INITIAL IMPRESSION / ASSESSMENT AND PLAN / ED COURSE  Patient with chronic kidney disease and GFR in 88s.  This is not new.  Her troponin is markedly elevated at 615.  This is much more than I would expect for her GFR.  Additionally she has symptoms and new EKG changes.  I will get her on aspirin and heparin.  We will get her in the hospital for an likely NSTEMI.              ____________________________________________   FINAL CLINICAL IMPRESSION(S) / ED  DIAGNOSES  Final diagnoses:  NSTEMI (non-ST elevated myocardial infarction) [facility])     ED Discharge Orders    None       Note:  This document was prepared using Dragon voice recognition software and may include unintentional dictation errors.    Nena Polio, MD 03/08/20 2121

## 2020-03-09 ENCOUNTER — Encounter: Payer: Self-pay | Admitting: Family Medicine

## 2020-03-09 ENCOUNTER — Inpatient Hospital Stay: Payer: Medicare Other

## 2020-03-09 ENCOUNTER — Inpatient Hospital Stay: Admission: RE | Admit: 2020-03-09 | Payer: Medicare Other | Source: Ambulatory Visit

## 2020-03-09 ENCOUNTER — Inpatient Hospital Stay (HOSPITAL_COMMUNITY)
Admit: 2020-03-09 | Discharge: 2020-03-09 | Disposition: A | Discharge status: Home / Self Care | Payer: Medicare Other | Attending: Cardiovascular Disease | Admitting: Cardiovascular Disease

## 2020-03-09 ENCOUNTER — Other Ambulatory Visit: Payer: Self-pay

## 2020-03-09 DIAGNOSIS — R0603 Acute respiratory distress: Secondary | ICD-10-CM

## 2020-03-09 DIAGNOSIS — I361 Nonrheumatic tricuspid (valve) insufficiency: Secondary | ICD-10-CM

## 2020-03-09 DIAGNOSIS — I249 Acute ischemic heart disease, unspecified: Secondary | ICD-10-CM

## 2020-03-09 DIAGNOSIS — R778 Other specified abnormalities of plasma proteins: Secondary | ICD-10-CM

## 2020-03-09 DIAGNOSIS — R42 Dizziness and giddiness: Secondary | ICD-10-CM

## 2020-03-09 DIAGNOSIS — R0789 Other chest pain: Secondary | ICD-10-CM

## 2020-03-09 LAB — CBC
HCT: 33.9 % — ABNORMAL LOW (ref 36.0–46.0)
Hemoglobin: 11.4 g/dL — ABNORMAL LOW (ref 12.0–15.0)
MCH: 28.6 pg (ref 26.0–34.0)
MCHC: 33.6 g/dL (ref 30.0–36.0)
MCV: 85.2 fL (ref 80.0–100.0)
Platelets: 100 10*3/uL — ABNORMAL LOW (ref 150–400)
RBC: 3.98 MIL/uL (ref 3.87–5.11)
RDW: 14.7 % (ref 11.5–15.5)
WBC: 7.9 10*3/uL (ref 4.0–10.5)
nRBC: 0 % (ref 0.0–0.2)

## 2020-03-09 LAB — URINALYSIS, COMPLETE (UACMP) WITH MICROSCOPIC
Bacteria, UA: NONE SEEN
Bilirubin Urine: NEGATIVE
Glucose, UA: NEGATIVE mg/dL
Hgb urine dipstick: NEGATIVE
Ketones, ur: NEGATIVE mg/dL
Leukocytes,Ua: NEGATIVE
Nitrite: NEGATIVE
Protein, ur: NEGATIVE mg/dL
Specific Gravity, Urine: 1.02 (ref 1.005–1.030)
pH: 5 (ref 5.0–8.0)

## 2020-03-09 LAB — ECHOCARDIOGRAM COMPLETE
AR max vel: 2.33 cm2
AV Area VTI: 2.13 cm2
AV Area mean vel: 2.24 cm2
AV Mean grad: 3 mmHg
AV Peak grad: 6.8 mmHg
Ao pk vel: 1.3 m/s
Area-P 1/2: 4.49 cm2
S' Lateral: 3.37 cm
Weight: 3840 oz

## 2020-03-09 LAB — TROPONIN I (HIGH SENSITIVITY)
Troponin I (High Sensitivity): 714 ng/L (ref <18)
Troponin I (High Sensitivity): 593 ng/L (ref <18)
Troponin I (High Sensitivity): 515 ng/L (ref <18)

## 2020-03-09 LAB — BASIC METABOLIC PANEL
Anion gap: 13 (ref 5–15)
BUN: 40 mg/dL — ABNORMAL HIGH (ref 8–23)
CO2: 21 mmol/L — ABNORMAL LOW (ref 22–32)
Calcium: 9.1 mg/dL (ref 8.9–10.3)
Chloride: 107 mmol/L (ref 98–111)
Creatinine, Ser: 1.55 mg/dL — ABNORMAL HIGH (ref 0.44–1.00)
GFR calc Af Amer: 39 mL/min — ABNORMAL LOW (ref >60)
GFR calc non Af Amer: 33 mL/min — ABNORMAL LOW (ref >60)
Glucose, Bld: 99 mg/dL (ref 70–99)
Potassium: 3.7 mmol/L (ref 3.5–5.1)
Sodium: 141 mmol/L (ref 135–145)

## 2020-03-09 LAB — LIPID PANEL
Cholesterol: 183 mg/dL (ref 0–200)
HDL: 46 mg/dL (ref >40)
LDL Cholesterol: 121 mg/dL — ABNORMAL HIGH (ref 0–99)
Total CHOL/HDL Ratio: 4 RATIO
Triglycerides: 80 mg/dL (ref <150)
VLDL: 16 mg/dL (ref 0–40)

## 2020-03-09 LAB — HEPARIN LEVEL (UNFRACTIONATED)
Heparin Unfractionated: 0.74 IU/mL — ABNORMAL HIGH (ref 0.30–0.70)
Heparin Unfractionated: 0.69 IU/mL (ref 0.30–0.70)
Heparin Unfractionated: 0.63 IU/mL (ref 0.30–0.70)

## 2020-03-09 LAB — APTT: aPTT: >160 seconds (ref 24–36)

## 2020-03-09 LAB — FIBRIN DERIVATIVES D-DIMER (ARMC ONLY): Fibrin derivatives D-dimer (ARMC): >7500 ng/mL (FEU) — ABNORMAL HIGH (ref 0.00–499.00)

## 2020-03-09 LAB — PROTIME-INR
INR: 1.2 (ref 0.8–1.2)
Prothrombin Time: 14.6 seconds (ref 11.4–15.2)

## 2020-03-09 LAB — MAGNESIUM: Magnesium: 1.7 mg/dL (ref 1.7–2.4)

## 2020-03-09 MED ORDER — BUSPIRONE HCL 5 MG PO TABS
5.0000 mg | ORAL_TABLET | Freq: Four times a day (QID) | ORAL | Status: DC | PRN
  Filled 2020-03-09: qty 1

## 2020-03-09 MED ORDER — ALLOPURINOL 300 MG PO TABS
300.0000 mg | ORAL_TABLET | Freq: Every day | ORAL | Status: DC
  Administered 2020-03-09 – 2020-03-13 (×4): 300 mg via ORAL
  Filled 2020-03-09 (×5): qty 1

## 2020-03-09 MED ORDER — CHLORTHALIDONE 25 MG PO TABS
12.5000 mg | ORAL_TABLET | Freq: Every day | ORAL | Status: DC
  Administered 2020-03-09 – 2020-03-10 (×2): 12.5 mg via ORAL
  Filled 2020-03-09 (×3): qty 0.5

## 2020-03-09 MED ORDER — PROPRANOLOL HCL ER 80 MG PO CP24: 80.0000 mg | ORAL_CAPSULE | Freq: Every day | ORAL | Status: DC

## 2020-03-09 MED ORDER — OXYCODONE HCL 5 MG PO TABS
5.0000 mg | ORAL_TABLET | ORAL | Status: DC | PRN
  Administered 2020-03-09: 5 mg via ORAL
  Filled 2020-03-09: qty 1

## 2020-03-09 MED ORDER — POTASSIUM CHLORIDE CRYS ER 20 MEQ PO TBCR
20.0000 meq | EXTENDED_RELEASE_TABLET | Freq: Once | ORAL | Status: AC
  Administered 2020-03-09: 20 meq via ORAL
  Filled 2020-03-09: qty 1

## 2020-03-09 MED ORDER — IOHEXOL 350 MG/ML SOLN
60.0000 mL | Freq: Once | INTRAVENOUS | Status: AC | PRN
  Administered 2020-03-09: 60 mL via INTRAVENOUS

## 2020-03-09 MED ORDER — PERFLUTREN LIPID MICROSPHERE
1.0000 mL | INTRAVENOUS | Status: AC | PRN
  Administered 2020-03-09: 2 mL via INTRAVENOUS
  Filled 2020-03-09: qty 10

## 2020-03-09 MED ORDER — LOSARTAN POTASSIUM 50 MG PO TABS
100.0000 mg | ORAL_TABLET | Freq: Every day | ORAL | Status: DC
  Administered 2020-03-09 – 2020-03-10 (×2): 100 mg via ORAL
  Filled 2020-03-09 (×2): qty 2

## 2020-03-09 MED ORDER — MAGNESIUM SULFATE 2 GM/50ML IV SOLN
2.0000 g | Freq: Once | INTRAVENOUS | Status: AC
  Administered 2020-03-09: 2 g via INTRAVENOUS
  Filled 2020-03-09: qty 50

## 2020-03-09 MED ORDER — FOLIC ACID 1 MG PO TABS
500.0000 ug | ORAL_TABLET | Freq: Every day | ORAL | Status: DC
  Administered 2020-03-09 – 2020-03-13 (×5): 0.5 mg via ORAL
  Filled 2020-03-09 (×5): qty 1

## 2020-03-09 MED ORDER — TIZANIDINE HCL 4 MG PO TABS
4.0000 mg | ORAL_TABLET | Freq: Two times a day (BID) | ORAL | Status: DC | PRN
  Filled 2020-03-09: qty 1

## 2020-03-09 MED ORDER — GABAPENTIN 300 MG PO CAPS
300.0000 mg | ORAL_CAPSULE | Freq: Four times a day (QID) | ORAL | Status: DC
  Administered 2020-03-09 – 2020-03-11 (×7): 300 mg via ORAL
  Filled 2020-03-09 (×8): qty 1

## 2020-03-09 MED ORDER — PANTOPRAZOLE SODIUM 40 MG PO TBEC
40.0000 mg | DELAYED_RELEASE_TABLET | Freq: Every day | ORAL | Status: DC
  Administered 2020-03-09 – 2020-03-13 (×5): 40 mg via ORAL
  Filled 2020-03-09 (×5): qty 1

## 2020-03-09 MED ORDER — ASPIRIN EC 81 MG PO TBEC
81.0000 mg | DELAYED_RELEASE_TABLET | Freq: Every day | ORAL | Status: DC
  Administered 2020-03-09 – 2020-03-11 (×3): 81 mg via ORAL
  Filled 2020-03-09 (×3): qty 1

## 2020-03-09 NOTE — Progress Notes (Addendum)
ANTICOAGULATION CONSULT NOTE  Pharmacy Consult for heparin Indication: chest pain/ACS  Patient Measurements: Height: 5\' 5"  (165.1 cm) Weight: 107 kg (235 lb 14.4 oz) IBW/kg (Calculated) : 57 Heparin Dosing Weight: 77 kg  Labs: Recent Labs    03/08/20 1957 03/08/20 2117 03/08/20 2135 03/08/20 2328 03/09/20 0214 03/09/20 0557 03/09/20 1551  HGB 12.8  --   --   --   --  11.4*  --   HCT 41.0  --   --   --   --  33.9*  --   PLT 114*  --   --   --   --  100*  --   APTT  --   --  ACCURACY OF RESULTS QUESTIONABLE. RECOMMEND RECOLLECT TO VERIFY. >160*  --   --   --   LABPROT  --   --  ACCURACY OF RESULTS QUESTIONABLE. RECOMMEND RECOLLECT TO VERIFY. 14.6  --   --   --   INR  --   --  ACCURACY OF RESULTS QUESTIONABLE. RECOMMEND RECOLLECT TO VERIFY. 1.2  --   --   --   HEPARINUNFRC  --   --   --  0.74*  --  0.69 0.63  CREATININE 1.77*  --   --   --   --  1.55*  --   TROPONINIHS 615*   < >  --  714* 593* 515*  --    < > = values in this interval not displayed.    Estimated Creatinine Clearance: 40.5 mL/min (A) (by C-G formula based on SCr of 1.55 mg/dL (H)).   Medical History: Past Medical History:  Diagnosis Date  . Anemia    vitamin b and d deficiency  . Anxiety   . Arthritis    osteoarthrits  . Chronic pain syndrome   . CKD (chronic kidney disease) stage 3, GFR 30-59 ml/min   . Complication of anesthesia    difficulty waking up  . DDD (degenerative disc disease), cervical   . Fibromyalgia    not accurate, per patient  . GERD (gastroesophageal reflux disease)   . Gout   . Hearing loss   . History of kidney stones 1987  . Hypertension   . Rheumatoid arthritis (HCC)    orencia infusions  . Vitamin B12 deficiency   . Vitamin D deficiency     Medications:  Scheduled:  . allopurinol  300 mg Oral Daily  . aspirin EC  81 mg Oral Daily  . chlorthalidone  12.5 mg Oral Daily  . folic acid  637 mcg Oral Daily  . gabapentin  300 mg Oral QID  . losartan  100 mg Oral Daily   . pantoprazole  40 mg Oral Daily    Assessment: Patient w/ h/o CKD, RA s/p knee replacement on 08/2019 arrives w/ c/o SOB and weakness, CXR clear, initial trop 615, EKG showing QTc prolongation of 527 w/ ST abnormalities in III, aVF, V3, patient was on lovenox subq PTA for knee replacement; however, patient states she has not taken this since march of 2021 so no other anticoagulation PTA recently. Baseline CBC WNL. Baseline aPTT/anti-Xa/INR drawn after start of infusion. Patient is being started on heparin drip for management of NSTEMI.  Goal of Therapy:  Heparin level 0.3-0.7 units/ml Monitor platelets by anticoagulation protocol: Yes   Plan:  --08/12 @ 1551 HL 0.63 therapeutic x 1 after rate change. Continue rate at 1000 units/hr  --Re-check HL at midnight --CBC trending down will continue to monitor  [name] 03/09/2020,4:29 PM

## 2020-03-09 NOTE — Progress Notes (Signed)
Patient has bilateral pulmonary embolism with right heart strain, ratio 1.5.  Went to examine the patient she is on room air and eating dinner with her daughter who just arrived from [place].  Diagnosis explained to the patient.  Patient is on heparin since last 24 hours.  Plan: We will discuss with vascular intervention if clot retrival is indicated Continue heparin, if no further intervention will benefit, we discussed about restarting Eliquis on discharge.  If no intervention advised, may go home tomorrow on Eliquis.

## 2020-03-09 NOTE — ED Notes (Signed)
Patient aware that we need urine sample for testing, unable at this time. Pt given instruction on providing urine sample when able to do so.   

## 2020-03-09 NOTE — Plan of Care (Signed)
  Problem: Education: Goal: Knowledge of General Education information will improve Description Including pain rating scale, medication(s)/side effects and non-pharmacologic comfort measures Outcome: Progressing   

## 2020-03-09 NOTE — Progress Notes (Signed)
ANTICOAGULATION CONSULT NOTE - Initial Consult  Pharmacy Consult for heparin Indication: chest pain/ACS  Allergies  Allergen Reactions  . Penicillins Hives    Patient Measurements: Weight: 108.9 kg (240 lb) Heparin Dosing Weight: 77 kg  Vital Signs: Temp: 97.6 F (36.4 C) (08/12 0325) Temp Source: Oral (08/12 0325) BP: 112/58 (08/12 0600) Pulse Rate: 80 (08/12 0600)  Labs: Recent Labs    03/08/20 1957 03/08/20 1957 03/08/20 2117 03/08/20 2135 03/08/20 2328 03/09/20 0214 03/09/20 0557  HGB 12.8  --   --   --   --   --  11.4*  HCT 41.0  --   --   --   --   --  33.9*  PLT 114*  --   --   --   --   --  100*  APTT  --   --   --  ACCURACY OF RESULTS QUESTIONABLE. RECOMMEND RECOLLECT TO VERIFY. >160*  --   --   LABPROT  --   --   --  ACCURACY OF RESULTS QUESTIONABLE. RECOMMEND RECOLLECT TO VERIFY. 14.6  --   --   INR  --   --   --  ACCURACY OF RESULTS QUESTIONABLE. RECOMMEND RECOLLECT TO VERIFY. 1.2  --   --   HEPARINUNFRC  --   --   --   --  0.74*  --  0.69  CREATININE 1.77*  --   --   --   --   --  1.55*  TROPONINIHS 615*   < > 625*  --  714* 593*  --    < > = values in this interval not displayed.    Estimated Creatinine Clearance: 40.9 mL/min (A) (by C-G formula based on SCr of 1.55 mg/dL (H)).   Medical History: Past Medical History:  Diagnosis Date  . Anemia    vitamin b and d deficiency  . Anxiety   . Arthritis    osteoarthrits  . Chronic pain syndrome   . CKD (chronic kidney disease) stage 3, GFR 30-59 ml/min   . Complication of anesthesia    difficulty waking up  . DDD (degenerative disc disease), cervical   . Fibromyalgia    not accurate, per patient  . GERD (gastroesophageal reflux disease)   . Gout   . Hearing loss   . History of kidney stones 1987  . Hypertension   . Rheumatoid arthritis (HCC)    orencia infusions  . Vitamin B12 deficiency   . Vitamin D deficiency     Medications:  Scheduled:  . chlorthalidone  12.5 mg Oral Daily  .  losartan  100 mg Oral Daily    Assessment: Patient w/ h/o CKD, RA s/p knee replacement on 08/2019 arrives w/ c/o SOB and weakness, CXR clear, initial trop 615, EKG showing QTc prolongation of 527 w/ ST abnormalities in III, aVF, V3, patient was on lovenox subq PTA for knee replacement; however, patient states she has not taken this since march of 2021 so no other anticoagulation PTA recently. Baseline CBC WNL, aPTT/anti-Xa/INR pending. Patient is being started on heparin drip for management of NSTEMI.  Goal of Therapy:  Heparin level 0.3-0.7 units/ml Monitor platelets by anticoagulation protocol: Yes   Plan:  08/12 @ 0600 HL 0.69 therapeutic, but borderline. Will decrease rate to 1000 units/hr and will recheck HL at 1500, CBC trending down will continue to monitor.  Tobie Lords, PharmD, BCPS Clinical Pharmacist 03/09/2020,6:52 AM

## 2020-03-09 NOTE — ED Notes (Signed)
Verified heparin rate/dose change with Claiborne Billings, RN

## 2020-03-09 NOTE — ED Notes (Signed)
Message sent to Dr Sloan Leiter for pt folic acid order

## 2020-03-09 NOTE — Progress Notes (Signed)
PROGRESS NOTE    Sabrina Green  HYI:[id number] DOB: May 16, 1946 DOA: 03/08/2020 PCP: Sofie Hartigan, MD    Brief Narrative:  74 year old female with history of hypertension, asthma, IBS, [name], rheumatoid arthritis on methotrexate and abatacept, chronic pain syndrome with arthritis presented to the hospital with fatigue and increasing shortness of breath ongoing for more than 1 month and worse for the last few days.  Also had episodes of left-sided precordial chest discomfort. In the emergency room afebrile.  Troponin 600 and.  EKG shows new inverted T waves on anterior leads.  Chronic thrombocytopenia.  Creatinine 1.77 which is stable.  Increased QT interval.  Started on heparin for non-STEMI and admitted to hospital.   Assessment & Plan:   Principal Problem:   NSTEMI (non-ST elevated myocardial infarction) ([place]) Active Problems:   CKD (chronic kidney disease) stage 3, GFR 30-59 ml/min   Essential hypertension   Rheumatoid arthritis involving multiple sites (HCC)   Prolonged QT interval   Thrombocytopenia (HCC)  Non-ST relation MI in a patient with underlying connective tissue disorder and multiple cardiovascular problems: Currently hemodynamically stable.  Patient is currently chest pain-free. Started on heparin infusion that we will continue.  Aspirin 324 given in the ER, will start aspirin 81 mg daily.  Continue chlorthalidone and home dose of losartan. Continue heparin infusion. Called and discussed with cardiology for evaluation. 2D echocardiogram today.  Keep n.p.o. until cardiology evaluation if she needs cardiac cath. We'll add lipid profile in the morning labs for risk stratification.  Rheumatoid arthritis and chronic pain syndrome: Hold methotrexate, she will take it at home.  She will resume her medications including allopurinol, opiate pain relief and gabapentin.  CKD stage IIIa: Creatinine at about baseline.  Thrombocytopenia: Chronic and stable.   DVT  prophylaxis: Heparin infusion   Code Status: Full code Family Communication: We'll call her daughter with cardiology plan. Disposition Plan: Status is: Inpatient  Remains inpatient appropriate because:Inpatient level of care appropriate due to severity of illness   Dispo: The patient is from: Home              Anticipated d/c is to: Home              Anticipated d/c date is: 2 days              Patient currently is not medically stable to d/c.         Consultants:   Cardiology  Procedures:   None  Antimicrobials:   None   Subjective: Patient seen and examined.  Waiting the emergency room for inpatient bed assignment.  So far at rest denies any chest pain or shortness of breath.  Feels dizzy and short of breath on ambulation.  Objective: Vitals:   03/09/20 0500 03/09/20 0600 03/09/20 0700 03/09/20 0800  BP: 114/60 (!) 112/58 113/65 113/64  Pulse: 80 80 83 89  Resp: 20 20 16 14   Temp:      TempSrc:      SpO2: 98% 100% 98% 98%  Weight:       No intake or output data in the 24 hours ending 03/09/20 0933 Filed Weights   03/08/20 2117  Weight: 108.9 kg    Examination:  General exam: Appears calm and comfortable Fairly comfortable on room air.  Without any active symptoms. Respiratory system: Clear to auscultation. Respiratory effort normal.  No added sounds. Cardiovascular system: S1 & S2 heard, RRR. No pedal edema. Gastrointestinal system: Abdomen is nondistended, soft and nontender. No  organomegaly or masses felt. Normal bowel sounds heard.  Obese and pendulous. Central nervous system: Alert and oriented. No focal neurological deficits. Extremities: Symmetric 5 x 5 power. Skin: No rashes, lesions or ulcers Psychiatry: Judgement and insight appear normal. Mood & affect appropriate.     Data Reviewed: I have personally reviewed following labs and imaging studies  CBC: Recent Labs  Lab 03/08/20 1957 03/09/20 0557  WBC 8.0 7.9  HGB 12.8 11.4*  HCT  41.0 33.9*  MCV 91.1 85.2  PLT 114* 749*   Basic Metabolic Panel: Recent Labs  Lab 03/08/20 1957 03/08/20 2328 03/09/20 0557  NA 141  --  141  K 4.0  --  3.7  CL 105  --  107  CO2 21*  --  21*  GLUCOSE 91  --  99  BUN 38*  --  40*  CREATININE 1.77*  --  1.55*  CALCIUM 9.4  --  9.1  MG  --  1.7  --    GFR: Estimated Creatinine Clearance: 40.9 mL/min (A) (by C-G formula based on SCr of 1.55 mg/dL (H)). Liver Function Tests: No results for input(s): AST, ALT, ALKPHOS, BILITOT, PROT, ALBUMIN in the last 168 hours. No results for input(s): LIPASE, AMYLASE in the last 168 hours. No results for input(s): AMMONIA in the last 168 hours. Coagulation Profile: Recent Labs  Lab 03/08/20 2135 03/08/20 2328  INR ACCURACY OF RESULTS QUESTIONABLE. RECOMMEND RECOLLECT TO VERIFY. 1.2   Cardiac Enzymes: No results for input(s): CKTOTAL, CKMB, CKMBINDEX, TROPONINI in the last 168 hours. BNP (last 3 results) No results for input(s): PROBNP in the last 8760 hours. HbA1C: No results for input(s): HGBA1C in the last 72 hours. CBG: No results for input(s): GLUCAP in the last 168 hours. Lipid Profile: No results for input(s): CHOL, HDL, LDLCALC, TRIG, CHOLHDL, LDLDIRECT in the last 72 hours. Thyroid Function Tests: No results for input(s): TSH, T4TOTAL, FREET4, T3FREE, THYROIDAB in the last 72 hours. Anemia Panel: No results for input(s): VITAMINB12, FOLATE, FERRITIN, TIBC, IRON, RETICCTPCT in the last 72 hours. Sepsis Labs: No results for input(s): PROCALCITON, LATICACIDVEN in the last 168 hours.  Recent Results (from the past 240 hour(s))  SARS Coronavirus 2 by RT PCR (hospital order, performed in [facility] hospital lab) Nasopharyngeal Nasopharyngeal Swab     Status: None   Collection Time: 03/08/20  9:17 PM   Specimen: Nasopharyngeal Swab  Result Value Ref Range Status   SARS Coronavirus 2 NEGATIVE NEGATIVE Final    Comment: (NOTE) SARS-CoV-2 target nucleic acids are NOT  DETECTED.  The SARS-CoV-2 RNA is generally detectable in upper and lower respiratory specimens during the acute phase of infection. The lowest concentration of SARS-CoV-2 viral copies this assay can detect is 250 copies / mL. A negative result does not preclude SARS-CoV-2 infection and should not be used as the sole basis for treatment or other patient management decisions.  A negative result may occur with improper specimen collection / handling, submission of specimen other than nasopharyngeal swab, presence of viral mutation(s) within the areas targeted by this assay, and inadequate number of viral copies (<250 copies / mL). A negative result must be combined with clinical observations, patient history, and epidemiological information.  Fact Sheet for Patients:   StrictlyIdeas.no  Fact Sheet for Healthcare Providers: BankingDealers.co.za  This test is not yet approved or  cleared by the [place] FDA and has been authorized for detection and/or diagnosis of SARS-CoV-2 by FDA under an Emergency Use Authorization (EUA).  This EUA will remain in effect (meaning this test can be used) for the duration of the COVID-19 declaration under Section 564(b)(1) of the Act, 21 U.S.C. section 360bbb-3(b)(1), unless the authorization is terminated or revoked sooner.  Performed at [facility], [address] [place],  [address]          Radiology Studies: DG Chest 2 View  Result Date: 03/08/2020 CLINICAL DATA:  Dyspnea EXAM: CHEST - 2 VIEW COMPARISON:  09/07/2017 FINDINGS: Lungs are well expanded, symmetric, and clear. No pneumothorax or pleural effusion. Cardiac size within normal limits. Pulmonary vascularity is normal. Osseous structures are age-appropriate. No acute bone abnormality. IMPRESSION: No active cardiopulmonary disease. Electronically Signed   By: Fidela Salisbury MD   On: 03/08/2020 20:29         Scheduled Meds: . aspirin EC  81 mg Oral Daily  . chlorthalidone  12.5 mg Oral Daily  . losartan  100 mg Oral Daily   Continuous Infusions: . heparin 1,000 Units/hr (03/09/20 0709)     LOS: 1 day    Time spent: 25 min    Barb Merino, MD [facility] Pager [phone]

## 2020-03-09 NOTE — Progress Notes (Signed)
[place] attempted visit per OR for AD education from ED RN before transfer to floor; when [facility] arrived, pt,'s dtr. Danielle in chair @ window but pt. out of room at procedure.  Dtr. shared pt. called her Tuesday PM and said she wasn't feeling well; when dtr. heard that pt. came to hospital with 'elevated cardiac enzymes' she interpreted this as a sign of heart attack and decided to drive from [place], [place] to be w/her mother.  Dtr. is 4th grade teacher and RN; additional dtr. lives here in [place] area.  [facility] made dtr. aware of [place] availability and left copy of AD for pt.'s perusal.

## 2020-03-09 NOTE — Consult Note (Signed)
Cardiology Consultation:   Patient ID: Sabrina Green; [id number]; 1945/10/27   Admit date: 03/08/2020 Date of Consult: 03/09/2020  Primary Care Provider: Sofie Hartigan, MD Primary Cardiologist: New to Sabrina Green - consult by Sabrina Green Primary Electrophysiologist:  None   Patient Profile:   Sabrina Green is a 74 y.o. female with a hx of CKD stage IIIa, RA, anemia of chronic disease, fibromyalgia, DDD, chronic back pain on opiates, osteoarthritis, and obesity who is being seen today for the evaluation of elevated troponin at the request of Sabrina Green.  History of Present Illness:   Sabrina Green has no previously known cardiac history. She was evaluated by [facility] in 2019 for preoperative risk stratification without workup needed at that time.   Over the past 2 to 3 weeks she has noted symptoms of dizziness, generalized fatigue and malaise, tinnitus, and shortness of breath which became worse over the past 2 days.  She reported 2 days ago while she was sitting on the toilet voiding she stood up and became very dizzy with associated lightheadedness, midsternal chest pressure, shortness of breath, and diaphoresis.she described the chest pressure as "something sitting on my chest."  Chest discomfort lasted for approximately 1 hour.  She denies any vertigo-like symptoms.  No recent URIs or GI illnesses.  Initially, she was going to be evaluated for the symptoms at an outside urgent care over this past weekend though due to wait time she left without being seen.  She had contacted her PCP and had an appointment scheduled with them for later this week.  However, symptoms progressed and she initially presented to an outside urgent care and was transferred to the ED.  Upon the patient's arrival to [facility] they were found to have stable vitals. EKG showed sinus rhythm with inferior and anterolateral T wave inversion as outlined below, CXR showed no acute process. Labs showed HS-Tn 615 with a  delta of 625 and ultimately peaking at 714 and is now down trending. BUN/SCr 38/1.77-->40/1.55, COVID-19 negative, HGB 12.8, PLT 114. In the ED, she received ASA 324 mg x 1 and was started on a heparin drip.  Upon admission, cardiology was consulted.  Currently, she denies any chest pain, dyspnea, or dizziness.  She does continue to feel weak.  Past Medical History:  Diagnosis Date  . Anemia    vitamin b and d deficiency  . Anxiety   . Arthritis    osteoarthrits  . Chronic pain syndrome   . CKD (chronic kidney disease) stage 3, GFR 30-59 ml/min   . Complication of anesthesia    difficulty waking up  . DDD (degenerative disc disease), cervical   . Fibromyalgia    not accurate, per patient  . GERD (gastroesophageal reflux disease)   . Gout   . Hearing loss   . History of kidney stones 1987  . Hypertension   . Rheumatoid arthritis (HCC)    orencia infusions  . Vitamin B12 deficiency   . Vitamin D deficiency     Past Surgical History:  Procedure Laterality Date  . ABDOMINAL HYSTERECTOMY    . ABDOMINAL HYSTERECTOMY    . [place], [place].   METAL IN ANKLE  . BREAST BIOPSY Right 02/09/2019   affirm bx of calcs, ribbon marker, path pending  . CESAREAN SECTION    . CHOLECYSTECTOMY    . EYE SURGERY Bilateral 2015   cataract surgery, [place], [place]  . KNEE ARTHROSCOPY    .  TONSILLECTOMY    . TOTAL KNEE ARTHROPLASTY Left 09/21/2019   Procedure: TOTAL KNEE ARTHROPLASTY;  Surgeon: Sabrina Mull, MD;  Location: [facility] ORS;  Service: Orthopedics;  Laterality: Left;  . TUBAL LIGATION       Home Meds: Prior to Admission medications   Medication Sig Start Date End Date Taking? Authorizing Provider  Abatacept 125 MG/ML SOSY Inject 125 mg into the skin every 30 (thirty) days. Next dose March 15th   Yes [provider]  allopurinol (ZYLOPRIM) 300 MG tablet Take 300 mg by mouth daily. In the morning   Yes [provider]    Biotin 5000 MCG TABS Take 5,000 mcg by mouth daily.   Yes [provider]  busPIRone (BUSPAR) 5 MG tablet Take 5 mg by mouth 4 (four) times daily as needed (anxiety).    Yes [provider]  chlorthalidone (HYGROTON) 25 MG tablet Take 12.5 mg by mouth daily.  08/10/19 08/09/20 Yes [provider]  Cyanocobalamin (VITAMIN B-12) 5000 MCG TBDP Take 5,000 mcg by mouth daily.   Yes [provider]  diclofenac Sodium (VOLTAREN) 1 % GEL Apply 2 g topically 4 (four) times daily as needed (pain.).  08/07/18  Yes [provider]  fluticasone (FLONASE) 50 MCG/ACT nasal spray Place 2 sprays into both nostrils daily.   Yes [provider]  folic acid (FOLVITE) 272 MCG tablet Take 400 mcg by mouth daily.   Yes [provider]  gabapentin (NEURONTIN) 300 MG capsule Take 1-3 capsules (300-900 mg total) by mouth 4 (four) times daily. Follow written titration schedule. Patient taking differently: Take 300 mg by mouth 4 (four) times daily. Follow written titration schedule. 08/17/19 03/09/20 Yes Sabrina Pointer, MD  losartan (COZAAR) 100 MG tablet Take 100 mg by mouth daily.   Yes [provider]  lubiprostone (AMITIZA) 24 MCG capsule Take 24 mcg by mouth 2 (two) times daily as needed for constipation.    Yes [provider]  magnesium oxide (MAG-OX) 400 MG tablet Take 1 tablet by mouth daily. 02/29/20  Yes [provider]  methotrexate 50 MG/2ML injection Inject 12.5 mg into the vein every Thursday. (0.5 ml)   Yes [provider]  nystatin cream (MYCOSTATIN) Apply 1 application topically 2 (two) times daily. 02/08/20 02/07/21 Yes [provider]  omeprazole (PRILOSEC) 20 MG capsule Take 20 mg by mouth daily before breakfast.    Yes [provider]  oxyCODONE (OXY IR/ROXICODONE) 5 MG immediate release tablet Take 1-2 tablets (5-10 mg total) by mouth every 4 (four) hours as needed for moderate pain (pain score  4-6). 09/23/19  Yes Lattie Corns, PA-C  tiZANidine (ZANAFLEX) 4 MG capsule Take 4 mg by mouth 2 (two) times daily as needed for muscle spasms.   Yes [provider]  traMADol (ULTRAM) 50 MG tablet Take 1 tablet (50 mg total) by mouth every 6 (six) hours as needed for moderate pain. 09/23/19  Yes Lattie Corns, PA-C    Inpatient Medications: Scheduled Meds: . aspirin EC  81 mg Oral Daily  . chlorthalidone  12.5 mg Oral Daily  . losartan  100 mg Oral Daily   Continuous Infusions: . heparin 1,000 Units/hr (03/09/20 0709)   PRN Meds:   Allergies:   Allergies  Allergen Reactions  . Penicillins Hives    Social History:   Social History   Socioeconomic History  . Marital status: Widowed    Spouse name: Not on file  . Number of children:  Not on file  . Years of education: Not on file  . Highest education level: Not on file  Occupational History  . Occupation: case Freight forwarder for [employer]    Comment: RETIRED  Tobacco Use  . Smoking status: Never Smoker  . Smokeless tobacco: Never Used  Vaping Use  . Vaping Use: Never used  Substance and Sexual Activity  . Alcohol use: No  . Drug use: No  . Sexual activity: Not on file  Other Topics Concern  . Not on file  Social History Narrative   Patient lives alone. Not sure if anyone will be able to stay with her after surgery.     She may look into going to a rehab facility if needed.   Social Determinants of Health   Financial Resource Strain:   . Difficulty of Paying Living Expenses:   Food Insecurity:   . Worried About Charity fundraiser in the Last Year:   . Arboriculturist in the Last Year:   Transportation Needs:   . Film/video editor (Medical):   Marland Kitchen Lack of Transportation (Non-Medical):   Physical Activity:   . Days of Exercise per Week:   . Minutes of Exercise per Session:   Stress:   . Feeling of Stress :   Social Connections:   . Frequency of Communication with Friends and Family:   .  Frequency of Social Gatherings with Friends and Family:   . Attends Religious Services:   . Active Member of Clubs or Organizations:   . Attends Archivist Meetings:   Marland Kitchen Marital Status:   Intimate Partner Violence:   . Fear of Current or Ex-Partner:   . Emotionally Abused:   Marland Kitchen Physically Abused:   . Sexually Abused:      Family History:   Family History  Problem Relation Age of Onset  . Breast cancer Other 50  . Hypertension Mother   . Migraines Mother   . Other Father     ROS:  Review of Systems  Constitutional: Positive for diaphoresis and malaise/fatigue. Negative for chills, fever and weight loss.  HENT: Positive for tinnitus. Negative for congestion, sinus pain and sore throat.   Eyes: Negative for discharge and redness.  Respiratory: Negative for cough, sputum production, shortness of breath and wheezing.   Cardiovascular: Positive for chest pain. Negative for palpitations, orthopnea, claudication, leg swelling and PND.  Gastrointestinal: Negative for abdominal pain, blood in stool, constipation, diarrhea, heartburn, melena, nausea and vomiting.  Musculoskeletal: Negative for falls and myalgias.  Skin: Negative for rash.  Neurological: Positive for dizziness and weakness. Negative for tingling, tremors, sensory change, speech change, focal weakness, loss of consciousness and headaches.  Endo/Heme/Allergies: Does not bruise/bleed easily.  Psychiatric/Behavioral: Negative for substance abuse. The patient is not nervous/anxious.   All other systems reviewed and are negative.     Physical Exam/Data:   Vitals:   03/09/20 0500 03/09/20 0600 03/09/20 0700 03/09/20 0800  BP: 114/60 (!) 112/58 113/65 113/64  Pulse: 80 80 83 89  Resp: 20 20 16 14   Temp:      TempSrc:      SpO2: 98% 100% 98% 98%  Weight:       No intake or output data in the 24 hours ending 03/09/20 0942 Filed Weights   03/08/20 2117  Weight: 108.9 kg   Body mass index is 39.94 kg/m.    Physical Exam: General: Well developed, well nourished, in no acute distress. Head: Normocephalic, atraumatic,  sclera non-icteric, no xanthomas, nares without discharge.  Neck: Negative for carotid bruits. JVD not elevated. Lungs: Clear bilaterally to auscultation without wheezes, rales, or rhonchi. Breathing is unlabored. Heart: RRR with S1 S2. No murmurs, rubs, or gallops appreciated. Abdomen: Soft, non-tender, non-distended with normoactive bowel sounds. No hepatomegaly. No rebound/guarding. No obvious abdominal masses. Msk:  Strength and tone appear normal for age. Extremities: No clubbing or cyanosis. No edema. Distal pedal pulses are 2+ and equal bilaterally. Neuro: Alert and oriented X 3. No facial asymmetry. No focal deficit. Moves all extremities spontaneously. Psych:  Responds to questions appropriately with a normal affect.   EKG:  The EKG was personally reviewed and demonstrates: NSR, 94 bpm, prolonged QT, T wave inversion along inferior and anterolateral leads which is new when compared to prior studies Telemetry:  Telemetry was personally reviewed and demonstrates: SR  Weights: [employer]   03/08/20 2117  Weight: 108.9 kg    Relevant CV Studies:  2D echo pending  Laboratory Data:  Chemistry Recent Labs  Lab 03/08/20 1957 03/09/20 0557  NA 141 141  K 4.0 3.7  CL 105 107  CO2 21* 21*  GLUCOSE 91 99  BUN 38* 40*  CREATININE 1.77* 1.55*  CALCIUM 9.4 9.1  GFRNONAA 28* 33*  GFRAA 33* 39*  ANIONGAP 15 13    No results for input(s): PROT, ALBUMIN, AST, ALT, ALKPHOS, BILITOT in the last 168 hours. Hematology Recent Labs  Lab 03/08/20 1957 03/09/20 0557  WBC 8.0 7.9  RBC 4.50 3.98  HGB 12.8 11.4*  HCT 41.0 33.9*  MCV 91.1 85.2  MCH 28.4 28.6  MCHC 31.2 33.6  RDW 14.9 14.7  PLT 114* 100*   Cardiac EnzymesNo results for input(s): TROPONINI in the last 168 hours. No results for input(s): TROPIPOC in the last 168 hours.  BNPNo results for input(s):  BNP, PROBNP in the last 168 hours.  DDimer No results for input(s): DDIMER in the last 168 hours.  Radiology/Studies:  DG Chest 2 View  Result Date: 03/08/2020 IMPRESSION: No active cardiopulmonary disease. Electronically Signed   By: Fidela Salisbury MD   On: 03/08/2020 20:29    Assessment and Plan:   1.  Chest pain/elevated troponin/abnormal EKG: -Minimally elevated flat trending troponin not confirmatory for ACS -EKG with new inferior and anterolateral T wave inversion, will repeat -Reasonable to continue heparin drip for total of 48 hours -Patient will require ischemic evaluation though would like to wait for echo results prior to deciding if she would like to proceed with diagnostic LHC versus Lexiscan MPI -ASA -She remains n.p.o. at this time, if diagnostic cath is deferred until 8/13 she would be able to eat today.  If she would like to move forward with Lexiscan MPI this would not occur until 8/13 given need to order dose  2.  Dizziness/generalized malaise/fatigue: -Not consistent with vertigo -She was noted to have a prolonged QT on EKG which will be repeated -Magnesium 1.7 in the ED, she has received IV magnesium sulfate -Replete potassium from 3.7 to 4.0 -Avoid QT prolonging medications -Check echo as above -Consider carotid artery ultrasound -Monitor on telemetry  3.  CKD stage III: -Stable  4.  HTN: -Blood pressure well controlled  5.  HLD: -LDL 121 this admission -If the above ischemic evaluation is abnormal recommend addition of statin therapy prior to discharge   For questions or updates, please contact [place] Please consult [email or web address] for contact info under Cardiology/STEMI.   Signed, Christell Faith, PA-C [facility] Pager: [phone] 03/09/2020, 9:42 AM

## 2020-03-09 NOTE — Progress Notes (Signed)
*  PRELIMINARY RESULTS* Echocardiogram 2D Echocardiogram has been performed.  Sabrina Green 03/09/2020, 12:27 PM

## 2020-03-10 ENCOUNTER — Inpatient Hospital Stay: Payer: Medicare Other

## 2020-03-10 ENCOUNTER — Other Ambulatory Visit (INDEPENDENT_AMBULATORY_CARE_PROVIDER_SITE_OTHER): Payer: Self-pay | Admitting: Vascular Surgery

## 2020-03-10 ENCOUNTER — Encounter
Admission: EM | Disposition: A | Discharge status: Home / Self Care | Payer: Self-pay | Source: Home / Self Care | Attending: Internal Medicine

## 2020-03-10 DIAGNOSIS — I2602 Saddle embolus of pulmonary artery with acute cor pulmonale: Secondary | ICD-10-CM

## 2020-03-10 DIAGNOSIS — I2699 Other pulmonary embolism without acute cor pulmonale: Secondary | ICD-10-CM | POA: Diagnosis present

## 2020-03-10 DIAGNOSIS — I2609 Other pulmonary embolism with acute cor pulmonale: Secondary | ICD-10-CM | POA: Diagnosis present

## 2020-03-10 DIAGNOSIS — R0602 Shortness of breath: Secondary | ICD-10-CM

## 2020-03-10 HISTORY — PX: PULMONARY THROMBECTOMY: CATH118295

## 2020-03-10 LAB — CBC
HCT: 32.6 % — ABNORMAL LOW (ref 36.0–46.0)
HCT: 34.7 % — ABNORMAL LOW (ref 36.0–46.0)
Hemoglobin: 11 g/dL — ABNORMAL LOW (ref 12.0–15.0)
Hemoglobin: 11 g/dL — ABNORMAL LOW (ref 12.0–15.0)
MCH: 29.1 pg (ref 26.0–34.0)
MCH: 28.9 pg (ref 26.0–34.0)
MCHC: 33.7 g/dL (ref 30.0–36.0)
MCHC: 31.7 g/dL (ref 30.0–36.0)
MCV: 86.2 fL (ref 80.0–100.0)
MCV: 91.3 fL (ref 80.0–100.0)
Platelets: 125 10*3/uL — ABNORMAL LOW (ref 150–400)
Platelets: 122 10*3/uL — ABNORMAL LOW (ref 150–400)
RBC: 3.78 MIL/uL — ABNORMAL LOW (ref 3.87–5.11)
RBC: 3.8 MIL/uL — ABNORMAL LOW (ref 3.87–5.11)
RDW: 14.9 % (ref 11.5–15.5)
RDW: 15.6 % — ABNORMAL HIGH (ref 11.5–15.5)
WBC: 6.8 10*3/uL (ref 4.0–10.5)
WBC: 8.4 10*3/uL (ref 4.0–10.5)
nRBC: 0.4 % — ABNORMAL HIGH (ref 0.0–0.2)
nRBC: 0.4 % — ABNORMAL HIGH (ref 0.0–0.2)

## 2020-03-10 LAB — BASIC METABOLIC PANEL
Anion gap: 11 (ref 5–15)
BUN: 37 mg/dL — ABNORMAL HIGH (ref 8–23)
CO2: 22 mmol/L (ref 22–32)
Calcium: 8.7 mg/dL — ABNORMAL LOW (ref 8.9–10.3)
Chloride: 107 mmol/L (ref 98–111)
Creatinine, Ser: 1.82 mg/dL — ABNORMAL HIGH (ref 0.44–1.00)
GFR calc Af Amer: 32 mL/min — ABNORMAL LOW (ref >60)
GFR calc non Af Amer: 27 mL/min — ABNORMAL LOW (ref >60)
Glucose, Bld: 134 mg/dL — ABNORMAL HIGH (ref 70–99)
Potassium: 4 mmol/L (ref 3.5–5.1)
Sodium: 140 mmol/L (ref 135–145)

## 2020-03-10 LAB — HEPARIN LEVEL (UNFRACTIONATED): Heparin Unfractionated: 0.46 IU/mL (ref 0.30–0.70)

## 2020-03-10 SURGERY — PULMONARY THROMBECTOMY
Anesthesia: Moderate Sedation

## 2020-03-10 MED ORDER — ALTEPLASE 2 MG IJ SOLR
INTRAMUSCULAR | Status: DC | PRN
  Administered 2020-03-10: 8 mg

## 2020-03-10 MED ORDER — MIDAZOLAM HCL 2 MG/2ML IJ SOLN
INTRAMUSCULAR | Status: DC | PRN
  Administered 2020-03-10 (×2): 1 mg via INTRAVENOUS
  Administered 2020-03-10: 2 mg via INTRAVENOUS

## 2020-03-10 MED ORDER — MIDAZOLAM HCL 2 MG/ML PO SYRP: 8.0000 mg | ORAL_SOLUTION | Freq: Once | ORAL | Status: DC | PRN

## 2020-03-10 MED ORDER — HEPARIN SODIUM (PORCINE) 1000 UNIT/ML IJ SOLN
INTRAMUSCULAR | Status: DC | PRN
  Administered 2020-03-10: 3000 [IU] via INTRAVENOUS

## 2020-03-10 MED ORDER — CLINDAMYCIN PHOSPHATE 300 MG/50ML IV SOLN
300.0000 mg | Freq: Once | INTRAVENOUS | Status: AC
  Filled 2020-03-10: qty 50

## 2020-03-10 MED ORDER — HEPARIN SODIUM (PORCINE) 1000 UNIT/ML IJ SOLN
INTRAMUSCULAR | Status: AC
  Filled 2020-03-10: qty 1

## 2020-03-10 MED ORDER — MIDAZOLAM HCL 5 MG/5ML IJ SOLN
INTRAMUSCULAR | Status: AC
  Filled 2020-03-10: qty 5

## 2020-03-10 MED ORDER — METHYLPREDNISOLONE SODIUM SUCC 125 MG IJ SOLR: 125.0000 mg | Freq: Once | INTRAMUSCULAR | Status: DC | PRN

## 2020-03-10 MED ORDER — DIPHENHYDRAMINE HCL 50 MG/ML IJ SOLN: 50.0000 mg | Freq: Once | INTRAMUSCULAR | Status: DC | PRN

## 2020-03-10 MED ORDER — IODIXANOL 320 MG/ML IV SOLN
INTRAVENOUS | Status: DC | PRN
  Administered 2020-03-10: 50 mL via INTRAVENOUS

## 2020-03-10 MED ORDER — SODIUM CHLORIDE 0.9 % IV SOLN: INTRAVENOUS | Status: DC

## 2020-03-10 MED ORDER — FENTANYL CITRATE (PF) 100 MCG/2ML IJ SOLN
INTRAMUSCULAR | Status: AC
  Filled 2020-03-10: qty 2

## 2020-03-10 MED ORDER — ONDANSETRON HCL 4 MG/2ML IJ SOLN: 4.0000 mg | Freq: Four times a day (QID) | INTRAMUSCULAR | Status: DC | PRN

## 2020-03-10 MED ORDER — FAMOTIDINE 20 MG PO TABS: 40.0000 mg | ORAL_TABLET | Freq: Once | ORAL | Status: DC | PRN

## 2020-03-10 MED ORDER — CLINDAMYCIN PHOSPHATE 300 MG/50ML IV SOLN
INTRAVENOUS | Status: AC
  Administered 2020-03-10: 300 mg via INTRAVENOUS
  Filled 2020-03-10: qty 50

## 2020-03-10 MED ORDER — HYDROMORPHONE HCL 1 MG/ML IJ SOLN: 1.0000 mg | Freq: Once | INTRAMUSCULAR | Status: DC | PRN

## 2020-03-10 MED ORDER — FENTANYL CITRATE (PF) 100 MCG/2ML IJ SOLN
INTRAMUSCULAR | Status: DC | PRN
  Administered 2020-03-10: 50 ug via INTRAVENOUS
  Administered 2020-03-10 (×2): 25 ug via INTRAVENOUS

## 2020-03-10 SURGICAL SUPPLY — 16 items
CANISTER PENUMBRA ENGINE (MISCELLANEOUS) ×2 IMPLANT
CATH ANGIO 5F 100CM .035 PIG (CATHETERS) ×2 IMPLANT
CATH INDIGO 12 HTORQ 115 (CATHETERS) ×2 IMPLANT
CATH INDIGO 8 XTORQ TIP 115CM (CATHETERS) ×2 IMPLANT
CATH INDIGO SEP 12 (CATHETERS) ×2 IMPLANT
CATH INDIGO SEP 8 (CATHETERS) ×2 IMPLANT
CATH INFINITI JR4 5F (CATHETERS) ×2 IMPLANT
CATH SELECT BERN TIP 5F 130 (CATHETERS) ×2 IMPLANT
DEVICE TORQUE .025-.038 (MISCELLANEOUS) ×2 IMPLANT
GLIDEWIRE ADV .035X260CM (WIRE) ×2 IMPLANT
PACK ANGIOGRAPHY (CUSTOM PROCEDURE TRAY) ×3 IMPLANT
SHEATH BRITE TIP 6FRX11 (SHEATH) ×2 IMPLANT
SHEATH PINNACLE 11FRX10 (SHEATH) ×2 IMPLANT
TUBING CONTRAST HIGH PRESS 72 (TUBING) ×2 IMPLANT
WIRE J 3MM .035X145CM (WIRE) ×2 IMPLANT
WIRE MAGIC TORQUE 260C (WIRE) ×2 IMPLANT

## 2020-03-10 NOTE — Progress Notes (Addendum)
1905 Pt resting in bed with call bell in reach and daughter at bedside. NADN Right groin soft, tender, guaze clean, dry, intact. No C/O. Pt laying flat. Pt BP at 1700 96/51 HR 63 Resp 18 RA. Pt C/O "I don't feel good". Rhonchi Bilateral lobes. Pt reports coughing up bloody sputum. MD aware from earlier shift per RN report.  2000 BP 94/48 HR 62 Resp 25 RA patients oxygen SATs dropped to 82% while in room, Placed 2L [place] pt SATs went to 88 unable to maintain, increased to 5L unable to maintain SATs greater than 88%. Placed pt on Venturi mask at 5L. SATs at 100%. Charge RN Aware  2045 MD notified, orders received. STAT CXR, CBC, Start NS @ 75 that are .ordered at [place] now. Per MD hold Heparin Gtt @ 10 until further notice.   2140 BP 80/62 HR 68 Resp 20 on Venturi mask @ 5L, 100% oxygen SAT MD in room and aware.   2241 BP 120/45 HR 64 Resp 20 Venturi Mask @ 5L, 100% Oxygen SATs. MD Aware  2300 Per MD order Hold Heparin @ 10 and Stop NS @ 75 until a.m. for further.  evaluation.  Agricultural consultant notified.

## 2020-03-10 NOTE — Plan of Care (Signed)

## 2020-03-10 NOTE — H&P (View-Only) (Signed)
[facility] Vascular Consult Note  MRN : [id number]  Sabrina Green is a 74 y.o. ([birth year]) female who presents with chief complaint of  Chief Complaint  Patient presents with   Shortness of Breath   Weakness   History of Present Illness:  The patient is a 74 year old female with history of hypertension, asthma, IBS, [place], rheumatoid arthritis on methotrexate and abatacept, chronic pain syndrome with arthritis presented to the hospital with fatigue and increasing shortness of breath ongoing for more than one month.  Patient endorses a history of progressively worsening shortness of breath.  She notes the dyspnea has been present for approximately one month.   Patient also with chest pain.  Patient denies any recent trauma, immobility or known clotting/bleeding disorder.  Currently, patient is mildly dyspneic at rest however becomes profoundly short of breath with ambulation.  In the emergency room afebrile.  Troponin 600 and.  EKG shows new inverted T waves on anterior leads.  Chronic thrombocytopenia. Creatinine 1.77 which is stable.  Increased QT interval.  Started on heparin for non-STEMI and admitted to hospital.  CTA Chest (03/09/20): Positive for acute PE with of right heart strain (RV/LV Ratio = 1.5) consistent with at least submassive (intermediate risk) PE. The presence of right heart strain has been associated with an increased risk of morbidity and mortality.  Vascular surgery was consulted by Dr. Sloan Leiter for possible endovascular intervention.  Current Facility-Administered Medications  Medication Dose Route Frequency Provider Last Rate Last Admin   [START ON 03/11/2020] 0.9 %  sodium chloride infusion   Intravenous Continuous Reshard Guillet A, PA-C       allopurinol (ZYLOPRIM) tablet 300 mg  300 mg Oral Daily Barb Merino, MD   300 mg at 03/09/20 1159   aspirin EC tablet 81 mg  81 mg Oral Daily Barb Merino, MD   81 mg at 03/10/20  0160   busPIRone (BUSPAR) tablet 5 mg  5 mg Oral QID PRN Barb Merino, MD       chlorthalidone (HYGROTON) tablet 12.5 mg  12.5 mg Oral Daily Tu, Ching T, DO   12.5 mg at 10/93/23 5573   folic acid (FOLVITE) tablet 0.5 mg  500 mcg Oral Daily Barb Merino, MD   0.5 mg at 03/10/20 2202   gabapentin (NEURONTIN) capsule 300 mg  300 mg Oral QID Barb Merino, MD   300 mg at 03/10/20 0908   heparin ADULT infusion 100 units/mL (25000 units/260mL sodium chloride 0.45%)  1,000 Units/hr Intravenous Continuous Barb Merino, MD 10 mL/hr at 03/09/20 1810 1,000 Units/hr at 03/09/20 1810   losartan (COZAAR) tablet 100 mg  100 mg Oral Daily Tu, Ching T, DO   100 mg at 03/10/20 5427   oxyCODONE (Oxy IR/ROXICODONE) immediate release tablet 5-10 mg  5-10 mg Oral Q4H PRN Barb Merino, MD   5 mg at 03/09/20 1430   pantoprazole (PROTONIX) EC tablet 40 mg  40 mg Oral Daily Barb Merino, MD   40 mg at 03/10/20 0908   tiZANidine (ZANAFLEX) tablet 4 mg  4 mg Oral BID PRN Barb Merino, MD       Past Medical History:  Diagnosis Date   Anemia    vitamin b and d deficiency   Anxiety    Arthritis    osteoarthrits   Chronic pain syndrome    CKD (chronic kidney disease) stage 3, GFR 06-23 ml/min    Complication of anesthesia    difficulty waking up   DDD (degenerative disc  disease), cervical    Fibromyalgia    not accurate, per patient   GERD (gastroesophageal reflux disease)    Gout    Hearing loss    History of kidney stones 1987   Hypertension    Rheumatoid arthritis ([place])    orencia infusions   Vitamin B12 deficiency    Vitamin D deficiency    Past Surgical History:  Procedure Laterality Date   ABDOMINAL HYSTERECTOMY     ABDOMINAL HYSTERECTOMY     ANKLE FRACTURE SURGERY Right 1989   [place], [place].   METAL IN ANKLE   BREAST BIOPSY Right 02/09/2019   affirm bx of calcs, ribbon marker, path pending   CESAREAN SECTION     CHOLECYSTECTOMY      EYE SURGERY Bilateral 2015   cataract surgery, [place], [place]   KNEE ARTHROSCOPY     TONSILLECTOMY     TOTAL KNEE ARTHROPLASTY Left 09/21/2019   Procedure: TOTAL KNEE ARTHROPLASTY;  Surgeon: Corky Mull, MD;  Location: [facility] ORS;  Service: Orthopedics;  Laterality: Left;   TUBAL LIGATION     Social History Social History   Tobacco Use   Smoking status: Never Smoker   Smokeless tobacco: Never Used  Scientific laboratory technician Use: Never used  Substance Use Topics   Alcohol use: No   Drug use: No   Family History Family History  Problem Relation Age of Onset   Breast cancer Other 75   Hypertension Mother    Migraines Mother    Other Father   Denies family history of peripheral artery disease, venous disease or renal disease.  Allergies  Allergen Reactions   Penicillins Hives   REVIEW OF SYSTEMS (Negative unless checked)  Constitutional: [] Weight loss  [] Fever  [] Chills Cardiac: [x] Chest pain   [x] Chest pressure   [] Palpitations   [] Shortness of breath when laying flat   [x] Shortness of breath at rest   [x] Shortness of breath with exertion. Vascular:  [] Pain in legs with walking   [] Pain in legs at rest   [] Pain in legs when laying flat   [] Claudication   [] Pain in feet when walking  [] Pain in feet at rest  [] Pain in feet when laying flat   [] History of DVT   [] Phlebitis   [] Swelling in legs   [] Varicose veins   [] Non-healing ulcers Pulmonary:   [] Uses home oxygen   [] Productive cough   [] Hemoptysis   [] Wheeze  [] COPD   [] Asthma Neurologic:  [] Dizziness  [] Blackouts   [] Seizures   [] History of stroke   [] History of TIA  [] Aphasia   [] Temporary blindness   [] Dysphagia   [] Weakness or numbness in arms   [] Weakness or numbness in legs Musculoskeletal:  [] Arthritis   [] Joint swelling   [] Joint pain   [] Low back pain Hematologic:  [] Easy bruising  [] Easy bleeding   [] Hypercoagulable state   [] Anemic  [] Hepatitis Gastrointestinal:  [] Blood in stool   [] Vomiting blood   [] Gastroesophageal reflux/heartburn   [] Difficulty swallowing. Genitourinary:  [] Chronic kidney disease   [] Difficult urination  [] Frequent urination  [] Burning with urination   [] Blood in urine Skin:  [] Rashes   [] Ulcers   [] Wounds Psychological:  [] History of anxiety   []  History of major depression.  Physical Examination  Vitals:   03/09/20 1557 03/09/20 2115 03/10/20 0218 03/10/20 0723  BP: 117/71 112/60 (!) 106/54 (!) 139/59  Pulse: 97 96 89 83  Resp: 18 20 20 17   Temp: 98.2 F (36.8 C) 98 F (36.7 C) 98.2  F (36.8 C) 98.5 F (36.9 C)  TempSrc: Oral Oral Oral Oral  SpO2: 100% 100% 97% 99%  Weight: 107 kg     Height: 5\' 5"  (1.651 m)      Body mass index is 39.26 kg/m. Gen:  WD/WN, NAD Head: [place]/AT, No temporalis wasting. Prominent temp pulse not noted. Ear/Nose/Throat: Hearing grossly intact, nares w/o erythema or drainage, oropharynx w/o Erythema/Exudate Eyes: Sclera non-icteric, conjunctiva clear Neck: Trachea midline.  No JVD.  Pulmonary:  Good air movement, respirations slightly labored, equal bilaterally.  Cardiac: RRR, normal S1, S2. Vascular:  Vessel Right Left  Radial Palpable Palpable  Ulnar Palpable Palpable  Brachial Palpable Palpable  Carotid Palpable, without bruit Palpable, without bruit  Aorta Not palpable N/A  Femoral Palpable Palpable  Popliteal Palpable Palpable  PT Palpable Palpable  DP Palpable Palpable   Gastrointestinal: soft, non-tender/non-distended. No guarding/reflex.  Musculoskeletal: M/S 5/5 throughout.  Extremities without ischemic changes.  No deformity or atrophy. No edema. Neurologic: Sensation grossly intact in extremities.  Symmetrical.  Speech is fluent. Motor exam as listed above. Psychiatric: Judgment intact, Mood & affect appropriate for pt's clinical situation. Dermatologic: No rashes or ulcers noted.  No cellulitis or open wounds. Lymph : No Cervical, Axillary, or Inguinal lymphadenopathy.  CBC Lab Results  Component Value  Date   WBC 6.8 03/10/2020   HGB 11.0 (L) 03/10/2020   HCT 32.6 (L) 03/10/2020   MCV 86.2 03/10/2020   PLT 125 (L) 03/10/2020   BMET    Component Value Date/Time   NA 140 03/10/2020 0104   NA 146 (H) 12/11/2017 1109   K 4.0 03/10/2020 0104   CL 107 03/10/2020 0104   CO2 22 03/10/2020 0104   GLUCOSE 134 (H) 03/10/2020 0104   BUN 37 (H) 03/10/2020 0104   BUN 24 12/11/2017 1109   CREATININE 1.82 (H) 03/10/2020 0104   CALCIUM 8.7 (L) 03/10/2020 0104   GFRNONAA 27 (L) 03/10/2020 0104   GFRAA 32 (L) 03/10/2020 0104   Estimated Creatinine Clearance: 34.5 mL/min (A) (by C-G formula based on SCr of 1.82 mg/dL (H)).  COAG Lab Results  Component Value Date   INR 1.2 03/08/2020   INR  03/08/2020    ACCURACY OF RESULTS QUESTIONABLE. RECOMMEND RECOLLECT TO VERIFY.   INR 1.1 09/17/2019   Radiology DG Chest 2 View  Result Date: 03/08/2020 CLINICAL DATA:  Dyspnea EXAM: CHEST - 2 VIEW COMPARISON:  09/07/2017 FINDINGS: Lungs are well expanded, symmetric, and clear. No pneumothorax or pleural effusion. Cardiac size within normal limits. Pulmonary vascularity is normal. Osseous structures are age-appropriate. No acute bone abnormality. IMPRESSION: No active cardiopulmonary disease. Electronically Signed   By: Fidela Salisbury MD   On: 03/08/2020 20:29   CT ANGIO CHEST PE W OR WO CONTRAST  Result Date: 03/09/2020 CLINICAL DATA:  74 year old female with shortness of breath. Concern for pulmonary embolism. EXAM: CT ANGIOGRAPHY CHEST WITH CONTRAST TECHNIQUE: Multidetector CT imaging of the chest was performed using the standard protocol during bolus administration of intravenous contrast. Multiplanar CT image reconstructions and MIPs were obtained to evaluate the vascular anatomy. CONTRAST:  6mL OMNIPAQUE IOHEXOL 350 MG/ML SOLN COMPARISON:  Chest radiograph dated 03/08/2020. FINDINGS: Cardiovascular: There is mild dilatation of the right heart chamber. The right ventricular lumen measures 4.6 cm in  diameter and the left ventricular lumen is approximately 3 cm in diameter with a RV/LV ratio of 1.5. No pericardial effusion. There is mild atherosclerotic calcification of the thoracic aorta. No aneurysmal dilatation. Large bilateral pulmonary artery  emboli involving the central pulmonary arteries extending into the lobar branches. Mediastinum/Nodes: No hilar or mediastinal adenopathy. The esophagus and the thyroid gland are grossly unremarkable. No mediastinal fluid collection. Lungs/Pleura: The lungs are clear. There is no pleural effusion pneumothorax. The central airways are patent. Upper Abdomen: Cholecystectomy. Musculoskeletal: No chest wall abnormality. No acute or significant osseous findings. Review of the MIP images confirms the above findings. IMPRESSION: Positive for acute PE with of right heart strain (RV/LV Ratio = 1.5) consistent with at least submassive (intermediate risk) PE. The presence of right heart strain has been associated with an increased risk of morbidity and mortality. These results were called by telephone at the time of interpretation on 03/09/2020 at 5:42 pm to provider Dr. Sloan Leiter, who verbally acknowledged these results. Electronically Signed   By: Anner Crete M.D.   On: 03/09/2020 17:56   ECHOCARDIOGRAM COMPLETE  Result Date: 03/09/2020    ECHOCARDIOGRAM REPORT   Patient Name:   [facility] Date of Exam: 03/09/2020 Medical Rec #:  [id number]        Height:       65.0 in Accession #:    [id number]       Weight:       240.0 lb Date of Birth:  [birth year]       BSA:          2.138 m Patient Age:    73 years         BP:           113/64 mmHg Patient Gender: F                HR:           86 bpm. Exam Location:  [facility] Procedure: 2D Echo, Color Doppler, Cardiac Doppler and Intracardiac            Opacification Agent Indications:     I24.9 Acute Coronary Syndrome  History:         Patient has no prior history of Echocardiogram examinations.                  CKD; Risk  Factors:Hypertension. Asthma.  Sonographer:     Charmayne Sheer RDCS (AE) Referring Phys:  [place] Diagnosing Phys: Ida Rogue MD  Sonographer Comments: Suboptimal apical window. Image acquisition challenging due to patient body habitus. IMPRESSIONS  1. Left ventricular ejection fraction, by estimation, is 55 to 60%. The left ventricle has normal function. The left ventricle has no regional wall motion abnormalities. Left ventricular diastolic parameters are consistent with Grade I diastolic dysfunction (impaired relaxation).  2. Right ventricular systolic function is mildly reduced. The right ventricular size is severely enlarged. There is severely elevated pulmonary artery systolic pressure. The estimated right ventricular systolic pressure is 35.3 mmHg.  3. Right atrial size was moderately dilated. FINDINGS  Left Ventricle: Left ventricular ejection fraction, by estimation, is 55 to 60%. The left ventricle has normal function. The left ventricle has no regional wall motion abnormalities. Definity contrast agent was given IV to delineate the left ventricular  endocardial borders. The left ventricular internal cavity size was normal in size. There is no left ventricular hypertrophy. Left ventricular diastolic parameters are consistent with Grade I diastolic dysfunction (impaired relaxation). Right Ventricle: The right ventricular size is severely enlarged. No increase in right ventricular wall thickness. Right ventricular systolic function is mildly reduced. There is severely elevated pulmonary artery systolic pressure. The tricuspid regurgitant velocity is 3.68 m/s,  and with an assumed right atrial pressure of 10 mmHg, the estimated right ventricular systolic pressure is 40.9 mmHg. Left Atrium: Left atrial size was normal in size. Right Atrium: Right atrial size was moderately dilated. Pericardium: There is no evidence of pericardial effusion. Mitral Valve: The mitral valve is normal in structure.  Normal mobility of the mitral valve leaflets. No evidence of mitral valve regurgitation. No evidence of mitral valve stenosis. MV peak gradient, 3.6 mmHg. The mean mitral valve gradient is 1.0 mmHg. Tricuspid Valve: The tricuspid valve is normal in structure. Tricuspid valve regurgitation is mild . No evidence of tricuspid stenosis. Aortic Valve: The aortic valve is normal in structure. Aortic valve regurgitation is not visualized. Mild to moderate aortic valve sclerosis/calcification is present, without any evidence of aortic stenosis. Aortic valve mean gradient measures 3.0 mmHg. Aortic valve peak gradient measures 6.8 mmHg. Aortic valve area, by VTI measures 2.13 cm. Pulmonic Valve: The pulmonic valve was normal in structure. Pulmonic valve regurgitation is not visualized. No evidence of pulmonic stenosis. Aorta: The aortic root is normal in size and structure. Venous: The inferior vena cava is normal in size with greater than 50% respiratory variability, suggesting right atrial pressure of 3 mmHg. IAS/Shunts: No atrial level shunt detected by color flow Doppler.  LEFT VENTRICLE PLAX 2D LVIDd:         4.22 cm  Diastology LVIDs:         3.37 cm  LV e' lateral:   6.64 cm/s LV PW:         0.86 cm  LV E/e' lateral: 6.6 LV IVS:        0.84 cm  LV e' medial:    6.64 cm/s LVOT diam:     2.10 cm  LV E/e' medial:  6.6 LV SV:         46 LV SV Index:   22 LVOT Area:     3.46 cm  LEFT ATRIUM             Index LA diam:        2.20 cm 1.03 cm/m LA Vol (A2C):   28.6 ml 13.38 ml/m LA Vol (A4C):   50.4 ml 23.58 ml/m LA Biplane Vol: 39.3 ml 18.39 ml/m  AORTIC VALVE                   PULMONIC VALVE AV Area (Vmax):    2.33 cm    PV Vmax:       0.78 m/s AV Area (Vmean):   2.24 cm    PV Vmean:      56.800 cm/s AV Area (VTI):     2.13 cm    PV VTI:        0.140 m AV Vmax:           130.00 cm/s PV Peak grad:  2.4 mmHg AV Vmean:          84.000 cm/s PV Mean grad:  1.0 mmHg AV VTI:            0.218 m AV Peak Grad:      6.8 mmHg  AV Mean Grad:      3.0 mmHg LVOT Vmax:         87.50 cm/s LVOT Vmean:        54.400 cm/s LVOT VTI:          0.134 m LVOT/AV VTI ratio: 0.61  AORTA Ao Root diam: 2.80 cm MITRAL VALVE  TRICUSPID VALVE MV Area (PHT): 4.49 cm    TR Peak grad:   54.2 mmHg MV Peak grad:  3.6 mmHg    TR Vmax:        368.00 cm/s MV Mean grad:  1.0 mmHg MV Vmax:       0.95 m/s    SHUNTS MV Vmean:      52.1 cm/s   Systemic VTI:  0.13 m MV Decel Time: 169 msec    Systemic Diam: 2.10 cm MV E velocity: 43.70 cm/s MV A velocity: 66.80 cm/s MV E/A ratio:  0.65 Ida Rogue MD Electronically signed by Ida Rogue MD Signature Date/Time: 03/09/2020/6:32:24 PM    Final    Assessment/Plan The patient is a 74 year old female who presented to the [facility] emergency department with progressively worsening shortness of breath and chest pain found to have submassive bilateral pulmonary embolism with right heart strain  1.  Submassive pulmonary embolism with right heart strain: Patient with 1 month of progressively worsening shortness of breath and chest pain.  Found to have submassive bilateral pulmonary embolism with right heart strain on CTA.  Is experiencing shortness of breath at rest however this significantly worsens with ambulation.  In the setting of such a large clot burden, with right heart strain, and the severity of symptoms recommend the patient undergo an endovascular pulmonary thrombectomy and thrombolysis.  Procedure, risks and benefit explained to the patient and family member who was at bedside.  All questions were answered.  The patient would like to proceed.  Continue heparin.  We will plan on procedure this afternoon with Dr. Lucky Cowboy  2.  Lower extremity DVT: Venous duplex currently pending  3.  Chronic kidney disease: She is aware that this procedure does use contrast. She is aware that contrast can affect kidney function. We will try to limit the amount of contrast to preserve  kidney function Started normal saline at 75 cc/hr  Discussed with Dr. Mayme Genta, PA-C  03/10/2020 10:46 AM  This note was created with Dragon medical transcription system.  Any error is purely unintentional

## 2020-03-10 NOTE — Care Management Important Message (Signed)
Important Message  Patient Details  Name: Sabrina Green MRN: [id number] Date of Birth: 1946/04/03   Medicare Important Message Given:  Yes  Initial Medicare IM given by Patient Access Associate on 03/09/2020 at 10:25am.    Dannette Barbara 03/10/2020, 8:45 AM

## 2020-03-10 NOTE — Progress Notes (Signed)
Found patient on NRB mask at 5 liters. Placed patient on 4 liter nasal cannula.. O2 saturation noted at 99-100% on cannula. Instructed patient on IS. Patient able to perform without difficulty

## 2020-03-10 NOTE — Progress Notes (Signed)
Pt back from specials after thrombectomy, pt coughing up blood and bleeding from right groin site. held pressure for 5 min and bleeding stopped. Dr. Lucky Cowboy notified and advised to pause heparin for 2 hours. Pharmacy notified. Will continue to monitor closely.

## 2020-03-10 NOTE — Progress Notes (Signed)
Cross Cover Brief Note Patient continue with emoptysis  And developed decreased oxygen saturations. Required 50% venti to improve. She denied any resp distress and at bedside is quite comfortable and able to speak in full sentences. She states she just has felt so weak ( one of presenting symptoms) S/p thrombectomy and cath directed TPA for submassive TPA due to restart heparin Chest xray with new ground glass opacieies possible r/t pna, hemorrhage or atypical edema Discussed with radiologist. Chest Ct ordered to further assess. Heparin restart on hold Discussed with Dr. Lucky Cowboy from vascular surgery. Advises to hold heparin till AM  CBC pending. Patient remaions stable

## 2020-03-10 NOTE — Progress Notes (Signed)
ANTICOAGULATION CONSULT NOTE  Pharmacy Consult for heparin Indication: chest pain/ACS  Patient Measurements: Height: 5\' 5"  (165.1 cm) Weight: 107 kg (235 lb 14.4 oz) IBW/kg (Calculated) : 57 Heparin Dosing Weight: 77 kg  Labs: Recent Labs    03/08/20 1957 03/08/20 2117 03/08/20 2135 03/08/20 2328 03/08/20 2328 03/09/20 0214 03/09/20 0557 03/09/20 1551 03/10/20 0104  HGB 12.8  --   --   --    < >  --  11.4*  --  11.0*  HCT 41.0  --   --   --   --   --  33.9*  --  32.6*  PLT 114*  --   --   --   --   --  100*  --  125*  APTT  --   --  ACCURACY OF RESULTS QUESTIONABLE. RECOMMEND RECOLLECT TO VERIFY. >160*  --   --   --   --   --   LABPROT  --   --  ACCURACY OF RESULTS QUESTIONABLE. RECOMMEND RECOLLECT TO VERIFY. 14.6  --   --   --   --   --   INR  --   --  ACCURACY OF RESULTS QUESTIONABLE. RECOMMEND RECOLLECT TO VERIFY. 1.2  --   --   --   --   --   HEPARINUNFRC  --   --   --  0.74*   < >  --  0.69 0.63 0.46  CREATININE 1.77*  --   --   --   --   --  1.55*  --  1.82*  TROPONINIHS 615*   < >  --  714*  --  593* 515*  --   --    < > = values in this interval not displayed.    Estimated Creatinine Clearance: 34.5 mL/min (A) (by C-G formula based on SCr of 1.82 mg/dL (H)).   Medical History: Past Medical History:  Diagnosis Date  . Anemia    vitamin b and d deficiency  . Anxiety   . Arthritis    osteoarthrits  . Chronic pain syndrome   . CKD (chronic kidney disease) stage 3, GFR 30-59 ml/min   . Complication of anesthesia    difficulty waking up  . DDD (degenerative disc disease), cervical   . Fibromyalgia    not accurate, per patient  . GERD (gastroesophageal reflux disease)   . Gout   . Hearing loss   . History of kidney stones 1987  . Hypertension   . Rheumatoid arthritis (H[facility])    orencia infusions  . Vitamin B12 deficiency   . Vitamin D deficiency     Medications:  Scheduled:  . allopurinol  300 mg Oral Daily  . aspirin EC  81 mg Oral Daily  .  chlorthalidone  12.5 mg Oral Daily  . folic acid  188 mcg Oral Daily  . gabapentin  300 mg Oral QID  . losartan  100 mg Oral Daily  . pantoprazole  40 mg Oral Daily    Assessment: Patient w/ h/o CKD, RA s/p knee replacement on 08/2019 arrives w/ c/o SOB and weakness, CXR clear, initial trop 615, EKG showing QTc prolongation of 527 w/ ST abnormalities in III, aVF, V3, patient was on lovenox subq PTA for knee replacement; however, patient states she has not taken this since march of 2021 so no other anticoagulation PTA recently. Baseline CBC WNL. Baseline aPTT/anti-Xa/INR drawn after start of infusion. Patient is being started on heparin drip for management  of NSTEMI.  Goal of Therapy:  Heparin level 0.3-0.7 units/ml Monitor platelets by anticoagulation protocol: Yes   Plan:  08/13 @ 0100 HL 0.46 therapeutic. Will continue current rate and will recheck HL w/ am labs, CBC trending down will continue to monitor.  Tobie Lords, PharmD, BCPS Clinical Pharmacist 03/10/2020,3:02 AM

## 2020-03-10 NOTE — Interval H&P Note (Signed)
History and Physical Interval Note:  03/10/2020 3:04 PM  Sabrina Green  has presented today for surgery, with the diagnosis of Pulmonary embolism.  The various methods of treatment have been discussed with the patient and family. After consideration of risks, benefits and other options for treatment, the patient has consented to  Procedure(s): PULMONARY THROMBECTOMY (N/A) as a surgical intervention.  The patient's history has been reviewed, patient examined, no change in status, stable for surgery.  I have reviewed the patient's chart and labs.  Questions were answered to the patient's satisfaction.     Leotis Pain

## 2020-03-10 NOTE — Consult Note (Signed)
[facility] Vascular Consult Note  MRN : [id number]  Sabrina Green is a 74 y.o. (01/27/1946) female who presents with chief complaint of  Chief Complaint  Patient presents with  . Shortness of Breath  . Weakness   History of Present Illness:  The patient is a 74 year old female with history of hypertension, asthma, IBS, Nash, rheumatoid arthritis on methotrexate and abatacept, chronic pain syndrome with arthritis presented to the hospital with fatigue and increasing shortness of breath ongoing for more than one month.  Patient endorses a history of progressively worsening shortness of breath.  She notes the dyspnea has been present for approximately one month.   Patient also with chest pain.  Patient denies any recent trauma, immobility or known clotting/bleeding disorder.  Currently, patient is mildly dyspneic at rest however becomes profoundly short of breath with ambulation.  In the emergency room afebrile.  Troponin 600 and.  EKG shows new inverted T waves on anterior leads.  Chronic thrombocytopenia. Creatinine 1.77 which is stable.  Increased QT interval.  Started on heparin for non-STEMI and admitted to hospital.  CTA Chest (03/09/20): Positive for acute PE with of right heart strain (RV/LV Ratio = 1.5) consistent with at least submassive (intermediate risk) PE. The presence of right heart strain has been associated with an increased risk of morbidity and mortality.  Vascular surgery was consulted by Dr. Sloan Leiter for possible endovascular intervention.  Current Facility-Administered Medications  Medication Dose Route Frequency Provider Last Rate Last Admin  . [START ON 03/11/2020] 0.9 %  sodium chloride infusion   Intravenous Continuous Azariah Latendresse A, PA-C      . allopurinol (ZYLOPRIM) tablet 300 mg  300 mg Oral Daily Barb Merino, MD   300 mg at 03/09/20 1159  . aspirin EC tablet 81 mg  81 mg Oral Daily Barb Merino, MD   81 mg at 03/10/20  0907  . busPIRone (BUSPAR) tablet 5 mg  5 mg Oral QID PRN Barb Merino, MD      . chlorthalidone (HYGROTON) tablet 12.5 mg  12.5 mg Oral Daily Tu, Ching T, DO   12.5 mg at 03/10/20 0909  . folic acid (FOLVITE) tablet 0.5 mg  500 mcg Oral Daily Barb Merino, MD   0.5 mg at 03/10/20 0907  . gabapentin (NEURONTIN) capsule 300 mg  300 mg Oral QID Barb Merino, MD   300 mg at 03/10/20 0908  . heparin ADULT infusion 100 units/mL (25000 units/275mL sodium chloride 0.45%)  1,000 Units/hr Intravenous Continuous Barb Merino, MD 10 mL/hr at 03/09/20 1810 1,000 Units/hr at 03/09/20 1810  . losartan (COZAAR) tablet 100 mg  100 mg Oral Daily Tu, Ching T, DO   100 mg at 03/10/20 0908  . oxyCODONE (Oxy IR/ROXICODONE) immediate release tablet 5-10 mg  5-10 mg Oral Q4H PRN Barb Merino, MD   5 mg at 03/09/20 1430  . pantoprazole (PROTONIX) EC tablet 40 mg  40 mg Oral Daily Barb Merino, MD   40 mg at 03/10/20 0908  . tiZANidine (ZANAFLEX) tablet 4 mg  4 mg Oral BID PRN Barb Merino, MD       Past Medical History:  Diagnosis Date  . Anemia    vitamin b and d deficiency  . Anxiety   . Arthritis    osteoarthrits  . Chronic pain syndrome   . CKD (chronic kidney disease) stage 3, GFR 30-59 ml/min   . Complication of anesthesia    difficulty waking up  . DDD (degenerative disc  disease), cervical   . Fibromyalgia    not accurate, per patient  . GERD (gastroesophageal reflux disease)   . Gout   . Hearing loss   . History of kidney stones 1987  . Hypertension   . Rheumatoid arthritis (HCC)    orencia infusions  . Vitamin B12 deficiency   . Vitamin D deficiency    Past Surgical History:  Procedure Laterality Date  . ABDOMINAL HYSTERECTOMY    . ABDOMINAL HYSTERECTOMY    . [place], [place].   METAL IN ANKLE  . BREAST BIOPSY Right 02/09/2019   affirm bx of calcs, ribbon marker, path pending  . CESAREAN SECTION    . CHOLECYSTECTOMY     . EYE SURGERY Bilateral 2015   cataract surgery, [place], [place]  . KNEE ARTHROSCOPY    . TONSILLECTOMY    . TOTAL KNEE ARTHROPLASTY Left 09/21/2019   Procedure: TOTAL KNEE ARTHROPLASTY;  Surgeon: Corky Mull, MD;  Location: [facility] ORS;  Service: Orthopedics;  Laterality: Left;  . TUBAL LIGATION     Social History Social History   Tobacco Use  . Smoking status: Never Smoker  . Smokeless tobacco: Never Used  Vaping Use  . Vaping Use: Never used  Substance Use Topics  . Alcohol use: No  . Drug use: No   Family History Family History  Problem Relation Age of Onset  . Breast cancer Other 50  . Hypertension Mother   . Migraines Mother   . Other Father   Denies family history of peripheral artery disease, venous disease or renal disease.  Allergies  Allergen Reactions  . Penicillins Hives   REVIEW OF SYSTEMS (Negative unless checked)  Constitutional: [] Weight loss  [] Fever  [] Chills Cardiac: [x] Chest pain   [x] Chest pressure   [] Palpitations   [] Shortness of breath when laying flat   [x] Shortness of breath at rest   [x] Shortness of breath with exertion. Vascular:  [] Pain in legs with walking   [] Pain in legs at rest   [] Pain in legs when laying flat   [] Claudication   [] Pain in feet when walking  [] Pain in feet at rest  [] Pain in feet when laying flat   [] History of DVT   [] Phlebitis   [] Swelling in legs   [] Varicose veins   [] Non-healing ulcers Pulmonary:   [] Uses home oxygen   [] Productive cough   [] Hemoptysis   [] Wheeze  [] COPD   [] Asthma Neurologic:  [] Dizziness  [] Blackouts   [] Seizures   [] History of stroke   [] History of TIA  [] Aphasia   [] Temporary blindness   [] Dysphagia   [] Weakness or numbness in arms   [] Weakness or numbness in legs Musculoskeletal:  [] Arthritis   [] Joint swelling   [] Joint pain   [] Low back pain Hematologic:  [] Easy bruising  [] Easy bleeding   [] Hypercoagulable state   [] Anemic  [] Hepatitis Gastrointestinal:  [] Blood in stool   [] Vomiting blood   [] Gastroesophageal reflux/heartburn   [] Difficulty swallowing. Genitourinary:  [] Chronic kidney disease   [] Difficult urination  [] Frequent urination  [] Burning with urination   [] Blood in urine Skin:  [] Rashes   [] Ulcers   [] Wounds Psychological:  [] History of anxiety   []  History of major depression.  Physical Examination  Vitals:   03/09/20 1557 03/09/20 2115 03/10/20 0218 03/10/20 0723  BP: 117/71 112/60 (!) 106/54 (!) 139/59  Pulse: 97 96 89 83  Resp: 18 20 20 17   Temp: 98.2 F (36.8 C) 98 F (36.7 C) 98.2  F (36.8 C) 98.5 F (36.9 C)  TempSrc: Oral Oral Oral Oral  SpO2: 100% 100% 97% 99%  Weight: 107 kg     Height: 5\' 5"  (1.651 m)      Body mass index is 39.26 kg/m. Gen:  WD/WN, NAD Head: [place]/AT, No temporalis wasting. Prominent temp pulse not noted. Ear/Nose/Throat: Hearing grossly intact, nares w/o erythema or drainage, oropharynx w/o Erythema/Exudate Eyes: Sclera non-icteric, conjunctiva clear Neck: Trachea midline.  No JVD.  Pulmonary:  Good air movement, respirations slightly labored, equal bilaterally.  Cardiac: RRR, normal S1, S2. Vascular:  Vessel Right Left  Radial Palpable Palpable  Ulnar Palpable Palpable  Brachial Palpable Palpable  Carotid Palpable, without bruit Palpable, without bruit  Aorta Not palpable N/A  Femoral Palpable Palpable  Popliteal Palpable Palpable  PT Palpable Palpable  DP Palpable Palpable   Gastrointestinal: soft, non-tender/non-distended. No guarding/reflex.  Musculoskeletal: M/S 5/5 throughout.  Extremities without ischemic changes.  No deformity or atrophy. No edema. Neurologic: Sensation grossly intact in extremities.  Symmetrical.  Speech is fluent. Motor exam as listed above. Psychiatric: Judgment intact, Mood & affect appropriate for pt's clinical situation. Dermatologic: No rashes or ulcers noted.  No cellulitis or open wounds. Lymph : No Cervical, Axillary, or Inguinal lymphadenopathy.  CBC Lab Results  Component Value  Date   WBC 6.8 03/10/2020   HGB 11.0 (L) 03/10/2020   HCT 32.6 (L) 03/10/2020   MCV 86.2 03/10/2020   PLT 125 (L) 03/10/2020   BMET    Component Value Date/Time   NA 140 03/10/2020 0104   NA 146 (H) 12/11/2017 1109   K 4.0 03/10/2020 0104   CL 107 03/10/2020 0104   CO2 22 03/10/2020 0104   GLUCOSE 134 (H) 03/10/2020 0104   BUN 37 (H) 03/10/2020 0104   BUN 24 12/11/2017 1109   CREATININE 1.82 (H) 03/10/2020 0104   CALCIUM 8.7 (L) 03/10/2020 0104   GFRNONAA 27 (L) 03/10/2020 0104   GFRAA 32 (L) 03/10/2020 0104   Estimated Creatinine Clearance: 34.5 mL/min (A) (by C-G formula based on SCr of 1.82 mg/dL (H)).  COAG Lab Results  Component Value Date   INR 1.2 03/08/2020   INR  03/08/2020    ACCURACY OF RESULTS QUESTIONABLE. RECOMMEND RECOLLECT TO VERIFY.   INR 1.1 09/17/2019   Radiology DG Chest 2 View  Result Date: 03/08/2020 CLINICAL DATA:  Dyspnea EXAM: CHEST - 2 VIEW COMPARISON:  09/07/2017 FINDINGS: Lungs are well expanded, symmetric, and clear. No pneumothorax or pleural effusion. Cardiac size within normal limits. Pulmonary vascularity is normal. Osseous structures are age-appropriate. No acute bone abnormality. IMPRESSION: No active cardiopulmonary disease. Electronically Signed   By: Fidela Salisbury MD   On: 03/08/2020 20:29   CT ANGIO CHEST PE W OR WO CONTRAST  Result Date: 03/09/2020 CLINICAL DATA:  74 year old female with shortness of breath. Concern for pulmonary embolism. EXAM: CT ANGIOGRAPHY CHEST WITH CONTRAST TECHNIQUE: Multidetector CT imaging of the chest was performed using the standard protocol during bolus administration of intravenous contrast. Multiplanar CT image reconstructions and MIPs were obtained to evaluate the vascular anatomy. CONTRAST:  82mL OMNIPAQUE IOHEXOL 350 MG/ML SOLN COMPARISON:  Chest radiograph dated 03/08/2020. FINDINGS: Cardiovascular: There is mild dilatation of the right heart chamber. The right ventricular lumen measures 4.6 cm in  diameter and the left ventricular lumen is approximately 3 cm in diameter with a RV/LV ratio of 1.5. No pericardial effusion. There is mild atherosclerotic calcification of the thoracic aorta. No aneurysmal dilatation. Large bilateral pulmonary artery  emboli involving the central pulmonary arteries extending into the lobar branches. Mediastinum/Nodes: No hilar or mediastinal adenopathy. The esophagus and the thyroid gland are grossly unremarkable. No mediastinal fluid collection. Lungs/Pleura: The lungs are clear. There is no pleural effusion pneumothorax. The central airways are patent. Upper Abdomen: Cholecystectomy. Musculoskeletal: No chest wall abnormality. No acute or significant osseous findings. Review of the MIP images confirms the above findings. IMPRESSION: Positive for acute PE with of right heart strain (RV/LV Ratio = 1.5) consistent with at least submassive (intermediate risk) PE. The presence of right heart strain has been associated with an increased risk of morbidity and mortality. These results were called by telephone at the time of interpretation on 03/09/2020 at 5:42 pm to provider Dr. Sloan Leiter, who verbally acknowledged these results. Electronically Signed   By: Anner Crete M.D.   On: 03/09/2020 17:56   ECHOCARDIOGRAM COMPLETE  Result Date: 03/09/2020    ECHOCARDIOGRAM REPORT   Patient Name:   [facility] Date of Exam: 03/09/2020 Medical Rec #:  [id number]        Height:       65.0 in Accession #:    [id number]       Weight:       240.0 lb Date of Birth:  01-18-1946       BSA:          2.138 m Patient Age:    37 years         BP:           113/64 mmHg Patient Gender: F                HR:           86 bpm. Exam Location:  [facility] Procedure: 2D Echo, Color Doppler, Cardiac Doppler and Intracardiac            Opacification Agent Indications:     I24.9 Acute Coronary Syndrome  History:         Patient has no prior history of Echocardiogram examinations.                  CKD; Risk  Factors:Hypertension. Asthma.  Sonographer:     Charmayne Sheer RDCS (AE) Referring Phys:  [place] Diagnosing Phys: Ida Rogue MD  Sonographer Comments: Suboptimal apical window. Image acquisition challenging due to patient body habitus. IMPRESSIONS  1. Left ventricular ejection fraction, by estimation, is 55 to 60%. The left ventricle has normal function. The left ventricle has no regional wall motion abnormalities. Left ventricular diastolic parameters are consistent with Grade I diastolic dysfunction (impaired relaxation).  2. Right ventricular systolic function is mildly reduced. The right ventricular size is severely enlarged. There is severely elevated pulmonary artery systolic pressure. The estimated right ventricular systolic pressure is 36.6 mmHg.  3. Right atrial size was moderately dilated. FINDINGS  Left Ventricle: Left ventricular ejection fraction, by estimation, is 55 to 60%. The left ventricle has normal function. The left ventricle has no regional wall motion abnormalities. Definity contrast agent was given IV to delineate the left ventricular  endocardial borders. The left ventricular internal cavity size was normal in size. There is no left ventricular hypertrophy. Left ventricular diastolic parameters are consistent with Grade I diastolic dysfunction (impaired relaxation). Right Ventricle: The right ventricular size is severely enlarged. No increase in right ventricular wall thickness. Right ventricular systolic function is mildly reduced. There is severely elevated pulmonary artery systolic pressure. The tricuspid regurgitant velocity is 3.68 m/s,  and with an assumed right atrial pressure of 10 mmHg, the estimated right ventricular systolic pressure is 84.1 mmHg. Left Atrium: Left atrial size was normal in size. Right Atrium: Right atrial size was moderately dilated. Pericardium: There is no evidence of pericardial effusion. Mitral Valve: The mitral valve is normal in structure.  Normal mobility of the mitral valve leaflets. No evidence of mitral valve regurgitation. No evidence of mitral valve stenosis. MV peak gradient, 3.6 mmHg. The mean mitral valve gradient is 1.0 mmHg. Tricuspid Valve: The tricuspid valve is normal in structure. Tricuspid valve regurgitation is mild . No evidence of tricuspid stenosis. Aortic Valve: The aortic valve is normal in structure. Aortic valve regurgitation is not visualized. Mild to moderate aortic valve sclerosis/calcification is present, without any evidence of aortic stenosis. Aortic valve mean gradient measures 3.0 mmHg. Aortic valve peak gradient measures 6.8 mmHg. Aortic valve area, by VTI measures 2.13 cm. Pulmonic Valve: The pulmonic valve was normal in structure. Pulmonic valve regurgitation is not visualized. No evidence of pulmonic stenosis. Aorta: The aortic root is normal in size and structure. Venous: The inferior vena cava is normal in size with greater than 50% respiratory variability, suggesting right atrial pressure of 3 mmHg. IAS/Shunts: No atrial level shunt detected by color flow Doppler.  LEFT VENTRICLE PLAX 2D LVIDd:         4.22 cm  Diastology LVIDs:         3.37 cm  LV e' lateral:   6.64 cm/s LV PW:         0.86 cm  LV E/e' lateral: 6.6 LV IVS:        0.84 cm  LV e' medial:    6.64 cm/s LVOT diam:     2.10 cm  LV E/e' medial:  6.6 LV SV:         46 LV SV Index:   22 LVOT Area:     3.46 cm  LEFT ATRIUM             Index LA diam:        2.20 cm 1.03 cm/m LA Vol (A2C):   28.6 ml 13.38 ml/m LA Vol (A4C):   50.4 ml 23.58 ml/m LA Biplane Vol: 39.3 ml 18.39 ml/m  AORTIC VALVE                   PULMONIC VALVE AV Area (Vmax):    2.33 cm    PV Vmax:       0.78 m/s AV Area (Vmean):   2.24 cm    PV Vmean:      56.800 cm/s AV Area (VTI):     2.13 cm    PV VTI:        0.140 m AV Vmax:           130.00 cm/s PV Peak grad:  2.4 mmHg AV Vmean:          84.000 cm/s PV Mean grad:  1.0 mmHg AV VTI:            0.218 m AV Peak Grad:      6.8 mmHg  AV Mean Grad:      3.0 mmHg LVOT Vmax:         87.50 cm/s LVOT Vmean:        54.400 cm/s LVOT VTI:          0.134 m LVOT/AV VTI ratio: 0.61  AORTA Ao Root diam: 2.80 cm MITRAL VALVE  TRICUSPID VALVE MV Area (PHT): 4.49 cm    TR Peak grad:   54.2 mmHg MV Peak grad:  3.6 mmHg    TR Vmax:        368.00 cm/s MV Mean grad:  1.0 mmHg MV Vmax:       0.95 m/s    SHUNTS MV Vmean:      52.1 cm/s   Systemic VTI:  0.13 m MV Decel Time: 169 msec    Systemic Diam: 2.10 cm MV E velocity: 43.70 cm/s MV A velocity: 66.80 cm/s MV E/A ratio:  0.65 Ida Rogue MD Electronically signed by Ida Rogue MD Signature Date/Time: 03/09/2020/6:32:24 PM    Final    Assessment/Plan The patient is a 74 year old female who presented to the [facility] emergency department with progressively worsening shortness of breath and chest pain found to have submassive bilateral pulmonary embolism with right heart strain  1.  Submassive pulmonary embolism with right heart strain: Patient with 1 month of progressively worsening shortness of breath and chest pain.  Found to have submassive bilateral pulmonary embolism with right heart strain on CTA.  Is experiencing shortness of breath at rest however this significantly worsens with ambulation.  In the setting of such a large clot burden, with right heart strain, and the severity of symptoms recommend the patient undergo an endovascular pulmonary thrombectomy and thrombolysis.  Procedure, risks and benefit explained to the patient and family member who was at bedside.  All questions were answered.  The patient would like to proceed.  Continue heparin.  We will plan on procedure this afternoon with Dr. Lucky Cowboy  2.  Lower extremity DVT: Venous duplex currently pending  3.  Chronic kidney disease: She is aware that this procedure does use contrast. She is aware that contrast can affect kidney function. We will try to limit the amount of contrast to preserve  kidney function Started normal saline at 75 cc/hr  Discussed with Dr. Mayme Genta, PA-C  03/10/2020 10:46 AM  This note was created with Dragon medical transcription system.  Any error is purely unintentional

## 2020-03-10 NOTE — Op Note (Signed)
[facility]  Percutaneous Study/Intervention Procedural Note   Date of Surgery: 03/10/2020,4:47 PM  Surgeon: Leotis Pain  Pre-operative Diagnosis: Symptomatic bilateral pulmonary emboli  Post-operative diagnosis:  Same  Procedure(s) Performed:  1.  Contrast injection right heart  2.  Thrombolysis with 8 mg of TPA, 4 in the right pulmonary artery and 4 in the left pulmonary artery   3.  Mechanical thrombectomy to left upper lobe and left lower lobe pulmonary arteries with the penumbra CAT 12 catheter and mechanical thrombectomy to the right middle lobe and right lower lobe with the penumbra CAT 8 catheter  4.  Selective catheter placement right middle and lower lobes  5.  Selective catheter placement left upper and lower lobes    Anesthesia: Conscious sedation was administered under my direct supervision by the interventional radiology RN. IV Versed plus fentanyl were utilized. Continuous ECG, pulse oximetry and blood pressure was monitored throughout the entire procedure.  Versed and fentanyl were administered intravenously.  Conscious sedation was administered for a total of 60 minutes using 4 of Versed and 100 mcg of Fentanyl.  EBL: 500 cc  Sheath: 11 French right femoral vein  Contrast: 50 cc   Fluoroscopy Time: 22.4 minutes  Indications:  Patient presents with pulmonary emboli. The patient is symptomatic with hypoxemia and dyspnea on exertion.  There is evidence of right heart strain on the CT angiogram. The patient is otherwise a good candidate for intervention and even the long-term benefits pulmonary angiography with thrombolysis is offered. The risks and benefits are reviewed long-term benefits are discussed. All questions are answered patient agrees to proceed.  Procedure:  Sabrina Baldridge Gattisis a 74 y.o. female who was identified and appropriate procedural time out was performed.  The patient was then placed supine on the table and prepped and draped in  the usual sterile fashion.  Ultrasound was used to evaluate the right common femoral vein.  It was patent, as it was echolucent and compressible.  A digital ultrasound image was acquired for the permanent record.  A Seldinger needle was used to access the right common femoral vein under direct ultrasound guidance.  A 0.035 J wire was advanced without resistance and a 5Fr sheath was placed and then upsized to an 8 [place] sheath.    The wire and pigtail catheter were then negotiated into the right atrium and bolus injection of contrast was utilized to demonstrate the right ventricle and the pulmonary artery outflow. The wire and catheter were then negotiated into the main pulmonary artery where hand injection of contrast was utilized to demonstrate the pulmonary arteries and confirm the locations of the pulmonary emboli.  3000 units of heparin was then given and allowed to circulate.  TPA was reconstituted and delivered onto the table. A total of 8 milligrams of TPA was utilized.  4 mg was administered on the left side and 4 mg was administered on the right side. This was then allowed to dwell.  The Penumbra Cat 12 catheter was then advanced up into the pulmonary vasculature. The left lung was addressed first. Catheter was negotiated into the left lower lobe and selective imaging showed extensive thrombus and mechanical thrombectomy was performed. Follow-up imaging demonstrated a good result and therefore the catheter was renegotiated into the left upper lobe pulmonary artery and again mechanical thrombectomy was performed after selective imaging showed extensive thrombus. Passes were made with both the Penumbra catheter itself as well as introducing the separator. Follow-up imaging was then performed.  Significant improvement in thrombus burden was seen with only a small amount of residual thrombus  The Penumbra Cat 8 catheter was then negotiated to the opposite side.  Attempts to pass the CAT 12 catheter  to the right side were unsuccessful due to the marked dilatation and tortuosity getting the large catheter over there.  I elected to go to the smaller CAT 8 catheter to perform thrombectomy.  The right lung was then addressed. Catheter was negotiated into the right lower lobe and selective imaging showed extensive thrombosis and mechanical thrombectomy was performed. Follow-up imaging demonstrated a good result and therefore the catheter was renegotiated into the right middle lobe pulmonary artery and again mechanical thrombectomy was performed after selective imaging showed extensive thrombus.  There is a small to medium amount of residual thrombus in the right middle lobe and a small amount of thrombus in the right lower lobe.  The angle into the right upper lobe was prohibitive and once we had gotten to about 500 cc of blood loss it was clear that we were not going to address this branch. Passes were made with both the Penumbra catheter itself as well as introducing the separator. Follow-up imaging was then performed with the catheter in the right main pulmonary artery showing significant improvement.  After review these images wires were reintroduced and the catheters removed. Then, the sheath is then pulled and pressures held. A safeguard is placed.    Findings:   Right heart imaging:  Right atrium and right ventricle and the pulmonary outflow tract appears dilated  Right lung: Thrombus in the distal main right pulmonary artery and in the right upper lobe, right middle lobe, and right lower lobe which was fairly extensive.  Left lung: Thrombus in the distal left main pulmonary artery as well as the left upper and left lower lobe pulmonary arteries which was moderate to extensive.    Disposition: Patient was taken to the recovery room in stable condition having tolerated the procedure well.  Leotis Pain 03/10/2020,4:47 PM

## 2020-03-10 NOTE — Progress Notes (Signed)
PROGRESS NOTE    Sabrina Green  MVE:[id number] DOB: [birth year] DOA: 03/08/2020 PCP: Sofie Hartigan, MD    Brief Narrative:  74 year old female with history of hypertension, asthma, IBS, [name], rheumatoid arthritis on methotrexate and abatacept, chronic pain syndrome with arthritis presented to the hospital with fatigue and increasing shortness of breath ongoing for more than 1 month and worse for the last few days.  Also had episodes of left-sided precordial chest discomfort. In the emergency room afebrile.  Troponin 600 and.  EKG shows new inverted T waves on anterior leads.  Chronic thrombocytopenia.  Creatinine 1.77 which is stable.  Increased QT interval.  Started on heparin for non-STEMI and admitted to hospital. Patient had elevated D-dimer, CT scan showed submassive pulmonary embolism with right heart strain. 8/13, scheduled for thrombectomy and catheter directed TPA with vascular surgery today.   Assessment & Plan:   Principal Problem:   Pulmonary embolism with acute cor pulmonale (HCC) Active Problems:   CKD (chronic kidney disease) stage 3, GFR 30-59 ml/min   Essential hypertension   Rheumatoid arthritis involving multiple sites [facility])   NSTEMI (non-ST elevated myocardial infarction) (HCC)   Prolonged QT interval   Thrombocytopenia (HCC)  Acute bilateral pulmonary embolism with cor pulmonale /submassive PE:  Fairly stable on room air.  Large clot burden with shortness of breath on ambulation.   Anticoagulated with heparin.  Lower extremity duplex is pending.   Seen by vascular surgery and scheduled for thrombectomy /clot retrieval /catheter directed TPA today.   Plan for oral anticoagulation with Eliquis after surgical stabilization.    Elevated troponins: Acute coronary syndrome ruled out.  Due to pulmonary embolism.  On aspirin.  Will discontinue aspirin when he started on Eliquis.  Rheumatoid arthritis and chronic pain syndrome: Hold methotrexate, she will take it  at home.  She will resume her medications including allopurinol, opiate pain relief and gabapentin.  CKD stage IIIa: Creatinine at about baseline.  Close monitoring after multiple procedures and contrasted studies.  Thrombocytopenia: Chronic and stable.   DVT prophylaxis: Heparin infusion    Code Status: Full code Family Communication: Patient's daughter at the bedside. Disposition Plan: Status is: Inpatient  Remains inpatient appropriate because:Inpatient level of care appropriate due to severity of illness   Dispo: The patient is from: Home              Anticipated d/c is to: Home              Anticipated d/c date is: 2 days              Patient currently is not medically stable to d/c.  For vascular procedure today.         Consultants:   Cardiology  Vascular surgery  Procedures:  Schedule for TPA today. Antimicrobials:   None   Subjective: Patient was seen in the morning rounds before going for surgery.  Denies any chest pain.  No symptoms at rest, however gets short of breath on ambulation.  Also has pain on her right thigh that had a started about 2 weeks ago. Duplex resulted, right popliteal DVT present.  Objective: Vitals:   03/10/20 1627 03/10/20 1632 03/10/20 1637 03/10/20 1704  BP: (!) 114/58 118/61 118/62 103/77  Pulse:      Resp: 15 20 (!) 23 14  Temp:      TempSrc:      SpO2: 95% 96% 96% 100%  Weight:      Height:  Intake/Output Summary (Last 24 hours) at 03/10/2020 1719 Last data filed at 03/10/2020 1542 Gross per 24 hour  Intake 444.95 ml  Output 0 ml  Net 444.95 ml   Filed Weights   03/08/20 2117 03/09/20 1557  Weight: 108.9 kg 107 kg    Examination:  General exam: Appears calm and comfortable Fairly comfortable on room air.  Without any active symptoms at rest.  However, short of breath on ambulation. Respiratory system: Clear to auscultation. Respiratory effort normal.  No added sounds. Cardiovascular system: S1 & S2  heard, RRR. No pedal edema. Gastrointestinal system: Abdomen is nondistended, soft and nontender. No organomegaly or masses felt. Normal bowel sounds heard.  Obese and pendulous. Central nervous system: Alert and oriented. No focal neurological deficits. Extremities: Symmetric 5 x 5 power. Patient has some tenderness along the right posterior knee. Skin: No rashes, lesions or ulcers Psychiatry: Judgement and insight appear normal. Mood & affect appropriate.     Data Reviewed: I have personally reviewed following labs and imaging studies  CBC: Recent Labs  Lab 03/08/20 1957 03/09/20 0557 03/10/20 0104  WBC 8.0 7.9 6.8  HGB 12.8 11.4* 11.0*  HCT 41.0 33.9* 32.6*  MCV 91.1 85.2 86.2  PLT 114* 100* 144*   Basic Metabolic Panel: Recent Labs  Lab 03/08/20 1957 03/08/20 2328 03/09/20 0557 03/10/20 0104  NA 141  --  141 140  K 4.0  --  3.7 4.0  CL 105  --  107 107  CO2 21*  --  21* 22  GLUCOSE 91  --  99 134*  BUN 38*  --  40* 37*  CREATININE 1.77*  --  1.55* 1.82*  CALCIUM 9.4  --  9.1 8.7*  MG  --  1.7  --   --    GFR: Estimated Creatinine Clearance: 34.5 mL/min (A) (by C-G formula based on SCr of 1.82 mg/dL (H)). Liver Function Tests: No results for input(s): AST, ALT, ALKPHOS, BILITOT, PROT, ALBUMIN in the last 168 hours. No results for input(s): LIPASE, AMYLASE in the last 168 hours. No results for input(s): AMMONIA in the last 168 hours. Coagulation Profile: Recent Labs  Lab 03/08/20 2135 03/08/20 2328  INR ACCURACY OF RESULTS QUESTIONABLE. RECOMMEND RECOLLECT TO VERIFY. 1.2   Cardiac Enzymes: No results for input(s): CKTOTAL, CKMB, CKMBINDEX, TROPONINI in the last 168 hours. BNP (last 3 results) No results for input(s): PROBNP in the last 8760 hours. HbA1C: No results for input(s): HGBA1C in the last 72 hours. CBG: No results for input(s): GLUCAP in the last 168 hours. Lipid Profile: Recent Labs    03/09/20 0557  CHOL 183  HDL 46  LDLCALC 121*  TRIG  80  CHOLHDL 4.0   Thyroid Function Tests: No results for input(s): TSH, T4TOTAL, FREET4, T3FREE, THYROIDAB in the last 72 hours. Anemia Panel: No results for input(s): VITAMINB12, FOLATE, FERRITIN, TIBC, IRON, RETICCTPCT in the last 72 hours. Sepsis Labs: No results for input(s): PROCALCITON, LATICACIDVEN in the last 168 hours.  Recent Results (from the past 240 hour(s))  SARS Coronavirus 2 by RT PCR (hospital order, performed in [facility] hospital lab) Nasopharyngeal Nasopharyngeal Swab     Status: None   Collection Time: 03/08/20  9:17 PM   Specimen: Nasopharyngeal Swab  Result Value Ref Range Status   SARS Coronavirus 2 NEGATIVE NEGATIVE Final    Comment: (NOTE) SARS-CoV-2 target nucleic acids are NOT DETECTED.  The SARS-CoV-2 RNA is generally detectable in upper and lower respiratory specimens during the acute phase  of infection. The lowest concentration of SARS-CoV-2 viral copies this assay can detect is 250 copies / mL. A negative result does not preclude SARS-CoV-2 infection and should not be used as the sole basis for treatment or other patient management decisions.  A negative result may occur with improper specimen collection / handling, submission of specimen other than nasopharyngeal swab, presence of viral mutation(s) within the areas targeted by this assay, and inadequate number of viral copies (<250 copies / mL). A negative result must be combined with clinical observations, patient history, and epidemiological information.  Fact Sheet for Patients:   StrictlyIdeas.no  Fact Sheet for Healthcare Providers: BankingDealers.co.za  This test is not yet approved or  cleared by the [place] FDA and has been authorized for detection and/or diagnosis of SARS-CoV-2 by FDA under an Emergency Use Authorization (EUA).  This EUA will remain in effect (meaning this test can be used) for the duration of the COVID-19  declaration under Section 564(b)(1) of the Act, 21 U.S.C. section 360bbb-3(b)(1), unless the authorization is terminated or revoked sooner.  Performed at [facility], [address], [place], [place] [address]          Radiology Studies: DG Chest 2 View  Result Date: 03/08/2020 CLINICAL DATA:  Dyspnea EXAM: CHEST - 2 VIEW COMPARISON:  09/07/2017 FINDINGS: Lungs are well expanded, symmetric, and clear. No pneumothorax or pleural effusion. Cardiac size within normal limits. Pulmonary vascularity is normal. Osseous structures are age-appropriate. No acute bone abnormality. IMPRESSION: No active cardiopulmonary disease. Electronically Signed   By: Fidela Salisbury MD   On: 03/08/2020 20:29   CT ANGIO CHEST PE W OR WO CONTRAST  Result Date: 03/09/2020 CLINICAL DATA:  74 year old female with shortness of breath. Concern for pulmonary embolism. EXAM: CT ANGIOGRAPHY CHEST WITH CONTRAST TECHNIQUE: Multidetector CT imaging of the chest was performed using the standard protocol during bolus administration of intravenous contrast. Multiplanar CT image reconstructions and MIPs were obtained to evaluate the vascular anatomy. CONTRAST:  95mL OMNIPAQUE IOHEXOL 350 MG/ML SOLN COMPARISON:  Chest radiograph dated 03/08/2020. FINDINGS: Cardiovascular: There is mild dilatation of the right heart chamber. The right ventricular lumen measures 4.6 cm in diameter and the left ventricular lumen is approximately 3 cm in diameter with a RV/LV ratio of 1.5. No pericardial effusion. There is mild atherosclerotic calcification of the thoracic aorta. No aneurysmal dilatation. Large bilateral pulmonary artery emboli involving the central pulmonary arteries extending into the lobar branches. Mediastinum/Nodes: No hilar or mediastinal adenopathy. The esophagus and the thyroid gland are grossly unremarkable. No mediastinal fluid collection. Lungs/Pleura: The lungs are clear. There is no pleural effusion pneumothorax. The  central airways are patent. Upper Abdomen: Cholecystectomy. Musculoskeletal: No chest wall abnormality. No acute or significant osseous findings. Review of the MIP images confirms the above findings. IMPRESSION: Positive for acute PE with of right heart strain (RV/LV Ratio = 1.5) consistent with at least submassive (intermediate risk) PE. The presence of right heart strain has been associated with an increased risk of morbidity and mortality. These results were called by telephone at the time of interpretation on 03/09/2020 at 5:42 pm to provider Dr. Sloan Leiter, who verbally acknowledged these results. Electronically Signed   By: Anner Crete M.D.   On: 03/09/2020 17:56   PERIPHERAL VASCULAR CATHETERIZATION  Result Date: 03/10/2020 See op note  US Venous Img Lower Bilateral (DVT)  Result Date: 03/10/2020 CLINICAL DATA:  History of pulmonary embolism EXAM: BILATERAL LOWER EXTREMITY VENOUS DOPPLER ULTRASOUND TECHNIQUE: Gray-scale sonography with  graded compression, as well as color Doppler and duplex ultrasound were performed to evaluate the lower extremity deep venous systems from the level of the common femoral vein and including the common femoral, femoral, profunda femoral, popliteal and calf veins including the posterior tibial, peroneal and gastrocnemius veins when visible. The superficial great saphenous vein was also interrogated. Spectral Doppler was utilized to evaluate flow at rest and with distal augmentation maneuvers in the common femoral, femoral and popliteal veins. COMPARISON:  None. FINDINGS: RIGHT LOWER EXTREMITY Common Femoral Vein: No evidence of thrombus. Normal compressibility, respiratory phasicity and response to augmentation. Saphenofemoral Junction: No evidence of thrombus. Normal compressibility and flow on color Doppler imaging. Profunda Femoral Vein: No evidence of thrombus. Normal compressibility and flow on color Doppler imaging. Femoral Vein: No evidence of thrombus. Normal  compressibility, respiratory phasicity and response to augmentation. Popliteal Vein: Popliteal thrombus is noted with decreased compressibility. Calf Veins: No evidence of thrombus. Normal compressibility and flow on color Doppler imaging. Superficial Great Saphenous Vein: No evidence of thrombus. Normal compressibility. Venous Reflux:  None. Other Findings:  None. LEFT LOWER EXTREMITY Common Femoral Vein: No evidence of thrombus. Normal compressibility, respiratory phasicity and response to augmentation. Saphenofemoral Junction: No evidence of thrombus. Normal compressibility and flow on color Doppler imaging. Profunda Femoral Vein: No evidence of thrombus. Normal compressibility and flow on color Doppler imaging. Femoral Vein: No evidence of thrombus. Normal compressibility, respiratory phasicity and response to augmentation. Popliteal Vein: No evidence of thrombus. Normal compressibility, respiratory phasicity and response to augmentation. Calf Veins: No evidence of thrombus. Normal compressibility and flow on color Doppler imaging. Superficial Great Saphenous Vein: No evidence of thrombus. Normal compressibility. Venous Reflux:  None. Other Findings:  None. IMPRESSION: Right popliteal deep venous thrombosis. Electronically Signed   By: Inez Catalina M.D.   On: 03/10/2020 12:31   ECHOCARDIOGRAM COMPLETE  Result Date: 03/09/2020    ECHOCARDIOGRAM REPORT   Patient Name:   [facility] Date of Exam: 03/09/2020 Medical Rec #:  [id number]        Height:       65.0 in Accession #:    [id number]       Weight:       240.0 lb Date of Birth:  03-01-1946       BSA:          2.138 m Patient Age:    27 years         BP:           113/64 mmHg Patient Gender: F                HR:           86 bpm. Exam Location:  [facility] Procedure: 2D Echo, Color Doppler, Cardiac Doppler and Intracardiac            Opacification Agent Indications:     I24.9 Acute Coronary Syndrome  History:         Patient has no prior history of  Echocardiogram examinations.                  CKD; Risk Factors:Hypertension. Asthma.  Sonographer:     Charmayne Sheer RDCS (AE) Referring Phys:  [place] Diagnosing Phys: Ida Rogue MD  Sonographer Comments: Suboptimal apical window. Image acquisition challenging due to patient body habitus. IMPRESSIONS  1. Left ventricular ejection fraction, by estimation, is 55 to 60%. The left ventricle has normal function. The left ventricle has no regional  wall motion abnormalities. Left ventricular diastolic parameters are consistent with Grade I diastolic dysfunction (impaired relaxation).  2. Right ventricular systolic function is mildly reduced. The right ventricular size is severely enlarged. There is severely elevated pulmonary artery systolic pressure. The estimated right ventricular systolic pressure is 29.9 mmHg.  3. Right atrial size was moderately dilated. FINDINGS  Left Ventricle: Left ventricular ejection fraction, by estimation, is 55 to 60%. The left ventricle has normal function. The left ventricle has no regional wall motion abnormalities. Definity contrast agent was given IV to delineate the left ventricular  endocardial borders. The left ventricular internal cavity size was normal in size. There is no left ventricular hypertrophy. Left ventricular diastolic parameters are consistent with Grade I diastolic dysfunction (impaired relaxation). Right Ventricle: The right ventricular size is severely enlarged. No increase in right ventricular wall thickness. Right ventricular systolic function is mildly reduced. There is severely elevated pulmonary artery systolic pressure. The tricuspid regurgitant velocity is 3.68 m/s, and with an assumed right atrial pressure of 10 mmHg, the estimated right ventricular systolic pressure is 37.1 mmHg. Left Atrium: Left atrial size was normal in size. Right Atrium: Right atrial size was moderately dilated. Pericardium: There is no evidence of pericardial effusion.  Mitral Valve: The mitral valve is normal in structure. Normal mobility of the mitral valve leaflets. No evidence of mitral valve regurgitation. No evidence of mitral valve stenosis. MV peak gradient, 3.6 mmHg. The mean mitral valve gradient is 1.0 mmHg. Tricuspid Valve: The tricuspid valve is normal in structure. Tricuspid valve regurgitation is mild . No evidence of tricuspid stenosis. Aortic Valve: The aortic valve is normal in structure. Aortic valve regurgitation is not visualized. Mild to moderate aortic valve sclerosis/calcification is present, without any evidence of aortic stenosis. Aortic valve mean gradient measures 3.0 mmHg. Aortic valve peak gradient measures 6.8 mmHg. Aortic valve area, by VTI measures 2.13 cm. Pulmonic Valve: The pulmonic valve was normal in structure. Pulmonic valve regurgitation is not visualized. No evidence of pulmonic stenosis. Aorta: The aortic root is normal in size and structure. Venous: The inferior vena cava is normal in size with greater than 50% respiratory variability, suggesting right atrial pressure of 3 mmHg. IAS/Shunts: No atrial level shunt detected by color flow Doppler.  LEFT VENTRICLE PLAX 2D LVIDd:         4.22 cm  Diastology LVIDs:         3.37 cm  LV e' lateral:   6.64 cm/s LV PW:         0.86 cm  LV E/e' lateral: 6.6 LV IVS:        0.84 cm  LV e' medial:    6.64 cm/s LVOT diam:     2.10 cm  LV E/e' medial:  6.6 LV SV:         46 LV SV Index:   22 LVOT Area:     3.46 cm  LEFT ATRIUM             Index LA diam:        2.20 cm 1.03 cm/m LA Vol (A2C):   28.6 ml 13.38 ml/m LA Vol (A4C):   50.4 ml 23.58 ml/m LA Biplane Vol: 39.3 ml 18.39 ml/m  AORTIC VALVE                   PULMONIC VALVE AV Area (Vmax):    2.33 cm    PV Vmax:       0.78 m/s AV Area (Vmean):  2.24 cm    PV Vmean:      56.800 cm/s AV Area (VTI):     2.13 cm    PV VTI:        0.140 m AV Vmax:           130.00 cm/s PV Peak grad:  2.4 mmHg AV Vmean:          84.000 cm/s PV Mean grad:  1.0 mmHg  AV VTI:            0.218 m AV Peak Grad:      6.8 mmHg AV Mean Grad:      3.0 mmHg LVOT Vmax:         87.50 cm/s LVOT Vmean:        54.400 cm/s LVOT VTI:          0.134 m LVOT/AV VTI ratio: 0.61  AORTA Ao Root diam: 2.80 cm MITRAL VALVE               TRICUSPID VALVE MV Area (PHT): 4.49 cm    TR Peak grad:   54.2 mmHg MV Peak grad:  3.6 mmHg    TR Vmax:        368.00 cm/s MV Mean grad:  1.0 mmHg MV Vmax:       0.95 m/s    SHUNTS MV Vmean:      52.1 cm/s   Systemic VTI:  0.13 m MV Decel Time: 169 msec    Systemic Diam: 2.10 cm MV E velocity: 43.70 cm/s MV A velocity: 66.80 cm/s MV E/A ratio:  0.65 Ida Rogue MD Electronically signed by Ida Rogue MD Signature Date/Time: 03/09/2020/6:32:24 PM    Final         Scheduled Meds:  [MAR Hold] allopurinol  300 mg Oral Daily   [MAR Hold] aspirin EC  81 mg Oral Daily   [MAR Hold] chlorthalidone  12.5 mg Oral Daily   fentaNYL       [MAR Hold] folic acid  130 mcg Oral Daily   [MAR Hold] gabapentin  300 mg Oral QID   heparin sodium (porcine)       [MAR Hold] losartan  100 mg Oral Daily   midazolam       [MAR Hold] pantoprazole  40 mg Oral Daily   Continuous Infusions:  [START ON 03/11/2020] sodium chloride     sodium chloride 75 mL/hr at 03/10/20 1304   heparin 1,000 Units/hr (03/10/20 1708)     LOS: 2 days    Time spent: 25 min    Barb Merino, MD [facility] Pager [phone]

## 2020-03-10 NOTE — Progress Notes (Signed)
Progress Note  Patient Name: Sabrina Green Date of Encounter: 03/10/2020  [facility] Cardiologist: new to [facility]  Subjective   Shortness of breath walking to the bathroom Reports having pain in her right thigh area Right thigh pain worse in the past week, woke up several days ago, was really bothering her, sore to touch, throbbing, feels warm Family the bedside, discussed recent D-dimer, CT scan results -Family reports patient is very sedentary at home, sits on the couch all day watches TV  Inpatient Medications    Scheduled Meds: . allopurinol  300 mg Oral Daily  . aspirin EC  81 mg Oral Daily  . chlorthalidone  12.5 mg Oral Daily  . folic acid  030 mcg Oral Daily  . gabapentin  300 mg Oral QID  . losartan  100 mg Oral Daily  . pantoprazole  40 mg Oral Daily   Continuous Infusions: . [START ON 03/11/2020] sodium chloride    . sodium chloride    . [START ON 03/11/2020] clindamycin (CLEOCIN) IV    . heparin 1,000 Units/hr (03/09/20 1810)   PRN Meds: busPIRone, diphenhydrAMINE, famotidine, HYDROmorphone (DILAUDID) injection, methylPREDNISolone (SOLU-MEDROL) injection, midazolam, ondansetron (ZOFRAN) IV, oxyCODONE, tiZANidine   Vital Signs    Vitals:   03/09/20 2115 03/10/20 0218 03/10/20 0723 03/10/20 1156  BP: 112/60 (!) 106/54 (!) 139/59 (!) 129/46  Pulse: 96 89 83 78  Resp: 20 20 17 17   Temp: 98 F (36.7 C) 98.2 F (36.8 C) 98.5 F (36.9 C) 97.8 F (36.6 C)  TempSrc: Oral Oral Oral Oral  SpO2: 100% 97% 99% 100%  Weight:      Height:        Intake/Output Summary (Last 24 hours) at 03/10/2020 1158 Last data filed at 03/10/2020 0700 Gross per 24 hour  Intake 332.91 ml  Output 300 ml  Net 32.91 ml   Last 3 Weights 03/09/2020 03/08/2020 09/23/2019  Weight (lbs) 235 lb 14.4 oz 240 lb 233 lb 11 oz  Weight (kg) 107.004 kg 108.863 kg 106 kg      Telemetry    Normal sinus rhythm- Personally Reviewed  ECG     - Personally Reviewed  Physical Exam     GEN:  Obese , no acute distress.   Neck: No JVD Cardiac: RRR, no murmurs, rubs, or gallops.  Respiratory: Clear to auscultation bilaterally. GI: Soft, nontender, non-distended  MS: No edema; No deformity. Neuro:  Nonfocal  Psych: Normal affect   Labs    High Sensitivity Troponin:   Recent Labs  Lab 03/08/20 1957 03/08/20 2117 03/08/20 2328 03/09/20 0214 03/09/20 0557  TROPONINIHS 615* 625* 714* 593* 515*      Chemistry Recent Labs  Lab 03/08/20 1957 03/09/20 0557 03/10/20 0104  NA 141 141 140  K 4.0 3.7 4.0  CL 105 107 107  CO2 21* 21* 22  GLUCOSE 91 99 134*  BUN 38* 40* 37*  CREATININE 1.77* 1.55* 1.82*  CALCIUM 9.4 9.1 8.7*  GFRNONAA 28* 33* 27*  GFRAA 33* 39* 32*  ANIONGAP 15 13 11      Hematology Recent Labs  Lab 03/08/20 1957 03/09/20 0557 03/10/20 0104  WBC 8.0 7.9 6.8  RBC 4.50 3.98 3.78*  HGB 12.8 11.4* 11.0*  HCT 41.0 33.9* 32.6*  MCV 91.1 85.2 86.2  MCH 28.4 28.6 29.1  MCHC 31.2 33.6 33.7  RDW 14.9 14.7 14.9  PLT 114* 100* 125*    BNPNo results for input(s): BNP, PROBNP in the last 168 hours.  DDimer No results for input(s): DDIMER in the last 168 hours.   Radiology    DG Chest 2 View  Result Date: 03/08/2020 CLINICAL DATA:  Dyspnea EXAM: CHEST - 2 VIEW COMPARISON:  09/07/2017 FINDINGS: Lungs are well expanded, symmetric, and clear. No pneumothorax or pleural effusion. Cardiac size within normal limits. Pulmonary vascularity is normal. Osseous structures are age-appropriate. No acute bone abnormality. IMPRESSION: No active cardiopulmonary disease. Electronically Signed   By: Fidela Salisbury MD   On: 03/08/2020 20:29   CT ANGIO CHEST PE W OR WO CONTRAST  Result Date: 03/09/2020 CLINICAL DATA:  74 year old female with shortness of breath. Concern for pulmonary embolism. EXAM: CT ANGIOGRAPHY CHEST WITH CONTRAST TECHNIQUE: Multidetector CT imaging of the chest was performed using the standard protocol during bolus administration of  intravenous contrast. Multiplanar CT image reconstructions and MIPs were obtained to evaluate the vascular anatomy. CONTRAST:  34mL OMNIPAQUE IOHEXOL 350 MG/ML SOLN COMPARISON:  Chest radiograph dated 03/08/2020. FINDINGS: Cardiovascular: There is mild dilatation of the right heart chamber. The right ventricular lumen measures 4.6 cm in diameter and the left ventricular lumen is approximately 3 cm in diameter with a RV/LV ratio of 1.5. No pericardial effusion. There is mild atherosclerotic calcification of the thoracic aorta. No aneurysmal dilatation. Large bilateral pulmonary artery emboli involving the central pulmonary arteries extending into the lobar branches. Mediastinum/Nodes: No hilar or mediastinal adenopathy. The esophagus and the thyroid gland are grossly unremarkable. No mediastinal fluid collection. Lungs/Pleura: The lungs are clear. There is no pleural effusion pneumothorax. The central airways are patent. Upper Abdomen: Cholecystectomy. Musculoskeletal: No chest wall abnormality. No acute or significant osseous findings. Review of the MIP images confirms the above findings. IMPRESSION: Positive for acute PE with of right heart strain (RV/LV Ratio = 1.5) consistent with at least submassive (intermediate risk) PE. The presence of right heart strain has been associated with an increased risk of morbidity and mortality. These results were called by telephone at the time of interpretation on 03/09/2020 at 5:42 pm to provider Dr. Sloan Leiter, who verbally acknowledged these results. Electronically Signed   By: Anner Crete M.D.   On: 03/09/2020 17:56   ECHOCARDIOGRAM COMPLETE  Result Date: 03/09/2020    ECHOCARDIOGRAM REPORT   Patient Name:   Sabrina Green Date of Exam: 03/09/2020 Medical Rec #:  [id number]        Height:       65.0 in Accession #:    [id number]       Weight:       240.0 lb Date of Birth:  Jan 16, 1946       BSA:          2.138 m Patient Age:    74 years         BP:           113/64 mmHg  Patient Gender: F                HR:           86 bpm. Exam Location:  [facility] Procedure: 2D Echo, Color Doppler, Cardiac Doppler and Intracardiac            Opacification Agent Indications:     I24.9 Acute Coronary Syndrome  History:         Patient has no prior history of Echocardiogram examinations.                  CKD; Risk Factors:Hypertension. Asthma.  Sonographer:  Charmayne Sheer RDCS (AE) Referring Phys:  [place] Diagnosing Phys: Ida Rogue MD  Sonographer Comments: Suboptimal apical window. Image acquisition challenging due to patient body habitus. IMPRESSIONS  1. Left ventricular ejection fraction, by estimation, is 55 to 60%. The left ventricle has normal function. The left ventricle has no regional wall motion abnormalities. Left ventricular diastolic parameters are consistent with Grade I diastolic dysfunction (impaired relaxation).  2. Right ventricular systolic function is mildly reduced. The right ventricular size is severely enlarged. There is severely elevated pulmonary artery systolic pressure. The estimated right ventricular systolic pressure is 87.5 mmHg.  3. Right atrial size was moderately dilated. FINDINGS  Left Ventricle: Left ventricular ejection fraction, by estimation, is 55 to 60%. The left ventricle has normal function. The left ventricle has no regional wall motion abnormalities. Definity contrast agent was given IV to delineate the left ventricular  endocardial borders. The left ventricular internal cavity size was normal in size. There is no left ventricular hypertrophy. Left ventricular diastolic parameters are consistent with Grade I diastolic dysfunction (impaired relaxation). Right Ventricle: The right ventricular size is severely enlarged. No increase in right ventricular wall thickness. Right ventricular systolic function is mildly reduced. There is severely elevated pulmonary artery systolic pressure. The tricuspid regurgitant velocity is 3.68 m/s, and with an  assumed right atrial pressure of 10 mmHg, the estimated right ventricular systolic pressure is 64.3 mmHg. Left Atrium: Left atrial size was normal in size. Right Atrium: Right atrial size was moderately dilated. Pericardium: There is no evidence of pericardial effusion. Mitral Valve: The mitral valve is normal in structure. Normal mobility of the mitral valve leaflets. No evidence of mitral valve regurgitation. No evidence of mitral valve stenosis. MV peak gradient, 3.6 mmHg. The mean mitral valve gradient is 1.0 mmHg. Tricuspid Valve: The tricuspid valve is normal in structure. Tricuspid valve regurgitation is mild . No evidence of tricuspid stenosis. Aortic Valve: The aortic valve is normal in structure. Aortic valve regurgitation is not visualized. Mild to moderate aortic valve sclerosis/calcification is present, without any evidence of aortic stenosis. Aortic valve mean gradient measures 3.0 mmHg. Aortic valve peak gradient measures 6.8 mmHg. Aortic valve area, by VTI measures 2.13 cm. Pulmonic Valve: The pulmonic valve was normal in structure. Pulmonic valve regurgitation is not visualized. No evidence of pulmonic stenosis. Aorta: The aortic root is normal in size and structure. Venous: The inferior vena cava is normal in size with greater than 50% respiratory variability, suggesting right atrial pressure of 3 mmHg. IAS/Shunts: No atrial level shunt detected by color flow Doppler.  LEFT VENTRICLE PLAX 2D LVIDd:         4.22 cm  Diastology LVIDs:         3.37 cm  LV e' lateral:   6.64 cm/s LV PW:         0.86 cm  LV E/e' lateral: 6.6 LV IVS:        0.84 cm  LV e' medial:    6.64 cm/s LVOT diam:     2.10 cm  LV E/e' medial:  6.6 LV SV:         46 LV SV Index:   22 LVOT Area:     3.46 cm  LEFT ATRIUM             Index LA diam:        2.20 cm 1.03 cm/m LA Vol (A2C):   28.6 ml 13.38 ml/m LA Vol (A4C):   50.4 ml 23.58  ml/m LA Biplane Vol: 39.3 ml 18.39 ml/m  AORTIC VALVE                   PULMONIC VALVE AV Area  (Vmax):    2.33 cm    PV Vmax:       0.78 m/s AV Area (Vmean):   2.24 cm    PV Vmean:      56.800 cm/s AV Area (VTI):     2.13 cm    PV VTI:        0.140 m AV Vmax:           130.00 cm/s PV Peak grad:  2.4 mmHg AV Vmean:          84.000 cm/s PV Mean grad:  1.0 mmHg AV VTI:            0.218 m AV Peak Grad:      6.8 mmHg AV Mean Grad:      3.0 mmHg LVOT Vmax:         87.50 cm/s LVOT Vmean:        54.400 cm/s LVOT VTI:          0.134 m LVOT/AV VTI ratio: 0.61  AORTA Ao Root diam: 2.80 cm MITRAL VALVE               TRICUSPID VALVE MV Area (PHT): 4.49 cm    TR Peak grad:   54.2 mmHg MV Peak grad:  3.6 mmHg    TR Vmax:        368.00 cm/s MV Mean grad:  1.0 mmHg MV Vmax:       0.95 m/s    SHUNTS MV Vmean:      52.1 cm/s   Systemic VTI:  0.13 m MV Decel Time: 169 msec    Systemic Diam: 2.10 cm MV E velocity: 43.70 cm/s MV A velocity: 66.80 cm/s MV E/A ratio:  0.65 Ida Rogue MD Electronically signed by Ida Rogue MD Signature Date/Time: 03/09/2020/6:32:24 PM    Final     Cardiac Studies   Echo 1. Left ventricular ejection fraction, by estimation, is 55 to 60%. The  left ventricle has normal function. The left ventricle has no regional  wall motion abnormalities. Left ventricular diastolic parameters are  consistent with Grade I diastolic  dysfunction (impaired relaxation).  2. Right ventricular systolic function is mildly reduced. The right  ventricular size is severely enlarged. There is severely elevated  pulmonary artery systolic pressure. The estimated right ventricular  systolic pressure is 15.7 mmHg.  3. Right atrial size was moderately dilated.   CT scan chest IMPRESSION: Positive for acute PE with of right heart strain (RV/LV Ratio = 1.5) consistent with at least submassive (intermediate risk) PE. The presence of right heart strain has been associated with an increased risk of morbidity and mortality.    Patient Profile     Sabrina Green is a 74 year old woman with chronic  kidney disease stage III, rheumatoid arthritis, morbid obesity, fibromyalgia presenting with shortness of breath, lightheadedness, diaphoresis, chest tightness  Assessment & Plan    1.  Bilateral pulmonary embolism with right heart strain Presenting with acute respiratory distress, abnormal EKG, elevated troponin Acute onset several days ago, has right leg pain Right heart strain on echo/severe pulmonary hypertension noted  and on CT scan RV dilation consistent with strain Elevated troponin from right heart stretch/strain Shortness of breath on minimal exertion to the bathroom -Case discussed with vascular surgery, they will consult  on the patient May need TPA infusion, aspiration of clot -Order placed for lower extremity venous Doppler -Currently on heparin infusion Would plan on long-term NOAC at discharge  2 morbid obesity Long discussion with daughter at the bedside, patient Very sedentary, sits on the couch all day watches TV Since her prior knee surgery has been very sedentary -Stressed importance that she change her lifestyle, needs to be more active, weight loss, better diet, physical therapy at home if needed  3.  Chronic kidney disease Creatinine 1.8, will need gentle fluids with any contrast  Case discussed with hospitalist service, vascular, nursing and with patient and patient's family at the bedside  Total encounter time more than 35 minutes  Greater than 50% was spent in counseling and coordination of care with the patient   For questions or updates, please contact [place] Please consult [email or web address] for contact info under        Signed, Ida Rogue, MD  03/10/2020, 11:58 AM

## 2020-03-10 NOTE — Progress Notes (Signed)
ANTICOAGULATION CONSULT NOTE  Pharmacy Consult for heparin Indication: chest pain/ACS  Patient Measurements: Height: 5\' 5"  (165.1 cm) Weight: 107 kg (235 lb 14.4 oz) IBW/kg (Calculated) : 57 Heparin Dosing Weight: 77 kg  Labs: Recent Labs    03/08/20 1957 03/08/20 2117 03/08/20 2135 03/08/20 2328 03/08/20 2328 03/09/20 0214 03/09/20 0557 03/09/20 1551 03/10/20 0104  HGB 12.8  --   --   --    < >  --  11.4*  --  11.0*  HCT 41.0  --   --   --   --   --  33.9*  --  32.6*  PLT 114*  --   --   --   --   --  100*  --  125*  APTT  --   --  ACCURACY OF RESULTS QUESTIONABLE. RECOMMEND RECOLLECT TO VERIFY. >160*  --   --   --   --   --   LABPROT  --   --  ACCURACY OF RESULTS QUESTIONABLE. RECOMMEND RECOLLECT TO VERIFY. 14.6  --   --   --   --   --   INR  --   --  ACCURACY OF RESULTS QUESTIONABLE. RECOMMEND RECOLLECT TO VERIFY. 1.2  --   --   --   --   --   HEPARINUNFRC  --   --   --  0.74*   < >  --  0.69 0.63 0.46  CREATININE 1.77*  --   --   --   --   --  1.55*  --  1.82*  TROPONINIHS 615*   < >  --  714*  --  593* 515*  --   --    < > = values in this interval not displayed.    Estimated Creatinine Clearance: 34.5 mL/min (A) (by C-G formula based on SCr of 1.82 mg/dL (H)).   Medical History: Past Medical History:  Diagnosis Date  . Anemia    vitamin b and d deficiency  . Anxiety   . Arthritis    osteoarthrits  . Chronic pain syndrome   . CKD (chronic kidney disease) stage 3, GFR 30-59 ml/min   . Complication of anesthesia    difficulty waking up  . DDD (degenerative disc disease), cervical   . Fibromyalgia    not accurate, per patient  . GERD (gastroesophageal reflux disease)   . Gout   . Hearing loss   . History of kidney stones 1987  . Hypertension   . Rheumatoid arthritis (HCC)    orencia infusions  . Vitamin B12 deficiency   . Vitamin D deficiency     Medications:  Scheduled:  . allopurinol  300 mg Oral Daily  . aspirin EC  81 mg Oral Daily  .  chlorthalidone  12.5 mg Oral Daily  . fentaNYL      . folic acid  353 mcg Oral Daily  . gabapentin  300 mg Oral QID  . heparin sodium (porcine)      . losartan  100 mg Oral Daily  . midazolam      . 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 Baseline CBC WNL. Baseline aPTT/anti-Xa/INR drawn after start of infusion. Patient is being started on heparin drip for management of NSTEMI.  Goal of Therapy:  Heparin level 0.3-0.7 units/ml Monitor platelets by anticoagulation protocol: Yes   Plan:  08/13 @ 0100 HL 0.46 therapeutic. Will continue current rate and will recheck HL w/ am labs, CBC trending down will continue to monitor.  0813 1838 - per nursing - some bleeding after thrombectomy and Dr Lucky Cowboy advised to pause heparin for 2 hours  Lu Duffel, PharmD, BCPS Clinical Pharmacist 03/10/2020 6:38 PM

## 2020-03-10 NOTE — Progress Notes (Signed)
Pt on venturi mask at 5L

## 2020-03-11 ENCOUNTER — Inpatient Hospital Stay: Payer: Medicare Other

## 2020-03-11 LAB — BASIC METABOLIC PANEL
Anion gap: 10 (ref 5–15)
BUN: 38 mg/dL — ABNORMAL HIGH (ref 8–23)
CO2: 23 mmol/L (ref 22–32)
Calcium: 8.9 mg/dL (ref 8.9–10.3)
Chloride: 105 mmol/L (ref 98–111)
Creatinine, Ser: 2.75 mg/dL — ABNORMAL HIGH (ref 0.44–1.00)
GFR calc Af Amer: 19 mL/min — ABNORMAL LOW (ref >60)
GFR calc non Af Amer: 17 mL/min — ABNORMAL LOW (ref >60)
Glucose, Bld: 111 mg/dL — ABNORMAL HIGH (ref 70–99)
Potassium: 4.3 mmol/L (ref 3.5–5.1)
Sodium: 138 mmol/L (ref 135–145)

## 2020-03-11 LAB — CBC
HCT: 29.7 % — ABNORMAL LOW (ref 36.0–46.0)
Hemoglobin: 9.4 g/dL — ABNORMAL LOW (ref 12.0–15.0)
MCH: 28.8 pg (ref 26.0–34.0)
MCHC: 31.6 g/dL (ref 30.0–36.0)
MCV: 91.1 fL (ref 80.0–100.0)
Platelets: 103 10*3/uL — ABNORMAL LOW (ref 150–400)
RBC: 3.26 MIL/uL — ABNORMAL LOW (ref 3.87–5.11)
RDW: 15.8 % — ABNORMAL HIGH (ref 11.5–15.5)
WBC: 7.9 10*3/uL (ref 4.0–10.5)
nRBC: 0.3 % — ABNORMAL HIGH (ref 0.0–0.2)

## 2020-03-11 LAB — HEPARIN LEVEL (UNFRACTIONATED): Heparin Unfractionated: 0.37 IU/mL (ref 0.30–0.70)

## 2020-03-11 MED ORDER — MELATONIN 5 MG PO TABS: 2.5000 mg | ORAL_TABLET | Freq: Every day | ORAL | Status: DC

## 2020-03-11 MED ORDER — SODIUM CHLORIDE 0.9 % IV SOLN: INTRAVENOUS | Status: DC

## 2020-03-11 MED ORDER — CHLORTHALIDONE 25 MG PO TABS: 12.5000 mg | ORAL_TABLET | Freq: Every day | ORAL | Status: DC

## 2020-03-11 MED ORDER — GABAPENTIN 100 MG PO CAPS
100.0000 mg | ORAL_CAPSULE | Freq: Four times a day (QID) | ORAL | Status: DC
  Administered 2020-03-12 – 2020-03-13 (×5): 100 mg via ORAL
  Filled 2020-03-11 (×5): qty 1

## 2020-03-11 MED ORDER — ALBUMIN HUMAN 25 % IV SOLN
25.0000 g | Freq: Once | INTRAVENOUS | Status: AC
  Administered 2020-03-11: 25 g via INTRAVENOUS
  Filled 2020-03-11: qty 100

## 2020-03-11 MED ORDER — LOSARTAN POTASSIUM 50 MG PO TABS: 100.0000 mg | ORAL_TABLET | Freq: Every day | ORAL | Status: DC

## 2020-03-11 MED ORDER — MELATONIN 5 MG PO TABS
5.0000 mg | ORAL_TABLET | Freq: Every day | ORAL | Status: DC
  Administered 2020-03-11 – 2020-03-12 (×2): 5 mg via ORAL
  Filled 2020-03-11 (×2): qty 1

## 2020-03-11 MED ORDER — ACETAMINOPHEN 325 MG PO TABS
650.0000 mg | ORAL_TABLET | Freq: Four times a day (QID) | ORAL | Status: DC | PRN
  Administered 2020-03-11 – 2020-03-12 (×2): 650 mg via ORAL
  Filled 2020-03-11 (×2): qty 2

## 2020-03-11 MED ORDER — HEPARIN (PORCINE) 25000 UT/250ML-% IV SOLN
1050.0000 [IU]/h | INTRAVENOUS | Status: DC
  Administered 2020-03-11: 1000 [IU]/h via INTRAVENOUS
  Administered 2020-03-12: 1050 [IU]/h via INTRAVENOUS
  Filled 2020-03-11 (×2): qty 250

## 2020-03-11 NOTE — Plan of Care (Signed)
  Problem: Education: Goal: Knowledge of General Education information will improve Description Including pain rating scale, medication(s)/side effects and non-pharmacologic comfort measures Outcome: Progressing   

## 2020-03-11 NOTE — Progress Notes (Signed)
ANTICOAGULATION CONSULT NOTE  Pharmacy Consult for heparin Indication: chest pain/ACS  Patient Measurements: Height: 5\' 5"  (165.1 cm) Weight: 107 kg (235 lb 14.4 oz) IBW/kg (Calculated) : 57 Heparin Dosing Weight: 77 kg  Labs: Recent Labs    03/08/20 1957 03/08/20 1957 03/08/20 2117 03/08/20 2135 03/08/20 2328 03/09/20 0214 03/09/20 0557 03/09/20 0557 03/09/20 1551 03/10/20 0104 03/10/20 2313  HGB 12.8   < >  --   --   --   --  11.4*   < >  --  11.0* 11.0*  HCT 41.0   < >  --   --   --   --  33.9*  --   --  32.6* 34.7*  PLT 114*   < >  --   --   --   --  100*  --   --  125* 122*  APTT  --   --   --  ACCURACY OF RESULTS QUESTIONABLE. RECOMMEND RECOLLECT TO VERIFY. >160*  --   --   --   --   --   --   LABPROT  --   --   --  ACCURACY OF RESULTS QUESTIONABLE. RECOMMEND RECOLLECT TO VERIFY. 14.6  --   --   --   --   --   --   INR  --   --   --  ACCURACY OF RESULTS QUESTIONABLE. RECOMMEND RECOLLECT TO VERIFY. 1.2  --   --   --   --   --   --   HEPARINUNFRC  --    < >  --   --  0.74*  --  0.69  --  0.63 0.46  --   CREATININE 1.77*  --   --   --   --   --  1.55*  --   --  1.82*  --   TROPONINIHS 615*  --    < >  --  714* 593* 515*  --   --   --   --    < > = values in this interval not displayed.    Estimated Creatinine Clearance: 34.5 mL/min (A) (by C-G formula based on SCr of 1.82 mg/dL (H)).   Medical History: Past Medical History:  Diagnosis Date  . Anemia    vitamin b and d deficiency  . Anxiety   . Arthritis    osteoarthrits  . Chronic pain syndrome   . CKD (chronic kidney disease) stage 3, GFR 30-59 ml/min   . Complication of anesthesia    difficulty waking up  . DDD (degenerative disc disease), cervical   . Fibromyalgia    not accurate, per patient  . GERD (gastroesophageal reflux disease)   . Gout   . Hearing loss   . History of kidney stones 1987  . Hypertension   . Rheumatoid arthritis (HCC)    orencia infusions  . Vitamin B12 deficiency   . Vitamin D  deficiency     Medications:  Scheduled:  . allopurinol  300 mg Oral Daily  . aspirin EC  81 mg Oral Daily  . chlorthalidone  12.5 mg Oral Daily  . folic acid  993 mcg Oral Daily  . gabapentin  300 mg Oral QID  . losartan  100 mg Oral Daily  . pantoprazole  40 mg Oral Daily    Assessment: Patient w/ h/o CKD, RA s/p knee replacement on 08/2019 arrives w/ c/o SOB and weakness, CXR clear, initial trop 615, EKG showing QTc prolongation  of 527 w/ ST abnormalities in III, aVF, V3, patient was on lovenox subq PTA for knee replacement; however, patient states she has not taken this since march of 2021 so no other anticoagulation PTA recently. Baseline CBC WNL. Baseline aPTT/anti-Xa/INR drawn after start of infusion. Patient is being started on heparin drip for management of NSTEMI.  Goal of Therapy:  Heparin level 0.3-0.7 units/ml Monitor platelets by anticoagulation protocol: Yes   Plan:  08/13 @ 2300 patient was having hemoptysis w/ CT showing pneumonia vs. Pulmonary hemorrhage, patient is s/p thrombectomy x pulmonary embolism w/ thrombolysis w/ tPA. Heparin has been placed on hold d/t hemoptysis, will continue to monitor CBC's and f/u w/ anticoagulation plans in am per vascular.  Tobie Lords, PharmD, BCPS Clinical Pharmacist 03/11/2020 5:45 AM

## 2020-03-11 NOTE — Progress Notes (Signed)
ANTICOAGULATION CONSULT NOTE  Pharmacy Consult for heparin Indication: pulmonary embolism   Patient Measurements: Height: 5\' 5"  (165.1 cm) Weight: 107 kg (235 lb 14.3 oz) IBW/kg (Calculated) : 57 Heparin Dosing Weight: 82 kg  Labs: Recent Labs    03/08/20 1957 03/08/20 2117 03/08/20 2135 03/08/20 2328 03/09/20 0214 03/09/20 0557 03/09/20 0557 03/09/20 1551 03/10/20 0104 03/10/20 0104 03/10/20 2313 03/11/20 1238 03/11/20 1757  HGB   < >  --   --   --   --  11.4*   < >  --  11.0*   < > 11.0* 9.4*  --   HCT   < >  --   --   --   --  33.9*   < >  --  32.6*  --  34.7* 29.7*  --   PLT   < >  --   --   --   --  100*   < >  --  125*  --  122* 103*  --   APTT  --   --  ACCURACY OF RESULTS QUESTIONABLE. RECOMMEND RECOLLECT TO VERIFY. >160*  --   --   --   --   --   --   --   --   --   LABPROT  --   --  ACCURACY OF RESULTS QUESTIONABLE. RECOMMEND RECOLLECT TO VERIFY. 14.6  --   --   --   --   --   --   --   --   --   INR  --   --  ACCURACY OF RESULTS QUESTIONABLE. RECOMMEND RECOLLECT TO VERIFY. 1.2  --   --   --   --   --   --   --   --   --   HEPARINUNFRC   < >  --   --  0.74*  --  0.69   < > 0.63 0.46  --   --   --  0.37  CREATININE   < >  --   --   --   --  1.55*  --   --  1.82*  --   --  2.75*  --   TROPONINIHS  --    < >  --  714* 593* 515*  --   --   --   --   --   --   --    < > = values in this interval not displayed.    Estimated Creatinine Clearance: 22.8 mL/min (A) (by C-G formula based on SCr of 2.75 mg/dL (H)).   Medical History: Past Medical History:  Diagnosis Date   Anemia    vitamin b and d deficiency   Anxiety    Arthritis    osteoarthrits   Chronic pain syndrome    CKD (chronic kidney disease) stage 3, GFR 16-10 ml/min    Complication of anesthesia    difficulty waking up   DDD (degenerative disc disease), cervical    Fibromyalgia    not accurate, per patient   GERD (gastroesophageal reflux disease)    Gout    Hearing loss    History of  kidney stones 1987   Hypertension    Rheumatoid arthritis ([place])    orencia infusions   Vitamin B12 deficiency    Vitamin D deficiency     Medications:  Scheduled:   allopurinol  300 mg Oral Daily   folic acid  960 mcg Oral Daily   gabapentin  300 mg  Oral QID   pantoprazole  40 mg Oral Daily    Assessment: Patient w/ h/o CKD, RA 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 Baseline CBC WNL. Baseline aPTT/anti-Xa/INR drawn after start of infusion. Patient is being started on heparin drip for management of NSTEMI.  08/13 @ 2300 patient was having hemoptysis w/ CT showing pneumonia vs. Pulmonary hemorrhage, patient is s/p thrombectomy x pulmonary embolism w/ thrombolysis w/ tPA. Heparin was placed on hold d/t hemoptysis.  08/14 pharmacy consulted to dose heparin for PE 8/14 1139 Heparin 1000 units/hr restarted 8/14 1757 HL 0.37, therapeutic x 1  Goal of Therapy:  Heparin level 0.3-0.7 units/ml Monitor platelets by anticoagulation protocol: Yes   Plan:  8/14 1800 HL 0.37 is therapeutic. Will continue Heparin at current rate of 1000 units/hr. Will recheck HL in 6 hours for confirmation. Daily CBC while on Heparin drip.  Sabrina Green, PharmD, BCPS 03/11/2020 7:00 PM

## 2020-03-11 NOTE — Progress Notes (Signed)
ANTICOAGULATION CONSULT NOTE  Pharmacy Consult for heparin Indication: pulmonary embolism   Patient Measurements: Height: 5\' 5"  (165.1 cm) Weight: 107 kg (235 lb 14.3 oz) IBW/kg (Calculated) : 57 Heparin Dosing Weight: 82 kg  Labs: Recent Labs    03/08/20 1957 03/08/20 1957 03/08/20 2117 03/08/20 2135 03/08/20 2328 03/09/20 0214 03/09/20 0557 03/09/20 0557 03/09/20 1551 03/10/20 0104 03/10/20 2313  HGB 12.8   < >  --   --   --   --  11.4*   < >  --  11.0* 11.0*  HCT 41.0   < >  --   --   --   --  33.9*  --   --  32.6* 34.7*  PLT 114*   < >  --   --   --   --  100*  --   --  125* 122*  APTT  --   --   --  ACCURACY OF RESULTS QUESTIONABLE. RECOMMEND RECOLLECT TO VERIFY. >160*  --   --   --   --   --   --   LABPROT  --   --   --  ACCURACY OF RESULTS QUESTIONABLE. RECOMMEND RECOLLECT TO VERIFY. 14.6  --   --   --   --   --   --   INR  --   --   --  ACCURACY OF RESULTS QUESTIONABLE. RECOMMEND RECOLLECT TO VERIFY. 1.2  --   --   --   --   --   --   HEPARINUNFRC  --    < >  --   --  0.74*  --  0.69  --  0.63 0.46  --   CREATININE 1.77*  --   --   --   --   --  1.55*  --   --  1.82*  --   TROPONINIHS 615*  --    < >  --  714* 593* 515*  --   --   --   --    < > = values in this interval not displayed.    Estimated Creatinine Clearance: 34.5 mL/min (A) (by C-G formula based on SCr of 1.82 mg/dL (H)).   Medical History: Past Medical History:  Diagnosis Date  . Anemia    vitamin b and d deficiency  . Anxiety   . Arthritis    osteoarthrits  . Chronic pain syndrome   . CKD (chronic kidney disease) stage 3, GFR 30-59 ml/min   . Complication of anesthesia    difficulty waking up  . DDD (degenerative disc disease), cervical   . Fibromyalgia    not accurate, per patient  . GERD (gastroesophageal reflux disease)   . Gout   . Hearing loss   . History of kidney stones 1987  . Hypertension   . Rheumatoid arthritis (HCC)    orencia infusions  . Vitamin B12 deficiency   .  Vitamin D deficiency     Medications:  Scheduled:  . allopurinol  300 mg Oral Daily  . aspirin EC  81 mg Oral Daily  . folic acid  726 mcg Oral Daily  . gabapentin  300 mg Oral QID  . pantoprazole  40 mg Oral Daily    Assessment: Patient w/ h/o CKD, RA s/p knee replacement on 08/2019 arrives w/ c/o SOB and weakness, CXR clear, initial trop 615, EKG showing QTc prolongation of 527 w/ ST abnormalities in III, aVF, V3, patient was on lovenox subq PTA  for knee replacement; however, patient states she has not taken this since March of 2021 so no other anticoagulation PTA recently. Baseline CBC WNL. Baseline aPTT/anti-Xa/INR drawn after start of infusion. Patient is being started on heparin drip for management of NSTEMI.  08/13 @ 2300 patient was having hemoptysis w/ CT showing pneumonia vs. Pulmonary hemorrhage, patient is s/p thrombectomy x pulmonary embolism w/ thrombolysis w/ tPA. Heparin was placed on hold d/t hemoptysis.  08/14 pharmacy consulted to dose heparin for PE  Goal of Therapy:  Heparin level 0.3-0.7 units/ml Monitor platelets by anticoagulation protocol: Yes   Plan:  Patient re-started on heparin 1000 units/hr Check HL in 6 hours Monitor CBC with AM labs  Benn Moulder, PharmD Pharmacy Resident  03/11/2020 11:23 AM

## 2020-03-11 NOTE — Progress Notes (Signed)
Patient with poor circulation. Fingers, ears lobes are cold. Retrieving saturation readings is hard for this patient. Patient was on nonrebreather mask in no distress. Placed patient on HFNC at 9 liters. Pulse ox on toe displayed readings of 96-99%Patient tolerated interventions well.  Reported findings to RN

## 2020-03-11 NOTE — Progress Notes (Signed)
Progress Note  Patient Name: Sabrina Green Date of Encounter: 03/11/2020  [facility] Cardiologist: new to [facility]  Subjective   Underwent thrombolysis with TPA right pulmonary artery, left pulmonary artery, mechanical thrombectomy left upper lobe and left lower lobe pulmonary arteries also with thrombectomy right middle lobe and right lower lobe  Overnight with some hypoxia, CT scan ordered with concern for hemorrhage, heparin held Ventimask use, This morning feels back to her baseline, 2 L nasal cannula oxygen, good saturations Family at the bedside Some hypotension, asymptomatic.  Not far from her baseline blood pressures prior to procedure   Inpatient Medications    Scheduled Meds: . allopurinol  300 mg Oral Daily  . folic acid  341 mcg Oral Daily  . gabapentin  300 mg Oral QID  . pantoprazole  40 mg Oral Daily   Continuous Infusions: . sodium chloride 50 mL/hr at 03/11/20 0832  . heparin 1,000 Units/hr (03/11/20 1139)   PRN Meds: acetaminophen, busPIRone, HYDROmorphone (DILAUDID) injection, ondansetron (ZOFRAN) IV, oxyCODONE, tiZANidine   Vital Signs    Vitals:   03/11/20 0751 03/11/20 0800 03/11/20 0833 03/11/20 1141  BP: (!) 92/40 115/66  (!) 89/55  Pulse: 89  85 97  Resp: 20  (!) 22 18  Temp: 99.1 F (37.3 C)   97.7 F (36.5 C)  TempSrc: Oral   Oral  SpO2: 98%  99% 93%  Weight:      Height:        Intake/Output Summary (Last 24 hours) at 03/11/2020 1258 Last data filed at 03/11/2020 0950 Gross per 24 hour  Intake 706.25 ml  Output 0 ml  Net 706.25 ml   Last 3 Weights 03/11/2020 03/09/2020 03/08/2020  Weight (lbs) 235 lb 14.3 oz 235 lb 14.4 oz 240 lb  Weight (kg) 107 kg 107.004 kg 108.863 kg      Telemetry    Normal sinus rhythm- Personally Reviewed  ECG     - Personally Reviewed  Physical Exam   Constitutional:  oriented to person, place, and time. No distress. Obese HENT:  Head: Normocephalic and atraumatic.  Eyes:  no  discharge. No scleral icterus.  Neck: Normal range of motion. Neck supple. No JVD present.  Cardiovascular: Normal rate, regular rhythm, normal heart sounds and intact distal pulses. Exam reveals no gallop and no friction rub. No edema No murmur heard. Pulmonary/Chest: Effort normal and breath sounds normal. No stridor. No respiratory distress.  no wheezes.  no rales.  no tenderness.  Abdominal: Soft.  no distension.  no tenderness.  Musculoskeletal: Normal range of motion.  no  tenderness or deformity.  Neurological:  normal muscle tone. Coordination normal. No atrophy Skin: Skin is warm and dry. No rash noted. not diaphoretic.  Psychiatric:  normal mood and affect. behavior is normal. Thought content normal.     Labs    High Sensitivity Troponin:   Recent Labs  Lab 03/08/20 1957 03/08/20 2117 03/08/20 2328 03/09/20 0214 03/09/20 0557  TROPONINIHS 615* 625* 714* 593* 515*      Chemistry Recent Labs  Lab 03/08/20 1957 03/09/20 0557 03/10/20 0104  NA 141 141 140  K 4.0 3.7 4.0  CL 105 107 107  CO2 21* 21* 22  GLUCOSE 91 99 134*  BUN 38* 40* 37*  CREATININE 1.77* 1.55* 1.82*  CALCIUM 9.4 9.1 8.7*  GFRNONAA 28* 33* 27*  GFRAA 33* 39* 32*  ANIONGAP 15 13 11      Hematology Recent Labs  Lab 03/09/20 0557 03/10/20 0104  03/10/20 2313  WBC 7.9 6.8 8.4  RBC 3.98 3.78* 3.80*  HGB 11.4* 11.0* 11.0*  HCT 33.9* 32.6* 34.7*  MCV 85.2 86.2 91.3  MCH 28.6 29.1 28.9  MCHC 33.6 33.7 31.7  RDW 14.7 14.9 15.6*  PLT 100* 125* 122*    BNPNo results for input(s): BNP, PROBNP in the last 168 hours.   DDimer No results for input(s): DDIMER in the last 168 hours.   Radiology    CT CHEST WO CONTRAST  Result Date: 03/11/2020 CLINICAL DATA:  Recent pulmonary embolus status post thrombectomy/tPA. Ground-glass opacities and concern for hemorrhage. EXAM: CT CHEST WITHOUT CONTRAST TECHNIQUE: Multidetector CT imaging of the chest was performed following the standard protocol  without IV contrast. COMPARISON:  Radiograph yesterday.  Chest CTA 03/09/2020 FINDINGS: Cardiovascular: Aortic atherosclerosis. No aortic aneurysm. Coronary artery calcifications/stent. No pericardial effusion. Mediastinum/Nodes: No enlarged mediastinal lymph nodes. Limited assessment for hilar adenopathy on noncontrast exam. Right thyroid calcification without evidence of nodule. No esophageal wall thickening. There is a tiny hiatal hernia. Lungs/Pleura: Peribronchovascular ground-glass opacities in the superior segment of both lower lobes, right middle lobe, as well as throughout the dependent left greater than right upper lobe. Minimal superimposed consolidation in the left upper lobe. Minimal associated septal thickening in the left upper lobe. No pneumothorax or pleural effusion. Trachea and central bronchi are patent. Upper Abdomen: Cholecystectomy. There is excreted IV contrast in the kidneys or renal collecting system, likely from prior thrombectomy. Musculoskeletal: There are no acute or suspicious osseous abnormalities. IMPRESSION: 1. Ground-glass opacities within both lungs, more prominent on the left involving the upper lobe. Favor pulmonary hemorrhage given history of recent pulmonary tPA. Possibility of asymmetric edema is also considered but felt less likely. 2. Coronary artery calcifications/stents. Aortic Atherosclerosis (ICD10-I70.0). Electronically Signed   By: Keith Rake M.D.   On: 03/11/2020 00:54   CT ANGIO CHEST PE W OR WO CONTRAST  Result Date: 03/09/2020 CLINICAL DATA:  74 year old female with shortness of breath. Concern for pulmonary embolism. EXAM: CT ANGIOGRAPHY CHEST WITH CONTRAST TECHNIQUE: Multidetector CT imaging of the chest was performed using the standard protocol during bolus administration of intravenous contrast. Multiplanar CT image reconstructions and MIPs were obtained to evaluate the vascular anatomy. CONTRAST:  65mL OMNIPAQUE IOHEXOL 350 MG/ML SOLN COMPARISON:   Chest radiograph dated 03/08/2020. FINDINGS: Cardiovascular: There is mild dilatation of the right heart chamber. The right ventricular lumen measures 4.6 cm in diameter and the left ventricular lumen is approximately 3 cm in diameter with a RV/LV ratio of 1.5. No pericardial effusion. There is mild atherosclerotic calcification of the thoracic aorta. No aneurysmal dilatation. Large bilateral pulmonary artery emboli involving the central pulmonary arteries extending into the lobar branches. Mediastinum/Nodes: No hilar or mediastinal adenopathy. The esophagus and the thyroid gland are grossly unremarkable. No mediastinal fluid collection. Lungs/Pleura: The lungs are clear. There is no pleural effusion pneumothorax. The central airways are patent. Upper Abdomen: Cholecystectomy. Musculoskeletal: No chest wall abnormality. No acute or significant osseous findings. Review of the MIP images confirms the above findings. IMPRESSION: Positive for acute PE with of right heart strain (RV/LV Ratio = 1.5) consistent with at least submassive (intermediate risk) PE. The presence of right heart strain has been associated with an increased risk of morbidity and mortality. These results were called by telephone at the time of interpretation on 03/09/2020 at 5:42 pm to provider Dr. Sloan Leiter, who verbally acknowledged these results. Electronically Signed   By: Anner Crete M.D.   On: 03/09/2020 17:56  PERIPHERAL VASCULAR CATHETERIZATION  Result Date: 03/10/2020 See op note  US Venous Img Lower Bilateral (DVT)  Result Date: 03/10/2020 CLINICAL DATA:  History of pulmonary embolism EXAM: BILATERAL LOWER EXTREMITY VENOUS DOPPLER ULTRASOUND TECHNIQUE: Gray-scale sonography with graded compression, as well as color Doppler and duplex ultrasound were performed to evaluate the lower extremity deep venous systems from the level of the common femoral vein and including the common femoral, femoral, profunda femoral, popliteal and calf  veins including the posterior tibial, peroneal and gastrocnemius veins when visible. The superficial great saphenous vein was also interrogated. Spectral Doppler was utilized to evaluate flow at rest and with distal augmentation maneuvers in the common femoral, femoral and popliteal veins. COMPARISON:  None. FINDINGS: RIGHT LOWER EXTREMITY Common Femoral Vein: No evidence of thrombus. Normal compressibility, respiratory phasicity and response to augmentation. Saphenofemoral Junction: No evidence of thrombus. Normal compressibility and flow on color Doppler imaging. Profunda Femoral Vein: No evidence of thrombus. Normal compressibility and flow on color Doppler imaging. Femoral Vein: No evidence of thrombus. Normal compressibility, respiratory phasicity and response to augmentation. Popliteal Vein: Popliteal thrombus is noted with decreased compressibility. Calf Veins: No evidence of thrombus. Normal compressibility and flow on color Doppler imaging. Superficial Great Saphenous Vein: No evidence of thrombus. Normal compressibility. Venous Reflux:  None. Other Findings:  None. LEFT LOWER EXTREMITY Common Femoral Vein: No evidence of thrombus. Normal compressibility, respiratory phasicity and response to augmentation. Saphenofemoral Junction: No evidence of thrombus. Normal compressibility and flow on color Doppler imaging. Profunda Femoral Vein: No evidence of thrombus. Normal compressibility and flow on color Doppler imaging. Femoral Vein: No evidence of thrombus. Normal compressibility, respiratory phasicity and response to augmentation. Popliteal Vein: No evidence of thrombus. Normal compressibility, respiratory phasicity and response to augmentation. Calf Veins: No evidence of thrombus. Normal compressibility and flow on color Doppler imaging. Superficial Great Saphenous Vein: No evidence of thrombus. Normal compressibility. Venous Reflux:  None. Other Findings:  None. IMPRESSION: Right popliteal deep venous  thrombosis. Electronically Signed   By: Inez Catalina M.D.   On: 03/10/2020 12:31   DG Chest Port 1 View  Result Date: 03/10/2020 CLINICAL DATA:  Hypoxia EXAM: PORTABLE CHEST 1 VIEW COMPARISON:  03/08/2020, CT chest 03/09/2020 FINDINGS: Interim development of ground-glass opacity within the left chest most notable in the upper lobe. Borderline cardiomegaly. Aortic atherosclerosis. No pneumothorax. IMPRESSION: Interim development of ground-glass opacity within the left chest most notable in the upper lobe; findings could be secondary to pneumonia, hemorrhage, or atypical appearance of edema. Electronically Signed   By: Donavan Foil M.D.   On: 03/10/2020 21:57    Cardiac Studies   Echo 1. Left ventricular ejection fraction, by estimation, is 55 to 60%. The  left ventricle has normal function. The left ventricle has no regional  wall motion abnormalities. Left ventricular diastolic parameters are  consistent with Grade I diastolic  dysfunction (impaired relaxation).  2. Right ventricular systolic function is mildly reduced. The right  ventricular size is severely enlarged. There is severely elevated  pulmonary artery systolic pressure. The estimated right ventricular  systolic pressure is 69.6 mmHg.  3. Right atrial size was moderately dilated.   CT scan chest IMPRESSION: Positive for acute PE with of right heart strain (RV/LV Ratio = 1.5) consistent with at least submassive (intermediate risk) PE. The presence of right heart strain has been associated with an increased risk of morbidity and mortality.   Patient Profile     Ms. Barona is a 74 year old woman with chronic kidney  disease stage III, rheumatoid arthritis, morbid obesity, fibromyalgia presenting with shortness of breath, lightheadedness, diaphoresis, chest tightness, bilateral PE on CT scan  Assessment & Plan    1.  Bilateral pulmonary embolism with right heart strain Abnormal EKG, respiratory distress, elevated  troponin on arrival -Lower extremity venous Doppler with popliteal DVT on right -Underwent TPA with mechanical thrombectomy multiple lobes -Overnight desaturation, hypotension, heparin held, blood count stable, now back to her baseline -Discussed with Dr. Lucky Cowboy, has been restarted on heparin infusion with no bolus -If stable through the weekend would look to transition to Eliquis 10 mg twice daily for 7 days then 5 mg twice daily  2 morbid obesity Per family, very sedentary at home, sits and watches TV all day Lifestyle modification recommended  3.  Chronic kidney disease Creatinine 1.8,  On low rate IV fluids  Acute respiratory distress In the setting of bilateral PE On gentle fluids   need to be cautious for pulmonary edema -For any worsening shortness of breath, may need to hold heparin out of concern for pulmonary bleed versus give dose of Lasix for possible edema  Long discussion with patient and family at the bedside, discussed with nursing, vascular, hospital service  Total encounter time more than 35 minutes  Greater than 50% was spent in counseling and coordination of care with the patient   For questions or updates, please contact [facility] Please consult [email or web address] for contact info under        Signed, Ida Rogue, MD  03/11/2020, 12:58 PM

## 2020-03-11 NOTE — TOC Progression Note (Signed)
Transition of Care [facility]) - Progression Note    Patient Details  Name: Sabrina Green MRN: [id number] Date of Birth: 18-Sep-1945  Transition of Care [facility]) CM/SW [place], [place] Phone Number: 03/11/2020, 1:11 PM  Clinical Narrative:    CSW confirmed patient has full benefits with [facility] in addition to Medicare and can receive medication for free though [facility]         Expected Discharge Plan and Services                                                 Social Determinants of Health (SDOH) Interventions    Readmission Risk Interventions No flowsheet data found.

## 2020-03-11 NOTE — Progress Notes (Signed)
PROGRESS NOTE  Sabrina Green KDX:[id number] DOB: 1945-08-08 DOA: 03/08/2020 PCP: Sofie Hartigan, MD  HPI/Recap of past 57 hours: 74 year old female with history of hypertension, asthma, IBS, [place], rheumatoid arthritis on methotrexate and abatacept, chronic pain syndrome with arthritis presented to the hospital with fatigue and increasing shortness of breath ongoing for more than 1 month and worse for the last few days.  Also had episodes of left-sided precordial chest discomfort. In the emergency room afebrile.  Troponin 600 and.  EKG shows new inverted T waves on anterior leads.  Chronic thrombocytopenia.  Creatinine 1.77 which is stable.  Increased QT interval.  Started on heparin for non-STEMI and admitted to hospital. Patient had elevated D-dimer, CT scan showed submassive pulmonary embolism with right heart strain. 8/13, scheduled for thrombectomy and catheter directed TPA with vascular surgery today.  03/11/20: Seen and examined with her daughter at her bedside.  She denies hemoptysis this morning.  No overt bleeding.  Hemoglobin stable at 11.0.  Heparin drip restarted.  Vascular surgery's recommendation.  She denies any chest pain or dyspnea at rest this morning.   Assessment/Plan: Principal Problem:   Pulmonary embolism with acute cor pulmonale (HCC) Active Problems:   CKD (chronic kidney disease) stage 3, GFR 30-59 ml/min   Essential hypertension   Rheumatoid arthritis involving multiple sites [facility])   NSTEMI (non-ST elevated myocardial infarction) (HCC)   Prolonged QT interval   Thrombocytopenia (HCC)   Acute bilateral pulmonary embolism with cor pulmonale/submassive PE post TPA, now on heparin drip Vascular surgery following Hemoptysis overnight with concern for pulmonic hemorrhage Had a chest x-ray done overnight, independently reviewed showing left upper lobe infiltrates, pneumonia and hemorrhage cannot be excluded Okay to resume heparin drip per vascular  surgery Monitor CBC while on heparin drip Currently requiring 2 L O2 to maintain O2 saturation greater than 92% Likely will transition to Eliquis in the morning  AKI on CKD 3B Baseline creatinine appears to be 1.8 with GFR of 32 Creatinine rising, 2.75 with GFR of 19 Avoid nephrotoxins, dehydration and hypotension Monitor urine output Repeat BMP in the morning  Hypotension suspect related to her pulmonary embolism Overnight MAP in the 50s Started normal saline at 50 cc/h Continue gentle IV fluid hydration Maintain MAP greater than 65 Continue to closely monitor vital signs  Acute right lower extremity DVT Per her daughter, sedentary lifestyle Will start Eliquis tomorrow if okay with vascular surgery No SCDs  Acute hypoxic respiratory failure secondary to PE Management per above Home O2 evaluation for DC planning Maintain O2 saturation greater than 92%  Recent left lower extremity orthopedic surgery Done in February 2021 PT to assess Fall precautions  Chronic thrombocytopenia Continue to monitor platelet count while on heparin drip.    Code Status: Full code  Family Communication: Updated her daughter at bedside     Consultants:  Vascular surgery  Procedures:  Thrombolysis and mechanical thrombectomy on 03/10/2020 by vascular surgery Selective catheter placement right middle and lower lobes             Selective catheter placement left upper and lower lobes  Antimicrobials:  None  DVT prophylaxis: Heparin drip  Status is: Inpatient   Dispo: The patient is from: Home.              Anticipated d/c is to: Home with home health services.              Anticipated d/c date is: 03/13/2020  Patient currently not stable for discharge at this time due to ongoing management of acute bilateral PE.        Objective: Vitals:   03/11/20 0751 03/11/20 0800 03/11/20 0833 03/11/20 1141  BP: (!) 92/40 115/66  (!) 89/55  Pulse: 89  85 97  Resp:  20  (!) 22 18  Temp: 99.1 F (37.3 C)   97.7 F (36.5 C)  TempSrc: Oral   Oral  SpO2: 98%  99% 93%  Weight:      Height:        Intake/Output Summary (Last 24 hours) at 03/11/2020 1446 Last data filed at 03/11/2020 1400 Gross per 24 hour  Intake 706.25 ml  Output 850 ml  Net -143.75 ml   Filed Weights   03/08/20 2117 03/09/20 1557 03/11/20 0600  Weight: 108.9 kg 107 kg 107 kg    Exam:   General: 74 y.o. year-old female well developed well nourished in no acute distress.  Alert and oriented x3.  Cardiovascular: Regular rate and rhythm with no rubs or gallops.  No thyromegaly or JVD noted.    Respiratory: Mild rales at bases no wheezing noted.  Good inspiratory effort.  Abdomen: Soft nontender nondistended with normal bowel sounds x4 quadrants.  Musculoskeletal: Trace lower extremity edema bilaterally.  Psychiatry: Mood is appropriate for condition and setting   Data Reviewed: CBC: Recent Labs  Lab 03/08/20 1957 03/09/20 0557 03/10/20 0104 03/10/20 2313 03/11/20 1238  WBC 8.0 7.9 6.8 8.4 7.9  HGB 12.8 11.4* 11.0* 11.0* 9.4*  HCT 41.0 33.9* 32.6* 34.7* 29.7*  MCV 91.1 85.2 86.2 91.3 91.1  PLT 114* 100* 125* 122* 277*   Basic Metabolic Panel: Recent Labs  Lab 03/08/20 1957 03/08/20 2328 03/09/20 0557 03/10/20 0104 03/11/20 1238  NA 141  --  141 140 138  K 4.0  --  3.7 4.0 4.3  CL 105  --  107 107 105  CO2 21*  --  21* 22 23  GLUCOSE 91  --  99 134* 111*  BUN 38*  --  40* 37* 38*  CREATININE 1.77*  --  1.55* 1.82* 2.75*  CALCIUM 9.4  --  9.1 8.7* 8.9  MG  --  1.7  --   --   --    GFR: Estimated Creatinine Clearance: 22.8 mL/min (A) (by C-G formula based on SCr of 2.75 mg/dL (H)). Liver Function Tests: No results for input(s): AST, ALT, ALKPHOS, BILITOT, PROT, ALBUMIN in the last 168 hours. No results for input(s): LIPASE, AMYLASE in the last 168 hours. No results for input(s): AMMONIA in the last 168 hours. Coagulation Profile: Recent Labs  Lab  03/08/20 2135 03/08/20 2328  INR ACCURACY OF RESULTS QUESTIONABLE. RECOMMEND RECOLLECT TO VERIFY. 1.2   Cardiac Enzymes: No results for input(s): CKTOTAL, CKMB, CKMBINDEX, TROPONINI in the last 168 hours. BNP (last 3 results) No results for input(s): PROBNP in the last 8760 hours. HbA1C: No results for input(s): HGBA1C in the last 72 hours. CBG: No results for input(s): GLUCAP in the last 168 hours. Lipid Profile: Recent Labs    03/09/20 0557  CHOL 183  HDL 46  LDLCALC 121*  TRIG 80  CHOLHDL 4.0   Thyroid Function Tests: No results for input(s): TSH, T4TOTAL, FREET4, T3FREE, THYROIDAB in the last 72 hours. Anemia Panel: No results for input(s): VITAMINB12, FOLATE, FERRITIN, TIBC, IRON, RETICCTPCT in the last 72 hours. Urine analysis:    Component Value Date/Time   COLORURINE YELLOW (A) 03/09/2020 4128  APPEARANCEUR CLEAR (A) 03/09/2020 0334   LABSPEC 1.020 03/09/2020 0334   PHURINE 5.0 03/09/2020 0334   GLUCOSEU NEGATIVE 03/09/2020 0334   HGBUR NEGATIVE 03/09/2020 0334   BILIRUBINUR NEGATIVE 03/09/2020 0334   KETONESUR NEGATIVE 03/09/2020 0334   PROTEINUR NEGATIVE 03/09/2020 0334   NITRITE NEGATIVE 03/09/2020 0334   LEUKOCYTESUR NEGATIVE 03/09/2020 0334   Sepsis Labs: @LABRCNTIP (procalcitonin:4,lacticidven:4)  ) Recent Results (from the past 240 hour(s))  SARS Coronavirus 2 by RT PCR (hospital order, performed in [facility] hospital lab) Nasopharyngeal Nasopharyngeal Swab     Status: None   Collection Time: 03/08/20  9:17 PM   Specimen: Nasopharyngeal Swab  Result Value Ref Range Status   SARS Coronavirus 2 NEGATIVE NEGATIVE Final    Comment: (NOTE) SARS-CoV-2 target nucleic acids are NOT DETECTED.  The SARS-CoV-2 RNA is generally detectable in upper and lower respiratory specimens during the acute phase of infection. The lowest concentration of SARS-CoV-2 viral copies this assay can detect is 250 copies / mL. A negative result does not preclude SARS-CoV-2  infection and should not be used as the sole basis for treatment or other patient management decisions.  A negative result may occur with improper specimen collection / handling, submission of specimen other than nasopharyngeal swab, presence of viral mutation(s) within the areas targeted by this assay, and inadequate number of viral copies (<250 copies / mL). A negative result must be combined with clinical observations, patient history, and epidemiological information.  Fact Sheet for Patients:   StrictlyIdeas.no  Fact Sheet for Healthcare Providers: BankingDealers.co.za  This test is not yet approved or  cleared by the [place] FDA and has been authorized for detection and/or diagnosis of SARS-CoV-2 by FDA under an Emergency Use Authorization (EUA).  This EUA will remain in effect (meaning this test can be used) for the duration of the COVID-19 declaration under Section 564(b)(1) of the Act, 21 U.S.C. section 360bbb-3(b)(1), unless the authorization is terminated or revoked sooner.  Performed at [facility], [address], [place], [place] [address]       Studies: CT CHEST WO CONTRAST  Result Date: 03/11/2020 CLINICAL DATA:  Recent pulmonary embolus status post thrombectomy/tPA. Ground-glass opacities and concern for hemorrhage. EXAM: CT CHEST WITHOUT CONTRAST TECHNIQUE: Multidetector CT imaging of the chest was performed following the standard protocol without IV contrast. COMPARISON:  Radiograph yesterday.  Chest CTA 03/09/2020 FINDINGS: Cardiovascular: Aortic atherosclerosis. No aortic aneurysm. Coronary artery calcifications/stent. No pericardial effusion. Mediastinum/Nodes: No enlarged mediastinal lymph nodes. Limited assessment for hilar adenopathy on noncontrast exam. Right thyroid calcification without evidence of nodule. No esophageal wall thickening. There is a tiny hiatal hernia. Lungs/Pleura:  Peribronchovascular ground-glass opacities in the superior segment of both lower lobes, right middle lobe, as well as throughout the dependent left greater than right upper lobe. Minimal superimposed consolidation in the left upper lobe. Minimal associated septal thickening in the left upper lobe. No pneumothorax or pleural effusion. Trachea and central bronchi are patent. Upper Abdomen: Cholecystectomy. There is excreted IV contrast in the kidneys or renal collecting system, likely from prior thrombectomy. Musculoskeletal: There are no acute or suspicious osseous abnormalities. IMPRESSION: 1. Ground-glass opacities within both lungs, more prominent on the left involving the upper lobe. Favor pulmonary hemorrhage given history of recent pulmonary tPA. Possibility of asymmetric edema is also considered but felt less likely. 2. Coronary artery calcifications/stents. Aortic Atherosclerosis (ICD10-I70.0). Electronically Signed   By: Keith Rake M.D.   On: 03/11/2020 00:54   PERIPHERAL VASCULAR CATHETERIZATION  Result  Date: 03/10/2020 See op note  DG Chest Port 1 View  Result Date: 03/10/2020 CLINICAL DATA:  Hypoxia EXAM: PORTABLE CHEST 1 VIEW COMPARISON:  03/08/2020, CT chest 03/09/2020 FINDINGS: Interim development of ground-glass opacity within the left chest most notable in the upper lobe. Borderline cardiomegaly. Aortic atherosclerosis. No pneumothorax. IMPRESSION: Interim development of ground-glass opacity within the left chest most notable in the upper lobe; findings could be secondary to pneumonia, hemorrhage, or atypical appearance of edema. Electronically Signed   By: Donavan Foil M.D.   On: 03/10/2020 21:57    Scheduled Meds:  allopurinol  300 mg Oral Daily   folic acid  677 mcg Oral Daily   gabapentin  300 mg Oral QID   pantoprazole  40 mg Oral Daily    Continuous Infusions:  sodium chloride 50 mL/hr at 03/11/20 0832   heparin 1,000 Units/hr (03/11/20 1139)     LOS: 3 days      Kayleen Memos, MD [facility] Pager [phone]  If 7PM-7AM, please contact night-coverage [email or web address] Password [facility] 03/11/2020, 2:46 PM

## 2020-03-11 NOTE — Plan of Care (Signed)
  Problem: Education: Goal: Knowledge of General Education information will improve Description: Including pain rating scale, medication(s)/side effects and non-pharmacologic comfort measures Outcome: Progressing   Problem: Clinical Measurements: Goal: Ability to maintain clinical measurements within normal limits will improve Outcome: Progressing   Problem: Clinical Measurements: Goal: Respiratory complications will improve Outcome: Progressing   

## 2020-03-11 NOTE — Progress Notes (Signed)
1 Day Post-Op   Subjective/Chief Complaint: Events overnight noted- hemoptysis. Increasing O2 demand. CT Chest reviewed. No further hemoptysis since last night. Heparin resumed. 100% On 2 liters [place]. Reportedly felt weak and dizzy when up to bathroom, otherwise without complaint.   Objective: Vital signs in last 24 hours: Temp:  [97.6 F (36.4 C)-99.5 F (37.5 C)] 97.7 F (36.5 C) (08/14 1141) Pulse Rate:  [30-97] 97 (08/14 1141) Resp:  [9-30] 18 (08/14 1141) BP: (80-135)/(33-79) 89/55 (08/14 1141) SpO2:  [90 %-100 %] 93 % (08/14 1141) Weight:  [637 kg] 107 kg (08/14 0600) Last BM Date: 03/11/20  Intake/Output from previous day: 08/13 0701 - 08/14 0700 In: 466.3 [P.O.:240; I.V.:187; IV Piggyback:39.2] Out: 0  Intake/Output this shift: Total I/O In: 240 [P.O.:240] Out: -   General appearance: alert and no distress Resp: rhonchi LUL Cardio: regular rate and rhythm Extremities: RIGHT access site- soft, C/D/I, leg soft, non tender  Lab Results:  Recent Labs    03/10/20 0104 03/10/20 2313  WBC 6.8 8.4  HGB 11.0* 11.0*  HCT 32.6* 34.7*  PLT 125* 122*   BMET Recent Labs    03/09/20 0557 03/10/20 0104  NA 141 140  K 3.7 4.0  CL 107 107  CO2 21* 22  GLUCOSE 99 134*  BUN 40* 37*  CREATININE 1.55* 1.82*  CALCIUM 9.1 8.7*   PT/INR Recent Labs    03/08/20 2135 03/08/20 2328  LABPROT ACCURACY OF RESULTS QUESTIONABLE. RECOMMEND RECOLLECT TO VERIFY. 14.6  INR ACCURACY OF RESULTS QUESTIONABLE. RECOMMEND RECOLLECT TO VERIFY. 1.2   ABG No results for input(s): PHART, HCO3 in the last 72 hours.  Invalid input(s): PCO2, PO2  Studies/Results: CT CHEST WO CONTRAST  Result Date: 03/11/2020 CLINICAL DATA:  Recent pulmonary embolus status post thrombectomy/tPA. Ground-glass opacities and concern for hemorrhage. EXAM: CT CHEST WITHOUT CONTRAST TECHNIQUE: Multidetector CT imaging of the chest was performed following the standard protocol without IV contrast. COMPARISON:   Radiograph yesterday.  Chest CTA 03/09/2020 FINDINGS: Cardiovascular: Aortic atherosclerosis. No aortic aneurysm. Coronary artery calcifications/stent. No pericardial effusion. Mediastinum/Nodes: No enlarged mediastinal lymph nodes. Limited assessment for hilar adenopathy on noncontrast exam. Right thyroid calcification without evidence of nodule. No esophageal wall thickening. There is a tiny hiatal hernia. Lungs/Pleura: Peribronchovascular ground-glass opacities in the superior segment of both lower lobes, right middle lobe, as well as throughout the dependent left greater than right upper lobe. Minimal superimposed consolidation in the left upper lobe. Minimal associated septal thickening in the left upper lobe. No pneumothorax or pleural effusion. Trachea and central bronchi are patent. Upper Abdomen: Cholecystectomy. There is excreted IV contrast in the kidneys or renal collecting system, likely from prior thrombectomy. Musculoskeletal: There are no acute or suspicious osseous abnormalities. IMPRESSION: 1. Ground-glass opacities within both lungs, more prominent on the left involving the upper lobe. Favor pulmonary hemorrhage given history of recent pulmonary tPA. Possibility of asymmetric edema is also considered but felt less likely. 2. Coronary artery calcifications/stents. Aortic Atherosclerosis (ICD10-I70.0). Electronically Signed   By: Keith Rake M.D.   On: 03/11/2020 00:54   CT ANGIO CHEST PE W OR WO CONTRAST  Result Date: 03/09/2020 CLINICAL DATA:  74 year old female with shortness of breath. Concern for pulmonary embolism. EXAM: CT ANGIOGRAPHY CHEST WITH CONTRAST TECHNIQUE: Multidetector CT imaging of the chest was performed using the standard protocol during bolus administration of intravenous contrast. Multiplanar CT image reconstructions and MIPs were obtained to evaluate the vascular anatomy. CONTRAST:  54mL OMNIPAQUE IOHEXOL 350 MG/ML SOLN COMPARISON:  Chest radiograph dated 03/08/2020.  FINDINGS: Cardiovascular: There is mild dilatation of the right heart chamber. The right ventricular lumen measures 4.6 cm in diameter and the left ventricular lumen is approximately 3 cm in diameter with a RV/LV ratio of 1.5. No pericardial effusion. There is mild atherosclerotic calcification of the thoracic aorta. No aneurysmal dilatation. Large bilateral pulmonary artery emboli involving the central pulmonary arteries extending into the lobar branches. Mediastinum/Nodes: No hilar or mediastinal adenopathy. The esophagus and the thyroid gland are grossly unremarkable. No mediastinal fluid collection. Lungs/Pleura: The lungs are clear. There is no pleural effusion pneumothorax. The central airways are patent. Upper Abdomen: Cholecystectomy. Musculoskeletal: No chest wall abnormality. No acute or significant osseous findings. Review of the MIP images confirms the above findings. IMPRESSION: Positive for acute PE with of right heart strain (RV/LV Ratio = 1.5) consistent with at least submassive (intermediate risk) PE. The presence of right heart strain has been associated with an increased risk of morbidity and mortality. These results were called by telephone at the time of interpretation on 03/09/2020 at 5:42 pm to provider Dr. Sloan Leiter, who verbally acknowledged these results. Electronically Signed   By: Anner Crete M.D.   On: 03/09/2020 17:56   PERIPHERAL VASCULAR CATHETERIZATION  Result Date: 03/10/2020 See op note  US Venous Img Lower Bilateral (DVT)  Result Date: 03/10/2020 CLINICAL DATA:  History of pulmonary embolism EXAM: BILATERAL LOWER EXTREMITY VENOUS DOPPLER ULTRASOUND TECHNIQUE: Gray-scale sonography with graded compression, as well as color Doppler and duplex ultrasound were performed to evaluate the lower extremity deep venous systems from the level of the common femoral vein and including the common femoral, femoral, profunda femoral, popliteal and calf veins including the posterior  tibial, peroneal and gastrocnemius veins when visible. The superficial great saphenous vein was also interrogated. Spectral Doppler was utilized to evaluate flow at rest and with distal augmentation maneuvers in the common femoral, femoral and popliteal veins. COMPARISON:  None. FINDINGS: RIGHT LOWER EXTREMITY Common Femoral Vein: No evidence of thrombus. Normal compressibility, respiratory phasicity and response to augmentation. Saphenofemoral Junction: No evidence of thrombus. Normal compressibility and flow on color Doppler imaging. Profunda Femoral Vein: No evidence of thrombus. Normal compressibility and flow on color Doppler imaging. Femoral Vein: No evidence of thrombus. Normal compressibility, respiratory phasicity and response to augmentation. Popliteal Vein: Popliteal thrombus is noted with decreased compressibility. Calf Veins: No evidence of thrombus. Normal compressibility and flow on color Doppler imaging. Superficial Great Saphenous Vein: No evidence of thrombus. Normal compressibility. Venous Reflux:  None. Other Findings:  None. LEFT LOWER EXTREMITY Common Femoral Vein: No evidence of thrombus. Normal compressibility, respiratory phasicity and response to augmentation. Saphenofemoral Junction: No evidence of thrombus. Normal compressibility and flow on color Doppler imaging. Profunda Femoral Vein: No evidence of thrombus. Normal compressibility and flow on color Doppler imaging. Femoral Vein: No evidence of thrombus. Normal compressibility, respiratory phasicity and response to augmentation. Popliteal Vein: No evidence of thrombus. Normal compressibility, respiratory phasicity and response to augmentation. Calf Veins: No evidence of thrombus. Normal compressibility and flow on color Doppler imaging. Superficial Great Saphenous Vein: No evidence of thrombus. Normal compressibility. Venous Reflux:  None. Other Findings:  None. IMPRESSION: Right popliteal deep venous thrombosis. Electronically Signed    By: Inez Catalina M.D.   On: 03/10/2020 12:31   DG Chest Port 1 View  Result Date: 03/10/2020 CLINICAL DATA:  Hypoxia EXAM: PORTABLE CHEST 1 VIEW COMPARISON:  03/08/2020, CT chest 03/09/2020 FINDINGS: Interim development of ground-glass opacity within the left chest  most notable in the upper lobe. Borderline cardiomegaly. Aortic atherosclerosis. No pneumothorax. IMPRESSION: Interim development of ground-glass opacity within the left chest most notable in the upper lobe; findings could be secondary to pneumonia, hemorrhage, or atypical appearance of edema. Electronically Signed   By: Donavan Foil M.D.   On: 03/10/2020 21:57    Anti-infectives: Anti-infectives (From admission, onward)   Start     Dose/Rate Route Frequency Ordered Stop   03/11/20 0000  clindamycin (CLEOCIN) IVPB 300 mg       Note to Pharmacy: To be given in specials   300 mg 100 mL/hr over 30 Minutes Intravenous  Once 03/10/20 1152 03/10/20 1612      Assessment/Plan: s/p Procedure(s): PULMONARY THROMBECTOMY (N/A) Continue Heparin gtt  CT reviewed- no further hemoptysis or evidence of hemorrhage. Remains hemodynamically stable- intermittent tachycardia. No further Vascular Surgery intervention warranted at this juncture. Right popliteal DVT- Continue Heparin gtt- will need NOAC upon discharge Supportive care. Monitor Troponin, Renal function   LOS: 3 days    Jamesetta So A 03/11/2020

## 2020-03-12 DIAGNOSIS — R0902 Hypoxemia: Secondary | ICD-10-CM

## 2020-03-12 DIAGNOSIS — I2609 Other pulmonary embolism with acute cor pulmonale: Principal | ICD-10-CM

## 2020-03-12 LAB — BASIC METABOLIC PANEL
Anion gap: 13 (ref 5–15)
BUN: 41 mg/dL — ABNORMAL HIGH (ref 8–23)
CO2: 21 mmol/L — ABNORMAL LOW (ref 22–32)
Calcium: 8.8 mg/dL — ABNORMAL LOW (ref 8.9–10.3)
Chloride: 109 mmol/L (ref 98–111)
Creatinine, Ser: 2.21 mg/dL — ABNORMAL HIGH (ref 0.44–1.00)
GFR calc Af Amer: 25 mL/min — ABNORMAL LOW (ref >60)
GFR calc non Af Amer: 22 mL/min — ABNORMAL LOW (ref >60)
Glucose, Bld: 140 mg/dL — ABNORMAL HIGH (ref 70–99)
Potassium: 3.8 mmol/L (ref 3.5–5.1)
Sodium: 143 mmol/L (ref 135–145)

## 2020-03-12 LAB — BLOOD GAS, ARTERIAL
Acid-base deficit: 1.9 mmol/L (ref 0.0–2.0)
Bicarbonate: 21.1 mmol/L (ref 20.0–28.0)
FIO2: 28
O2 Saturation: 94.5 %
Patient temperature: 37
pCO2 arterial: 29 mmHg — ABNORMAL LOW (ref 32.0–48.0)
pH, Arterial: 7.47 — ABNORMAL HIGH (ref 7.350–7.450)
pO2, Arterial: 68 mmHg — ABNORMAL LOW (ref 83.0–108.0)

## 2020-03-12 LAB — HEPARIN LEVEL (UNFRACTIONATED)
Heparin Unfractionated: 0.3 IU/mL (ref 0.30–0.70)
Heparin Unfractionated: 0.43 IU/mL (ref 0.30–0.70)
Heparin Unfractionated: 0.51 IU/mL (ref 0.30–0.70)

## 2020-03-12 LAB — CBC
HCT: 28.8 % — ABNORMAL LOW (ref 36.0–46.0)
Hemoglobin: 9.5 g/dL — ABNORMAL LOW (ref 12.0–15.0)
MCH: 29.1 pg (ref 26.0–34.0)
MCHC: 33 g/dL (ref 30.0–36.0)
MCV: 88.1 fL (ref 80.0–100.0)
Platelets: 101 10*3/uL — ABNORMAL LOW (ref 150–400)
RBC: 3.27 MIL/uL — ABNORMAL LOW (ref 3.87–5.11)
RDW: 15.8 % — ABNORMAL HIGH (ref 11.5–15.5)
WBC: 8.8 10*3/uL (ref 4.0–10.5)
nRBC: 0.2 % (ref 0.0–0.2)

## 2020-03-12 MED ORDER — AMLODIPINE BESYLATE 5 MG PO TABS
5.0000 mg | ORAL_TABLET | Freq: Every day | ORAL | Status: DC
  Administered 2020-03-12: 5 mg via ORAL
  Filled 2020-03-12: qty 1

## 2020-03-12 MED ORDER — ATORVASTATIN CALCIUM 20 MG PO TABS
40.0000 mg | ORAL_TABLET | Freq: Every day | ORAL | Status: DC
  Administered 2020-03-12 – 2020-03-13 (×2): 40 mg via ORAL
  Filled 2020-03-12 (×2): qty 2

## 2020-03-12 MED ORDER — FUROSEMIDE 10 MG/ML IJ SOLN
20.0000 mg | Freq: Once | INTRAMUSCULAR | Status: AC
  Administered 2020-03-12: 20 mg via INTRAVENOUS
  Filled 2020-03-12: qty 2

## 2020-03-12 MED ORDER — LABETALOL HCL 100 MG PO TABS
100.0000 mg | ORAL_TABLET | Freq: Two times a day (BID) | ORAL | Status: DC
  Administered 2020-03-12 – 2020-03-13 (×2): 100 mg via ORAL
  Filled 2020-03-12 (×3): qty 1

## 2020-03-12 MED ORDER — FUROSEMIDE 10 MG/ML IJ SOLN: 20.0000 mg | Freq: Every day | INTRAMUSCULAR | Status: DC

## 2020-03-12 NOTE — Progress Notes (Signed)
PROGRESS NOTE  Sabrina Green JGO:[id number] DOB: [birth year] DOA: 03/08/2020 PCP: Sofie Hartigan, MD  HPI/Recap of past 31 hours: 74 year old female with history of hypertension, asthma, IBS, Nash, rheumatoid arthritis on methotrexate and abatacept, chronic pain syndrome with arthritis presented to the hospital with fatigue and increasing shortness of breath ongoing for more than 1 month and worse for the last few days.  Also had episodes of left-sided precordial chest discomfort. In the emergency room afebrile.  Troponin 600 and.  EKG shows new inverted T waves on anterior leads.  Chronic thrombocytopenia.  Creatinine 1.77 which is stable.  Increased QT interval.  Started on heparin for non-STEMI and admitted to hospital. Patient had elevated D-dimer, CT scan showed submassive pulmonary embolism with right heart strain. 8/13, scheduled for thrombectomy and catheter directed TPA with vascular surgery today.  03/12/20: Seen and examined with her daughter at her bedside.  Reports dizziness upon standing.  Orthostatic vital signs ordered.  No overt bleeding.  Vital signs and labs stable.  On heparin drip.   Assessment/Plan: Principal Problem:   Pulmonary embolism with acute cor pulmonale (HCC) Active Problems:   CKD (chronic kidney disease) stage 3, GFR 30-59 ml/min   Essential hypertension   Rheumatoid arthritis involving multiple sites [facility])   NSTEMI (non-ST elevated myocardial infarction) (HCC)   Prolonged QT interval   Thrombocytopenia (HCC)   Acute bilateral pulmonary embolism with cor pulmonale/submassive PE post TPA, now on heparin drip Vascular surgery following Hemoptysis overnight with concern for pulmonic hemorrhage Had a chest x-ray done overnight, independently reviewed showing left upper lobe infiltrates, pneumonia and hemorrhage cannot be excluded Okay to resume heparin drip per vascular surgery Monitor CBC while on heparin drip Currently requiring 2 L O2 to  maintain O2 saturation greater than 92% Likely will transition to Eliquis on Monday, 03/13/2020  Sinus tachycardia likely secondary to acute PE Closely monitor on telemetry Treat as indicated  AKI on CKD 3B Baseline creatinine appears to be 1.8 with GFR of 32 Creatinine downtrending 2.2 from 2.75 Avoid nephrotoxins, dehydration and hypotension Monitor urine output, 2.0 L urine output recorded in the last 24 hours. Repeat BMP in the morning  Resolved, hypotension suspect related to her pulmonary embolism On 8/13 MAP in the 67s Now hypertensive, hold off on losartan chlorthalidone due to AKI Start Norvasc 5 mg daily. Continue to closely monitor vital signs  Essential hypertension Management as stated above  Hyperlipidemia LDL 121 Started Lipitor 40 mg daily Will need to follow-up with your PCP  Acute right lower extremity DVT Per her daughter, sedentary lifestyle Will start Eliquis 03/13/2020 No SCDs  Acute hypoxic respiratory failure secondary to PE Management per above Home O2 evaluation for DC planning Maintain O2 saturation greater than 92%  Recent left lower extremity orthopedic surgery Done in February 2021 PT to assess Fall precautions  Chronic thrombocytopenia Continue to monitor platelet count while on heparin drip. No overt bleeding Platelet count stable 101K    Code Status: Full code  Family Communication: Updated her daughter at bedside     Consultants:  Vascular surgery  Procedures:  Thrombolysis and mechanical thrombectomy on 03/10/2020 by vascular surgery Selective catheter placement right middle and lower lobes             Selective catheter placement left upper and lower lobes  Antimicrobials:  None  DVT prophylaxis: Heparin drip  Status is: Inpatient   Dispo: The patient is from: Home.  Anticipated d/c is to: Home with home health services.              Anticipated d/c date is: 03/13/2020              Patient  currently not stable for discharge at this time due to ongoing management of acute bilateral PE.        Objective: Vitals:   03/11/20 2110 03/12/20 0356 03/12/20 0759 03/12/20 1122  BP:  (!) 106/50 (!) 129/58 (!) 133/53  Pulse:  (!) 111 93 100  Resp:   18 18  Temp:  98.7 F (37.1 C) 97.8 F (36.6 C) 98.3 F (36.8 C)  TempSrc:  Oral Oral Oral  SpO2: 100% 100% 100% 99%  Weight:  106.6 kg    Height:        Intake/Output Summary (Last 24 hours) at 03/12/2020 1440 Last data filed at 03/12/2020 1330 Gross per 24 hour  Intake 1020.86 ml  Output 1200 ml  Net -179.14 ml   Filed Weights   03/09/20 1557 03/11/20 0600 03/12/20 0356  Weight: 107 kg 107 kg 106.6 kg    Exam:  . General: 74 y.o. year-old female well-developed well-nourished in no distress.  Alert and noted x3. .   Cardiovascular: Tachycardic no rubs or gallops.   Marland Kitchen Respiratory: Bibasilar rales no wheezes.  Good inspiratory effort. . Abdomen: Soft nontender normal bowel sounds present.   . Musculoskeletal: Trace lower extremity edema bilaterally. Marland Kitchen Psychiatry: Mood is appropriate for condition and setting.  Data Reviewed: CBC: Recent Labs  Lab 03/09/20 0557 03/10/20 0104 03/10/20 2313 03/11/20 1238 03/12/20 0039  WBC 7.9 6.8 8.4 7.9 8.8  HGB 11.4* 11.0* 11.0* 9.4* 9.5*  HCT 33.9* 32.6* 34.7* 29.7* 28.8*  MCV 85.2 86.2 91.3 91.1 88.1  PLT 100* 125* 122* 103* 174*   Basic Metabolic Panel: Recent Labs  Lab 03/08/20 1957 03/08/20 2328 03/09/20 0557 03/10/20 0104 03/11/20 1238 03/12/20 0039  NA 141  --  141 140 138 143  K 4.0  --  3.7 4.0 4.3 3.8  CL 105  --  107 107 105 109  CO2 21*  --  21* 22 23 21*  GLUCOSE 91  --  99 134* 111* 140*  BUN 38*  --  40* 37* 38* 41*  CREATININE 1.77*  --  1.55* 1.82* 2.75* 2.21*  CALCIUM 9.4  --  9.1 8.7* 8.9 8.8*  MG  --  1.7  --   --   --   --    GFR: Estimated Creatinine Clearance: 28.3 mL/min (A) (by C-G formula based on SCr of 2.21 mg/dL (H)). Liver  Function Tests: No results for input(s): AST, ALT, ALKPHOS, BILITOT, PROT, ALBUMIN in the last 168 hours. No results for input(s): LIPASE, AMYLASE in the last 168 hours. No results for input(s): AMMONIA in the last 168 hours. Coagulation Profile: Recent Labs  Lab 03/08/20 2135 03/08/20 2328  INR ACCURACY OF RESULTS QUESTIONABLE. RECOMMEND RECOLLECT TO VERIFY. 1.2   Cardiac Enzymes: No results for input(s): CKTOTAL, CKMB, CKMBINDEX, TROPONINI in the last 168 hours. BNP (last 3 results) No results for input(s): PROBNP in the last 8760 hours. HbA1C: No results for input(s): HGBA1C in the last 72 hours. CBG: No results for input(s): GLUCAP in the last 168 hours. Lipid Profile: No results for input(s): CHOL, HDL, LDLCALC, TRIG, CHOLHDL, LDLDIRECT in the last 72 hours. Thyroid Function Tests: No results for input(s): TSH, T4TOTAL, FREET4, T3FREE, THYROIDAB in the last 72 hours. Anemia  Panel: No results for input(s): VITAMINB12, FOLATE, FERRITIN, TIBC, IRON, RETICCTPCT in the last 72 hours. Urine analysis:    Component Value Date/Time   COLORURINE YELLOW (A) 03/09/2020 0334   APPEARANCEUR CLEAR (A) 03/09/2020 0334   LABSPEC 1.020 03/09/2020 0334   PHURINE 5.0 03/09/2020 0334   GLUCOSEU NEGATIVE 03/09/2020 0334   HGBUR NEGATIVE 03/09/2020 0334   BILIRUBINUR NEGATIVE 03/09/2020 0334   KETONESUR NEGATIVE 03/09/2020 0334   PROTEINUR NEGATIVE 03/09/2020 0334   NITRITE NEGATIVE 03/09/2020 0334   LEUKOCYTESUR NEGATIVE 03/09/2020 0334   Sepsis Labs: @LABRCNTIP (procalcitonin:4,lacticidven:4)  ) Recent Results (from the past 240 hour(s))  SARS Coronavirus 2 by RT PCR (hospital order, performed in [facility] hospital lab) Nasopharyngeal Nasopharyngeal Swab     Status: None   Collection Time: 03/08/20  9:17 PM   Specimen: Nasopharyngeal Swab  Result Value Ref Range Status   SARS Coronavirus 2 NEGATIVE NEGATIVE Final    Comment: (NOTE) SARS-CoV-2 target nucleic acids are NOT  DETECTED.  The SARS-CoV-2 RNA is generally detectable in upper and lower respiratory specimens during the acute phase of infection. The lowest concentration of SARS-CoV-2 viral copies this assay can detect is 250 copies / mL. A negative result does not preclude SARS-CoV-2 infection and should not be used as the sole basis for treatment or other patient management decisions.  A negative result may occur with improper specimen collection / handling, submission of specimen other than nasopharyngeal swab, presence of viral mutation(s) within the areas targeted by this assay, and inadequate number of viral copies (<250 copies / mL). A negative result must be combined with clinical observations, patient history, and epidemiological information.  Fact Sheet for Patients:   StrictlyIdeas.no  Fact Sheet for Healthcare Providers: BankingDealers.co.za  This test is not yet approved or  cleared by the [place] FDA and has been authorized for detection and/or diagnosis of SARS-CoV-2 by FDA under an Emergency Use Authorization (EUA).  This EUA will remain in effect (meaning this test can be used) for the duration of the COVID-19 declaration under Section 564(b)(1) of the Act, 21 U.S.C. section 360bbb-3(b)(1), unless the authorization is terminated or revoked sooner.  Performed at [facility], [address] [place], [place] [address]       Studies: No results found.  Scheduled Meds: . allopurinol  300 mg Oral Daily  . atorvastatin  40 mg Oral Daily  . folic acid  093 mcg Oral Daily  . gabapentin  100 mg Oral QID  . melatonin  5 mg Oral QHS  . pantoprazole  40 mg Oral Daily    Continuous Infusions: . heparin 1,050 Units/hr (03/12/20 1221)     LOS: 4 days     Kayleen Memos, MD [facility] Pager [phone]  If 7PM-7AM, please contact night-coverage [email or web address] Password [email or web address] 03/12/2020, 2:40 PM

## 2020-03-12 NOTE — Progress Notes (Signed)
Schleicher for heparin Indication: pulmonary embolism   Patient Measurements: Height: 5\' 5"  (165.1 cm) Weight: 107 kg (235 lb 14.3 oz) IBW/kg (Calculated) : 57 Heparin Dosing Weight: 82 kg  Labs: Recent Labs     0000 03/09/20 0214 03/09/20 0557 03/09/20 1551 03/10/20 0104 03/10/20 0104 03/10/20 2313 03/10/20 2313 03/11/20 1238 03/11/20 1757 03/12/20 0039  HGB   < >  --  11.4*  --  11.0*   < > 11.0*   < > 9.4*  --  9.5*  HCT   < >  --  33.9*  --  32.6*   < > 34.7*  --  29.7*  --  28.8*  PLT   < >  --  100*  --  125*   < > 122*  --  103*  --  101*  HEPARINUNFRC  --   --  0.69   < > 0.46  --   --   --   --  0.37 0.30  CREATININE   < >  --  1.55*  --  1.82*  --   --   --  2.75*  --  2.21*  TROPONINIHS  --  593* 515*  --   --   --   --   --   --   --   --    < > = values in this interval not displayed.    Estimated Creatinine Clearance: 28.4 mL/min (A) (by C-G formula based on SCr of 2.21 mg/dL (H)).   Medical History: Past Medical History:  Diagnosis Date  . Anemia    vitamin b and d deficiency  . Anxiety   . Arthritis    osteoarthrits  . Chronic pain syndrome   . CKD (chronic kidney disease) stage 3, GFR 30-59 ml/min   . Complication of anesthesia    difficulty waking up  . DDD (degenerative disc disease), cervical   . Fibromyalgia    not accurate, per patient  . GERD (gastroesophageal reflux disease)   . Gout   . Hearing loss   . History of kidney stones 1987  . Hypertension   . Rheumatoid arthritis (HCC)    orencia infusions  . Vitamin B12 deficiency   . Vitamin D deficiency     Medications:  Scheduled:  . allopurinol  300 mg Oral Daily  . folic acid  941 mcg Oral Daily  . gabapentin  100 mg Oral QID  . melatonin  5 mg Oral QHS  . pantoprazole  40 mg Oral Daily    Assessment: Patient w/ h/o CKD, RA s/p knee replacement on 08/2019 arrives w/ c/o SOB and weakness, CXR clear, initial trop 615, EKG showing QTc  prolongation of 527 w/ ST abnormalities in III, aVF, V3, patient was on lovenox subq PTA for knee replacement; however, patient states she has not taken this since March of 2021 so no other anticoagulation PTA recently. Baseline CBC WNL. Baseline aPTT/anti-Xa/INR drawn after start of infusion. Patient is being started on heparin drip for management of NSTEMI.  08/13 @ 2300 patient was having hemoptysis w/ CT showing pneumonia vs. Pulmonary hemorrhage, patient is s/p thrombectomy x pulmonary embolism w/ thrombolysis w/ tPA. Heparin was placed on hold d/t hemoptysis.  08/14 pharmacy consulted to dose heparin for PE 8/14 1139 Heparin 1000 units/hr restarted 8/14 1757 HL 0.37, therapeutic x 1  Goal of Therapy:  Heparin level 0.3-0.7 units/ml Monitor platelets by anticoagulation protocol: Yes   Plan:  08/15 @ 0039 HL 0.30 therapeutic, but trending down. Will increase rate slightly to 1050 units/hr and will recheck HL at 0900, CBC trending down will continue to monitor.  Tobie Lords, PharmD, BCPS Clinical Pharmacist 03/12/2020 1:23 AM

## 2020-03-12 NOTE — Progress Notes (Signed)
ANTICOAGULATION CONSULT NOTE  Pharmacy Consult for heparin Indication: pulmonary embolism   Patient Measurements: Height: 5\' 5"  (165.1 cm) Weight: 106.6 kg (235 lb 1.6 oz) IBW/kg (Calculated) : 57 Heparin Dosing Weight: 82 kg  Labs: Recent Labs    03/10/20 0104 03/10/20 0104 03/10/20 2313 03/10/20 2313 03/11/20 1238 03/11/20 1757 03/12/20 0039 03/12/20 0858  HGB 11.0*   < > 11.0*   < > 9.4*  --  9.5*  --   HCT 32.6*   < > 34.7*  --  29.7*  --  28.8*  --   PLT 125*   < > 122*  --  103*  --  101*  --   HEPARINUNFRC 0.46   < >  --   --   --  0.37 0.30 0.43  CREATININE 1.82*  --   --   --  2.75*  --  2.21*  --    < > = values in this interval not displayed.    Estimated Creatinine Clearance: 28.3 mL/min (A) (by C-G formula based on SCr of 2.21 mg/dL (H)).   Medical History: Past Medical History:  Diagnosis Date  . Anemia    vitamin b and d deficiency  . Anxiety   . Arthritis    osteoarthrits  . Chronic pain syndrome   . CKD (chronic kidney disease) stage 3, GFR 30-59 ml/min   . Complication of anesthesia    difficulty waking up  . DDD (degenerative disc disease), cervical   . Fibromyalgia    not accurate, per patient  . GERD (gastroesophageal reflux disease)   . Gout   . Hearing loss   . History of kidney stones 1987  . Hypertension   . Rheumatoid arthritis (HCC)    orencia infusions  . Vitamin B12 deficiency   . Vitamin D deficiency     Medications:  Scheduled:  . allopurinol  300 mg Oral Daily  . folic acid  244 mcg Oral Daily  . gabapentin  100 mg Oral QID  . melatonin  5 mg Oral QHS  . pantoprazole  40 mg Oral Daily    Assessment: Patient w/ h/o CKD, RA s/p knee replacement on 08/2019 arrives w/ c/o SOB and weakness, CXR clear, initial trop 615, EKG showing QTc prolongation of 527 w/ ST abnormalities in III, aVF, V3, patient was on lovenox subq PTA for knee replacement; however, patient states she has not taken this since March of 2021 so no other  anticoagulation PTA recently. Baseline CBC WNL. Baseline aPTT/anti-Xa/INR drawn after start of infusion. Patient is being started on heparin drip for management of NSTEMI.  08/13 @ 2300 patient was having hemoptysis w/ CT showing pneumonia vs. Pulmonary hemorrhage, patient is s/p thrombectomy x pulmonary embolism w/ thrombolysis w/ tPA. Heparin was placed on hold d/t hemoptysis.  08/14 pharmacy consulted to dose heparin for PE 8/14 1139 Heparin 1000 units/hr restarted 8/14 1757 HL 0.37, therapeutic x 1 8/15 0039 HL 0.30, therapeutic trending down - rate increased to 1050 units/hr 8/15 0858 HL 0.43, therapeutic x 1  Goal of Therapy:  Heparin level 0.3-0.7 units/ml Monitor platelets by anticoagulation protocol: Yes   Plan:  08/15 @ 0858 HL 0.43 therapeutic. Will continue rate of 1050 units/hr and will recheck HL at 1700. CBC trending down will continue to monitor.  Benn Moulder, PharmD Pharmacy Resident  03/12/2020 10:52 AM

## 2020-03-12 NOTE — Progress Notes (Signed)
   03/12/20 1715  Assess: MEWS Score  Temp 99.6 F (37.6 C)  BP (!) 131/58  Pulse Rate (!) 121  Resp 18  SpO2 94 %  O2 Device Nasal Cannula  O2 Flow Rate (L/min) 2 L/min  Assess: MEWS Score  MEWS Temp 0  MEWS Systolic 0  MEWS Pulse 2  MEWS RR 0  MEWS LOC 0  MEWS Score 2  MEWS Score Color Yellow  Assess: if the MEWS score is Yellow or Red  Were vital signs taken at a resting state? Yes  Focused Assessment Change from prior assessment (see assessment flowsheet)  Early Detection of Sepsis Score *See Row Information* Low  MEWS guidelines implemented *See Row Information* Yes  Treat  MEWS Interventions Administered scheduled meds/treatments;Escalated (See documentation below)  Take Vital Signs  Increase Vital Sign Frequency  Yellow: Q 2hr X 2 then Q 4hr X 2, if remains yellow, continue Q 4hrs  Notify: Charge Nurse/RN  Name of Charge Nurse/RN Notified Colletta Maryland  Date Charge Nurse/RN Notified 03/12/20  Time Charge Nurse/RN Notified 1730  Notify: Provider  Provider Name/Title Dr. Princess Bruins  Date Provider Notified 03/12/20  Time Provider Notified 1740  Notification Type Page  Notification Reason Change in status  Response See new orders  Document  Patient Outcome Other (Comment) (closely monitoring)  Progress note created (see row info) Yes   Pt MEWs changed to Yellow. Pt has been tachycardic HR at 127's sustaining. Dr. Rockey Situ and Dr. Nevada Crane notified. PO labetalol ordered. Will give and continue to monitor.

## 2020-03-12 NOTE — Progress Notes (Signed)
Updated the patient's daughter Andee Poles via phone.  All questions answered.

## 2020-03-12 NOTE — Evaluation (Addendum)
Physical Therapy Evaluation Patient Details Name: Sabrina Green MRN: [id number] DOB: 01-28-1946 Today's Date: 03/12/2020   History of Present Illness  74 year old woman with chronic kidney disease stage III, rheumatoid arthritis, morbid obesity, fibromyalgia presenting with shortness of breath, lightheadedness, diaphoresis, chest tightness, bilateral PE on CT scan. Status post thrombolysis with TPA right pulmonary artery, left pulmonary artery, mechanical thrombectomy left upper lobe and left lower lobe pulmonary arteries also with thrombectomy right middle lobe and right lower lobe on 03/10/20.    Clinical Impression  PT evaluation completed. Patient sitting up in chair on arrival to room with daughter at bedside. Patient required Min guard assistance for transfers, including sit to stand from recliner, and stand pivot from recliner to bed side commode. Patient is able to stand with close stand by assistance without assistive device and no loss of balance, however has activity tolerance limited by fatigue and dizziness in standing. Unable to stand long enough for blood pressure reading in standing. Heart rate 131 bpm and Sp02 90%. Patient does report feeling some anxiety when she attempts mobilizing. Patient participated in seated LE therapeutic exercises and encouraged exercise as able.  Unable to progress ambulation at this time due to poor standing tolerance. Recommend PT to address functional limitations listed below. Patient wants to be discharged home with support from her family when medically ready. Recommend HHPT and intermittently family supervision as needed.     Follow Up Recommendations Home health PT    Equipment Recommendations    None at this time   Recommendations for Other Services       Precautions / Restrictions Precautions Precautions: Fall Restrictions Weight Bearing Restrictions: No      Mobility  Bed Mobility               General bed mobility comments:  not assessed this session   Transfers Overall transfer level: Needs assistance   Transfers: Sit to/from Stand;Stand Pivot Transfers Sit to Stand: Min guard Stand pivot transfers: Min guard       General transfer comment: verbal cues for safe hand placement. Min gaurd for safety   Ambulation/Gait             General Gait Details: Unable to progress ambulation due to poor standing tolerance. unable to stand long enough for blood pressure measurement. heart rate 131bpm with standing, Sp02 90%. Patient feels week and dizzy in standing position   Stairs            Wheelchair Mobility    Modified Rankin (Stroke Patients Only)       Balance Overall balance assessment: Needs assistance Sitting-balance support: Feet supported Sitting balance-Leahy Scale: Good     Standing balance support: No upper extremity supported Standing balance-Leahy Scale: Fair Standing balance comment: no UE supported on rolling walker                              Pertinent Vitals/Pain Pain Assessment: No/denies pain    Home Living Family/patient expects to be discharged to:: Private residence Living Arrangements: Alone Available Help at Discharge: Family;Available PRN/intermittently Type of Home: House Home Access: Level entry     Home Layout: One level Home Equipment: Cane - single point;Walker - 2 wheels      Prior Function Level of Independence: Independent with assistive device(s)         Comments: patient reports using a cane intermittently. patient has reported multiple falls this year  Hand Dominance        Extremity/Trunk Assessment   Upper Extremity Assessment Upper Extremity Assessment: Overall WFL for tasks assessed    Lower Extremity Assessment Lower Extremity Assessment: Generalized weakness       Communication   Communication: No difficulties  Cognition Arousal/Alertness: Awake/alert Behavior During Therapy: WFL for tasks  assessed/performed Overall Cognitive Status: Within Functional Limits for tasks assessed                                        General Comments      Exercises General Exercises - Lower Extremity Ankle Circles/Pumps: AROM;Strengthening;Both;10 reps;Seated Heel Slides: Strengthening;Both;10 reps;[facility] Other Exercises Other Exercises: verbal cues for technique    Assessment/Plan    PT Assessment Patient needs continued PT services  PT Problem List Decreased strength;Decreased activity tolerance;Decreased balance;Decreased mobility;Cardiopulmonary status limiting activity;Decreased safety awareness       PT Treatment Interventions Gait training;DME instruction;Stair training;Functional mobility training;Therapeutic activities;Therapeutic exercise;Neuromuscular re-education;Patient/family education    PT Goals (Current goals can be found in the Care Plan section)  Acute Rehab PT Goals Patient Stated Goal: to walk safely and go home  PT Goal Formulation: With patient Potential to Achieve Goals: Good    Frequency Min 2X/week   Barriers to discharge        Co-evaluation               AM-PAC PT "6 Clicks" Mobility  Outcome Measure Help needed turning from your back to your side while in a flat bed without using bedrails?: A Little Help needed moving from lying on your back to sitting on the side of a flat bed without using bedrails?: A Little Help needed moving to and from a bed to a chair (including a wheelchair)?: A Little Help needed standing up from a chair using your arms (e.g., wheelchair or bedside chair)?: A Little Help needed to walk in hospital room?: A Little Help needed climbing 3-5 steps with a railing? : A Little 6 Click Score: 18    End of Session Equipment Utilized During Treatment: Oxygen Activity Tolerance: Patient limited by fatigue Patient left: in chair;with call bell/phone within reach;with family/visitor present Nurse  Communication:  (white board up to date with mobility status ) PT Visit Diagnosis: Unsteadiness on feet (R26.81);Muscle weakness (generalized) (M62.81)    Time: 1310-1340 PT Time Calculation (min) (ACUTE ONLY): 30 min   Charges:   PT Evaluation $PT Eval Moderate Complexity: 1 Mod PT Treatments $Therapeutic Exercise: 8-22 mins        Minna Merritts, PT, MPT   Percell Locus 03/12/2020, 1:54 PM

## 2020-03-12 NOTE — Plan of Care (Signed)
°  Problem: Education: Goal: Knowledge of General Education information will improve Description: Including pain rating scale, medication(s)/side effects and non-pharmacologic comfort measures 03/12/2020 0230 by Christie Nottingham, RN Outcome: Progressing 03/12/2020 0230 by Christie Nottingham, RN Outcome: Progressing

## 2020-03-12 NOTE — Progress Notes (Signed)
ANTICOAGULATION CONSULT NOTE  Pharmacy Consult for heparin Indication: pulmonary embolism   Patient Measurements: Height: 5\' 5"  (165.1 cm) Weight: 106.6 kg (235 lb 1.6 oz) IBW/kg (Calculated) : 57 Heparin Dosing Weight: 82 kg  Labs: Recent Labs    03/10/20 0104 03/10/20 0104 03/10/20 2313 03/10/20 2313 03/11/20 1238 03/11/20 1757 03/12/20 0039 03/12/20 0858 03/12/20 1700  HGB 11.0*   < > 11.0*   < > 9.4*  --  9.5*  --   --   HCT 32.6*   < > 34.7*  --  29.7*  --  28.8*  --   --   PLT 125*   < > 122*  --  103*  --  101*  --   --   HEPARINUNFRC 0.46  --   --   --   --    < > 0.30 0.43 0.51  CREATININE 1.82*  --   --   --  2.75*  --  2.21*  --   --    < > = values in this interval not displayed.    Estimated Creatinine Clearance: 28.3 mL/min (A) (by C-G formula based on SCr of 2.21 mg/dL (H)).   Medical History: Past Medical History:  Diagnosis Date  . Anemia    vitamin b and d deficiency  . Anxiety   . Arthritis    osteoarthrits  . Chronic pain syndrome   . CKD (chronic kidney disease) stage 3, GFR 30-59 ml/min   . Complication of anesthesia    difficulty waking up  . DDD (degenerative disc disease), cervical   . Fibromyalgia    not accurate, per patient  . GERD (gastroesophageal reflux disease)   . Gout   . Hearing loss   . History of kidney stones 1987  . Hypertension   . Rheumatoid arthritis (HCC)    orencia infusions  . Vitamin B12 deficiency   . Vitamin D deficiency     Medications:  Scheduled:  . allopurinol  300 mg Oral Daily  . atorvastatin  40 mg Oral Daily  . folic acid  423 mcg Oral Daily  . furosemide  20 mg Intravenous Daily  . gabapentin  100 mg Oral QID  . labetalol  100 mg Oral BID  . melatonin  5 mg Oral QHS  . pantoprazole  40 mg Oral Daily    Assessment: Patient w/ h/o CKD, RA s/p knee replacement on 08/2019 arrives w/ c/o SOB and weakness, CXR clear, initial trop 615, EKG showing QTc prolongation of 527 w/ ST abnormalities in  III, aVF, V3, patient was on lovenox subq PTA for knee replacement; however, patient states she has not taken this since March of 2021 so no other anticoagulation PTA recently. Baseline CBC WNL. Baseline aPTT/anti-Xa/INR drawn after start of infusion. Patient is being started on heparin drip for management of NSTEMI.  08/13 @ 2300 patient was having hemoptysis w/ CT showing pneumonia vs. Pulmonary hemorrhage, patient is s/p thrombectomy x pulmonary embolism w/ thrombolysis w/ tPA. Heparin was placed on hold d/t hemoptysis.  08/14 pharmacy consulted to dose heparin for PE 8/14 1139 Heparin 1000 units/hr restarted 8/14 1757 HL 0.37, therapeutic x 1 8/15 0039 HL 0.30, therapeutic trending down - rate increased to 1050 units/hr 8/15 0858 HL 0.43, therapeutic x 1 8/15 1700 HL 0.51, therapeutic x 2  Goal of Therapy:  Heparin level 0.3-0.7 units/ml Monitor platelets by anticoagulation protocol: Yes   Plan:  08/15 @ 1700 HL 0.51 therapeutic x 2. Will continue  rate of 1050 units/hr and will recheck HL tomorrow AM. CBC trending down will continue to monitor.  Sabrina Green 03/12/2020 5:35 PM

## 2020-03-12 NOTE — Progress Notes (Signed)
Progress Note  Patient Name: Sabrina Green Date of Encounter: 03/12/2020  [facility] Cardiologist: new to [facility]  Subjective   Underwent thrombolysis with TPA right pulmonary artery, left pulmonary artery, mechanical thrombectomy left upper lobe and left lower lobe pulmonary arteries also with thrombectomy right middle lobe and right lower lobe  Had a better night last night, some difficulty obtaining saturations, has fingernail polish Using her feet but this is not consistent Denies having significant shortness of breath at rest On standing reports she has had several episodes of flushing, /lightheadedness Blood pressures stable while resting 009 up to 233 systolic in a 45 degree position So far no orthostatic measurements obtained, discussed with nurses there pending   Inpatient Medications    Scheduled Meds: . allopurinol  300 mg Oral Daily  . atorvastatin  40 mg Oral Daily  . folic acid  007 mcg Oral Daily  . gabapentin  100 mg Oral QID  . melatonin  5 mg Oral QHS  . pantoprazole  40 mg Oral Daily   Continuous Infusions: . heparin 1,050 Units/hr (03/12/20 1221)   PRN Meds: acetaminophen, busPIRone, HYDROmorphone (DILAUDID) injection, ondansetron (ZOFRAN) IV, oxyCODONE, tiZANidine   Vital Signs    Vitals:   03/11/20 2110 03/12/20 0356 03/12/20 0759 03/12/20 1122  BP:  (!) 106/50 (!) 129/58 (!) 133/53  Pulse:  (!) 111 93 100  Resp:   18 18  Temp:  98.7 F (37.1 C) 97.8 F (36.6 C) 98.3 F (36.8 C)  TempSrc:  Oral Oral Oral  SpO2: 100% 100% 100% 99%  Weight:  106.6 kg    Height:        Intake/Output Summary (Last 24 hours) at 03/12/2020 1257 Last data filed at 03/12/2020 1000 Gross per 24 hour  Intake 540.86 ml  Output 1800 ml  Net -1259.14 ml   Last 3 Weights 03/12/2020 03/11/2020 03/09/2020  Weight (lbs) 235 lb 1.6 oz 235 lb 14.3 oz 235 lb 14.4 oz  Weight (kg) 106.641 kg 107 kg 107.004 kg      Telemetry    Normal sinus rhythm- Personally  Reviewed  ECG     - Personally Reviewed  Physical Exam   Constitutional:  oriented to person, place, and time. No distress.  HENT:  Head: Grossly normal Eyes:  no discharge. No scleral icterus.  Neck: No JVD, no carotid bruits  Cardiovascular: Regular rate and rhythm, no murmurs appreciated Pulmonary/Chest: Clear to auscultation bilaterally, no wheezes or rails Abdominal: Soft.  no distension.  no tenderness.  Musculoskeletal: Normal range of motion Neurological:  normal muscle tone. Coordination normal. No atrophy Skin: Skin warm and dry Psychiatric: normal affect, pleasant  Labs    High Sensitivity Troponin:   Recent Labs  Lab 03/08/20 1957 03/08/20 2117 03/08/20 2328 03/09/20 0214 03/09/20 0557  TROPONINIHS 615* 625* 714* 593* 515*      Chemistry Recent Labs  Lab 03/10/20 0104 03/11/20 1238 03/12/20 0039  NA 140 138 143  K 4.0 4.3 3.8  CL 107 105 109  CO2 22 23 21*  GLUCOSE 134* 111* 140*  BUN 37* 38* 41*  CREATININE 1.82* 2.75* 2.21*  CALCIUM 8.7* 8.9 8.8*  GFRNONAA 27* 17* 22*  GFRAA 32* 19* 25*  ANIONGAP 11 10 13      Hematology Recent Labs  Lab 03/10/20 2313 03/11/20 1238 03/12/20 0039  WBC 8.4 7.9 8.8  RBC 3.80* 3.26* 3.27*  HGB 11.0* 9.4* 9.5*  HCT 34.7* 29.7* 28.8*  MCV 91.3 91.1 88.1  MCH 28.9 28.8 29.1  MCHC 31.7 31.6 33.0  RDW 15.6* 15.8* 15.8*  PLT 122* 103* 101*    BNPNo results for input(s): BNP, PROBNP in the last 168 hours.   DDimer No results for input(s): DDIMER in the last 168 hours.   Radiology    CT CHEST WO CONTRAST  Result Date: 03/11/2020 CLINICAL DATA:  Recent pulmonary embolus status post thrombectomy/tPA. Ground-glass opacities and concern for hemorrhage. EXAM: CT CHEST WITHOUT CONTRAST TECHNIQUE: Multidetector CT imaging of the chest was performed following the standard protocol without IV contrast. COMPARISON:  Radiograph yesterday.  Chest CTA 03/09/2020 FINDINGS: Cardiovascular: Aortic atherosclerosis. No  aortic aneurysm. Coronary artery calcifications/stent. No pericardial effusion. Mediastinum/Nodes: No enlarged mediastinal lymph nodes. Limited assessment for hilar adenopathy on noncontrast exam. Right thyroid calcification without evidence of nodule. No esophageal wall thickening. There is a tiny hiatal hernia. Lungs/Pleura: Peribronchovascular ground-glass opacities in the superior segment of both lower lobes, right middle lobe, as well as throughout the dependent left greater than right upper lobe. Minimal superimposed consolidation in the left upper lobe. Minimal associated septal thickening in the left upper lobe. No pneumothorax or pleural effusion. Trachea and central bronchi are patent. Upper Abdomen: Cholecystectomy. There is excreted IV contrast in the kidneys or renal collecting system, likely from prior thrombectomy. Musculoskeletal: There are no acute or suspicious osseous abnormalities. IMPRESSION: 1. Ground-glass opacities within both lungs, more prominent on the left involving the upper lobe. Favor pulmonary hemorrhage given history of recent pulmonary tPA. Possibility of asymmetric edema is also considered but felt less likely. 2. Coronary artery calcifications/stents. Aortic Atherosclerosis (ICD10-I70.0). Electronically Signed   By: Keith Rake M.D.   On: 03/11/2020 00:54   PERIPHERAL VASCULAR CATHETERIZATION  Result Date: 03/10/2020 See op note  DG Chest Port 1 View  Result Date: 03/10/2020 CLINICAL DATA:  Hypoxia EXAM: PORTABLE CHEST 1 VIEW COMPARISON:  03/08/2020, CT chest 03/09/2020 FINDINGS: Interim development of ground-glass opacity within the left chest most notable in the upper lobe. Borderline cardiomegaly. Aortic atherosclerosis. No pneumothorax. IMPRESSION: Interim development of ground-glass opacity within the left chest most notable in the upper lobe; findings could be secondary to pneumonia, hemorrhage, or atypical appearance of edema. Electronically Signed   By: Donavan Foil M.D.   On: 03/10/2020 21:57    Cardiac Studies   Echo 1. Left ventricular ejection fraction, by estimation, is 55 to 60%. The  left ventricle has normal function. The left ventricle has no regional  wall motion abnormalities. Left ventricular diastolic parameters are  consistent with Grade I diastolic  dysfunction (impaired relaxation).  2. Right ventricular systolic function is mildly reduced. The right  ventricular size is severely enlarged. There is severely elevated  pulmonary artery systolic pressure. The estimated right ventricular  systolic pressure is 34.1 mmHg.  3. Right atrial size was moderately dilated.   CT scan chest IMPRESSION: Positive for acute PE with of right heart strain (RV/LV Ratio = 1.5) consistent with at least submassive (intermediate risk) PE. The presence of right heart strain has been associated with an increased risk of morbidity and mortality.   Patient Profile     Ms. Hege is a 74 year old woman with chronic kidney disease stage III, rheumatoid arthritis, morbid obesity, fibromyalgia presenting with shortness of breath, lightheadedness, diaphoresis, chest tightness, bilateral PE on CT scan  Assessment & Plan    1.  Bilateral pulmonary embolism with right heart strain Abnormal EKG, respiratory distress, elevated troponin on arrival -Lower extremity venous Doppler with popliteal DVT  on right -Underwent TPA with mechanical thrombectomy multiple lobes -Initial hypoxia overnight, possible hemorrhage into the lung, heparin temporarily held and restarted without bolus 8 hours later -tolerating heparin infusion, hematocrit stable, no further desaturations -If stable through the weekend would look to transition to Eliquis 10 mg twice daily for 7 days then 5 mg twice daily  2.  Lightheadedness Will check orthostatics, check ambulatory saturations Sedentary at baseline, very deconditioned Consider PT evaluation, may need rehab if weakness  persists  3 morbid obesity Very sedentary at home, sits and watches TV likely cause of her DVT right leg Stressed importance of lifestyle modification after she has recovered  3.  Chronic kidney disease Creatinine 1.8, on arrival Increasing creatinine likely secondary to ATN, contrast load Now improving  Acute respiratory distress In the setting of bilateral PE May need Lasix IV as needed for any worsening shortness of breath We will hold off now given renal function above her baseline  Case discussed with hospitalist service, family at the bedside, patient and nursing Orthostatics requested, ambulatory saturations requested  Total encounter time more than 35 minutes  Greater than 50% was spent in counseling and coordination of care with the patient   For questions or updates, please contact [place] Please consult [email or web address] for contact info under        Signed, Ida Rogue, MD  03/12/2020, 12:57 PM

## 2020-03-12 NOTE — Plan of Care (Signed)

## 2020-03-13 DIAGNOSIS — I248 Other forms of acute ischemic heart disease: Secondary | ICD-10-CM

## 2020-03-13 LAB — BASIC METABOLIC PANEL
Anion gap: 14 (ref 5–15)
BUN: 35 mg/dL — ABNORMAL HIGH (ref 8–23)
CO2: 21 mmol/L — ABNORMAL LOW (ref 22–32)
Calcium: 9.1 mg/dL (ref 8.9–10.3)
Chloride: 104 mmol/L (ref 98–111)
Creatinine, Ser: 1.94 mg/dL — ABNORMAL HIGH (ref 0.44–1.00)
GFR calc Af Amer: 29 mL/min — ABNORMAL LOW (ref >60)
GFR calc non Af Amer: 25 mL/min — ABNORMAL LOW (ref >60)
Glucose, Bld: 120 mg/dL — ABNORMAL HIGH (ref 70–99)
Potassium: 3.9 mmol/L (ref 3.5–5.1)
Sodium: 139 mmol/L (ref 135–145)

## 2020-03-13 LAB — CBC
HCT: 30.7 % — ABNORMAL LOW (ref 36.0–46.0)
Hemoglobin: 10.1 g/dL — ABNORMAL LOW (ref 12.0–15.0)
MCH: 29.5 pg (ref 26.0–34.0)
MCHC: 32.9 g/dL (ref 30.0–36.0)
MCV: 89.8 fL (ref 80.0–100.0)
Platelets: 102 10*3/uL — ABNORMAL LOW (ref 150–400)
RBC: 3.42 MIL/uL — ABNORMAL LOW (ref 3.87–5.11)
RDW: 16.5 % — ABNORMAL HIGH (ref 11.5–15.5)
WBC: 9 10*3/uL (ref 4.0–10.5)
nRBC: 0 % (ref 0.0–0.2)

## 2020-03-13 LAB — HEPARIN LEVEL (UNFRACTIONATED): Heparin Unfractionated: 0.63 IU/mL (ref 0.30–0.70)

## 2020-03-13 LAB — MAGNESIUM: Magnesium: 1.6 mg/dL — ABNORMAL LOW (ref 1.7–2.4)

## 2020-03-13 MED ORDER — LABETALOL HCL 100 MG PO TABS: 100.0000 mg | ORAL_TABLET | Freq: Two times a day (BID) | ORAL | 0 refills | Status: DC

## 2020-03-13 MED ORDER — APIXABAN (ELIQUIS) VTE STARTER PACK (10MG AND 5MG)
ORAL_TABLET | ORAL | 0 refills | Status: DC
Start: 2020-03-13 — End: 2020-04-26

## 2020-03-13 MED ORDER — ATORVASTATIN CALCIUM 40 MG PO TABS: 40.0000 mg | ORAL_TABLET | Freq: Every day | ORAL | 0 refills | Status: DC

## 2020-03-13 MED ORDER — GABAPENTIN 100 MG PO CAPS: 100.0000 mg | ORAL_CAPSULE | Freq: Three times a day (TID) | ORAL | 0 refills | Status: DC

## 2020-03-13 MED ORDER — APIXABAN 5 MG PO TABS
10.0000 mg | ORAL_TABLET | Freq: Two times a day (BID) | ORAL | Status: DC
  Administered 2020-03-13: 10 mg via ORAL
  Filled 2020-03-13: qty 2

## 2020-03-13 MED ORDER — APIXABAN 5 MG PO TABS: 5.0000 mg | ORAL_TABLET | Freq: Two times a day (BID) | ORAL | Status: DC

## 2020-03-13 NOTE — Care Management Important Message (Signed)
Important Message  Patient Details  Name: Sabrina Green MRN: [id number] Date of Birth: [birth year]   Medicare Important Message Given:  Yes     Loann Quill 03/13/2020, 11:44 AM

## 2020-03-13 NOTE — Discharge Instructions (Signed)
Deep Vein Thrombosis  Deep vein thrombosis (DVT) is a condition in which a blood clot forms in a deep vein, such as a lower leg, thigh, or arm vein. A clot is blood that has thickened into a gel or solid. This condition is dangerous. It can lead to serious and even life-threatening complications if the clot travels to the lungs and causes a blockage (pulmonary embolism). It can also damage veins in the leg. This can result in leg pain, swelling, discoloration, and sores (post-thrombotic syndrome). What are the causes? This condition may be caused by:  A slowdown of blood flow.  Damage to a vein.  A condition that causes blood to clot more easily, such as an inherited clotting disorder. What increases the risk? The following factors may make you more likely to develop this condition:  Being overweight.  Being older, especially over age 60.  Sitting or lying down for more than four hours.  Being in the hospital.  Lack of physical activity (sedentary lifestyle).  Pregnancy, being in childbirth, or having recently given birth.  Taking medicines that contain estrogen, such as medicines to prevent pregnancy.  Smoking.  A history of any of the following: ? Blood clots or a blood clotting disease. ? Peripheral vascular disease. ? Inflammatory bowel disease. ? Cancer. ? Heart disease. ? Genetic conditions that affect how your blood clots, such as Factor V Leiden mutation. ? Neurological diseases that affect your legs (leg paresis). ? A recent injury, such as a car accident. ? Major or lengthy surgery. ? A central line placed inside a large vein. What are the signs or symptoms? Symptoms of this condition include:  Swelling, pain, or tenderness in an arm or leg.  Warmth, redness, or discoloration in an arm or leg. If the clot is in your leg, symptoms may be more noticeable or worse when you stand or walk. Some people may not develop any symptoms. How is this diagnosed? This  condition is diagnosed with:  A medical history and physical exam.  Tests, such as: ? Blood tests. These are done to check how well your blood clots. ? Ultrasound. This is done to check for clots. ? Venogram. For this test, contrast dye is injected into a vein and X-rays are taken to check for any clots. How is this treated? Treatment for this condition depends on:  The cause of your DVT.  Your risk for bleeding or developing more clots.  Any other medical conditions that you have. Treatment may include:  Taking a blood thinner (anticoagulant). This type of medicine prevents clots from forming. It may be taken by mouth, injected under the skin, or injected through an IV (catheter).  Injecting clot-dissolving medicines into the affected vein (catheter-directed thrombolysis).  Having surgery. Surgery may be done to: ? Remove the clot. ? Place a filter in a large vein to catch blood clots before they reach the lungs. Some treatments may be continued for up to six months. Follow these instructions at home: If you are taking blood thinners:  Take the medicine exactly as told by your health care provider. Some blood thinners need to be taken at the same time every day. Do not skip a dose.  Talk with your health care provider before you take any medicines that contain aspirin or NSAIDs. These medicines increase your risk for dangerous bleeding.  Ask your health care provider about foods and drugs that could change the way the medicine works (may interact). Avoid those things if your   health care provider tells you to do so.  Blood thinners can cause easy bruising and may make it difficult to stop bleeding. Because of this: ? Be very careful when using knives, scissors, or other sharp objects. ? Use an electric razor instead of a blade. ? Avoid activities that could cause injury or bruising, and follow instructions about how to prevent falls.  Wear a medical alert bracelet or carry a  card that lists what medicines you take. General instructions  Take over-the-counter and prescription medicines only as told by your health care provider.  Return to your normal activities as told by your health care provider. Ask your health care provider what activities are safe for you.  Wear compression stockings if recommended by your health care provider.  Keep all follow-up visits as told by your health care provider. This is important. How is this prevented? To lower your risk of developing this condition again:  For 30 or more minutes every day, do an activity that: ? Involves moving your arms and legs. ? Increases your heart rate.  When traveling for longer than four hours: ? Exercise your arms and legs every hour. ? Drink plenty of water. ? Avoid drinking alcohol.  Avoid sitting or lying for a long time without moving your legs.  If you have surgery or you are hospitalized, ask about ways to prevent blood clots. These may include taking frequent walks or using anticoagulants.  Stay at a healthy weight.  If you are a woman who is older than age 57, avoid unnecessary use of medicines that contain estrogen, such as some birth control pills.  Do not use any products that contain nicotine or tobacco, such as cigarettes and e-cigarettes. This is especially important if you take estrogen medicines. If you need help quitting, ask your health care provider. Contact a health care provider if:  You miss a dose of your blood thinner.  Your menstrual period is heavier than usual.  You have unusual bruising. Get help right away if:  You have: ? New or increased pain, swelling, or redness in an arm or leg. ? Numbness or tingling in an arm or leg. ? Shortness of breath. ? Chest pain. ? A rapid or irregular heartbeat. ? A severe headache or confusion. ? A cut that will not stop bleeding.  There is blood in your vomit, stool, or urine.  You have a serious fall or accident,  or you hit your head.  You feel light-headed or dizzy.  You cough up blood. These symptoms may represent a serious problem that is an emergency. Do not wait to see if the symptoms will go away. Get medical help right away. Call your local emergency services (911 in the [place] Do not drive yourself to the hospital. Summary  Deep vein thrombosis (DVT) is a condition in which a blood clot forms in a deep vein, such as a lower leg, thigh, or arm vein.  Symptoms can include swelling, warmth, pain, and redness in your leg or arm.  This condition may be treated with a blood thinner (anticoagulant medicine), medicine that is injected to dissolve blood clots,compression stockings, or surgery.  If you are prescribed blood thinners, take them exactly as told. This information is not intended to replace advice given to you by your health care provider. Make sure you discuss any questions you have with your health care provider. Document Revised: 06/27/2017 Document Reviewed: 12/13/2016 Elsevier Patient Education  [place]. Pulmonary Embolism  A  pulmonary embolism (PE) is a sudden blockage or decrease of blood flow in one or both lungs. Most blockages come from a blood clot that forms in the vein of a lower leg, thigh, or arm (deep vein thrombosis, DVT) and travels to the lungs. A clot is blood that has thickened into a gel or solid. PE is a dangerous and life-threatening condition that needs to be treated right away. What are the causes? This condition is usually caused by a blood clot that forms in a vein and moves to the lungs. In rare cases, it may be caused by air, fat, part of a tumor, or other tissue that moves through the veins and into the lungs. What increases the risk? The following factors may make you more likely to develop this condition:  Experiencing a traumatic injury, such as breaking a hip or leg.  Having: ? A spinal cord injury. ? Orthopedic surgery, especially hip or  knee replacement. ? Any major surgery. ? A stroke. ? DVT. ? Blood clots or blood clotting disease. ? Long-term (chronic) lung or heart disease. ? Cancer treated with chemotherapy. ? A central venous catheter.  Taking medicines that contain estrogen. These include birth control pills and hormone replacement therapy.  Being: ? Pregnant. ? In the period of time after your baby is delivered (postpartum). ? Older than age 32. ? Overweight. ? A smoker, especially if you have other risks. What are the signs or symptoms? Symptoms of this condition usually start suddenly and include:  Shortness of breath during activity or at rest.  Coughing, coughing up blood, or coughing up blood-tinged mucus.  Chest pain that is often worse with deep breaths.  Rapid or irregular heartbeat.  Feeling light-headed or dizzy.  Fainting.  Feeling anxious.  Fever.  Sweating.  Pain and swelling in a leg. This is a symptom of DVT, which can lead to PE. How is this diagnosed? This condition may be diagnosed based on:  Your medical history.  A physical exam.  Blood tests.  CT pulmonary angiogram. This test checks blood flow in and around your lungs.  Ventilation-perfusion scan, also called a lung VQ scan. This test measures air flow and blood flow to the lungs.  An ultrasound of the legs. How is this treated? Treatment for this condition depends on many factors, such as the cause of your PE, your risk for bleeding or developing more clots, and other medical conditions you have. Treatment aims to remove, dissolve, or stop blood clots from forming or growing larger. Treatment may include:  Medicines, such as: ? Blood thinning medicines (anticoagulants) to stop clots from forming. ? Medicines that dissolve clots (thrombolytics).  Procedures, such as: ? Using a flexible tube to remove a blood clot (embolectomy) or to deliver medicine to destroy it (catheter-directed thrombolysis). ? Inserting  a filter into a large vein that carries blood to the heart (inferior vena cava). This filter (vena cava filter) catches blood clots before they reach the lungs. ? Surgery to remove the clot (surgical embolectomy). This is rare. You may need a combination of immediate, long-term (up to 3 months after diagnosis), and extended (more than 3 months after diagnosis) treatments. Your treatment may continue for several months (maintenance therapy). You and your health care provider will work together to choose the treatment program that is best for you. Follow these instructions at home: Medicines  Take over-the-counter and prescription medicines only as told by your health care provider.  If you are taking an  anticoagulant medicine: ? Take the medicine every day at the same time each day. ? Understand what foods and drugs interact with your medicine. ? Understand the side effects of this medicine, including excessive bruising or bleeding. Ask your health care provider or pharmacist about other side effects. General instructions  Wear a medical alert bracelet or carry a medical alert card that says you have had a PE and lists what medicines you take.  Ask your health care provider when you may return to your normal activities. Avoid sitting or lying for a long time without moving.  Maintain a healthy weight. Ask your health care provider what weight is healthy for you.  Do not use any products that contain nicotine or tobacco, such as cigarettes, e-cigarettes, and chewing tobacco. If you need help quitting, ask your health care provider.  Talk with your health care provider about any travel plans. It is important to make sure that you are still able to take your medicine while on trips.  Keep all follow-up visits as told by your health care provider. This is important. Contact a health care provider if:  You missed a dose of your blood thinner medicine. Get help right away if:  You have: ? New  or increased pain, swelling, warmth, or redness in an arm or leg. ? Numbness or tingling in an arm or leg. ? Shortness of breath during activity or at rest. ? A fever. ? Chest pain. ? A rapid or irregular heartbeat. ? A severe headache. ? Vision changes. ? A serious fall or accident, or you hit your head. ? Stomach (abdominal) pain. ? Blood in your vomit, stool, or urine. ? A cut that will not stop bleeding.  You cough up blood.  You feel light-headed or dizzy.  You cannot move your arms or legs.  You are confused or have memory loss. These symptoms may represent a serious problem that is an emergency. Do not wait to see if the symptoms will go away. Get medical help right away. Call your local emergency services (911 in the [place] Do not drive yourself to the hospital. Summary  A pulmonary embolism (PE) is a sudden blockage or decrease of blood flow in one or both lungs. PE is a dangerous and life-threatening condition that needs to be treated right away.  Treatments for this condition usually include medicines to thin your blood (anticoagulants) or medicines to break apart blood clots (thrombolytics).  If you are given blood thinners, it is important to take the medicine every day at the same time each day.  Understand what foods and drugs interact with any medicines that you are taking.  If you have signs of PE or DVT, call your local emergency services (911 in the [place] This information is not intended to replace advice given to you by your health care provider. Make sure you discuss any questions you have with your health care provider. Document Revised: 04/22/2018 Document Reviewed: 04/22/2018 Elsevier Patient Education  2020 [employer].

## 2020-03-13 NOTE — Consult Note (Signed)
[facility] for Apixaban Indication: pulmonary embolus  Allergies  Allergen Reactions  . Penicillins Hives    Patient Measurements: Height: 5\' 5"  (165.1 cm) Weight: 106.3 kg (234 lb 5.6 oz) IBW/kg (Calculated) : 57  Vital Signs: Temp: 98.6 F (37 C) (08/16 0612) Temp Source: Oral (08/16 0612) BP: 109/36 (08/16 0612) Pulse Rate: 90 (08/16 0612)  Labs: Recent Labs    03/10/20 2313 03/10/20 2313 03/11/20 1238 03/11/20 1757 03/12/20 0039 03/12/20 0858 03/12/20 1700  HGB 11.0*   < > 9.4*  --  9.5*  --   --   HCT 34.7*  --  29.7*  --  28.8*  --   --   PLT 122*  --  103*  --  101*  --   --   HEPARINUNFRC  --   --   --    < > 0.30 0.43 0.51  CREATININE  --   --  2.75*  --  2.21*  --   --    < > = values in this interval not displayed.    Estimated Creatinine Clearance: 28.3 mL/min (A) (by C-G formula based on SCr of 2.21 mg/dL (H)).   Medications:  Medications Prior to Admission  Medication Sig Dispense Refill Last Dose  . Abatacept 125 MG/ML SOSY Inject 125 mg into the skin every 30 (thirty) days. Next dose March 15th   03/02/2020 at unknown  . allopurinol (ZYLOPRIM) 300 MG tablet Take 300 mg by mouth daily. In the morning   03/08/2020 at 1030  . Biotin 5000 MCG TABS Take 5,000 mcg by mouth daily.   03/08/2020 at 1030  . busPIRone (BUSPAR) 5 MG tablet Take 5 mg by mouth 4 (four) times daily as needed (anxiety).    prn at prn  . chlorthalidone (HYGROTON) 25 MG tablet Take 12.5 mg by mouth daily.    03/08/2020 at 1030  . Cyanocobalamin (VITAMIN B-12) 5000 MCG TBDP Take 5,000 mcg by mouth daily.   03/08/2020 at 1030  . diclofenac Sodium (VOLTAREN) 1 % GEL Apply 2 g topically 4 (four) times daily as needed (pain.).    prn at prn  . fluticasone (FLONASE) 50 MCG/ACT nasal spray Place 2 sprays into both nostrils daily.   03/08/2020 at 1030  . folic acid (FOLVITE) 195 MCG tablet Take 400 mcg by mouth daily.   03/08/2020 at 1030  . gabapentin  (NEURONTIN) 300 MG capsule Take 1-3 capsules (300-900 mg total) by mouth 4 (four) times daily. Follow written titration schedule. (Patient taking differently: Take 300 mg by mouth 4 (four) times daily. Follow written titration schedule.) 1080 capsule 1 03/08/2020 at 1030  . losartan (COZAAR) 100 MG tablet Take 100 mg by mouth daily.   03/08/2020 at 1030  . lubiprostone (AMITIZA) 24 MCG capsule Take 24 mcg by mouth 2 (two) times daily as needed for constipation.    prn at prn  . magnesium oxide (MAG-OX) 400 MG tablet Take 1 tablet by mouth daily.   03/08/2020 at 1030  . methotrexate 50 MG/2ML injection Inject 12.5 mg into the vein every Thursday. (0.5 ml)   03/02/2020  . nystatin cream (MYCOSTATIN) Apply 1 application topically 2 (two) times daily.     Marland Kitchen omeprazole (PRILOSEC) 20 MG capsule Take 20 mg by mouth daily before breakfast.    03/08/2020 at 1030  . oxyCODONE (OXY IR/ROXICODONE) 5 MG immediate release tablet Take 1-2 tablets (5-10 mg total) by mouth every 4 (four) hours as needed  for moderate pain (pain score 4-6). 60 tablet 0 prn at prn  . tiZANidine (ZANAFLEX) 4 MG capsule Take 4 mg by mouth 2 (two) times daily as needed for muscle spasms.   prn at prn  . traMADol (ULTRAM) 50 MG tablet Take 1 tablet (50 mg total) by mouth every 6 (six) hours as needed for moderate pain. 30 tablet 0 prn at prn   Scheduled:  . allopurinol  300 mg Oral Daily  . atorvastatin  40 mg Oral Daily  . folic acid  242 mcg Oral Daily  . gabapentin  100 mg Oral QID  . labetalol  100 mg Oral BID  . melatonin  5 mg Oral QHS  . pantoprazole  40 mg Oral Daily   Infusions:  . heparin 1,050 Units/hr (03/12/20 2305)   PRN: acetaminophen, busPIRone, HYDROmorphone (DILAUDID) injection, ondansetron (ZOFRAN) IV, oxyCODONE, tiZANidine Anti-infectives (From admission, onward)   Start     Dose/Rate Route Frequency Ordered Stop   03/11/20 0000  clindamycin (CLEOCIN) IVPB 300 mg       Note to Pharmacy: To be given in specials    300 mg 100 mL/hr over 30 Minutes Intravenous  Once 03/10/20 1152 03/10/20 1612      Assessment: Pharmacy was consulted to start apixaban for PE. Pt is currently on heparin infusion which will be d/c'ed. CBC seems to be stable. But plts trending down. Pt underwent thrombectomy 8/13.    Goal of Therapy:  Monitor platelets by anticoagulation protocol: Yes   Plan:  Apixaban 10 mg BID x 7 days followed by apixaban 5 mg BID  Oswald Hillock, PharmD, BCPS 03/13/2020,7:21 AM

## 2020-03-13 NOTE — Progress Notes (Signed)
Patient given discharge instructions with daughter at bedside. IV taken out and tele monitor off. Patient verbalized understanding without any questions or concerns. Oxygen delivered to room. Patient discharging with daughter.

## 2020-03-13 NOTE — Discharge Summary (Addendum)
Discharge Summary  Sabrina Green JIR:[id number] DOB: Dec 11, 1945  PCP: Sofie Hartigan, MD  Admit date: 03/08/2020 Discharge date: 03/16/2020  Time spent: 35 minutes  Recommendations for Outpatient Follow-up:  1. Follow-up with hematology 2. Follow-up with cardiology 3. Follow-up with your PCP 4. Continue PT OT with assistance and fall precautions 5. Take your medications as prescribed  Discharge Diagnoses:  Active Hospital Problems   Diagnosis Date Noted  . Pulmonary embolism with acute cor pulmonale ([place]) 03/10/2020  . DVT (deep venous thrombosis) ([place]) 03/16/2020  . Prolonged QT interval 03/08/2020  . Thrombocytopenia ([place]) 03/08/2020  . CKD (chronic kidney disease) stage 3, GFR 30-59 ml/min 03/28/2017  . Rheumatoid arthritis involving multiple sites ([place]) 02/26/2016  . Essential hypertension 02/26/2016    Resolved Hospital Problems   Diagnosis Date Noted Date Resolved  . NSTEMI (non-ST elevated myocardial infarction) ([place]) 03/08/2020 03/16/2020    Discharge Condition: Stable  Diet recommendation: Heart healthy diet  Vitals:   03/13/20 1019 03/13/20 1022  BP: 130/84 (!) 125/113  Pulse: (!) 111 (!) 108  Resp:    Temp:    SpO2: 97% 99%    History of present illness:  74 year old female with history of hypertension, asthma, IBS, [place], rheumatoid arthritis on methotrexate and abatacept, chronic pain syndrome with arthritis presented to the hospital with fatigue and increasing shortness of breath ongoing for more than 1 month and worse for the last few days. Also had episodes of left-sided precordial chest discomfort. In the emergency room Troponin 600. EKG shows new inverted T waves on anterior leads.Creatinine 1.77. Increased QT interval. Started on heparin for suspected non-STEMI and admitted to hospital.  Patient had elevated D-dimer, CT scan showed submassive pulmonary embolism with right heart strain.  Elevated troponin was likely 2/2 to acute PE and  NSTEMI was ruled out. 03/10/20, scheduled for thrombectomy and catheter directed TPA with vascular surgery.  03/13/20: Seen and examined with her daughter at her bedside.   She wants to go home.   Hospital Course:  Principal Problem:   Pulmonary embolism with acute cor pulmonale (HCC) Active Problems:   CKD (chronic kidney disease) stage 3, GFR 30-59 ml/min   Essential hypertension   Rheumatoid arthritis involving multiple sites (HCC)   Prolonged QT interval   Thrombocytopenia (HCC)   DVT (deep venous thrombosis) (HCC)  Acute bilateral submassive pulmonary embolism with right heart strain post TPA, transitioned to heparin drip, now on Eliquis Seen by vascular surgery and cardiology Hypoxia persists, currently requiring 2 to 3 L oxygen supplementation by nasal cannula to maintain O2 saturation greater than 92%. Follow-up with hematology and cardiology  Acute hypoxic respiratory failure secondary to acute pulmonary embolism Not on oxygen supplementation at baseline Continue incentive spirometer and flutter valve Continue oxygen supplementation to maintain O2 saturation greater than 92% Continue DOAC, Eliquis. Follow-up with your PCP  Sinus tachycardia, elevated troponin, likely secondary to acute PE Continue labetalol Seen by cardiology NSTEMI has been ruled out  AKI on CKD 3B Baseline creatinine appears to be 1.8 with GFR of 32 Creatinine downtrending 1.9 from 2.2 from 2.75 Continue to avoid nephrotoxins and dehydration  Good urine output, more than 2.3 L urine output recorded in the last 24 hours. Follow-up with your PCP  Resolved, hypotension suspect related to her pulmonary embolism On 8/13 MAP in the 3s Now hypertensive, hold off losartan and chlorthalidone due to AKI Continue labetalol   Essential hypertension Management as stated above  Hyperlipidemia LDL 121 Continue Lipitor 40 mg daily  Follow-up with your PCP  Acute right lower extremity DVT Continue  Eliquis Follow up with hematology  Recent left lower extremity repair by orthopedic surgery Done in February 2021 Follow-up with your orthopedic surgeon. Continue fall precautions  Chronic thrombocytopenia Platelet count is up trending, 102K PCP follow up    Code Status: Full code  Family Communication: Updated her daughter at bedside     Consultants:  Vascular surgery  Cardiology  Procedures:  Thrombolysis and mechanical thrombectomy on 03/10/2020 by vascular surgery Selective catheter placement rightmiddle and lower lobes Selective catheter placement leftupper and lower lobes  Antimicrobials:  None  Discharge Exam: BP (!) 125/113 (BP Location: Left Arm)   Pulse (!) 108   Temp 98.1 F (36.7 C)   Resp 18   Ht 5\' 5"  (1.651 m)   Wt 106.3 kg   SpO2 99%   BMI 39.00 kg/m  . General: 74 y.o. year-old female well developed well nourished in no acute distress.  Alert and oriented x3. . Cardiovascular: Regular rate and rhythm with no rubs or gallops.  No thyromegaly or JVD noted.   Marland Kitchen Respiratory: Mild rales at bases no wheezing noted.   . Abdomen: Soft nontender nondistended with normal bowel sounds x4 quadrants. . Musculoskeletal: No lower extremity edema. 2/4 pulses in all 4 extremities. Marland Kitchen Psychiatry: Mood is appropriate for condition and setting  Discharge Instructions You were cared for by a hospitalist during your hospital stay. If you have any questions about your discharge medications or the care you received while you were in the hospital after you are discharged, you can call the unit and asked to speak with the hospitalist on call if the hospitalist that took care of you is not available. Once you are discharged, your primary care physician will handle any further medical issues. Please note that NO REFILLS for any discharge medications will be authorized once you are discharged, as it is imperative that you return to your primary care  physician (or establish a relationship with a primary care physician if you do not have one) for your aftercare needs so that they can reassess your need for medications and monitor your lab values.   Allergies as of 03/13/2020      Reactions   Penicillins Hives      Medication List    STOP taking these medications   chlorthalidone 25 MG tablet Commonly known as: HYGROTON   diclofenac Sodium 1 % Gel Commonly known as: VOLTAREN   losartan 100 MG tablet Commonly known as: COZAAR     TAKE these medications   Abatacept 125 MG/ML Sosy Inject 125 mg into the skin every 30 (thirty) days. Next dose March 15th   allopurinol 300 MG tablet Commonly known as: ZYLOPRIM Take 300 mg by mouth daily. In the morning   Apixaban Starter Pack (10mg  and 5mg ) Commonly known as: ELIQUIS STARTER PACK Take as directed on package: start with two-5mg  tablets twice daily for 7 days. On day 8, switch to one-5mg  tablet twice daily.   atorvastatin 40 MG tablet Commonly known as: LIPITOR Take 1 tablet (40 mg total) by mouth daily.   Biotin 5000 MCG Tabs Take 5,000 mcg by mouth daily.   busPIRone 5 MG tablet Commonly known as: BUSPAR Take 5 mg by mouth 4 (four) times daily as needed (anxiety).   fluticasone 50 MCG/ACT nasal spray Commonly known as: FLONASE Place 2 sprays into both nostrils daily.   folic acid 250 MCG tablet Commonly known as: FOLVITE Take  400 mcg by mouth daily.   gabapentin 100 MG capsule Commonly known as: NEURONTIN Take 1 capsule (100 mg total) by mouth 3 (three) times daily for 14 days. What changed:   medication strength  how much to take  when to take this  additional instructions   labetalol 100 MG tablet Commonly known as: NORMODYNE Take 1 tablet (100 mg total) by mouth 2 (two) times daily.   lubiprostone 24 MCG capsule Commonly known as: AMITIZA Take 24 mcg by mouth 2 (two) times daily as needed for constipation.   magnesium oxide 400 MG  tablet Commonly known as: MAG-OX Take 1 tablet by mouth daily.   methotrexate 50 MG/2ML injection Inject 12.5 mg into the vein every Thursday. (0.5 ml)   nystatin cream Commonly known as: MYCOSTATIN Apply 1 application topically 2 (two) times daily.   omeprazole 20 MG capsule Commonly known as: PRILOSEC Take 20 mg by mouth daily before breakfast.   oxyCODONE 5 MG immediate release tablet Commonly known as: Oxy IR/ROXICODONE Take 1-2 tablets (5-10 mg total) by mouth every 4 (four) hours as needed for moderate pain (pain score 4-6).   tiZANidine 4 MG capsule Commonly known as: ZANAFLEX Take 4 mg by mouth 2 (two) times daily as needed for muscle spasms.   traMADol 50 MG tablet Commonly known as: ULTRAM Take 1 tablet (50 mg total) by mouth every 6 (six) hours as needed for moderate pain.   Vitamin B-12 5000 MCG Tbdp Take 5,000 mcg by mouth daily.      Allergies  Allergen Reactions  . Penicillins Hives    Follow-up Information    Sofie Hartigan, MD. Call in 1 day(s).   Specialty: Family Medicine Why: Please call for a post hospital follow-up appointment. Contact information: [address] [place] [address] [phone]        Minna Merritts, MD. Call in 1 day(s).   Specialty: Cardiology Why: Please call for a post hospital follow-up appointment. Contact information: [place] [address] [phone]        Lequita Asal, MD. Call in 1 day(s).   Specialty: Hematology and Oncology Why: Please call for a post hospital follow up appointment Contact information: [place] [address] [phone]                The results of significant diagnostics from this hospitalization (including imaging, microbiology, ancillary and laboratory) are listed below for reference.    Significant Diagnostic Studies: DG Chest 2 View  Result Date: 03/08/2020 CLINICAL DATA:  Dyspnea EXAM: CHEST - 2  VIEW COMPARISON:  09/07/2017 FINDINGS: Lungs are well expanded, symmetric, and clear. No pneumothorax or pleural effusion. Cardiac size within normal limits. Pulmonary vascularity is normal. Osseous structures are age-appropriate. No acute bone abnormality. IMPRESSION: No active cardiopulmonary disease. Electronically Signed   By: Fidela Salisbury MD   On: 03/08/2020 20:29   CT CHEST WO CONTRAST  Result Date: 03/11/2020 CLINICAL DATA:  Recent pulmonary embolus status post thrombectomy/tPA. Ground-glass opacities and concern for hemorrhage. EXAM: CT CHEST WITHOUT CONTRAST TECHNIQUE: Multidetector CT imaging of the chest was performed following the standard protocol without IV contrast. COMPARISON:  Radiograph yesterday.  Chest CTA 03/09/2020 FINDINGS: Cardiovascular: Aortic atherosclerosis. No aortic aneurysm. Coronary artery calcifications/stent. No pericardial effusion. Mediastinum/Nodes: No enlarged mediastinal lymph nodes. Limited assessment for hilar adenopathy on noncontrast exam. Right thyroid calcification without evidence of nodule. No esophageal wall thickening. There is a tiny hiatal  hernia. Lungs/Pleura: Peribronchovascular ground-glass opacities in the superior segment of both lower lobes, right middle lobe, as well as throughout the dependent left greater than right upper lobe. Minimal superimposed consolidation in the left upper lobe. Minimal associated septal thickening in the left upper lobe. No pneumothorax or pleural effusion. Trachea and central bronchi are patent. Upper Abdomen: Cholecystectomy. There is excreted IV contrast in the kidneys or renal collecting system, likely from prior thrombectomy. Musculoskeletal: There are no acute or suspicious osseous abnormalities. IMPRESSION: 1. Ground-glass opacities within both lungs, more prominent on the left involving the upper lobe. Favor pulmonary hemorrhage given history of recent pulmonary tPA. Possibility of asymmetric edema is also considered  but felt less likely. 2. Coronary artery calcifications/stents. Aortic Atherosclerosis (ICD10-I70.0). Electronically Signed   By: Keith Rake M.D.   On: 03/11/2020 00:54   CT ANGIO CHEST PE W OR WO CONTRAST  Result Date: 03/09/2020 CLINICAL DATA:  74 year old female with shortness of breath. Concern for pulmonary embolism. EXAM: CT ANGIOGRAPHY CHEST WITH CONTRAST TECHNIQUE: Multidetector CT imaging of the chest was performed using the standard protocol during bolus administration of intravenous contrast. Multiplanar CT image reconstructions and MIPs were obtained to evaluate the vascular anatomy. CONTRAST:  22mL OMNIPAQUE IOHEXOL 350 MG/ML SOLN COMPARISON:  Chest radiograph dated 03/08/2020. FINDINGS: Cardiovascular: There is mild dilatation of the right heart chamber. The right ventricular lumen measures 4.6 cm in diameter and the left ventricular lumen is approximately 3 cm in diameter with a RV/LV ratio of 1.5. No pericardial effusion. There is mild atherosclerotic calcification of the thoracic aorta. No aneurysmal dilatation. Large bilateral pulmonary artery emboli involving the central pulmonary arteries extending into the lobar branches. Mediastinum/Nodes: No hilar or mediastinal adenopathy. The esophagus and the thyroid gland are grossly unremarkable. No mediastinal fluid collection. Lungs/Pleura: The lungs are clear. There is no pleural effusion pneumothorax. The central airways are patent. Upper Abdomen: Cholecystectomy. Musculoskeletal: No chest wall abnormality. No acute or significant osseous findings. Review of the MIP images confirms the above findings. IMPRESSION: Positive for acute PE with of right heart strain (RV/LV Ratio = 1.5) consistent with at least submassive (intermediate risk) PE. The presence of right heart strain has been associated with an increased risk of morbidity and mortality. These results were called by telephone at the time of interpretation on 03/09/2020 at 5:42 pm to  provider Dr. Sloan Leiter, who verbally acknowledged these results. Electronically Signed   By: Anner Crete M.D.   On: 03/09/2020 17:56   PERIPHERAL VASCULAR CATHETERIZATION  Result Date: 03/10/2020 See op note  US Venous Img Lower Bilateral (DVT)  Result Date: 03/10/2020 CLINICAL DATA:  History of pulmonary embolism EXAM: BILATERAL LOWER EXTREMITY VENOUS DOPPLER ULTRASOUND TECHNIQUE: Gray-scale sonography with graded compression, as well as color Doppler and duplex ultrasound were performed to evaluate the lower extremity deep venous systems from the level of the common femoral vein and including the common femoral, femoral, profunda femoral, popliteal and calf veins including the posterior tibial, peroneal and gastrocnemius veins when visible. The superficial great saphenous vein was also interrogated. Spectral Doppler was utilized to evaluate flow at rest and with distal augmentation maneuvers in the common femoral, femoral and popliteal veins. COMPARISON:  None. FINDINGS: RIGHT LOWER EXTREMITY Common Femoral Vein: No evidence of thrombus. Normal compressibility, respiratory phasicity and response to augmentation. Saphenofemoral Junction: No evidence of thrombus. Normal compressibility and flow on color Doppler imaging. Profunda Femoral Vein: No evidence of thrombus. Normal compressibility and flow on color Doppler imaging. Femoral Vein: No  evidence of thrombus. Normal compressibility, respiratory phasicity and response to augmentation. Popliteal Vein: Popliteal thrombus is noted with decreased compressibility. Calf Veins: No evidence of thrombus. Normal compressibility and flow on color Doppler imaging. Superficial Great Saphenous Vein: No evidence of thrombus. Normal compressibility. Venous Reflux:  None. Other Findings:  None. LEFT LOWER EXTREMITY Common Femoral Vein: No evidence of thrombus. Normal compressibility, respiratory phasicity and response to augmentation. Saphenofemoral Junction: No  evidence of thrombus. Normal compressibility and flow on color Doppler imaging. Profunda Femoral Vein: No evidence of thrombus. Normal compressibility and flow on color Doppler imaging. Femoral Vein: No evidence of thrombus. Normal compressibility, respiratory phasicity and response to augmentation. Popliteal Vein: No evidence of thrombus. Normal compressibility, respiratory phasicity and response to augmentation. Calf Veins: No evidence of thrombus. Normal compressibility and flow on color Doppler imaging. Superficial Great Saphenous Vein: No evidence of thrombus. Normal compressibility. Venous Reflux:  None. Other Findings:  None. IMPRESSION: Right popliteal deep venous thrombosis. Electronically Signed   By: Inez Catalina M.D.   On: 03/10/2020 12:31   DG Chest Port 1 View  Result Date: 03/10/2020 CLINICAL DATA:  Hypoxia EXAM: PORTABLE CHEST 1 VIEW COMPARISON:  03/08/2020, CT chest 03/09/2020 FINDINGS: Interim development of ground-glass opacity within the left chest most notable in the upper lobe. Borderline cardiomegaly. Aortic atherosclerosis. No pneumothorax. IMPRESSION: Interim development of ground-glass opacity within the left chest most notable in the upper lobe; findings could be secondary to pneumonia, hemorrhage, or atypical appearance of edema. Electronically Signed   By: Donavan Foil M.D.   On: 03/10/2020 21:57   ECHOCARDIOGRAM COMPLETE  Result Date: 03/09/2020    ECHOCARDIOGRAM REPORT   Patient Name:   [facility] Date of Exam: 03/09/2020 Medical Rec #:  [id number]        Height:       65.0 in Accession #:    [id number]       Weight:       240.0 lb Date of Birth:  [birth year]       BSA:          2.138 m Patient Age:    37 years         BP:           113/64 mmHg Patient Gender: F                HR:           86 bpm. Exam Location:  [facility] Procedure: 2D Echo, Color Doppler, Cardiac Doppler and Intracardiac            Opacification Agent Indications:     I24.9 Acute Coronary Syndrome  History:          Patient has no prior history of Echocardiogram examinations.                  CKD; Risk Factors:Hypertension. Asthma.  Sonographer:     Charmayne Sheer RDCS (AE) Referring Phys:  [place] Diagnosing Phys: Ida Rogue MD  Sonographer Comments: Suboptimal apical window. Image acquisition challenging due to patient body habitus. IMPRESSIONS  1. Left ventricular ejection fraction, by estimation, is 55 to 60%. The left ventricle has normal function. The left ventricle has no regional wall motion abnormalities. Left ventricular diastolic parameters are consistent with Grade I diastolic dysfunction (impaired relaxation).  2. Right ventricular systolic function is mildly reduced. The right ventricular size is severely enlarged. There is severely elevated pulmonary artery systolic pressure. The estimated right ventricular  systolic pressure is 25.0 mmHg.  3. Right atrial size was moderately dilated. FINDINGS  Left Ventricle: Left ventricular ejection fraction, by estimation, is 55 to 60%. The left ventricle has normal function. The left ventricle has no regional wall motion abnormalities. Definity contrast agent was given IV to delineate the left ventricular  endocardial borders. The left ventricular internal cavity size was normal in size. There is no left ventricular hypertrophy. Left ventricular diastolic parameters are consistent with Grade I diastolic dysfunction (impaired relaxation). Right Ventricle: The right ventricular size is severely enlarged. No increase in right ventricular wall thickness. Right ventricular systolic function is mildly reduced. There is severely elevated pulmonary artery systolic pressure. The tricuspid regurgitant velocity is 3.68 m/s, and with an assumed right atrial pressure of 10 mmHg, the estimated right ventricular systolic pressure is 53.9 mmHg. Left Atrium: Left atrial size was normal in size. Right Atrium: Right atrial size was moderately dilated. Pericardium: There is  no evidence of pericardial effusion. Mitral Valve: The mitral valve is normal in structure. Normal mobility of the mitral valve leaflets. No evidence of mitral valve regurgitation. No evidence of mitral valve stenosis. MV peak gradient, 3.6 mmHg. The mean mitral valve gradient is 1.0 mmHg. Tricuspid Valve: The tricuspid valve is normal in structure. Tricuspid valve regurgitation is mild . No evidence of tricuspid stenosis. Aortic Valve: The aortic valve is normal in structure. Aortic valve regurgitation is not visualized. Mild to moderate aortic valve sclerosis/calcification is present, without any evidence of aortic stenosis. Aortic valve mean gradient measures 3.0 mmHg. Aortic valve peak gradient measures 6.8 mmHg. Aortic valve area, by VTI measures 2.13 cm. Pulmonic Valve: The pulmonic valve was normal in structure. Pulmonic valve regurgitation is not visualized. No evidence of pulmonic stenosis. Aorta: The aortic root is normal in size and structure. Venous: The inferior vena cava is normal in size with greater than 50% respiratory variability, suggesting right atrial pressure of 3 mmHg. IAS/Shunts: No atrial level shunt detected by color flow Doppler.  LEFT VENTRICLE PLAX 2D LVIDd:         4.22 cm  Diastology LVIDs:         3.37 cm  LV e' lateral:   6.64 cm/s LV PW:         0.86 cm  LV E/e' lateral: 6.6 LV IVS:        0.84 cm  LV e' medial:    6.64 cm/s LVOT diam:     2.10 cm  LV E/e' medial:  6.6 LV SV:         46 LV SV Index:   22 LVOT Area:     3.46 cm  LEFT ATRIUM             Index LA diam:        2.20 cm 1.03 cm/m LA Vol (A2C):   28.6 ml 13.38 ml/m LA Vol (A4C):   50.4 ml 23.58 ml/m LA Biplane Vol: 39.3 ml 18.39 ml/m  AORTIC VALVE                   PULMONIC VALVE AV Area (Vmax):    2.33 cm    PV Vmax:       0.78 m/s AV Area (Vmean):   2.24 cm    PV Vmean:      56.800 cm/s AV Area (VTI):     2.13 cm    PV VTI:        0.140 m AV Vmax:  130.00 cm/s PV Peak grad:  2.4 mmHg AV Vmean:           84.000 cm/s PV Mean grad:  1.0 mmHg AV VTI:            0.218 m AV Peak Grad:      6.8 mmHg AV Mean Grad:      3.0 mmHg LVOT Vmax:         87.50 cm/s LVOT Vmean:        54.400 cm/s LVOT VTI:          0.134 m LVOT/AV VTI ratio: 0.61  AORTA Ao Root diam: 2.80 cm MITRAL VALVE               TRICUSPID VALVE MV Area (PHT): 4.49 cm    TR Peak grad:   54.2 mmHg MV Peak grad:  3.6 mmHg    TR Vmax:        368.00 cm/s MV Mean grad:  1.0 mmHg MV Vmax:       0.95 m/s    SHUNTS MV Vmean:      52.1 cm/s   Systemic VTI:  0.13 m MV Decel Time: 169 msec    Systemic Diam: 2.10 cm MV E velocity: 43.70 cm/s MV A velocity: 66.80 cm/s MV E/A ratio:  0.65 Ida Rogue MD Electronically signed by Ida Rogue MD Signature Date/Time: 03/09/2020/6:32:24 PM    Final     Microbiology: Recent Results (from the past 240 hour(s))  SARS Coronavirus 2 by RT PCR (hospital order, performed in [facility] hospital lab) Nasopharyngeal Nasopharyngeal Swab     Status: None   Collection Time: 03/08/20  9:17 PM   Specimen: Nasopharyngeal Swab  Result Value Ref Range Status   SARS Coronavirus 2 NEGATIVE NEGATIVE Final    Comment: (NOTE) SARS-CoV-2 target nucleic acids are NOT DETECTED.  The SARS-CoV-2 RNA is generally detectable in upper and lower respiratory specimens during the acute phase of infection. The lowest concentration of SARS-CoV-2 viral copies this assay can detect is 250 copies / mL. A negative result does not preclude SARS-CoV-2 infection and should not be used as the sole basis for treatment or other patient management decisions.  A negative result may occur with improper specimen collection / handling, submission of specimen other than nasopharyngeal swab, presence of viral mutation(s) within the areas targeted by this assay, and inadequate number of viral copies (<250 copies / mL). A negative result must be combined with clinical observations, patient history, and epidemiological information.  Fact Sheet for  Patients:   StrictlyIdeas.no  Fact Sheet for Healthcare Providers: BankingDealers.co.za  This test is not yet approved or  cleared by the [place] FDA and has been authorized for detection and/or diagnosis of SARS-CoV-2 by FDA under an Emergency Use Authorization (EUA).  This EUA will remain in effect (meaning this test can be used) for the duration of the COVID-19 declaration under Section 564(b)(1) of the Act, 21 U.S.C. section 360bbb-3(b)(1), unless the authorization is terminated or revoked sooner.  Performed at [facility], [address] [place], [place] [address]      Labs: Basic Metabolic Panel: Recent Labs  Lab 03/10/20 0104 03/11/20 1238 03/12/20 0039 03/13/20 0746  NA 140 138 143 139  K 4.0 4.3 3.8 3.9  CL 107 105 109 104  CO2 22 23 21* 21*  GLUCOSE 134* 111* 140* 120*  BUN 37* 38* 41* 35*  CREATININE 1.82* 2.75* 2.21* 1.94*  CALCIUM 8.7* 8.9 8.8* 9.1  MG  --   --   --  1.6*   Liver Function Tests: No results for input(s): AST, ALT, ALKPHOS, BILITOT, PROT, ALBUMIN in the last 168 hours. No results for input(s): LIPASE, AMYLASE in the last 168 hours. No results for input(s): AMMONIA in the last 168 hours. CBC: Recent Labs  Lab 03/10/20 0104 03/10/20 2313 03/11/20 1238 03/12/20 0039 03/13/20 0746  WBC 6.8 8.4 7.9 8.8 9.0  HGB 11.0* 11.0* 9.4* 9.5* 10.1*  HCT 32.6* 34.7* 29.7* 28.8* 30.7*  MCV 86.2 91.3 91.1 88.1 89.8  PLT 125* 122* 103* 101* 102*   Cardiac Enzymes: No results for input(s): CKTOTAL, CKMB, CKMBINDEX, TROPONINI in the last 168 hours. BNP: BNP (last 3 results) No results for input(s): BNP in the last 8760 hours.  ProBNP (last 3 results) No results for input(s): PROBNP in the last 8760 hours.  CBG: No results for input(s): GLUCAP in the last 168 hours.     Signed:  Kayleen Memos, MD [facility] 03/16/2020, 9:25 AM

## 2020-03-13 NOTE — Progress Notes (Addendum)
This RN and nurse tech went in to assist patient to get up per patient request. Daughter at bedside attempting to untangle cords from heart monitor and had it unplugged. CCMD called to put patient on stand by as patient will getting washed up as well with the assist of daughter per patient request. Patient assisted to the bathroom and educated to call out if assistance is needed but patient insisted that daughter is nurse and that she would help with anything. Requested for me to come back with her morning medications. Will monitor patient.

## 2020-03-13 NOTE — Progress Notes (Signed)
Progress Note  Patient Name: Sabrina Green Date of Encounter: 03/13/2020  Primary Cardiologist: New to [facility] - consult by [place]  Subjective   No chest pain. Does not notice any change in her dyspnea at this time when compared to her presentation. Continues to note weakness/lightheadedness/presyncope with ambulation. No chest pain.   Inpatient Medications    Scheduled Meds: . allopurinol  300 mg Oral Daily  . apixaban  10 mg Oral BID   Followed by  . [START ON 03/20/2020] apixaban  5 mg Oral BID  . atorvastatin  40 mg Oral Daily  . folic acid  149 mcg Oral Daily  . gabapentin  100 mg Oral QID  . labetalol  100 mg Oral BID  . melatonin  5 mg Oral QHS  . pantoprazole  40 mg Oral Daily   Continuous Infusions:  PRN Meds: acetaminophen, busPIRone, HYDROmorphone (DILAUDID) injection, ondansetron (ZOFRAN) IV, oxyCODONE, tiZANidine   Vital Signs    Vitals:   03/12/20 2057 03/13/20 0057 03/13/20 0612 03/13/20 0731  BP: (!) 124/44 (!) 110/46 (!) 109/36 (!) 120/46  Pulse: (!) 113 86 90 (!) 102  Resp: (!) 25 20 16 16   Temp: 97.9 F (36.6 C) 98 F (36.7 C) 98.6 F (37 C) 98.3 F (36.8 C)  TempSrc: Oral Oral Oral   SpO2: 97% 100% 97% 97%  Weight:   106.3 kg   Height:        Intake/Output Summary (Last 24 hours) at 03/13/2020 0821 Last data filed at 03/13/2020 0726 Gross per 24 hour  Intake 1215.44 ml  Output 2350 ml  Net -1134.56 ml   Filed Weights   03/11/20 0600 03/12/20 0356 03/13/20 0612  Weight: 107 kg 106.6 kg 106.3 kg    Telemetry    SR with rates in the 90s to low 100s bpm - Personally Reviewed  ECG    No new tracings - Personally Reviewed  Physical Exam   GEN: No acute distress.   Neck: No JVD. Cardiac: RRR, no murmurs, rubs, or gallops.  Respiratory: Clear to auscultation bilaterally.  GI: Soft, nontender, non-distended.   MS: No edema; No deformity. Neuro:  Alert and oriented x 3; Nonfocal.  Psych: Normal affect.  Labs     Chemistry Recent Labs  Lab 03/10/20 0104 03/11/20 1238 03/12/20 0039  NA 140 138 143  K 4.0 4.3 3.8  CL 107 105 109  CO2 22 23 21*  GLUCOSE 134* 111* 140*  BUN 37* 38* 41*  CREATININE 1.82* 2.75* 2.21*  CALCIUM 8.7* 8.9 8.8*  GFRNONAA 27* 17* 22*  GFRAA 32* 19* 25*  ANIONGAP 11 10 13      Hematology Recent Labs  Lab 03/10/20 2313 03/11/20 1238 03/12/20 0039  WBC 8.4 7.9 8.8  RBC 3.80* 3.26* 3.27*  HGB 11.0* 9.4* 9.5*  HCT 34.7* 29.7* 28.8*  MCV 91.3 91.1 88.1  MCH 28.9 28.8 29.1  MCHC 31.7 31.6 33.0  RDW 15.6* 15.8* 15.8*  PLT 122* 103* 101*    Cardiac EnzymesNo results for input(s): TROPONINI in the last 168 hours. No results for input(s): TROPIPOC in the last 168 hours.   BNPNo results for input(s): BNP, PROBNP in the last 168 hours.   DDimer No results for input(s): DDIMER in the last 168 hours.   Radiology    No results found.  Cardiac Studies   2D echo 03/09/2020: 1. Left ventricular ejection fraction, by estimation, is 55 to 60%. The  left ventricle has normal function. The  left ventricle has no regional  wall motion abnormalities. Left ventricular diastolic parameters are  consistent with Grade I diastolic  dysfunction (impaired relaxation).  2. Right ventricular systolic function is mildly reduced. The right  ventricular size is severely enlarged. There is severely elevated  pulmonary artery systolic pressure. The estimated right ventricular  systolic pressure is 59.1 mmHg.  3. Right atrial size was moderately dilated.  Patient Profile     74 y.o. female with history of CKD stage IIIa, RA, anemia of chronic disease, fibromyalgia, DDD, chronic back pain on opiates, osteoarthritis, and obesity who was found to have bilateral PEs this admission and we are seeing for elevated HS-Tn.   Assessment & Plan    1. Acute respiratory distress/bilateral PEs with right heart strain/right-sided popliteal DVT: -Possibly in the setting of sedentary  lifestyle -Consider outpatient hematology evaluation  -Status post Underwent TPA with mechanical thrombectomy multiple lobes -Some hypoxia noted over the weekend with possible hemorrhage into the lung -Remains on heparin gtt with transition to DOAC at the discretion of IM -Right heart strain noted on echo, though will hold off on IV Lasix at this time with her renal function being above her baseline  2. Lightheadedness/maliase/fatigue: -Orthostatic vitals negative this admission -Likely a comp[onent of obesity with physical deconditioning exacerbated by the above -Has been seen by PT with HHPT advised   3. Acute on CKD stage III: -Renal function improving, though remains above baseline  4. Anemia of chronic disease: -Stable  For questions or updates, please contact [place] Please consult [email or web address] for contact info under Cardiology/STEMI.    Signed, Christell Faith, PA-C [facility] Pager: [phone] 03/13/2020, 8:21 AM

## 2020-03-13 NOTE — Plan of Care (Signed)
  Problem: Education: Goal: Knowledge of General Education information will improve Description: Including pain rating scale, medication(s)/side effects and non-pharmacologic comfort measures 03/13/2020 1250 by Chriss Czar, Illene Bolus, RN Outcome: Adequate for Discharge 03/13/2020 1249 by Chriss Czar, Illene Bolus, RN Outcome: Adequate for Discharge   Problem: Health Behavior/Discharge Planning: Goal: Ability to manage health-related needs will improve 03/13/2020 1250 by Chriss Czar, Illene Bolus, RN Outcome: Adequate for Discharge 03/13/2020 1249 by Chriss Czar, Illene Bolus, RN Outcome: Adequate for Discharge   Problem: Clinical Measurements: Goal: Ability to maintain clinical measurements within normal limits will improve 03/13/2020 1250 by Chriss Czar, Illene Bolus, RN Outcome: Adequate for Discharge 03/13/2020 1249 by Chriss Czar, Illene Bolus, RN Outcome: Adequate for Discharge Goal: Will remain free from infection 03/13/2020 1250 by Chriss Czar, Illene Bolus, RN Outcome: Adequate for Discharge 03/13/2020 1249 by Chriss Czar, Illene Bolus, RN Outcome: Adequate for Discharge Goal: Diagnostic test results will improve 03/13/2020 1250 by Feliberto Gottron, RN Outcome: Adequate for Discharge 03/13/2020 1249 by Chriss Czar, Illene Bolus, RN Outcome: Adequate for Discharge Goal: Respiratory complications will improve 03/13/2020 1250 by Feliberto Gottron, RN Outcome: Adequate for Discharge 03/13/2020 1249 by Chriss Czar, Illene Bolus, RN Outcome: Adequate for Discharge Goal: Cardiovascular complication will be avoided 03/13/2020 1250 by Feliberto Gottron, RN Outcome: Adequate for Discharge 03/13/2020 1249 by Feliberto Gottron, RN Outcome: Adequate for Discharge   Problem: Activity: Goal: Risk for activity intolerance will decrease 03/13/2020 1250 by Chriss Czar, Illene Bolus, RN Outcome: Adequate for Discharge 03/13/2020 1249 by Chriss Czar, Illene Bolus, RN Outcome:  Adequate for Discharge   Problem: Nutrition: Goal: Adequate nutrition will be maintained 03/13/2020 1250 by Chriss Czar, Illene Bolus, RN Outcome: Adequate for Discharge 03/13/2020 1249 by Chriss Czar, Illene Bolus, RN Outcome: Adequate for Discharge   Problem: Coping: Goal: Level of anxiety will decrease 03/13/2020 1250 by Chriss Czar, Illene Bolus, RN Outcome: Adequate for Discharge 03/13/2020 1249 by Chriss Czar, Illene Bolus, RN Outcome: Adequate for Discharge   Problem: Elimination: Goal: Will not experience complications related to bowel motility 03/13/2020 1250 by Feliberto Gottron, RN Outcome: Adequate for Discharge 03/13/2020 1249 by Chriss Czar, Illene Bolus, RN Outcome: Adequate for Discharge Goal: Will not experience complications related to urinary retention 03/13/2020 1250 by Feliberto Gottron, RN Outcome: Adequate for Discharge 03/13/2020 1249 by Chriss Czar, Illene Bolus, RN Outcome: Adequate for Discharge   Problem: Pain Managment: Goal: General experience of comfort will improve 03/13/2020 1250 by Chriss Czar, Illene Bolus, RN Outcome: Adequate for Discharge 03/13/2020 1249 by Chriss Czar, Illene Bolus, RN Outcome: Adequate for Discharge   Problem: Safety: Goal: Ability to remain free from injury will improve 03/13/2020 1250 by Chriss Czar, Illene Bolus, RN Outcome: Adequate for Discharge 03/13/2020 1249 by Chriss Czar, Illene Bolus, RN Outcome: Adequate for Discharge   Problem: Skin Integrity: Goal: Risk for impaired skin integrity will decrease 03/13/2020 1250 by Feliberto Gottron, RN Outcome: Adequate for Discharge 03/13/2020 1249 by Feliberto Gottron, RN Outcome: Adequate for Discharge

## 2020-03-13 NOTE — Progress Notes (Signed)
SATURATION QUALIFICATIONS: (This note is used to comply with regulatory documentation for home oxygen)  Patient Saturations on Room Air at Rest = 77%  Patient Saturations on Room Air while Ambulating = n/a%  Patient Saturations on 2 Liters of oxygen while Ambulating = 100%  Please briefly explain why patient needs home oxygen: pt desat to 77% while on room air while at rest.

## 2020-03-14 ENCOUNTER — Encounter: Payer: Self-pay | Admitting: Vascular Surgery

## 2020-03-15 ENCOUNTER — Ambulatory Visit: Payer: Medicare Other | Admitting: Pain Medicine

## 2020-03-15 ENCOUNTER — Telehealth: Payer: Self-pay | Admitting: Cardiovascular Disease

## 2020-03-15 NOTE — Telephone Encounter (Signed)
Patient family member calling  States that patient was seen in hospital by Dr Rockey Situ  Patient had a trip scheduled for [place] but is now unable to go due to condition Would like to know if Dr Rockey Situ can write a letter indicating this so she can get her money back for her travel arrangements If so, would like it emailed to [email or web address] if possible Please call to discuss

## 2020-03-16 DIAGNOSIS — I82409 Acute embolism and thrombosis of unspecified deep veins of unspecified lower extremity: Secondary | ICD-10-CM | POA: Diagnosis present

## 2020-03-16 NOTE — Telephone Encounter (Signed)
Spoke with patient and reviewed that letter can be done but not sure what time and we are not able to email. So recommended for her to pick up and that I would have someone call her when it is ready. She verbalized understanding with no further questions at this time.

## 2020-03-16 NOTE — Telephone Encounter (Signed)
Patient was seen for elevated troponin and found to have bilateral PEs with right heart strain. I can write her a letter when I am back in the office. If that is too late, you can see if Dr. Rockey Situ can today. That said, this does not guarantee she will be refunded any money. That is up to whomever she has her travel insurance with.

## 2020-03-16 NOTE — Telephone Encounter (Signed)
Patient calling to check on the status of this.   Please advise as the patient as she does have quick deadline

## 2020-03-17 ENCOUNTER — Encounter: Payer: Self-pay | Admitting: Physician Assistant

## 2020-03-17 NOTE — Telephone Encounter (Signed)
Sabrina Green has completed a letter for the patient as requested. He gave this to me today. I have called and notified the patient the letter is ready.  She will come down to the office to pick this up at our front desk.

## 2020-03-21 NOTE — ED Provider Notes (Signed)
Addendum to my ER note from 03/08/2020.  Critical care time 12 minutes.  This includes talking to the patient examining the patient reviewing her labs old records and speaking to the hospitalist.   Nena Polio, MD 03/21/20 [phone]

## 2020-03-26 NOTE — Progress Notes (Signed)
Cardiology Office Note    Date:  03/27/2020   ID:  Sabrina, Green January 21, 1946, MRN [id number]  PCP:  Sofie Hartigan, MD  Cardiologist:  Ida Rogue, MD  Electrophysiologist:  None   Chief Complaint: Hospital follow up  History of Present Illness:   Sabrina Green is a 74 y.o. female with history of recently diagnosed PE/DVT with right heart strain s/p pulmonary thrombectomy and TPA in 02/2020 as outlined below, CKD stage III, RA, anemia of chronic disease, fibromyalgia, HTN, HLD, DDD, chronic back pain, OA and obesity who presents for hospital follow up of recent PE.   She was admitted to [facility] from 8/11 to 03/13/2020 with dizziness, generalized malaise, fatigue, and dyspnea. HS-Tn peaked at 714. EKG showed sinus rhythm with inferior and anterolateral TWI. Echo showed an EF of 55-60%, no RWMA, Gr1DD, mildly reduced RVSF with a severely enlarged RV cavity size and severely elevated PASP estimated at 64.2 mmHg, moderately dilated RA, mild TR. D-dimer was elevated at > 7,500. Chest CTA showed large bilateral pulmonary emboli involving the central pulmonary arteries extending into the lobar branches. Bilateral lower extremity ultrasound showed a right popliteal DVT. She subsequently underwent thrombolysis and pulmonary thrombectomy by vascular surgery. With persistent hypoxia, noncontrast CT was obtained and most consistent with pulmonary hemorrhage. It was noted she had recently had a left TKA in 08/2019, which was felt to possibly be the contributing factor to her DVT/PE. She was discharged on supplemental oxygen with recommendations to follow up with PCP, hematology, and cardiology.   Comes in doing well from a cardiac perspective.  She denies any chest pain, worsening dyspnea, palpitations, dizziness, presyncope, or syncope.  She does note some mild bilateral lower extremity swelling and does not always elevate her legs when sitting.  She remains on supplemental oxygen via nasal  cannula at 3 L.  She has had some episodes of epistaxis though these have improved somewhat with saline nasal spray.  No falls, hematochezia, or melena.  She has been advised by PCP to start vitamin C and iron with her low hemoglobin.  She sees hematology tomorrow.  She reports oxygen saturations at home in the mid to upper 90s at rest.  She has not checked ambulatory saturations.   Labs independently reviewed: 02/2020 - HGB 9.2, PLT 206, potassium 4.0, BUN 26, SCr 1.5, AST/ALT normal, albumin 3.9, magnesium 1.6, TC 183, TG 80, HDL 46, LDL 121  Past Medical History:  Diagnosis Date  . Anemia    vitamin b and d deficiency  . Anxiety   . Arthritis    osteoarthrits  . Chronic pain syndrome   . CKD (chronic kidney disease) stage 3, GFR 30-59 ml/min   . Complication of anesthesia    difficulty waking up  . DDD (degenerative disc disease), cervical   . Fibromyalgia    not accurate, per patient  . GERD (gastroesophageal reflux disease)   . Gout   . Hearing loss   . History of kidney stones 1987  . Hypertension   . Rheumatoid arthritis (HCC)    orencia infusions  . Vitamin B12 deficiency   . Vitamin D deficiency     Past Surgical History:  Procedure Laterality Date  . ABDOMINAL HYSTERECTOMY    . ABDOMINAL HYSTERECTOMY    . [place], [place].   METAL IN ANKLE  . BREAST BIOPSY Right 02/09/2019   affirm bx of calcs, ribbon marker, path pending  .  CESAREAN SECTION    . CHOLECYSTECTOMY    . EYE SURGERY Bilateral 2015   cataract surgery, [place], [place]  . KNEE ARTHROSCOPY    . PULMONARY THROMBECTOMY N/A 03/10/2020   Procedure: PULMONARY THROMBECTOMY;  Surgeon: Algernon Huxley, MD;  Location: [facility];  Service: Cardiovascular;  Laterality: N/A;  . TONSILLECTOMY    . TOTAL KNEE ARTHROPLASTY Left 09/21/2019   Procedure: TOTAL KNEE ARTHROPLASTY;  Surgeon: Corky Mull, MD;  Location: [facility];  Service: Orthopedics;   Laterality: Left;  . TUBAL LIGATION      Current Medications: Current Meds  Medication Sig  . Abatacept 125 MG/ML SOSY Inject 125 mg into the skin every 30 (thirty) days. Next dose March 15th  . allopurinol (ZYLOPRIM) 300 MG tablet Take 300 mg by mouth daily. In the morning  . APIXABAN (ELIQUIS) VTE STARTER PACK (10MG  AND 5MG ) Take as directed on package: start with two-5mg  tablets twice daily for 7 days. On day 8, switch to one-5mg  tablet twice daily.  Marland Kitchen ascorbic acid (VITAMIN C) 500 MG tablet Take 1 tablet by mouth daily.  Marland Kitchen atorvastatin (LIPITOR) 40 MG tablet Take 1 tablet (40 mg total) by mouth daily.  . Biotin 5000 MCG TABS Take 5,000 mcg by mouth daily.  . busPIRone (BUSPAR) 5 MG tablet Take 5 mg by mouth 4 (four) times daily as needed (anxiety).   . Cyanocobalamin (VITAMIN B-12) 5000 MCG TBDP Take 5,000 mcg by mouth daily.  . ferrous sulfate 325 (65 FE) MG tablet Take 1 tablet by mouth daily.  . fluticasone (FLONASE) 50 MCG/ACT nasal spray Place 2 sprays into both nostrils daily.  . folic acid (FOLVITE) 829 MCG tablet Take 400 mcg by mouth daily.  Marland Kitchen gabapentin (NEURONTIN) 100 MG capsule Take 1 capsule (100 mg total) by mouth 3 (three) times daily for 14 days.  Marland Kitchen labetalol (NORMODYNE) 100 MG tablet Take 1 tablet (100 mg total) by mouth 2 (two) times daily.  Marland Kitchen lubiprostone (AMITIZA) 24 MCG capsule Take 24 mcg by mouth 2 (two) times daily as needed for constipation.   . magnesium oxide (MAG-OX) 400 MG tablet Take 1 tablet by mouth daily.  . methotrexate 50 MG/2ML injection Inject 12.5 mg into the vein every Thursday. (0.5 ml)  . nystatin cream (MYCOSTATIN) Apply 1 application topically 2 (two) times daily as needed for dry skin.  Marland Kitchen omeprazole (PRILOSEC) 20 MG capsule Take 20 mg by mouth daily before breakfast.   . oxyCODONE (OXY IR/ROXICODONE) 5 MG immediate release tablet Take 1-2 tablets (5-10 mg total) by mouth every 4 (four) hours as needed for moderate pain (pain score 4-6).  Marland Kitchen  tiZANidine (ZANAFLEX) 4 MG capsule Take 4 mg by mouth 2 (two) times daily as needed for muscle spasms.  . traMADol (ULTRAM) 50 MG tablet Take 1 tablet (50 mg total) by mouth every 6 (six) hours as needed for moderate pain.    Allergies:   Penicillins   Social History   Socioeconomic History  . Marital status: Widowed    Spouse name: Not on file  . Number of children: Not on file  . Years of education: Not on file  . Highest education level: Not on file  Occupational History  . Occupation: case Freight forwarder for [employer]    Comment: RETIRED  Tobacco Use  . Smoking status: Never Smoker  . Smokeless tobacco: Never Used  Vaping Use  . Vaping Use: Never used  Substance and Sexual Activity  . Alcohol use:  No  . Drug use: No  . Sexual activity: Not on file  Other Topics Concern  . Not on file  Social History Narrative   Patient lives alone. Not sure if anyone will be able to stay with her after surgery.     She may look into going to a rehab facility if needed.   Social Determinants of Health   Financial Resource Strain:   . Difficulty of Paying Living Expenses: Not on file  Food Insecurity:   . Worried About Charity fundraiser in the Last Year: Not on file  . Ran Out of Food in the Last Year: Not on file  Transportation Needs:   . Lack of Transportation (Medical): Not on file  . Lack of Transportation (Non-Medical): Not on file  Physical Activity:   . Days of Exercise per Week: Not on file  . Minutes of Exercise per Session: Not on file  Stress:   . Feeling of Stress : Not on file  Social Connections:   . Frequency of Communication with Friends and Family: Not on file  . Frequency of Social Gatherings with Friends and Family: Not on file  . Attends Religious Services: Not on file  . Active Member of Clubs or Organizations: Not on file  . Attends Archivist Meetings: Not on file  . Marital Status: Not on file     Family History:  The patient's family history  includes Breast cancer (age of onset: 45) in an other family member; Hypertension in her mother; Migraines in her mother; Other in her father.  ROS:   Review of Systems  Constitutional: Positive for malaise/fatigue. Negative for chills, diaphoresis, fever and weight loss.  HENT: Positive for nosebleeds. Negative for congestion.   Eyes: Negative for discharge and redness.  Respiratory: Positive for shortness of breath. Negative for cough, sputum production and wheezing.   Cardiovascular: Negative for chest pain, palpitations, orthopnea, claudication, leg swelling and PND.  Gastrointestinal: Negative for abdominal pain, blood in stool, heartburn, melena, nausea and vomiting.  Musculoskeletal: Positive for back pain. Negative for falls and myalgias.  Skin: Negative for rash.  Neurological: Positive for weakness. Negative for dizziness, tingling, tremors, sensory change, speech change, focal weakness and loss of consciousness.  Endo/Heme/Allergies: Bruises/bleeds easily.  Psychiatric/Behavioral: Negative for substance abuse. The patient is not nervous/anxious.   All other systems reviewed and are negative.    EKGs/Labs/Other Studies Reviewed:    Studies reviewed were summarized above. The additional studies were reviewed today:  2D echo 03/09/2020: 1. Left ventricular ejection fraction, by estimation, is 55 to 60%. The  left ventricle has normal function. The left ventricle has no regional  wall motion abnormalities. Left ventricular diastolic parameters are  consistent with Grade I diastolic  dysfunction (impaired relaxation).  2. Right ventricular systolic function is mildly reduced. The right  ventricular size is severely enlarged. There is severely elevated  pulmonary artery systolic pressure. The estimated right ventricular  systolic pressure is 94.7 mmHg.  3. Right atrial size was moderately dilated.   EKG:  EKG is ordered today.  The EKG ordered today demonstrates NSR, 68 bpm,  inferior and anterior T wave inversion largely unchanged from prior  Recent Labs: 09/17/2019: ALT 13 03/13/2020: BUN 35; Creatinine, Ser 1.94; Hemoglobin 10.1; Magnesium 1.6; Platelets 102; Potassium 3.9; Sodium 139  Recent Lipid Panel    Component Value Date/Time   CHOL 183 03/09/2020 0557   TRIG 80 03/09/2020 0557   HDL 46 03/09/2020 0557  CHOLHDL 4.0 03/09/2020 0557   VLDL 16 03/09/2020 0557   LDLCALC 121 (H) 03/09/2020 0557    PHYSICAL EXAM:    VS:  BP 124/60 (BP Location: Left Arm, Patient Position: Sitting, Cuff Size: Large)   Pulse 68   Ht 5\' 5"  (1.651 m)   Wt 242 lb 4 oz (109.9 kg)   SpO2 99% Comment: on 3L O2  BMI 40.31 kg/m   BMI: Body mass index is 40.31 kg/m.  Physical Exam Constitutional:      Appearance: She is well-developed.  HENT:     Head: Normocephalic and atraumatic.  Eyes:     General:        Right eye: No discharge.        Left eye: No discharge.  Neck:     Vascular: No JVD.  Cardiovascular:     Rate and Rhythm: Normal rate and regular rhythm.     Pulses: No midsystolic click and no opening snap.          Dorsalis pedis pulses are 2+ on the right side and 2+ on the left side.       Posterior tibial pulses are 2+ on the right side and 2+ on the left side.     Heart sounds: Normal heart sounds, S1 normal and S2 normal. Heart sounds not distant. No murmur heard.  No friction rub.  Pulmonary:     Effort: Pulmonary effort is normal. No respiratory distress.     Breath sounds: Normal breath sounds. No decreased breath sounds, wheezing or rales.  Chest:     Chest wall: No tenderness.  Abdominal:     General: There is no distension.     Palpations: Abdomen is soft.     Tenderness: There is no abdominal tenderness.  Musculoskeletal:     Cervical back: Normal range of motion.     Right lower leg: Edema present.     Left lower leg: Edema present.     Comments: Trace bilateral ankle edema  Skin:    General: Skin is warm and dry.     Nails: There  is no clubbing.  Neurological:     Mental Status: She is alert and oriented to person, place, and time.  Psychiatric:        Speech: Speech normal.        Behavior: Behavior normal.        Thought Content: Thought content normal.        Judgment: Judgment normal.     Wt Readings from Last 3 Encounters:  03/27/20 242 lb 4 oz (109.9 kg)  03/13/20 234 lb 5.6 oz (106.3 kg)  09/23/19 233 lb 11 oz (106 kg)     ASSESSMENT & PLAN:   1. Acute bilateral PE s/p thrombolysis and thrombectomy/right popliteal DVT: Possibly in the setting of left TKA in 08/2019. Eliquis duration per PCP. Sees hematology 8/31.  She has noted some epistaxis, possibly in setting of dry nasal mucosa with supplemental oxygen.  She has been advised previously to use saline nasal spray or small amount of Vaseline.  If symptoms persist she could discuss ENT evaluation with PCP.  2. Demand ischemia/coronary artery calcification: Her elevated troponin was in the setting of the above.  Recommend risk factor modification and medical therapy to prevent progression of underlying disease.  For now, she is on Eliquis in place of aspirin.  When/if Eliquis is discontinued recommend transitioning to ASA 81 mg daily.  She will otherwise continue statin and  beta-blocker.  3. Chronic hypoxic respiratory failure: In the setting of her PE.  She remains on supplemental oxygen at 3 L via nasal cannula.  Follow-up with PCP as directed.  Working with home health PT/OT.  4. CKD stage III: Followed by nephrology.   5. HTN: Blood pressure is well controlled in the office today.  She remains on labetalol.  6. HLD: LDL 121 during admission in 02/2020.  Now on atorvastatin 40 mg daily.  Goal LDL less than 70 with coronary artery calcification noted.  Follow-up fasting lipid panel and liver function in approximately 2 months.  7. Hypomagnesemia: Taking magnesium oxide.  8. Anemia of chronic disease: Low, though stable on most recent check last week.   Recommend monitoring with PCP while on Eliquis.   Disposition: F/u with Dr. Rockey Situ or an APP in 2 months.   Medication Adjustments/Labs and Tests Ordered: Current medicines are reviewed at length with the patient today.  Concerns regarding medicines are outlined above. Medication changes, Labs and Tests ordered today are summarized above and listed in the Patient Instructions accessible in Encounters.   Signed, Christell Faith, PA-C 03/27/2020 10:06 AM     [address], [place] [address] [phone]

## 2020-03-27 ENCOUNTER — Other Ambulatory Visit: Payer: Self-pay

## 2020-03-27 ENCOUNTER — Encounter: Payer: Self-pay | Admitting: Physician Assistant

## 2020-03-27 ENCOUNTER — Ambulatory Visit (INDEPENDENT_AMBULATORY_CARE_PROVIDER_SITE_OTHER): Payer: Medicare Other | Admitting: Physician Assistant

## 2020-03-27 VITALS — BP 124/60 | HR 68 | Ht 65.0 in | Wt 242.2 lb

## 2020-03-27 DIAGNOSIS — I248 Other forms of acute ischemic heart disease: Secondary | ICD-10-CM | POA: Diagnosis not present

## 2020-03-27 DIAGNOSIS — N183 Chronic kidney disease, stage 3 unspecified: Secondary | ICD-10-CM | POA: Diagnosis not present

## 2020-03-27 DIAGNOSIS — I249 Acute ischemic heart disease, unspecified: Secondary | ICD-10-CM

## 2020-03-27 DIAGNOSIS — I10 Essential (primary) hypertension: Secondary | ICD-10-CM

## 2020-03-27 DIAGNOSIS — I2699 Other pulmonary embolism without acute cor pulmonale: Secondary | ICD-10-CM | POA: Diagnosis not present

## 2020-03-27 DIAGNOSIS — J9611 Chronic respiratory failure with hypoxia: Secondary | ICD-10-CM

## 2020-03-27 DIAGNOSIS — E785 Hyperlipidemia, unspecified: Secondary | ICD-10-CM

## 2020-03-27 NOTE — Progress Notes (Signed)
[facility]  [address], [address] [place], [place] [address] Phone: [phone]  Fax: [phone]   Clinic Day:  03/28/2020  Referring physician: Kayleen Memos, DO  Chief Complaint: Sabrina Green is a 74 y.o. female with a submassive PE and right popliteal DVT (02/2020), anemia and thrombocytopenia who is referred in consultation by Dr. Irene Pap for assessment and management.   HPI: The patient saw Dr. Sarajane Jews with sudden onset weakness, nausea, diaphoresis, and shortness of breath x 1 day. She was referred to the emergency room for evaluation.  The patient was admitted to [facility] from 03/08/2020 - 03/13/2020 with shortness of breath, dizziness, and fatigue. D-dimers were > 7500.  Troponin was 615.  EKG showed new inverted T waves on anterior leads. She was started on heparin for suspected non-STEMI. CT angiogram with contrast was positive for acute PE with right heart strain (RV/LV Ratio = 1.5) consistent with at least submassive (intermediate risk) PE. There were large bilateral pulmonary emboli involving the central pulmonary arteries extending into the lobar branches.  Bilateral lower extremity venous duplex revealed a right popliteal DVT. She underwent pulmonary thrombectomy and catheter directed TPA on 03/10/2020 by Dr. Lucky Cowboy. She was discharged on Eliquis and supplemental oxygen.  Because of persistent hypoxia, she underwent chest CT on 03/11/2020.  Imaging revealed ground glass opacities within both lung favoring pulmonary hemorrhage.  She saw Christell Faith, PA in the cardiology clinic on 03/27/2020.  She was doing well from a cardiac perspective. She had mild bilateral lower extremity swelling and some epistaxis. She remained on 3 liters/min oxygen.  She was noted to have undergone left total knee arthroplasty on 09/21/2019. She was on a blood thinner for about 2 weeks after the surgery.  CBC on 03/13/2020 revealed a hematocrit of 30.7, hemoglobin 10.1,  MCV 89.8, platelets 102,000, WBC 9000.  Ferritin was 345 and folate > 22.3 on 03/06/2018.  TSH was normal. B12 was 476 on 03/06/2018 and 358 on 05/6/20219.  SPEP and UPEP in 01/2017 revealed no monoclonal protein.  Hepatitis B and C testing was negative on 03/06/2016.  The patient has chronic thrombocytopenia. Platelet count has been followed: 145,000 on 02/26/2016, 169,000 on 06/02/2017, 132,000 on 09/07/2017, 135,000 on 09/23/2019, and 102,000 on 03/13/2020.  Symptomatically, she has been "ok".  She reports shortness of breath on exertion; she is currently on 3 liters/min oxygen. She woke up short of breath last night but usually sleeps through the night. She reports headaches and vision changes. She sees an eye doctor yearly. She has a runny nose.  The patient states that she has been anemic for "a while".  Her diet consists of cabbage, salads, chicken, and fish. She denies ice pica and any other cravings. She denies restless legs, though she notes that sometimes she feels like "something is crawling on her leg at night." She just started oral iron and has taken Vitamin M54 and folic acid for a while. She does not use herbal products or drinks quinine water. She recently switched her blood pressure medication to labetalol. She has never had a blood transfusion and denies known exposure to hepatitis.  The patient denies fevers, sweats, sore throat, cough, shortness of breath, chest pain, palpitations, nausea, vomiting, diarrhea, reflux, urinary symptoms, bone symptoms, skin changes, numbness, weakness, balance or coordination problems, and bleeding of any kind.  The patient has rheumatoid arthritis. She has been getting Orencia infusions monthly for about 2 years. This is managed by Dr. Posey Pronto.  The patient gets yearly mammograms. She needs to reschedule this year's because it was supposed to be while she was in the hospital. She was due for a colonoscopy in 2019 and has an appointment to discuss this  with a doctor later this year.  She consents to hepatitis and HIV testing.  The patient's sister had lung cancer. She denies any family history of blood disorders.   Past Medical History:  Diagnosis Date  . Anemia    vitamin b and d deficiency  . Anxiety   . Arthritis    osteoarthrits  . Chronic pain syndrome   . CKD (chronic kidney disease) stage 3, GFR 30-59 ml/min   . Complication of anesthesia    difficulty waking up  . DDD (degenerative disc disease), cervical   . Fibromyalgia    not accurate, per patient  . GERD (gastroesophageal reflux disease)   . Gout   . Hearing loss   . History of kidney stones 1987  . Hypertension   . Rheumatoid arthritis (HCC)    orencia infusions  . Vitamin B12 deficiency   . Vitamin D deficiency     Past Surgical History:  Procedure Laterality Date  . ABDOMINAL HYSTERECTOMY    . ABDOMINAL HYSTERECTOMY    . [place], [place].   METAL IN ANKLE  . BREAST BIOPSY Right 02/09/2019   affirm bx of calcs, ribbon marker, path pending  . CESAREAN SECTION    . CHOLECYSTECTOMY    . EYE SURGERY Bilateral 2015   cataract surgery, [place], [place]  . KNEE ARTHROSCOPY    . PULMONARY THROMBECTOMY N/A 03/10/2020   Procedure: PULMONARY THROMBECTOMY;  Surgeon: Algernon Huxley, MD;  Location: [facility];  Service: Cardiovascular;  Laterality: N/A;  . TONSILLECTOMY    . TOTAL KNEE ARTHROPLASTY Left 09/21/2019   Procedure: TOTAL KNEE ARTHROPLASTY;  Surgeon: Corky Mull, MD;  Location: [facility];  Service: Orthopedics;  Laterality: Left;  . TUBAL LIGATION      Family History  Problem Relation Age of Onset  . Breast cancer Other 50  . Hypertension Mother   . Migraines Mother   . Other Father   . Lung cancer Sister     Social History:  reports that she has never smoked. She has never used smokeless tobacco. She reports that she does not drink alcohol and does not use drugs. The patient denies  alcohol or tobacco use. She denies exposure to radiation or toxins. The patient is retired. The patient is accompanied by her sister, Vella Kohler, today.  Allergies:  Allergies  Allergen Reactions  . Penicillins Hives    Current Medications: Current Outpatient Medications  Medication Sig Dispense Refill  . Abatacept 125 MG/ML SOSY Inject 125 mg into the skin every 30 (thirty) days. Next dose March 15th    . allopurinol (ZYLOPRIM) 300 MG tablet Take 300 mg by mouth daily. In the morning    . APIXABAN (ELIQUIS) VTE STARTER PACK (10MG  AND 5MG ) Take as directed on package: start with two-5mg  tablets twice daily for 7 days. On day 8, switch to one-5mg  tablet twice daily. 1 each 0  . ascorbic acid (VITAMIN C) 500 MG tablet Take 1 tablet by mouth daily.    Marland Kitchen atorvastatin (LIPITOR) 40 MG tablet Take 1 tablet (40 mg total) by mouth daily. 60 tablet 0  . Biotin 5000 MCG TABS Take 5,000 mcg by mouth daily.    . Cyanocobalamin (VITAMIN B-12)  5000 MCG TBDP Take 5,000 mcg by mouth daily.    . ferrous sulfate 325 (65 FE) MG tablet Take 1 tablet by mouth daily.    . fluticasone (FLONASE) 50 MCG/ACT nasal spray Place 2 sprays into both nostrils daily.    . folic acid (FOLVITE) 295 MCG tablet Take 400 mcg by mouth daily.    Marland Kitchen gabapentin (NEURONTIN) 100 MG capsule Take 1 capsule (100 mg total) by mouth 3 (three) times daily for 14 days. 42 capsule 0  . labetalol (NORMODYNE) 100 MG tablet Take 1 tablet (100 mg total) by mouth 2 (two) times daily. 120 tablet 0  . lubiprostone (AMITIZA) 24 MCG capsule Take 24 mcg by mouth 2 (two) times daily as needed for constipation.     . magnesium oxide (MAG-OX) 400 MG tablet Take 1 tablet by mouth daily.    . methotrexate 50 MG/2ML injection Inject 12.5 mg into the vein every Thursday. (0.5 ml)    . omeprazole (PRILOSEC) 20 MG capsule Take 20 mg by mouth daily before breakfast.     . tiZANidine (ZANAFLEX) 4 MG capsule Take 4 mg by mouth 2 (two) times daily as needed for  muscle spasms.    . busPIRone (BUSPAR) 5 MG tablet Take 5 mg by mouth 4 (four) times daily as needed (anxiety).  (Patient not taking: Reported on 03/28/2020)    . nystatin cream (MYCOSTATIN) Apply 1 application topically 2 (two) times daily.    Marland Kitchen nystatin cream (MYCOSTATIN) Apply 1 application topically 2 (two) times daily as needed for dry skin. (Patient not taking: Reported on 03/28/2020)    . oxyCODONE (OXY IR/ROXICODONE) 5 MG immediate release tablet Take 1-2 tablets (5-10 mg total) by mouth every 4 (four) hours as needed for moderate pain (pain score 4-6). (Patient not taking: Reported on 03/28/2020) 60 tablet 0  . traMADol (ULTRAM) 50 MG tablet Take 1 tablet (50 mg total) by mouth every 6 (six) hours as needed for moderate pain. (Patient not taking: Reported on 03/28/2020) 30 tablet 0   No current facility-administered medications for this visit.    Review of Systems  Constitutional: Negative for chills, diaphoresis, fever, malaise/fatigue and weight loss.  HENT: Negative for congestion, ear discharge, ear pain, hearing loss, nosebleeds, sinus pain, sore throat and tinnitus.        Runny nose.  Eyes:       Reports vision changes.  Respiratory: Positive for shortness of breath (on exertion). Negative for cough, hemoptysis and sputum production.        Currently on 3 liters/min oxygen.  Cardiovascular: Negative for chest pain, palpitations and leg swelling.  Gastrointestinal: Negative for abdominal pain, blood in stool, constipation, diarrhea, heartburn, melena, nausea and vomiting.       Denies ice pica.  Genitourinary: Negative for dysuria, frequency, hematuria and urgency.  Musculoskeletal: Negative for back pain, joint pain (s/p left TKA in 08/2019), myalgias and neck pain.       Rheumatoid arthritis.  Skin: Negative for itching and rash.  Neurological: Positive for headaches. Negative for dizziness, tingling, sensory change and weakness.       Sometimes feels like "something is  crawling on her leg at night." Denies restless legs.  Endo/Heme/Allergies: Does not bruise/bleed easily.  Psychiatric/Behavioral: Negative for depression and memory loss. The patient is not nervous/anxious and does not have insomnia.   All other systems reviewed and are negative.  Performance status (ECOG): 1  Vitals Blood pressure (!) 125/51, pulse 65, temperature 97.8 F (36.6  C), temperature source Tympanic, resp. rate 18, height 5\' 5"  (1.651 m), weight 242 lb 13.4 oz (110.2 kg), SpO2 100 %.   Physical Exam Vitals and nursing note reviewed.  Constitutional:      General: She is not in acute distress.    Appearance: She is not diaphoretic.     Comments: She requires assistance onto exam table.  HENT:     Head: Normocephalic and atraumatic.     Comments: Short black hair.    Mouth/Throat:     Mouth: Mucous membranes are moist.     Pharynx: Oropharynx is clear.  Eyes:     General: No scleral icterus.    Extraocular Movements: Extraocular movements intact.     Conjunctiva/sclera: Conjunctivae normal.     Pupils: Pupils are equal, round, and reactive to light.  Cardiovascular:     Rate and Rhythm: Normal rate and regular rhythm.     Heart sounds: Normal heart sounds. No murmur heard.   Pulmonary:     Effort: Pulmonary effort is normal. No respiratory distress.     Breath sounds: Normal breath sounds. No wheezing or rales.  Chest:     Chest wall: No tenderness.  Abdominal:     General: Bowel sounds are normal. There is no distension.     Palpations: Abdomen is soft. There is no hepatomegaly, splenomegaly or mass.     Tenderness: There is no abdominal tenderness. There is no guarding or rebound.  Musculoskeletal:        General: No swelling or tenderness. Normal range of motion.     Cervical back: Normal range of motion and neck supple.  Lymphadenopathy:     Head:     Right side of head: No preauricular, posterior auricular or occipital adenopathy.     Left side of head: No  preauricular, posterior auricular or occipital adenopathy.     Cervical: No cervical adenopathy.     Upper Body:     Right upper body: No supraclavicular or axillary adenopathy.     Left upper body: No supraclavicular or axillary adenopathy.     Lower Body: No right inguinal adenopathy. No left inguinal adenopathy.  Skin:    General: Skin is warm and dry.  Neurological:     Mental Status: She is alert and oriented to person, place, and time.  Psychiatric:        Behavior: Behavior normal.        Thought Content: Thought content normal.        Judgment: Judgment normal.     No visits with results within 3 Day(s) from this visit.  Latest known visit with results is:  No results displayed because visit has over 200 results.      Assessment:  Sabrina Green is a 74 y.o. female a submassive pulmonary embolism and right popliteal DVT. She underwent pulmonary thrombectomy and catheter directed TPA on 03/10/2020.  She is on Eliquis.  Chest CT angiogram revealed an acute PE with right heart strain (RV/LV Ratio = 1.5) c/w at least submassive (intermediate risk) PE. Bilateral lower extremity venous duplex revealed a right popliteal DVT.   She has chronic thrombocytopenia. Platelet count has been followed: 145,000 on 02/26/2016, 169,000 on 06/02/2017, 132,000 on 09/07/2017, 135,000 on 09/23/2019, and 102,000 on 03/13/2020.  She has B12 deficiency and is on oral B12.  She has renal insufficiency.  Creatinine was 1.94 (CrCl 32.8 ml/min) on 03/13/2020.  She has rheumatoid arthritis and has received Orencia infusions monthly for  about 2 years.  Symptomatically, she denies any fevers, sweats or weight loss.  Exam reveals no adenopathy or hepatosplenomegaly.  Plan: 1.   Labs today:  CBC with diff, CMP, factor V Leiden, prothrombin gene mutation, lupus anticoagulant panel, anticardiolipin antibodies, beta2-glycoprotein antibodies. 2.   Pulmonary embolism  She has had a submassive pulmonary  embolism.  Anticipate lifelong anticoagulation.  Discuss risk factors for thrombosis (pregnancy, obesity, malignancy, dehydration, pelvic/lower extremity surgery, immobility, clotting deficiencies).   Left knee surgery was 09/21/2019 and felt unrelated.  Discuss analogy of a basket floating on the water.  Risk factors for thrombosis are various sized rocks in the basket.   Enough rocks in the basket and the boat sinks.  Sinking represents thrombosis.  Orencia associated with increased risk of malignancy such as lymphoma.   Patient denies any B symptoms.   Consider abdomen and pelvis CT.  Discuss eliminating risk factors that can be controlled.  Discuss initial hypercoagulable work-up and additional testing in 3 months. 3.   Thrombocytopenia  She denies any bleeding or B symptoms.    Discuss differential diagnosis.  R/o pseudo-thrombocytopenia with platelet count in a blue top tube.  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  Check platelet count in a blue top tube, hepatitis B core antibody, hepatitis C antibody, and HIV testing (patient consents). 4.   Normocytic anemia  Hematocrit 30.7.  Hemoglobin 10.1.  MCV 89.8 on 03/13/2020.  SPEP and UPEP in 01/2017 revealed no monoclonal protein.   Etiology may be secondary to chronic renal insufficiency.  Check ferritin, iron studies, B12, folate, and retic. 5.   RTC in 1 week for MD assessment, review of work-up and discussion regarding direction of therapy.  I discussed the assessment and treatment plan with the patient.  The patient was provided an opportunity to ask questions and all were answered.  The patient agreed with the plan and demonstrated an understanding of the instructions.  The patient was advised to call back if the symptoms worsen or if the condition fails to improve as anticipated.  I provided 28 minutes of face-to-face time during this this encounter and > 50% was spent  counseling as documented under my assessment and plan.  An additional 10-15 minutes were spent reviewing her chart (Epic and Care Everywhere) including notes, labs, and imaging studies.    Connee Ikner C. Mike Gip, MD, PhD    03/28/2020, 2:40 PM  I, Mirian Mo Tufford, am acting as Education administrator for [employer]. Mike Gip, MD, PhD.  I, Doraine Schexnider C. Mike Gip, MD, have reviewed the above documentation for accuracy and completeness, and I agree with the above.

## 2020-03-27 NOTE — Patient Instructions (Signed)
Medication Instructions:  Your physician recommends that you continue on your current medications as directed. Please refer to the Current Medication list given to you today.  *If you need a refill on your cardiac medications before your next appointment, please call your pharmacy*   Lab Work: Your physician recommends that you return for lab work in: 8 weeks at the medical mall. You will need to be fasting. (lipid and liver FT) No appt is needed. Hours are M-F 7AM- 6 PM.  If you have labs (blood work) drawn today and your tests are completely normal, you will receive your results only by:  [place] (if you have MyChart) OR  A paper copy in the mail If you have any lab test that is abnormal or we need to change your treatment, we will call you to review the results.   Testing/Procedures:none ordered   Follow-Up: At [facility], you and your health needs are our priority.  As part of our continuing mission to provide you with exceptional heart care, we have created designated Provider Care Teams.  These Care Teams include your primary Cardiologist (physician) and Advanced Practice Providers (APPs -  Physician Assistants and Nurse Practitioners) who all work together to provide you with the care you need, when you need it.  We recommend signing up for the patient portal called "MyChart".  Sign up information is provided on this After Visit Summary.  MyChart is used to connect with patients for Virtual Visits (Telemedicine).  Patients are able to view lab/test results, encounter notes, upcoming appointments, etc.  Non-urgent messages can be sent to your provider as well.   To learn more about what you can do with MyChart, go to [email or web address].    Your next appointment:   2 month(s)  The format for your next appointment:   In Person  Provider:    You may see Ida Rogue, MD or Christell Faith, PA-

## 2020-03-28 ENCOUNTER — Encounter: Payer: Self-pay | Admitting: Hematology and Oncology

## 2020-03-28 ENCOUNTER — Inpatient Hospital Stay: Payer: Medicare Other

## 2020-03-28 ENCOUNTER — Inpatient Hospital Stay: Payer: Medicare Other | Attending: Hematology and Oncology | Admitting: Hematology and Oncology

## 2020-03-28 VITALS — BP 125/51 | HR 65 | Temp 97.8°F | Resp 18 | Ht 65.0 in | Wt 242.8 lb

## 2020-03-28 DIAGNOSIS — D649 Anemia, unspecified: Secondary | ICD-10-CM | POA: Diagnosis not present

## 2020-03-28 DIAGNOSIS — D696 Thrombocytopenia, unspecified: Secondary | ICD-10-CM | POA: Diagnosis not present

## 2020-03-28 DIAGNOSIS — I82431 Acute embolism and thrombosis of right popliteal vein: Secondary | ICD-10-CM | POA: Insufficient documentation

## 2020-03-28 DIAGNOSIS — E538 Deficiency of other specified B group vitamins: Secondary | ICD-10-CM | POA: Diagnosis not present

## 2020-03-28 DIAGNOSIS — I2699 Other pulmonary embolism without acute cor pulmonale: Secondary | ICD-10-CM | POA: Diagnosis present

## 2020-03-28 DIAGNOSIS — I2609 Other pulmonary embolism with acute cor pulmonale: Secondary | ICD-10-CM

## 2020-03-28 DIAGNOSIS — N1832 Chronic kidney disease, stage 3b: Secondary | ICD-10-CM | POA: Diagnosis not present

## 2020-03-28 DIAGNOSIS — M069 Rheumatoid arthritis, unspecified: Secondary | ICD-10-CM | POA: Diagnosis not present

## 2020-03-28 DIAGNOSIS — Z7901 Long term (current) use of anticoagulants: Secondary | ICD-10-CM | POA: Insufficient documentation

## 2020-03-28 LAB — COMPREHENSIVE METABOLIC PANEL
ALT: 13 U/L (ref 0–44)
AST: 20 U/L (ref 15–41)
Albumin: 3.6 g/dL (ref 3.5–5.0)
Alkaline Phosphatase: 74 U/L (ref 38–126)
Anion gap: 11 (ref 5–15)
BUN: 23 mg/dL (ref 8–23)
CO2: 26 mmol/L (ref 22–32)
Calcium: 9.1 mg/dL (ref 8.9–10.3)
Chloride: 107 mmol/L (ref 98–111)
Creatinine, Ser: 1.33 mg/dL — ABNORMAL HIGH (ref 0.44–1.00)
GFR calc Af Amer: 46 mL/min — ABNORMAL LOW (ref >60)
GFR calc non Af Amer: 40 mL/min — ABNORMAL LOW (ref >60)
Glucose, Bld: 87 mg/dL (ref 70–99)
Potassium: 3.9 mmol/L (ref 3.5–5.1)
Sodium: 144 mmol/L (ref 135–145)
Total Bilirubin: 0.5 mg/dL (ref 0.3–1.2)
Total Protein: 7.3 g/dL (ref 6.5–8.1)

## 2020-03-28 LAB — CBC WITH DIFFERENTIAL/PLATELET
Abs Immature Granulocytes: 0.02 10*3/uL (ref 0.00–0.07)
Basophils Absolute: 0 10*3/uL (ref 0.0–0.1)
Basophils Relative: 1 %
Eosinophils Absolute: 0.1 10*3/uL (ref 0.0–0.5)
Eosinophils Relative: 3 %
HCT: 29.9 % — ABNORMAL LOW (ref 36.0–46.0)
Hemoglobin: 9.1 g/dL — ABNORMAL LOW (ref 12.0–15.0)
Immature Granulocytes: 0 %
Lymphocytes Relative: 32 %
Lymphs Abs: 1.6 10*3/uL (ref 0.7–4.0)
MCH: 28.1 pg (ref 26.0–34.0)
MCHC: 30.4 g/dL (ref 30.0–36.0)
MCV: 92.3 fL (ref 80.0–100.0)
Monocytes Absolute: 0.3 10*3/uL (ref 0.1–1.0)
Monocytes Relative: 6 %
Neutro Abs: 3 10*3/uL (ref 1.7–7.7)
Neutrophils Relative %: 58 %
Platelets: 205 10*3/uL (ref 150–400)
RBC: 3.24 MIL/uL — ABNORMAL LOW (ref 3.87–5.11)
RDW: 16.7 % — ABNORMAL HIGH (ref 11.5–15.5)
WBC: 5.1 10*3/uL (ref 4.0–10.5)
nRBC: 0 % (ref 0.0–0.2)

## 2020-03-28 LAB — VITAMIN B12: Vitamin B-12: 1430 pg/mL — ABNORMAL HIGH (ref 180–914)

## 2020-03-28 LAB — IRON AND TIBC
Iron: 37 ug/dL (ref 28–170)
Saturation Ratios: 10 % — ABNORMAL LOW (ref 10.4–31.8)
TIBC: 358 ug/dL (ref 250–450)
UIBC: 321 ug/dL

## 2020-03-28 LAB — FERRITIN: Ferritin: 160 ng/mL (ref 11–307)

## 2020-03-28 LAB — FOLATE: Folate: 15.8 ng/mL (ref >5.9)

## 2020-03-28 LAB — RETICULOCYTES
Immature Retic Fract: 21.9 % — ABNORMAL HIGH (ref 2.3–15.9)
RBC.: 3.13 MIL/uL — ABNORMAL LOW (ref 3.87–5.11)
Retic Count, Absolute: 124.6 10*3/uL (ref 19.0–186.0)
Retic Ct Pct: 4 % — ABNORMAL HIGH (ref 0.4–3.1)

## 2020-03-28 LAB — PLATELET BY CITRATE: Platelet CT in Citrate: 194

## 2020-03-29 LAB — HEPATITIS C ANTIBODY: HCV Ab: NONREACTIVE

## 2020-03-29 LAB — ANA W/REFLEX: Anti Nuclear Antibody (ANA): NEGATIVE

## 2020-03-29 LAB — HEPATITIS B CORE ANTIBODY, TOTAL: Hep B Core Total Ab: NONREACTIVE

## 2020-03-29 LAB — HIV ANTIBODY (ROUTINE TESTING W REFLEX): HIV Screen 4th Generation wRfx: NONREACTIVE

## 2020-03-30 LAB — DRVVT CONFIRM: dRVVT Confirm: 1 ratio (ref 0.8–1.2)

## 2020-03-30 LAB — LUPUS ANTICOAGULANT PANEL
DRVVT: 67.3 s — ABNORMAL HIGH (ref 0.0–47.0)
PTT Lupus Anticoagulant: 37.8 s (ref 0.0–51.9)

## 2020-03-30 LAB — CARDIOLIPIN ANTIBODIES, IGG, IGM, IGA
Anticardiolipin IgA: <9 APL U/mL (ref 0–11)
Anticardiolipin IgG: <9 GPL U/mL (ref 0–14)
Anticardiolipin IgM: 10 MPL U/mL (ref 0–12)

## 2020-03-30 LAB — DRVVT MIX: dRVVT Mix: 49.7 s — ABNORMAL HIGH (ref 0.0–40.4)

## 2020-03-31 LAB — PROTHROMBIN GENE MUTATION

## 2020-03-31 LAB — FACTOR 5 LEIDEN

## 2020-04-03 DIAGNOSIS — D649 Anemia, unspecified: Secondary | ICD-10-CM | POA: Insufficient documentation

## 2020-04-03 NOTE — Progress Notes (Signed)
[facility]  [address] [place], [place] [address] Phone: [phone]  Fax: [phone]   Clinic Day:  04/04/2020  Referring physician: Sofie Hartigan, MD  Chief Complaint: Sabrina Green is a 74 y.o. female with a submassive PE and right popliteal DVT (02/2020), anemia and thrombocytopenia who is seen for review of work-up and discussion regarding direction of therapy.  HPI: The patient was last seen in the hematology clinic on 03/28/2020 for new patient assessment.  She presented on 03/08/2020 with shortness of breath, dizziness, and fatigue.  CT angiogram revealed a submassive PE.  Bilateral lower extremity duplex revealed a right popliteal DVT. She underwent thrombectomy and catheter directed TPA.  She was on Eliquis.  Symptomatically, she denied any fevers, sweats or weight loss.  Exam revealed no adenopathy or hepatosplenomegaly.  Work-up revealed a hematocrit of 29.9, hemoglobin 9.1, MCV 92.3, platelets 205,000, WBC 5,100. Platelet count in a blue top was 194,000. Creatinine was 1.33 (CrCl 46 ml/min). Ferritin was 160 with an iron saturation of 10% and a TIBC of 358. Reticulocyte count was 4.0%. Vitamin B12 was 1,430 and folate was 15.8. Hepatitis B core antibody, hepatitis C antibody, and HIV antibody were negative. ANA was negative. Factor V Leiden, prothrombin gene mutation, lupus anticoagulant panel, and anti-cardiolipin antibodies were negative.  Beta2-glycoprotein is pending.  During the interim, she has been having an "off day" today. She states that she wants to sleep, but is unable to be more specific about why she feels bad. She states that she had a physical therapy appointment this morning and walked further than usual, but felt fine afterwards. Her legs are swollen. She is currently on 3 liters/min of oxygen. She is still taking Eliquis and methotrexate. Denies issues with bruising or bleeding.   Past Medical History:  Diagnosis  Date  . Anemia    vitamin b and d deficiency  . Anxiety   . Arthritis    osteoarthrits  . Chronic pain syndrome   . CKD (chronic kidney disease) stage 3, GFR 30-59 ml/min   . Complication of anesthesia    difficulty waking up  . DDD (degenerative disc disease), cervical   . Fibromyalgia    not accurate, per patient  . GERD (gastroesophageal reflux disease)   . Gout   . Hearing loss   . History of kidney stones 1987  . Hypertension   . Rheumatoid arthritis (HCC)    orencia infusions  . Vitamin B12 deficiency   . Vitamin D deficiency     Past Surgical History:  Procedure Laterality Date  . ABDOMINAL HYSTERECTOMY    . ABDOMINAL HYSTERECTOMY    . [place], [place].   METAL IN ANKLE  . BREAST BIOPSY Right 02/09/2019   affirm bx of calcs, ribbon marker, path pending  . CESAREAN SECTION    . CHOLECYSTECTOMY    . EYE SURGERY Bilateral 2015   cataract surgery, [place], [place]  . KNEE ARTHROSCOPY    . PULMONARY THROMBECTOMY N/A 03/10/2020   Procedure: PULMONARY THROMBECTOMY;  Surgeon: Algernon Huxley, MD;  Location: [place] [facility];  Service: Cardiovascular;  Laterality: N/A;  . TONSILLECTOMY    . TOTAL KNEE ARTHROPLASTY Left 09/21/2019   Procedure: TOTAL KNEE ARTHROPLASTY;  Surgeon: Corky Mull, MD;  Location: [facility];  Service: Orthopedics;  Laterality: Left;  . TUBAL LIGATION      Family History  Problem Relation Age of Onset  .  Breast cancer Other 50  . Hypertension Mother   . Migraines Mother   . Other Father   . Lung cancer Sister     Social History:  reports that she has never smoked. She has never used smokeless tobacco. She reports that she does not drink alcohol and does not use drugs. The patient denies alcohol or tobacco use. She denies exposure to radiation or toxins. The patient is retired. The patient is accompanied by her mother in person and her aunt, Valrie Hart (medical POA), by phone today.   Allergies:  Allergies  Allergen Reactions  . Penicillins Hives    Current Medications: Current Outpatient Medications  Medication Sig Dispense Refill  . Abatacept 125 MG/ML SOSY Inject 125 mg into the skin every 30 (thirty) days. Next dose March 15th    . allopurinol (ZYLOPRIM) 300 MG tablet Take 300 mg by mouth daily. In the morning    . APIXABAN (ELIQUIS) VTE STARTER PACK (10MG  AND 5MG ) Take as directed on package: start with two-5mg  tablets twice daily for 7 days. On day 8, switch to one-5mg  tablet twice daily. 1 each 0  . ascorbic acid (VITAMIN C) 500 MG tablet Take 1 tablet by mouth daily.    Marland Kitchen atorvastatin (LIPITOR) 40 MG tablet Take 1 tablet (40 mg total) by mouth daily. 60 tablet 0  . Biotin 5000 MCG TABS Take 5,000 mcg by mouth daily.    . busPIRone (BUSPAR) 5 MG tablet Take 5 mg by mouth 4 (four) times daily as needed (anxiety).  (Patient not taking: Reported on 03/28/2020)    . Cyanocobalamin (VITAMIN B-12) 5000 MCG TBDP Take 5,000 mcg by mouth daily.    . ferrous sulfate 325 (65 FE) MG tablet Take 1 tablet by mouth daily.    . fluticasone (FLONASE) 50 MCG/ACT nasal spray Place 2 sprays into both nostrils daily.    . folic acid (FOLVITE) 867 MCG tablet Take 400 mcg by mouth daily.    Marland Kitchen gabapentin (NEURONTIN) 100 MG capsule Take 1 capsule (100 mg total) by mouth 3 (three) times daily for 14 days. 42 capsule 0  . labetalol (NORMODYNE) 100 MG tablet Take 1 tablet (100 mg total) by mouth 2 (two) times daily. 120 tablet 0  . lubiprostone (AMITIZA) 24 MCG capsule Take 24 mcg by mouth 2 (two) times daily as needed for constipation.     . magnesium oxide (MAG-OX) 400 MG tablet Take 1 tablet by mouth daily.    . methotrexate 50 MG/2ML injection Inject 12.5 mg into the vein every Thursday. (0.5 ml)    . nystatin cream (MYCOSTATIN) Apply 1 application topically 2 (two) times daily.    Marland Kitchen nystatin cream (MYCOSTATIN) Apply 1 application topically 2 (two) times daily as needed for dry skin.  (Patient not taking: Reported on 03/28/2020)    . omeprazole (PRILOSEC) 20 MG capsule Take 20 mg by mouth daily before breakfast.     . oxyCODONE (OXY IR/ROXICODONE) 5 MG immediate release tablet Take 1-2 tablets (5-10 mg total) by mouth every 4 (four) hours as needed for moderate pain (pain score 4-6). (Patient not taking: Reported on 03/28/2020) 60 tablet 0  . tiZANidine (ZANAFLEX) 4 MG capsule Take 4 mg by mouth 2 (two) times daily as needed for muscle spasms.    . traMADol (ULTRAM) 50 MG tablet Take 1 tablet (50 mg total) by mouth every 6 (six) hours as needed for moderate pain. (Patient not taking: Reported on 03/28/2020) 30 tablet 0   No  current facility-administered medications for this visit.    Review of Systems  Constitutional: Positive for malaise/fatigue. Negative for chills, diaphoresis, fever and weight loss (up 1 lb).       Feels "fine" but having an "off day".  HENT: Negative for congestion, ear discharge, ear pain, hearing loss, nosebleeds, sinus pain, sore throat and tinnitus.   Eyes:       Reports vision changes.  Respiratory: Negative for cough, hemoptysis, sputum production and shortness of breath.        Currently on 3 liters/min oxygen.  Cardiovascular: Positive for leg swelling. Negative for chest pain and palpitations.  Gastrointestinal: Negative for abdominal pain, blood in stool, constipation, diarrhea, heartburn, melena, nausea and vomiting.  Genitourinary: Negative for dysuria, frequency, hematuria and urgency.  Musculoskeletal: Negative for back pain, joint pain (s/p left TKA in 08/2019), myalgias and neck pain.       Rheumatoid arthritis.  Skin: Negative for itching and rash.  Neurological: Negative for dizziness, tingling, sensory change, weakness and headaches.  Endo/Heme/Allergies: Does not bruise/bleed easily.  Psychiatric/Behavioral: Negative for depression and memory loss. The patient is not nervous/anxious and does not have insomnia.   All other systems  reviewed and are negative.  Performance status (ECOG): 1  Vitals Blood pressure (!) 144/58, pulse 73, temperature (!) 97.5 F (36.4 C), temperature source Tympanic, resp. rate 19, weight 243 lb (110.2 kg), SpO2 100 %.   Physical Exam Vitals and nursing note reviewed.  Constitutional:      General: She is not in acute distress.    Appearance: She is not diaphoretic.     Comments: She requires assistance onto exam table.  HENT:     Head: Normocephalic and atraumatic.     Comments: Short black hair.    Mouth/Throat:     Mouth: Mucous membranes are moist.     Pharynx: Oropharynx is clear.  Eyes:     General: No scleral icterus.    Extraocular Movements: Extraocular movements intact.     Conjunctiva/sclera: Conjunctivae normal.     Pupils: Pupils are equal, round, and reactive to light.  Cardiovascular:     Rate and Rhythm: Normal rate and regular rhythm.     Heart sounds: Normal heart sounds. No murmur heard.   Pulmonary:     Effort: Pulmonary effort is normal. No respiratory distress.     Breath sounds: Normal breath sounds. No wheezing or rales.  Chest:     Chest wall: No tenderness.  Abdominal:     General: Bowel sounds are normal. There is no distension.     Palpations: Abdomen is soft. There is no hepatomegaly, splenomegaly or mass.     Tenderness: There is no abdominal tenderness. There is no guarding or rebound.  Musculoskeletal:        General: No swelling or tenderness. Normal range of motion.     Cervical back: Normal range of motion and neck supple.     Right lower leg: Edema present.     Left lower leg: Edema present.     Comments: Left thigh feels tighter than right.  Lymphadenopathy:     Head:     Right side of head: No preauricular, posterior auricular or occipital adenopathy.     Left side of head: No preauricular, posterior auricular or occipital adenopathy.     Cervical: No cervical adenopathy.     Upper Body:     Right upper body: No supraclavicular or  axillary adenopathy.     Left  upper body: No supraclavicular or axillary adenopathy.     Lower Body: No right inguinal adenopathy. No left inguinal adenopathy.  Skin:    General: Skin is warm and dry.  Neurological:     Mental Status: She is alert and oriented to person, place, and time.  Psychiatric:        Behavior: Behavior normal.        Thought Content: Thought content normal.        Judgment: Judgment normal.    No visits with results within 3 Day(s) from this visit.  Latest known visit with results is:  Clinical Support on 03/28/2020  Component Date Value Ref Range Status  . HIV Screen 4th Generation wRfx 03/28/2020 Non Reactive  Non Reactive Final   Performed at [facility], [place] [address] [place], [place] [address]  . HCV Ab 03/28/2020 NON REACTIVE  NON REACTIVE Final   Comment: (NOTE) Nonreactive HCV antibody screen is consistent with no HCV infections,  unless recent infection is suspected or other evidence exists to indicate HCV infection.  Performed at [facility], [place] [address], [place], [place] [address]   . Hep B Core Total Ab 03/28/2020 NON REACTIVE  NON REACTIVE Final   Performed at [facility], [place] [address], [place], [place] [address]  . Retic Ct Pct 03/28/2020 4.0* 0.4 - 3.1 % Final  . RBC. 03/28/2020 3.13* 3.87 - 5.11 MIL/uL Final  . Retic Count, Absolute 03/28/2020 124.6  19.0 - 186.0 K/uL Final  . Immature Retic Fract 03/28/2020 21.9* 2.3 - 15.9 % Final   Performed at [facility], [address], [place], [place] [address]  . Platelet CT in Citrate 03/28/2020 194   Final   Performed at [facility], [address] [place], [place] [address]  . Anticardiolipin IgG 03/28/2020 <9  0 - 14 GPL U/mL Final   Comment: (NOTE)                          Negative:              <15                          Indeterminate:     15 - 20                          Low-Med Positive: >20 - 80                          High  Positive:         >80   . Anticardiolipin IgM 03/28/2020 10  0 - 12 MPL U/mL Final   Comment: (NOTE)                          Negative:              <13                          Indeterminate:     13 - 20                          Low-Med Positive: >20 - 80  High Positive:         >80   . Anticardiolipin IgA 03/28/2020 <9  0 - 11 APL U/mL Final   Comment: (NOTE)                          Negative:              <12                          Indeterminate:     12 - 20                          Low-Med Positive: >20 - 80                          High Positive:         >80 Performed At: [facility] [place], [place] [id number] Rush Farmer MD OV:[id number]   . Anti Nuclear Antibody (ANA) 03/28/2020 Negative  Negative Final   Comment: (NOTE) Performed At: [facility] [place], [place] [id number] Rush Farmer MD ZS:[id number]   . PTT Lupus Anticoagulant 03/28/2020 37.8  0.0 - 51.9 sec Final  . DRVVT 03/28/2020 67.3* 0.0 - 47.0 sec Final  . Lupus Anticoag Interp 03/28/2020 Comment:   Corrected   Comment: (NOTE) No lupus anticoagulant was detected. These results are consistent with specific inhibitors to one or more common pathway factors (X, V, II or fibrinogen). Performed At: [facility] [place], [place] [id number] Rush Farmer MD CW:[id number]   . Recommendations-PTGENE: 03/28/2020 Comment   Final   Comment: (NOTE) NEGATIVE No mutation identified. Comment: A point mutation ([id number]) in the factor II (prothrombin) gene is the second most common cause of inherited thrombophilia. The incidence of this mutation in the [place] Caucasian population is about 2% and in the [place] American population it is approximately 0.5%. This mutation is rare in the [place] and Native American population. Being heterozygous for a prothrombin mutation increases the risk for developing venous thrombosis  about 2 to 3 times above the general population risk. Being homozygous for the prothrombin gene mutation increases the relative risk for venous thrombosis further, although it is not yet known how much further the risk is increased. In women heterozygous for the prothrombin gene mutation, the use of estrogen containing oral contraceptives increases the relative risk of venous thrombosis about 16 times and the risk of developing cerebral thrombosis is also significantly increased. In pregnancy the pr                          othrombin gene mutation increases risk for venous thrombosis and may increase risk for stillbirth, placental abruption, pre-eclampsia and fetal growth restriction. If the patient possesses two or more congenital or acquired thrombophilic risk factors, the risk for thrombosis may rise to more than the sum of the risk ratios for the individual mutations. This assay detects only the prothrombin G20210A mutation and does not measure genetic abnormalities elsewhere in the genome. Other thrombotic risk factors may be pursued through systematic clinical laboratory analysis. These factors include the R506Q (Leiden) mutation in the Factor V gene, plasma homocysteine levels, as well as testing for deficiencies of antithrombin III, protein C and protein S. Genetic Counselors are available for health care providers to  discuss results at [phone] [phone]). Methodology: DNA analysis of the Factor II gene was performed by PCR amplification followed by restriction analysis. The di                          agnostic sensitivity is >99% for both. All the tests must be combined with clinical information for the most accurate interpretation. Molecular-based testing is highly accurate, but as in any laboratory test, diagnostic errors may occur. This test was developed and its performance characteristics determined by [facility]. It has not been cleared or approved by the Food and Drug  Administration. Poort SR, et al. Blood. 1996; 96:2836-6294. Varga EA. Circulation. 2004; 765:Y65-K35. Mervin Hack, et [place]; 19:700-703. Allison Quarry, PhD, [facility] Ruben Reason, PhD, [facility] Ileene Hutchinson, PhD, [facility] Alfredo Bach, PhD, [facility] Norva Riffle, PhD, [facility] Earlean Polka, PhD, [facility] Performed At: [facility] [address] [place], [place] [id number] Katina Degree MDPhD TZ:[id number]   . Recommendations-F5LEID: 03/28/2020 Comment   Final   Comment: (NOTE) Result:  Negative (no mutation found) Factor V Leiden is a specific mutation (R506Q) in the factor V gene that is associated with an increased risk of venous thrombosis. Factor V Leiden is more resistant to inactivation by activated protein C.  As a result, factor V persists in the circulation leading to a mild hyper- coagulable state.  The Leiden mutation accounts for 90% - 95% of APC resistance.  Factor V Leiden has been reported in patients with deep vein thrombosis, pulmonary embolus, central retinal vein occlusion, cerebral sinus thrombosis and hepatic vein thrombosis. Other risk factors to be considered in the workup for venous thrombosis include the G20210A mutation in the factor II (prothrombin) gene, protein S and C deficiency, and antithrombin deficiencies. Anticardiolipin antibody and lupus anticoagulant analysis may be appropriate for certain patients, as well as homocysteine levels. Contact your local [facility] for information on how to order additi                          onal testing if desired. **Genetic counselors are available for health care providers to**  discuss results at [phone] [phone]). Methodology: DNA analysis of the Factor V gene was performed by allele-specific PCR. The diagnostic sensitivity and specificity is >99% for both. Molecular-based testing is highly accurate, but as in any laboratory test, diagnostic errors may occur. All  test results must be combined with clinical information for the most accurate interpretation. This test was developed and its performance characteristics determined by [facility]. It has not been cleared or approved by the Food and Drug Administration. References: Voelkerding K (1996).  Clin Lab Med [phone]. Allison Quarry, PhD, [facility] Ruben Reason, PhD, [facility] Ileene Hutchinson, PhD, [facility] Alfredo Bach, PhD, [facility] Norva Riffle, PhD, [facility] Earlean Polka PhD, [facility] Performed At: [facility] [address] [place], [place] [id number] Katina Degree MDPhD TJ:[id number]   . Folate 03/28/2020 15.8  >5.9 ng/mL Final   Performed at [facility], [place] [place], [place] [address]  . Vitamin B-12 03/28/2020 1,430* 180 - 914 pg/mL Final   Comment: (NOTE) This assay is not validated for testing neonatal or myeloproliferative syndrome specimens for Vitamin B12 levels. Performed at [facility], [place] [address] [place], [place] [address]   . Iron 03/28/2020 37  28 - 170 ug/dL Final  . TIBC 03/28/2020 358  250 -  450 ug/dL Final  . Saturation Ratios 03/28/2020 10* 10.4 - 31.8 % Final  . UIBC 03/28/2020 321  ug/dL Final   Performed at [facility], [place] [place], [place] [address]  . Ferritin 03/28/2020 160  11 - 307 ng/mL Final   Performed at [facility], [place] [place], [place] [address]  . Sodium 03/28/2020 144  135 - 145 mmol/L Final  . Potassium 03/28/2020 3.9  3.5 - 5.1 mmol/L Final  . Chloride 03/28/2020 107  98 - 111 mmol/L Final  . CO2 03/28/2020 26  22 - 32 mmol/L Final  . Glucose, Bld 03/28/2020 87  70 - 99 mg/dL Final   Glucose reference range applies only to samples taken after fasting for at least 8 hours.  . BUN 03/28/2020 23  8 - 23 mg/dL Final  . Creatinine, Ser 03/28/2020 1.33* 0.44 - 1.00 mg/dL Final  . Calcium 03/28/2020 9.1  8.9 - 10.3 mg/dL Final  . Total Protein 03/28/2020 7.3  6.5 - 8.1 g/dL  Final  . Albumin 03/28/2020 3.6  3.5 - 5.0 g/dL Final  . AST 03/28/2020 20  15 - 41 U/L Final  . ALT 03/28/2020 13  0 - 44 U/L Final  . Alkaline Phosphatase 03/28/2020 74  38 - 126 U/L Final  . Total Bilirubin 03/28/2020 0.5  0.3 - 1.2 mg/dL Final  . GFR calc non Af Amer 03/28/2020 40* >60 mL/min Final  . GFR calc Af Amer 03/28/2020 46* >60 mL/min Final  . Anion gap 03/28/2020 11  5 - 15 Final   Performed at [facility], [address], [place], [place] [address]  . WBC 03/28/2020 5.1  4.0 - 10.5 K/uL Final  . RBC 03/28/2020 3.24* 3.87 - 5.11 MIL/uL Final  . Hemoglobin 03/28/2020 9.1* 12.0 - 15.0 g/dL Final  . HCT 03/28/2020 29.9* 36 - 46 % Final  . MCV 03/28/2020 92.3  80.0 - 100.0 fL Final  . MCH 03/28/2020 28.1  26.0 - 34.0 pg Final  . MCHC 03/28/2020 30.4  30.0 - 36.0 g/dL Final  . RDW 03/28/2020 16.7* 11.5 - 15.5 % Final  . Platelets 03/28/2020 205  150 - 400 K/uL Final  . nRBC 03/28/2020 0.0  0.0 - 0.2 % Final  . Neutrophils Relative % 03/28/2020 58  % Final  . Neutro Abs 03/28/2020 3.0  1.7 - 7.7 K/uL Final  . Lymphocytes Relative 03/28/2020 32  % Final  . Lymphs Abs 03/28/2020 1.6  0.7 - 4.0 K/uL Final  . Monocytes Relative 03/28/2020 6  % Final  . Monocytes Absolute 03/28/2020 0.3  0 - 1 K/uL Final  . Eosinophils Relative 03/28/2020 3  % Final  . Eosinophils Absolute 03/28/2020 0.1  0 - 0 K/uL Final  . Basophils Relative 03/28/2020 1  % Final  . Basophils Absolute 03/28/2020 0.0  0 - 0 K/uL Final  . Immature Granulocytes 03/28/2020 0  % Final  . Abs Immature Granulocytes 03/28/2020 0.02  0.00 - 0.07 K/uL Final   Performed at [facility], [address] [place], [place] [address]  . dRVVT Mix 03/28/2020 49.7* 0.0 - 40.4 sec Final   Comment: (NOTE) Performed At: [facility] [place], [place] [id number] Rush Farmer MD FU:[id number]   . dRVVT Confirm 03/28/2020 1.0  0.8 - 1.2 ratio Final   Comment: (NOTE) Performed  At: [facility] [place], [place] [id number] Rush Farmer MD BJ:[id number]  Assessment:  Jeweline Reif is a 75 y.o. female a submassive pulmonary embolism and right popliteal DVT. She underwent pulmonary thrombectomy and catheter directed TPA on 03/10/2020.  She is on Eliquis.  Chest CT angiogram revealed an acute PE with right heart strain (RV/LV Ratio = 1.5) c/w at least submassive (intermediate risk) PE. Bilateral lower extremity venous duplex revealed a right popliteal DVT.   Work-up on 03/28/2020 revealed a hematocrit of 29.9, hemoglobin 9.1, MCV 92.3, platelets 205,000, WBC 5,100. Platelet count in a blue top was 194,000. Creatinine was 1.33 (CrCl 46 ml/min). Ferritin was 160 with an iron saturation of 10% and a TIBC of 358. Reticulocyte count was 4.0%. Vitamin B12 was 1,430 and folate was 15.8. Hepatitis B core antibody, hepatitis C antibody, and HIV antibody were negative. ANA was negative. Factor V Leiden, prothrombin gene mutation, lupus anticoagulant panel, and anti-cardiolipin antibodies were negative.  Beta2-glycoprotein is pending.  She has chronic thrombocytopenia. Platelet count has been followed: 145,000 on 02/26/2016, 169,000 on 06/02/2017, 132,000 on 09/07/2017, 135,000 on 09/23/2019, and 102,000 on 03/13/2020.  She has B12 deficiency and is on oral B12.  She has renal insufficiency.  Creatinine was 1.94 (CrCl 32.8 ml/min) on 03/13/2020.  She has rheumatoid arthritis and has received Orencia infusions monthly for about 2 years.  Symptomatically, she is having an "off day".  Exam reveals some lower extremity edema.   Plan: 1.   Review work-up to date. 2.   Pulmonary embolism  She has had a submassive pulmonary embolism.  Anticipate lifelong anticoagulation.  Discuss negative hypercoagulable work-up to date.  Orencia associated with increased risk of malignancy such as lymphoma.   Patient denies any B symptoms.   Patient agrees to abdomen  and pelvis CT.  Continue Eliquis.  Additional hypercoagulable work-up in 3 months (around mid 05/2020). 3.   Thrombocytopenia, resolved  Platelet count 205,000.  Pseudo-thrombocytopenia r/o with normal platelet count in a blue top tube.  ANA, hepatitis B core antibody, hepatitis C antibody, and HIV testing was negative.  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 4.   Normocytic anemia  Hematocrit 29.9.  Hemoglobin 9.1.  MCV 92.3 on 03/28/2020.  SPEP and UPEP in 01/2017 revealed no monoclonal protein (consider repeating).   Ferritin 116 with an iron saturation of 10% and a TIBC of 358.  B12 and folate were normal.  Bilirubin 0.5.  Retic was 4% indicating an appropriate marrow response.  Etiology may be secondary to chronic renal insufficiency (Cr 1.94 on 03/13/2020). 5.   Abdomen and pelvis CT without contrast (secondary to renal insufficiency). 6.   RTC after CT scan for MD assessment, labs (CBC with diff and +/- other studies), and review of imaging studies.  I discussed the assessment and treatment plan with the patient.  The patient was provided an opportunity to ask questions and all were answered.  The patient agreed with the plan and demonstrated an understanding of the instructions.  The patient was advised to call back if the symptoms worsen or if the condition fails to improve as anticipated.  I provided 18 minutes of face-to-face time during this this encounter and > 50% was spent counseling as documented under my assessment and plan.  An additional 5 minutes were spent reviewing her chart (Epic and Care Everywhere) including notes, labs, and imaging studies.    Melissa C. Mike Gip, MD, PhD    04/04/2020, 3:14 PM  I, Mirian Mo Tufford, am acting  as scribe for [place]. Mike Gip, MD, PhD.  I, Melissa C. Mike Gip, MD, have reviewed the above documentation for accuracy and completeness, and I agree with the above.

## 2020-04-04 ENCOUNTER — Inpatient Hospital Stay: Payer: Medicare Other | Attending: Hematology and Oncology | Admitting: Hematology and Oncology

## 2020-04-04 ENCOUNTER — Encounter: Payer: Self-pay | Admitting: Hematology and Oncology

## 2020-04-04 ENCOUNTER — Other Ambulatory Visit: Payer: Self-pay

## 2020-04-04 VITALS — BP 144/58 | HR 73 | Temp 97.5°F | Resp 19 | Wt 243.0 lb

## 2020-04-04 DIAGNOSIS — D696 Thrombocytopenia, unspecified: Secondary | ICD-10-CM | POA: Diagnosis not present

## 2020-04-04 DIAGNOSIS — I82431 Acute embolism and thrombosis of right popliteal vein: Secondary | ICD-10-CM | POA: Insufficient documentation

## 2020-04-04 DIAGNOSIS — I2699 Other pulmonary embolism without acute cor pulmonale: Secondary | ICD-10-CM | POA: Diagnosis not present

## 2020-04-04 DIAGNOSIS — I249 Acute ischemic heart disease, unspecified: Secondary | ICD-10-CM

## 2020-04-04 DIAGNOSIS — E538 Deficiency of other specified B group vitamins: Secondary | ICD-10-CM | POA: Diagnosis not present

## 2020-04-04 DIAGNOSIS — M069 Rheumatoid arthritis, unspecified: Secondary | ICD-10-CM | POA: Insufficient documentation

## 2020-04-04 DIAGNOSIS — I2609 Other pulmonary embolism with acute cor pulmonale: Secondary | ICD-10-CM

## 2020-04-04 DIAGNOSIS — Z7901 Long term (current) use of anticoagulants: Secondary | ICD-10-CM | POA: Diagnosis not present

## 2020-04-04 DIAGNOSIS — I824Z1 Acute embolism and thrombosis of unspecified deep veins of right distal lower extremity: Secondary | ICD-10-CM

## 2020-04-04 DIAGNOSIS — D649 Anemia, unspecified: Secondary | ICD-10-CM | POA: Insufficient documentation

## 2020-04-04 DIAGNOSIS — N1832 Chronic kidney disease, stage 3b: Secondary | ICD-10-CM | POA: Diagnosis not present

## 2020-04-04 NOTE — Progress Notes (Signed)
Patient wants to know why her legs keep swelling. Has to stay on top of oxygen to prevent dizziness

## 2020-04-07 ENCOUNTER — Other Ambulatory Visit: Payer: Self-pay

## 2020-04-07 ENCOUNTER — Ambulatory Visit
Admission: RE | Admit: 2020-04-07 | Discharge: 2020-04-07 | Disposition: A | Discharge status: Home / Self Care | Payer: Medicare Other | Source: Ambulatory Visit | Attending: Hematology and Oncology | Admitting: Hematology and Oncology

## 2020-04-07 DIAGNOSIS — D649 Anemia, unspecified: Secondary | ICD-10-CM | POA: Insufficient documentation

## 2020-04-07 DIAGNOSIS — I2609 Other pulmonary embolism with acute cor pulmonale: Secondary | ICD-10-CM | POA: Diagnosis present

## 2020-04-09 NOTE — Progress Notes (Signed)
[facility]  [address] [place], [place] [address] Phone: [phone]  Fax: [phone]   Clinic Day:  04/10/2020  Referring physician: Sofie Hartigan, MD  Chief Complaint: Sabrina Green is a 74 y.o. female with a submassive PE and right popliteal DVT (02/2020), anemia and thrombocytopenia who is seen for review of interval CT scan.  HPI: The patient was last seen in the hematology clinic on 04/04/2020. At that time, she was having an "off day".  Exam revealed some lower extremity edema. She continued Eliquis.  Abdomen and pelvis CT without contrast on 04/07/2020 revealed no findings to explain the patient's clinical history. There was residual inflammatory ground-glass in the lung bases, related to pulmonary emboli seen on 03/11/2020. There was aortic atherosclerosis and coronary artery calcification.  Mammogram is scheduled for 04/18/2020.  During the interim, she has been anxious because she is about to run out of her Eliquis. She has some bruising on her arms. She denies nosebleeds, gum bleeds, or blood in the stool. The patient does not have any oxygen with her today. She takes oral iron once daily and folic acid.   Past Medical History:  Diagnosis Date  . Anemia    vitamin b and d deficiency  . Anxiety   . Arthritis    osteoarthrits  . Chronic pain syndrome   . CKD (chronic kidney disease) stage 3, GFR 30-59 ml/min   . Complication of anesthesia    difficulty waking up  . DDD (degenerative disc disease), cervical   . Fibromyalgia    not accurate, per patient  . GERD (gastroesophageal reflux disease)   . Gout   . Hearing loss   . History of kidney stones 1987  . Hypertension   . Rheumatoid arthritis (HCC)    orencia infusions  . Vitamin B12 deficiency   . Vitamin D deficiency     Past Surgical History:  Procedure Laterality Date  . ABDOMINAL HYSTERECTOMY    . ABDOMINAL HYSTERECTOMY    . [place], [place].   METAL IN ANKLE  . BREAST BIOPSY Right 02/09/2019   affirm bx of calcs, ribbon marker, path pending  . CESAREAN SECTION    . CHOLECYSTECTOMY    . EYE SURGERY Bilateral 2015   cataract surgery, [place], [place]  . KNEE ARTHROSCOPY    . PULMONARY THROMBECTOMY N/A 03/10/2020   Procedure: PULMONARY THROMBECTOMY;  Surgeon: Algernon Huxley, MD;  Location: [facility];  Service: Cardiovascular;  Laterality: N/A;  . TONSILLECTOMY    . TOTAL KNEE ARTHROPLASTY Left 09/21/2019   Procedure: TOTAL KNEE ARTHROPLASTY;  Surgeon: Corky Mull, MD;  Location: [facility];  Service: Orthopedics;  Laterality: Left;  . TUBAL LIGATION      Family History  Problem Relation Age of Onset  . Breast cancer Other 50  . Hypertension Mother   . Migraines Mother   . Other Father   . Lung cancer Sister     Social History:  reports that she has never smoked. She has never used smokeless tobacco. She reports that she does not drink alcohol and does not use drugs. The patient denies alcohol or tobacco use. She denies exposure to radiation or toxins. The patient is retired. The patient is accompanied by her daughter today.  Her aunt, Sabrina Green, is her medical POA.  Allergies:  Allergies  Allergen Reactions  . Penicillins Hives    Current Medications: Current  Outpatient Medications  Medication Sig Dispense Refill  . Abatacept 125 MG/ML SOSY Inject 125 mg into the skin every 30 (thirty) days. Next dose March 15th    . allopurinol (ZYLOPRIM) 300 MG tablet Take 300 mg by mouth daily. In the morning    . APIXABAN (ELIQUIS) VTE STARTER PACK (10MG  AND 5MG ) Take as directed on package: start with two-5mg  tablets twice daily for 7 days. On day 8, switch to one-5mg  tablet twice daily. 1 each 0  . ascorbic acid (VITAMIN C) 500 MG tablet Take 1 tablet by mouth daily.    Marland Kitchen atorvastatin (LIPITOR) 40 MG tablet Take 1 tablet (40 mg total) by mouth daily. 60 tablet 0  .  Biotin 5000 MCG TABS Take 5,000 mcg by mouth daily.    . Cyanocobalamin (VITAMIN B-12) 5000 MCG TBDP Take 5,000 mcg by mouth daily.    . ferrous sulfate 325 (65 FE) MG tablet Take 1 tablet by mouth daily.    . fluticasone (FLONASE) 50 MCG/ACT nasal spray Place 2 sprays into both nostrils daily.    . folic acid (FOLVITE) 735 MCG tablet Take 400 mcg by mouth daily.    Marland Kitchen gabapentin (NEURONTIN) 100 MG capsule Take 1 capsule (100 mg total) by mouth 3 (three) times daily for 14 days. 42 capsule 0  . labetalol (NORMODYNE) 100 MG tablet Take 1 tablet (100 mg total) by mouth 2 (two) times daily. 120 tablet 0  . magnesium oxide (MAG-OX) 400 MG tablet Take 1 tablet by mouth daily.    . methotrexate 50 MG/2ML injection Inject 12.5 mg into the vein every Thursday. (0.5 ml)    . omeprazole (PRILOSEC) 20 MG capsule Take 20 mg by mouth daily before breakfast.     . tiZANidine (ZANAFLEX) 4 MG capsule Take 4 mg by mouth 2 (two) times daily as needed for muscle spasms.    . traMADol (ULTRAM) 50 MG tablet Take 1 tablet (50 mg total) by mouth every 6 (six) hours as needed for moderate pain. 30 tablet 0  . busPIRone (BUSPAR) 5 MG tablet Take 5 mg by mouth 4 (four) times daily as needed (anxiety).  (Patient not taking: Reported on 03/28/2020)    . lubiprostone (AMITIZA) 24 MCG capsule Take 24 mcg by mouth 2 (two) times daily as needed for constipation.  (Patient not taking: Reported on 04/10/2020)    . nystatin cream (MYCOSTATIN) Apply 1 application topically 2 (two) times daily. (Patient not taking: Reported on 04/10/2020)    . nystatin cream (MYCOSTATIN) Apply 1 application topically 2 (two) times daily as needed for dry skin. (Patient not taking: Reported on 03/28/2020)    . oxyCODONE (OXY IR/ROXICODONE) 5 MG immediate release tablet Take 1-2 tablets (5-10 mg total) by mouth every 4 (four) hours as needed for moderate pain (pain score 4-6). (Patient not taking: Reported on 03/28/2020) 60 tablet 0   No current  facility-administered medications for this visit.    Review of Systems  Constitutional: Negative for chills, diaphoresis, fever, malaise/fatigue and weight loss (up 1 lb).  HENT: Negative for congestion, ear discharge, ear pain, hearing loss, nosebleeds, sinus pain, sore throat and tinnitus.   Eyes: Negative for blurred vision and double vision.  Respiratory: Negative for cough, hemoptysis, sputum production and shortness of breath.        Currently on 3 liters/min oxygen, she did not bring her oxygen today.  Cardiovascular: Negative for chest pain, palpitations and leg swelling.  Gastrointestinal: Negative for abdominal pain, blood in stool,  constipation, diarrhea, heartburn, melena, nausea and vomiting.  Genitourinary: Negative for dysuria, frequency, hematuria and urgency.  Musculoskeletal: Negative for back pain, joint pain (s/p left TKA in 08/2019), myalgias and neck pain.       Rheumatoid arthritis.  Skin: Negative for itching and rash.  Neurological: Negative for dizziness, tingling, sensory change, weakness and headaches.  Endo/Heme/Allergies: Bruises/bleeds easily (bruises on arms, with Eliquis).  Psychiatric/Behavioral: Negative for depression and memory loss. The patient is nervous/anxious (about running out of Eliquis). The patient does not have insomnia.   All other systems reviewed and are negative.  Performance status (ECOG): 1  Vitals Blood pressure (!) 157/74, pulse 78, temperature (!) 96 F (35.6 C), temperature source Tympanic, resp. rate 18, weight 244 lb 14.9 oz (111.1 kg), SpO2 100 %.   Physical Exam Vitals and nursing note reviewed.  Constitutional:      General: She is not in acute distress.    Appearance: She is not diaphoretic.  HENT:     Head:     Comments: Short black hair. Eyes:     General: No scleral icterus.    Conjunctiva/sclera: Conjunctivae normal.  Neurological:     Mental Status: She is alert and oriented to person, place, and time.   Psychiatric:        Behavior: Behavior normal.        Thought Content: Thought content normal.        Judgment: Judgment normal.    No visits with results within 3 Day(s) from this visit.  Latest known visit with results is:  Clinical Support on 03/28/2020  Component Date Value Ref Range Status  . HIV Screen 4th Generation wRfx 03/28/2020 Non Reactive  Non Reactive Final   Performed at [facility], [place] [address] [place], [place] [address]  . HCV Ab 03/28/2020 NON REACTIVE  NON REACTIVE Final   Comment: (NOTE) Nonreactive HCV antibody screen is consistent with no HCV infections,  unless recent infection is suspected or other evidence exists to indicate HCV infection.  Performed at [facility], [place] [address] [place], [place] [address]   . Hep B Core Total Ab 03/28/2020 NON REACTIVE  NON REACTIVE Final   Performed at [facility], [place] [address], [place], [place] [address]  . Retic Ct Pct 03/28/2020 4.0* 0.4 - 3.1 % Final  . RBC. 03/28/2020 3.13* 3.87 - 5.11 MIL/uL Final  . Retic Count, Absolute 03/28/2020 124.6  19.0 - 186.0 K/uL Final  . Immature Retic Fract 03/28/2020 21.9* 2.3 - 15.9 % Final   Performed at [facility], [address] [place], [place] [address]  . Platelet CT in [place] 03/28/2020 194   Final   Performed at [facility], [address], [place], [place] [address]  . Anticardiolipin IgG 03/28/2020 <9  0 - 14 GPL U/mL Final   Comment: (NOTE)                          Negative:              <15                          Indeterminate:     15 - 20                          Low-Med Positive: >20 - 80  High Positive:         >80   . Anticardiolipin IgM 03/28/2020 10  0 - 12 MPL U/mL Final   Comment: (NOTE)                          Negative:              <13                          Indeterminate:     13 - 20                          Low-Med Positive: >20 - 80                          High  Positive:         >80   . Anticardiolipin IgA 03/28/2020 <9  0 - 11 APL U/mL Final   Comment: (NOTE)                          Negative:              <12                          Indeterminate:     12 - 20                          Low-Med Positive: >20 - 80                          High Positive:         >80 Performed At: [facility] [place], [place] [id number] Rush Farmer MD NW:[id number]   . Anti Nuclear Antibody (ANA) 03/28/2020 Negative  Negative Final   Comment: (NOTE) Performed At: [facility] [place], [place] [id number] Rush Farmer MD BM:[id number]   . PTT Lupus Anticoagulant 03/28/2020 37.8  0.0 - 51.9 sec Final  . DRVVT 03/28/2020 67.3* 0.0 - 47.0 sec Final  . Lupus Anticoag Interp 03/28/2020 Comment:   Corrected   Comment: (NOTE) No lupus anticoagulant was detected. These results are consistent with specific inhibitors to one or more common pathway factors (X, V, II or fibrinogen). Performed At: [facility] [place], [place] [id number] Rush Farmer MD IH:[id number]   . Recommendations-PTGENE: 03/28/2020 Comment   Final   Comment: (NOTE) NEGATIVE No mutation identified. Comment: A point mutation ([id number]) in the factor II (prothrombin) gene is the second most common cause of inherited thrombophilia. The incidence of this mutation in the [place] Caucasian population is about 2% and in the [place] American population it is approximately 0.5%. This mutation is rare in the [place] and Native American population. Being heterozygous for a prothrombin mutation increases the risk for developing venous thrombosis about 2 to 3 times above the general population risk. Being homozygous for the prothrombin gene mutation increases the relative risk for venous thrombosis further, although it is not yet known how much further the risk is increased. In women heterozygous for the prothrombin gene mutation, the  use of estrogen containing oral contraceptives increases the relative risk of venous thrombosis about 16 times and the risk  of developing cerebral thrombosis is also significantly increased. In pregnancy the pr                          othrombin gene mutation increases risk for venous thrombosis and may increase risk for stillbirth, placental abruption, pre-eclampsia and fetal growth restriction. If the patient possesses two or more congenital or acquired thrombophilic risk factors, the risk for thrombosis may rise to more than the sum of the risk ratios for the individual mutations. This assay detects only the prothrombin G20210A mutation and does not measure genetic abnormalities elsewhere in the genome. Other thrombotic risk factors may be pursued through systematic clinical laboratory analysis. These factors include the R506Q (Leiden) mutation in the Factor V gene, plasma homocysteine levels, as well as testing for deficiencies of antithrombin III, protein C and protein S. Genetic Counselors are available for health care providers to discuss results at [phone]-GENE [phone]). Methodology: DNA analysis of the Factor II gene was performed by PCR amplification followed by restriction analysis. The di                          agnostic sensitivity is >99% for both. All the tests must be combined with clinical information for the most accurate interpretation. Molecular-based testing is highly accurate, but as in any laboratory test, diagnostic errors may occur. This test was developed and its performance characteristics determined by [facility]. It has not been cleared or approved by the Food and Drug Administration. Poort SR, et al. Blood. 1996; 47:4259-5638. Varga EA. Circulation. 2004; 756:E33-I95. Mervin Hack, et [place]; 19:700-703. Allison Quarry, PhD, [facility] Ruben Reason, PhD, [facility] Ileene Hutchinson, PhD, [facility] Alfredo Bach, PhD, [facility]  Norva Riffle, PhD, [facility] Earlean Polka, PhD, [facility] Performed At: [facility] [address] [place], [place] [id number] Katina Degree MDPhD TK:[id number]   . Recommendations-F5LEID: 03/28/2020 Comment   Final   Comment: (NOTE) Result:  Negative (no mutation found) Factor V Leiden is a specific mutation (R506Q) in the factor V gene that is associated with an increased risk of venous thrombosis. Factor V Leiden is more resistant to inactivation by activated protein C.  As a result, factor V persists in the circulation leading to a mild hyper- coagulable state.  The Leiden mutation accounts for 90% - 95% of APC resistance.  Factor V Leiden has been reported in patients with deep vein thrombosis, pulmonary embolus, central retinal vein occlusion, cerebral sinus thrombosis and hepatic vein thrombosis. Other risk factors to be considered in the workup for venous thrombosis include the G20210A mutation in the factor II (prothrombin) gene, protein S and C deficiency, and antithrombin deficiencies. Anticardiolipin antibody and lupus anticoagulant analysis may be appropriate for certain patients, as well as homocysteine levels. Contact your local [facility] for information on how to order additi                          onal testing if desired. **Genetic counselors are available for health care providers to**  discuss results at [phone]). Methodology: DNA analysis of the Factor V gene was performed by allele-specific PCR. The diagnostic sensitivity and specificity is >99% for both. Molecular-based testing is highly accurate, but as in any laboratory test, diagnostic errors may occur. All test results must be combined with clinical information for the most accurate interpretation. This test was developed and its  performance characteristics determined by [facility]. It has not been cleared or approved by the Food and Drug Administration. References: Voelkerding K (1996).  Clin  Lab Med [phone]. Allison Quarry, PhD, [facility] Ruben Reason, PhD, [facility] Ileene Hutchinson, PhD, [facility] Alfredo Bach, PhD, [facility] Norva Riffle, PhD, [facility] Earlean Polka PhD, [facility] Performed At: [facility] [address] [place], [place] [id number] Katina Degree MDPhD FY:[id number]   . Folate 03/28/2020 15.8  >5.9 ng/mL Final   Performed at [facility], [address] [place], [place] [address]  . Vitamin B-12 03/28/2020 1,430* 180 - 914 pg/mL Final   Comment: (NOTE) This assay is not validated for testing neonatal or myeloproliferative syndrome specimens for Vitamin B12 levels. Performed at [facility], [place] [address] [place], [place] [address]   . Iron 03/28/2020 37  28 - 170 ug/dL Final  . TIBC 03/28/2020 358  250 - 450 ug/dL Final  . Saturation Ratios 03/28/2020 10* 10.4 - 31.8 % Final  . UIBC 03/28/2020 321  ug/dL Final   Performed at [facility], [address] [place], [place] [address]  . Ferritin 03/28/2020 160  11 - 307 ng/mL Final   Performed at [facility], [address] [place], [place] [address]  . Sodium 03/28/2020 144  135 - 145 mmol/L Final  . Potassium 03/28/2020 3.9  3.5 - 5.1 mmol/L Final  . Chloride 03/28/2020 107  98 - 111 mmol/L Final  . CO2 03/28/2020 26  22 - 32 mmol/L Final  . Glucose, Bld 03/28/2020 87  70 - 99 mg/dL Final   Glucose reference range applies only to samples taken after fasting for at least 8 hours.  . BUN 03/28/2020 23  8 - 23 mg/dL Final  . Creatinine, Ser 03/28/2020 1.33* 0.44 - 1.00 mg/dL Final  . Calcium 03/28/2020 9.1  8.9 - 10.3 mg/dL Final  . Total Protein 03/28/2020 7.3  6.5 - 8.1 g/dL Final  . Albumin 03/28/2020 3.6  3.5 - 5.0 g/dL Final  . AST 03/28/2020 20  15 - 41 U/L Final  . ALT 03/28/2020 13  0 - 44 U/L Final  . Alkaline Phosphatase 03/28/2020 74  38 - 126 U/L Final  . Total Bilirubin 03/28/2020 0.5  0.3 - 1.2 mg/dL Final  . GFR calc non Af Amer 03/28/2020 40* >60  mL/min Final  . GFR calc Af Amer 03/28/2020 46* >60 mL/min Final  . Anion gap 03/28/2020 11  5 - 15 Final   Performed at [facility], [address], [place], [place] [address]  . WBC 03/28/2020 5.1  4.0 - 10.5 K/uL Final  . RBC 03/28/2020 3.24* 3.87 - 5.11 MIL/uL Final  . Hemoglobin 03/28/2020 9.1* 12.0 - 15.0 g/dL Final  . HCT 03/28/2020 29.9* 36 - 46 % Final  . MCV 03/28/2020 92.3  80.0 - 100.0 fL Final  . MCH 03/28/2020 28.1  26.0 - 34.0 pg Final  . MCHC 03/28/2020 30.4  30.0 - 36.0 g/dL Final  . RDW 03/28/2020 16.7* 11.5 - 15.5 % Final  . Platelets 03/28/2020 205  150 - 400 K/uL Final  . nRBC 03/28/2020 0.0  0.0 - 0.2 % Final  . Neutrophils Relative % 03/28/2020 58  % Final  . Neutro Abs 03/28/2020 3.0  1.7 - 7.7 K/uL Final  . Lymphocytes Relative 03/28/2020 32  % Final  . Lymphs Abs 03/28/2020 1.6  0.7 - 4.0 K/uL Final  . Monocytes Relative 03/28/2020 6  %  Final  . Monocytes Absolute 03/28/2020 0.3  0 - 1 K/uL Final  . Eosinophils Relative 03/28/2020 3  % Final  . Eosinophils Absolute 03/28/2020 0.1  0 - 0 K/uL Final  . Basophils Relative 03/28/2020 1  % Final  . Basophils Absolute 03/28/2020 0.0  0 - 0 K/uL Final  . Immature Granulocytes 03/28/2020 0  % Final  . Abs Immature Granulocytes 03/28/2020 0.02  0.00 - 0.07 K/uL Final   Performed at [facility], [address], [place], [place] [address]  . dRVVT Mix 03/28/2020 49.7* 0.0 - 40.4 sec Final   Comment: (NOTE) Performed At: [facility] [place], [place] [id number] Rush Farmer MD BO:[id number]   . dRVVT Confirm 03/28/2020 1.0  0.8 - 1.2 ratio Final   Comment: (NOTE) Performed At: [facility] [place], [place] [id number] Rush Farmer MD IR:[id number]     Assessment:  Rachelanne Whidby is a 74 y.o. female a submassive pulmonary embolism and right popliteal DVT. She underwent pulmonary thrombectomy and catheter directed TPA on 03/10/2020.   She is on Eliquis.  Chest CT angiogram revealed an acute PE with right heart strain (RV/LV Ratio = 1.5) c/w at least submassive (intermediate risk) PE. Bilateral lower extremity venous duplex revealed a right popliteal DVT.   Work-up on 03/28/2020 revealed a hematocrit of 29.9, hemoglobin 9.1, MCV 92.3, platelets 205,000, WBC 5,100. Platelet count in a blue top was 194,000. Creatinine was 1.33 (CrCl 46 ml/min). Ferritin was 160 with an iron saturation of 10% and a TIBC of 358. Reticulocyte count was 4.0%. Vitamin B12 was 1,430 and folate was 15.8. Hepatitis B core antibody, hepatitis C antibody, and HIV antibody were negative. ANA was negative. Factor V Leiden, prothrombin gene mutation, lupus anticoagulant panel, and anti-cardiolipin antibodies were negative.  Beta2-glycoprotein is pending.  She has chronic thrombocytopenia. Platelet count has been followed: 145,000 on 02/26/2016, 169,000 on 06/02/2017, 132,000 on 09/07/2017, 135,000 on 09/23/2019, and 102,000 on 03/13/2020.  She has B12 deficiency and is on oral B12.  Abdomen and pelvis CT without contrast on 04/07/2020 revealed no pathologically enlarged lymph nodes.  Liver and spleen were normal. There was residual inflammatory ground-glass in the lung bases, related to pulmonary emboli seen on 03/11/2020. There was aortic atherosclerosis and coronary artery calcification.  She has renal insufficiency.  Creatinine was 1.94 (CrCl 32.8 ml/min) on 03/13/2020.  She has rheumatoid arthritis and has received Orencia infusions monthly for about 2 years.  Symptomatically, she is doing well.  She notes some bruising on Eliquis.  Plan: 1.   Pulmonary embolism  She has had a submassive pulmonary embolism.  Review plan for lifelong anticoagulation.  She has had a negative hypercoagulable work-up to date.  Review interval abdomen and pelvis CT scan.  Agree with radiology interpretation.   No evidence of malignancy.  Additional hypercoagulable work-up  in 3 months (around mid 05/2020).  Refill Eliquis. 2.   Thrombocytopenia, resolved  Platelet count was 205,000 on 03/28/2020.  ANA, hepatitis B core antibody, hepatitis C antibody, and HIV testing was negative.  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 3.   Normocytic anemia  Hematocrit 29.9.  Hemoglobin 9.1.  MCV 92.3 on 03/28/2020.  SPEP and UPEP in 01/2017 revealed no monoclonal protein (consider repeating).   Ferritin 116 with an iron saturation of 10% and a TIBC of 358.  B12 and folate were normal.  Bilirubin 0.5.  Retic was 4% indicating  an appropriate marrow response.  Etiology felt secondary to chronic renal insufficiency (Cr 1.94 on 03/13/2020). 4.   RTC in 2 weeks for labs (CBC with diff, ferritin, iron studies, sed rate, beta2 glycoprotein). 5.   RTC on 06/12/2020 for labs (CBC, ferritin, protein C antigen/activity, protein S antigen/activity, and AT III antigen/activity). 6.   RTC on 06/19/2020 for MD assessment and review of labs.  I discussed the assessment and treatment plan with the patient.  The patient was provided an opportunity to ask questions and all were answered.  The patient agreed with the plan and demonstrated an understanding of the instructions.  The patient was advised to call back if the symptoms worsen or if the condition fails to improve as anticipated.  I provided 18 minutes of face-to-face time during this this encounter and > 50% was spent counseling as documented under my assessment and plan.   Melissa C. Mike Gip, MD, PhD    04/10/2020, 3:33 PM  I, Mirian Mo Tufford, am acting as Education administrator for [employer]. Mike Gip, MD, PhD.  I, Melissa C. Mike Gip, MD, have reviewed the above documentation for accuracy and completeness, and I agree with the above.

## 2020-04-10 ENCOUNTER — Inpatient Hospital Stay (HOSPITAL_BASED_OUTPATIENT_CLINIC_OR_DEPARTMENT_OTHER): Payer: Medicare Other | Admitting: Hematology and Oncology

## 2020-04-10 ENCOUNTER — Other Ambulatory Visit: Payer: Self-pay

## 2020-04-10 ENCOUNTER — Telehealth: Payer: Self-pay | Admitting: Cardiovascular Disease

## 2020-04-10 ENCOUNTER — Encounter: Payer: Self-pay | Admitting: Hematology and Oncology

## 2020-04-10 VITALS — BP 157/74 | HR 78 | Temp 96.0°F | Resp 18 | Wt 244.9 lb

## 2020-04-10 DIAGNOSIS — D649 Anemia, unspecified: Secondary | ICD-10-CM | POA: Diagnosis not present

## 2020-04-10 DIAGNOSIS — I2699 Other pulmonary embolism without acute cor pulmonale: Secondary | ICD-10-CM | POA: Diagnosis not present

## 2020-04-10 DIAGNOSIS — I249 Acute ischemic heart disease, unspecified: Secondary | ICD-10-CM

## 2020-04-10 DIAGNOSIS — I2609 Other pulmonary embolism with acute cor pulmonale: Secondary | ICD-10-CM

## 2020-04-10 MED ORDER — APIXABAN 5 MG PO TABS: 5.0000 mg | ORAL_TABLET | Freq: Two times a day (BID) | ORAL | 0 refills | Status: DC

## 2020-04-10 NOTE — Progress Notes (Signed)
Patient here for oncology follow-up appointment, expresses complaints of shortness of breath and asks about eliquis refill.

## 2020-04-10 NOTE — Telephone Encounter (Signed)
Pt c/o medication issue:  1. Name of Medication: Eliquis   2. How are you currently taking this medication (dosage and times per day)? Patient not sure if advised last visit to keep taking or stop   3. Are you having a reaction (difficulty breathing--STAT)?  No   4. What is your medication issue? Patient is out and doesn't remember if she is supposed to continue based on last ov  If refilled send to [facility] but future refills should go through [facility]

## 2020-04-10 NOTE — Telephone Encounter (Signed)
Yes, she has been referred to hematology for this and is seeing them today. They have anticipated she will need this for the rest of her life. This can be managed by them or her PCP since it is for PE.

## 2020-04-11 NOTE — Telephone Encounter (Signed)
Attempted to call patient. LMTCB 04/11/2020   

## 2020-04-12 NOTE — Telephone Encounter (Signed)
Returned call to patient. She verbalized understanding and is in agreement with POC.

## 2020-04-18 ENCOUNTER — Ambulatory Visit
Admission: RE | Admit: 2020-04-18 | Discharge: 2020-04-18 | Disposition: A | Discharge status: Home / Self Care | Payer: Medicare Other | Source: Ambulatory Visit | Attending: Family Medicine | Admitting: Family Medicine

## 2020-04-18 ENCOUNTER — Other Ambulatory Visit: Payer: Self-pay

## 2020-04-18 DIAGNOSIS — Z1231 Encounter for screening mammogram for malignant neoplasm of breast: Secondary | ICD-10-CM

## 2020-04-24 ENCOUNTER — Inpatient Hospital Stay: Payer: Medicare Other

## 2020-04-24 ENCOUNTER — Telehealth: Payer: Self-pay | Admitting: Cardiovascular Disease

## 2020-04-24 ENCOUNTER — Other Ambulatory Visit: Payer: Self-pay

## 2020-04-24 DIAGNOSIS — I2699 Other pulmonary embolism without acute cor pulmonale: Secondary | ICD-10-CM | POA: Diagnosis not present

## 2020-04-24 DIAGNOSIS — D649 Anemia, unspecified: Secondary | ICD-10-CM

## 2020-04-24 DIAGNOSIS — I2609 Other pulmonary embolism with acute cor pulmonale: Secondary | ICD-10-CM

## 2020-04-24 LAB — CBC WITH DIFFERENTIAL/PLATELET
Abs Immature Granulocytes: 0.05 10*3/uL (ref 0.00–0.07)
Basophils Absolute: 0 10*3/uL (ref 0.0–0.1)
Basophils Relative: 0 %
Eosinophils Absolute: 0 10*3/uL (ref 0.0–0.5)
Eosinophils Relative: 0 %
HCT: 36.9 % (ref 36.0–46.0)
Hemoglobin: 11.5 g/dL — ABNORMAL LOW (ref 12.0–15.0)
Immature Granulocytes: 1 %
Lymphocytes Relative: 14 %
Lymphs Abs: 1.1 10*3/uL (ref 0.7–4.0)
MCH: 28.4 pg (ref 26.0–34.0)
MCHC: 31.2 g/dL (ref 30.0–36.0)
MCV: 91.1 fL (ref 80.0–100.0)
Monocytes Absolute: 0.3 10*3/uL (ref 0.1–1.0)
Monocytes Relative: 3 %
Neutro Abs: 6.7 10*3/uL (ref 1.7–7.7)
Neutrophils Relative %: 82 %
Platelets: 176 10*3/uL (ref 150–400)
RBC: 4.05 MIL/uL (ref 3.87–5.11)
RDW: 15.8 % — ABNORMAL HIGH (ref 11.5–15.5)
WBC: 8.2 10*3/uL (ref 4.0–10.5)
nRBC: 0 % (ref 0.0–0.2)

## 2020-04-24 LAB — IRON AND TIBC
Iron: 106 ug/dL (ref 28–170)
Saturation Ratios: 26 % (ref 10.4–31.8)
TIBC: 407 ug/dL (ref 250–450)
UIBC: 301 ug/dL

## 2020-04-24 LAB — FERRITIN: Ferritin: 60 ng/mL (ref 11–307)

## 2020-04-24 LAB — SEDIMENTATION RATE: Sed Rate: 37 mm/hr — ABNORMAL HIGH (ref 0–30)

## 2020-04-24 NOTE — Telephone Encounter (Signed)
Callback to check with GI service how urgent this colonoscopy is. Since patient just diagnosed with large bilateral PE in August of 2021, I think it would be high risk for him to hold Eliquis. Would recommend delay colonoscopy if the procedure is not urgent.   Will include Dr. Rockey Situ to see his input.

## 2020-04-24 NOTE — Telephone Encounter (Signed)
   [facility] Pre-operative Risk Assessment    HEARTCARE STAFF: - Please ensure there is not already an duplicate clearance open for this procedure. - Under Visit Info/Reason for Call, type in Other and utilize the format Clearance MM/DD/YY or Clearance TBD. Do not use dashes or single digits. - If request is for dental extraction, please clarify the # of teeth to be extracted.  Request for surgical clearance:  1. What type of surgery is being performed? COLONOSCOPY  2. When is this surgery scheduled? 05/02/20  3. What type of clearance is required (medical clearance vs. Pharmacy clearance to hold med vs. Both)? BOTH  4. Are there any medications that need to be held prior to surgery and how long? ELIQUIS  5. Practice name and name of physician performing surgery? [facility],   What is the office phone number?  [phone]   7.   What is the office fax number? [phone]  8.   Anesthesia type (None, local, MAC, general) ? NOT LISTED   Elissa Hefty 04/24/2020, 3:21 PM  _________________________________________________________________   (provider comments below)

## 2020-04-24 NOTE — Telephone Encounter (Signed)
I s/w the GI office who will be performing colonoscopy on pt 05/02/20. I called per our Pre Op Team who has asked to confirm if the colonoscopy is of an urgent matter. Explained 02/2020 the pt had large B/L PE and would be high risk for holding her blood thinner at this time. Nurse I s/w will have the nurse for MD performing the procedure call our office tomorrow. I advised for her to please have them ask for the Pre OP Team and they may have questions for the cardiologist.

## 2020-04-25 LAB — BETA-2-GLYCOPROTEIN I ABS, IGG/M/A
Beta-2 Glyco I IgG: <9 GPI IgG units (ref 0–20)
Beta-2-Glycoprotein I IgA: <9 GPI IgA units (ref 0–25)
Beta-2-Glycoprotein I IgM: <9 GPI IgM units (ref 0–32)

## 2020-04-25 NOTE — Telephone Encounter (Signed)
I spoke with the PA Kathi Der and she states that this is a follow up for colon polyps from 5 years ago.  She is wondering if there is something in the colon that did not show up on the CT. She also says that there is another non sedated procedure that she may be able to do if necessary. Kathi Der can be reached at [phone].

## 2020-04-25 NOTE — Telephone Encounter (Signed)
  Fu Message    PA [place] in from [facility], she was advised to call back and speak with the preop team   Kristy cell number is [phone]

## 2020-04-26 ENCOUNTER — Other Ambulatory Visit: Payer: Self-pay | Admitting: Cardiovascular Disease

## 2020-04-26 MED ORDER — APIXABAN 5 MG PO TABS
5.0000 mg | ORAL_TABLET | Freq: Two times a day (BID) | ORAL | 1 refills | Status: DC
Start: 2020-04-26 — End: 2020-11-23

## 2020-04-26 NOTE — Telephone Encounter (Addendum)
   Primary Cardiologist: Ida Rogue, MD  Chart reviewed as part of pre-operative protocol coverage. Patient was contacted 04/26/2020 in reference to pre-operative risk assessment for pending surgery as outlined below.  Sabrina Green was last seen on 03/27/20 by Christell Faith. 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 Cardiology evaluated patient for right heart strain at that time and patient was referred to primary care/hematology to manage her VTE event.    Upon speaking with the patient today, she reports she's doing OK but does report sporadic vague chest pain, not really able to give me specific details, states, "I just don't keep up with it much." No other symptoms reported. Symptoms are occasional, infrequent, not like prior PE, no medication noncompliance reported. She is feeling fine today. Based on new symptoms, she will require a follow-up visit with EKG in order to better assess preoperative cardiovascular risk.  Regarding her anticoagulation, however, she has since seen hematology, notes available in Epic. As we are not managing her PE, would recommend requesting GI provider involve hematologist/primary care for further recommendations on clearance given the degree of how large her clotting event was and how recent.  Pre-op covering staff: - Please schedule appointment and call patient to inform them.  Will route this bundled recommendation to requesting provider via Epic fax function. Please call with questions.  Charlie Pitter, PA-C  04/26/2020, 8:53 AM

## 2020-04-26 NOTE — Telephone Encounter (Signed)
Message sent to [facility] office to set up sooner appt for pre op as procedure is scheduled for 05/02/20.

## 2020-04-26 NOTE — Telephone Encounter (Signed)
Refill request

## 2020-04-26 NOTE — Telephone Encounter (Signed)
Pt's age 74, wt 111.1 kg, SCr 1.33, CrCl 68.05, last ov w/ TG 03/27/20. Refill sent in as requested.

## 2020-04-26 NOTE — Telephone Encounter (Signed)
Pt has appt 04/27/20 with Laurann Montana, PAC in the [place] office for pre op assessment.

## 2020-04-26 NOTE — Telephone Encounter (Signed)
°*  STAT* If patient is at the pharmacy, call can be transferred to refill team.   1. Which medications need to be refilled? (please list name of each medication and dose if known) eliquis 5 mg bid  2. Which pharmacy/location (including street and city if local pharmacy) is medication to be sent to? [facility]  3. Do they need a 30 day or 90 day supply? [place]

## 2020-04-27 ENCOUNTER — Other Ambulatory Visit: Payer: Self-pay

## 2020-04-27 ENCOUNTER — Ambulatory Visit (INDEPENDENT_AMBULATORY_CARE_PROVIDER_SITE_OTHER): Payer: Medicare Other | Admitting: Family

## 2020-04-27 ENCOUNTER — Encounter: Payer: Self-pay | Admitting: Family

## 2020-04-27 VITALS — BP 132/62 | HR 68 | Ht 65.0 in | Wt 242.0 lb

## 2020-04-27 DIAGNOSIS — Z01810 Encounter for preprocedural cardiovascular examination: Secondary | ICD-10-CM | POA: Diagnosis not present

## 2020-04-27 DIAGNOSIS — I249 Acute ischemic heart disease, unspecified: Secondary | ICD-10-CM

## 2020-04-27 DIAGNOSIS — E785 Hyperlipidemia, unspecified: Secondary | ICD-10-CM

## 2020-04-27 DIAGNOSIS — Z7901 Long term (current) use of anticoagulants: Secondary | ICD-10-CM | POA: Diagnosis not present

## 2020-04-27 DIAGNOSIS — I2699 Other pulmonary embolism without acute cor pulmonale: Secondary | ICD-10-CM | POA: Diagnosis not present

## 2020-04-27 DIAGNOSIS — I10 Essential (primary) hypertension: Secondary | ICD-10-CM

## 2020-04-27 DIAGNOSIS — N183 Chronic kidney disease, stage 3 unspecified: Secondary | ICD-10-CM

## 2020-04-27 MED ORDER — ATORVASTATIN CALCIUM 40 MG PO TABS: 40.0000 mg | ORAL_TABLET | Freq: Every day | ORAL | 3 refills | Status: AC

## 2020-04-27 MED ORDER — LABETALOL HCL 100 MG PO TABS: 100.0000 mg | ORAL_TABLET | Freq: Two times a day (BID) | ORAL | 3 refills | Status: AC

## 2020-04-27 NOTE — Patient Instructions (Signed)
Medication Instructions:  No medication changes today.   *If you need a refill on your cardiac medications before your next appointment, please call your pharmacy*   Lab Work: No lab work today.   Testing/Procedures: None ordered today.   Follow-Up: At [facility], you and your health needs are our priority.  As part of our continuing mission to provide you with exceptional heart care, we have created designated Provider Care Teams.  These Care Teams include your primary Cardiologist (physician) and Advanced Practice Providers (APPs -  Physician Assistants and Nurse Practitioners) who all work together to provide you with the care you need, when you need it.  We recommend signing up for the patient portal called "MyChart".  Sign up information is provided on this After Visit Summary.  MyChart is used to connect with patients for Virtual Visits (Telemedicine).  Patients are able to view lab/test results, encounter notes, upcoming appointments, etc.  Non-urgent messages can be sent to your provider as well.   To learn more about what you can do with MyChart, go to [email or web address].    Your next appointment:   3 month(s)  The format for your next appointment:   In Person  Provider:   You may see Ida Rogue, MD or one of the following Advanced Practice Providers on your designated Care Team:   Murray Hodgkins, NP  Christell Faith, PA-C  Marrianne Mood, PA-C  Laurann Montana, NP  Cadence Kathlen Mody, [place]  Other Instructions  Dr. Mike Gip will have to be the one to provide clearance to stop your Eliquis. We typically don't recommend stopping your Eliquis for at least 6 months after your diagnosis of a blood clot. We will route a note to Dr. Drinda Butts office to make them aware of this.   Your chest discomfort when laying down is likely from a PVC or premature ventricular contraction. This is an early beat in the bottom chambers of your heart that are not dangerous.

## 2020-04-27 NOTE — Progress Notes (Signed)
Office Visit    Patient Name: Sabrina Green Date of Encounter: 04/27/2020  Primary Care Provider:  Sofie Hartigan, MD Primary Cardiologist:  Ida Rogue, MD Electrophysiologist:  None   Chief Complaint    Sabrina Green is a 74 y.o. female with a hx of PE/DVT with right heart strain s/p pulmonary thrombectomy and TPA 02/2020, CKDIII, RA, anemia of chronic disease, fibromyalgia, HTN, HLD, DDD, chronic back pain, OA, obesity presents today for colonoscopy.  Past Medical History    Past Medical History:  Diagnosis Date  . Anemia    vitamin b and d deficiency  . Anxiety   . Arthritis    osteoarthrits  . Chronic pain syndrome   . CKD (chronic kidney disease) stage 3, GFR 30-59 ml/min   . Complication of anesthesia    difficulty waking up  . DDD (degenerative disc disease), cervical   . Fibromyalgia    not accurate, per patient  . GERD (gastroesophageal reflux disease)   . Gout   . Hearing loss   . History of kidney stones 1987  . Hypertension   . Rheumatoid arthritis (HCC)    orencia infusions  . Vitamin B12 deficiency   . Vitamin D deficiency    Past Surgical History:  Procedure Laterality Date  . ABDOMINAL HYSTERECTOMY    . ABDOMINAL HYSTERECTOMY    . [place], [place].   METAL IN ANKLE  . BREAST BIOPSY Right 02/09/2019   affirm bx of calcs, ribbon marker neg  . CESAREAN SECTION    . CHOLECYSTECTOMY    . EYE SURGERY Bilateral 2015   cataract surgery, [place], [place]  . KNEE ARTHROSCOPY    . PULMONARY THROMBECTOMY N/A 03/10/2020   Procedure: PULMONARY THROMBECTOMY;  Surgeon: Algernon Huxley, MD;  Location: [place] [facility];  Service: Cardiovascular;  Laterality: N/A;  . TONSILLECTOMY    . TOTAL KNEE ARTHROPLASTY Left 09/21/2019   Procedure: TOTAL KNEE ARTHROPLASTY;  Surgeon: Corky Mull, MD;  Location: [facility];  Service: Orthopedics;  Laterality: Left;  . TUBAL LIGATION       Allergies  Allergies  Allergen Reactions  . Penicillins Hives    History of Present Illness    Sabrina Green is a 74 y.o. female with a hx of PE/DVT with right heart strain s/p pulmonary thrombectomy and TPA 02/2020, CKDIII, RA, anemia of chronic disease, fibromyalgia, HTN, HLD, DDD, chronic back pain, OA, obesity last seen 03/27/20.  Ad[facility] to [facility]-03[facility]13/20 with dizziness, generalized malaise, fatigue, dyspnea. HS-troponin peaked at 714. EKG with NSR and inferior and anterolateral TWI. Echo with EF 55-60%, no RWMA, gr1DD, mildly reduced RVSF with severely enlraged RV cavity size and severely elevated PASP estimated at 64.17mmHg, moderately dilated RA, mild TR. D-dimer >7,500. Chest CTA with bilateral large PE involving central pulmonary arteries extending into lobar branches. Bilateral LE U/S with R popliteal DVT. Underwent thrombolysis and pulmonary thrombectomy by vascular surgery. Non contrast CT consistent with pulmonary hemorrhage. Noted she recently had left TKA in 08/2019 which was felt to be possibly contributory to DVT/PE. She was discharged on supplemental oxygen recommended to f/u with PCP, hematology, cardiology.   Seen in follow up 03/27/20. Noted only mild bilateral LE edema and does not always elevate legs when sitting. She had some mild epistaxis and recommended for saline nasal spray.  Seen by hematology 04/10/20. Recommended for lifelong anticoagulation and additional hypercoagulable workup in 3 months.  Received request for clearance for colonoscopy 04/24/20.  Observe on exam from colon polyps 5 years prior.  She had an abdominal CT which was unremarkable.  Regarding anticoagulation, it was determined that hematology (Dr. Mike Gip) or primary care will need to provide this clearance given degree of how large her clotting event was in her recent.  However she noted nonspecific chest pain she is scheduled for office visit.  Telephone encounter yesterday GI has decided  to hold off on colonoscopy at this point as her hemoglobin is stable, her CT was unremarkable, hemoglobin stable, heme-negative, no significant GI complaints.  Tells me she gets a chest "tightness, pressure" when sitting still and when laying in bed. Tells me she does deep breathing which helps resolve it. Tells me it feels like a "pressure" as if she needs to take a deep breath. Tells me this has been going on "for awhile" but is no different than previous.  No exertional symptoms nor dyspnea.  She is trying to be more aware of her cardiac status since her recent hospitalization.  She reports intermittent lower extremity edema that is worse when she is sitting and better when she elevates her legs.  She is using her oxygen periodically at home. She checks her pulse oximetry at home with readings >90% off her oxygen.   She is planning to start walking for exercise.   EKGs/Labs/Other Studies Reviewed:   The following studies were reviewed today: 2D echo 03/09/2020: 1. Left ventricular ejection fraction, by estimation, is 55 to 60%. The  left ventricle has normal function. The left ventricle has no regional  wall motion abnormalities. Left ventricular diastolic parameters are  consistent with Grade I diastolic  dysfunction (impaired relaxation).   2. Right ventricular systolic function is mildly reduced. The right  ventricular size is severely enlarged. There is severely elevated  pulmonary artery systolic pressure. The estimated right ventricular  systolic pressure is 89.3 mmHg.   3. Right atrial size was moderately dilated.  EKG:  EKG is ordered today.  The ekg ordered today demonstrates NSR 66 bpm with occasional PVC and no acute ST/T wave changes.  Recent Labs: 03/13/2020: Magnesium 1.6 03/28/2020: ALT 13; BUN 23; Creatinine, Ser 1.33; Potassium 3.9; Sodium 144 04/24/2020: Hemoglobin 11.5; Platelets 176  Recent Lipid Panel    Component Value Date/Time   CHOL 183 03/09/2020 0557   TRIG  80 03/09/2020 0557   HDL 46 03/09/2020 0557   CHOLHDL 4.0 03/09/2020 0557   VLDL 16 03/09/2020 0557   LDLCALC 121 (H) 03/09/2020 0557    Home Medications   Current Meds  Medication Sig  . allopurinol (ZYLOPRIM) 300 MG tablet Take 300 mg by mouth daily. In the morning  . apixaban (ELIQUIS) 5 MG TABS tablet Take 1 tablet (5 mg total) by mouth 2 (two) times daily.  [place] Kitchen ascorbic acid (VITAMIN C) 500 MG tablet Take 1 tablet by mouth daily.  [place] Kitchen atorvastatin (LIPITOR) 40 MG tablet Take 1 tablet (40 mg total) by mouth daily.  . Biotin 5000 MCG TABS Take 5,000 mcg by mouth daily.  . Cyanocobalamin (VITAMIN B-12) 5000 MCG TBDP Take 5,000 mcg by mouth daily.  . ferrous sulfate 325 (65 FE) MG tablet Take 1 tablet by mouth daily.  . fluticasone (FLONASE) 50 MCG/ACT nasal spray Place 2 sprays into both nostrils daily.  . folic acid (FOLVITE) 810 MCG tablet Take 400 mcg by mouth daily.  . hydrocortisone 2.5 % cream Apply topically.  Mar[place] Kitchen labetalol (NORMODYNE) 100 MG tablet  Take 1 tablet (100 mg total) by mouth 2 (two) times daily.  Mar[place] Kitchen lubiprostone (AMITIZA) 24 MCG capsule Take 24 mcg by mouth 2 (two) times daily as needed for constipation.   . magnesium oxide (MAG-OX) 400 MG tablet Take 1 tablet by mouth daily.  . methotrexate 50 MG/2ML injection Inject 12.5 mg into the vein every Thursday. (0.5 ml)  . Na Sulfate-K Sulfate-Mg Sulf 17.5-3.13-1.6 GM/177ML SOLN Take by mouth.  [place] Kitchen omeprazole (PRILOSEC) 20 MG capsule Take 20 mg by mouth daily before breakfast.   . tiZANidine (ZANAFLEX) 4 MG capsule Take 4 mg by mouth 2 (two) times daily as needed for muscle spasms.  . traMADol (ULTRAM) 50 MG tablet Take 1 tablet (50 mg total) by mouth every 6 (six) hours as needed for moderate pain.    Review of Systems   Review of Systems  Constitutional: Negative for chills, fever and malaise/fatigue.  Cardiovascular: Positive for chest pain, dyspnea on exertion and leg swelling. Negative for irregular heartbeat,  near-syncope, orthopnea, palpitations and syncope.  Respiratory: Negative for cough, shortness of breath and wheezing.   Gastrointestinal: Negative for melena, nausea and vomiting.  Genitourinary: Negative for hematuria.  Neurological: Negative for dizziness, light-headedness and weakness.   All other systems reviewed and are otherwise negative except as noted above.  Physical Exam    VS:  BP 132/62 (BP Location: Left Arm, Patient Position: Sitting, Cuff Size: Large)   Pulse 68   Ht 5\' 5"  (1.651 m)   Wt 242 lb (109.8 kg)   SpO2 95%   BMI 40.27 kg/m  , BMI Body mass index is 40.27 kg/m. GEN: Well nourished, overweight, well developed, in no acute distress. HEENT: normal. Neck: Supple, no JVD, carotid bruits, or masses. Cardiac: RRR, no murmurs, rubs, or gallops. No clubbing, cyanosis.  Trace pedal edema bilaterally.  Radials/DP/PT 2+ and equal bilaterally.  Respiratory:  Respirations regular and unlabored, clear to auscultation bilaterally. GI: Soft, nontender, nondistended, BS + x 4. MS: No deformity or atrophy. Skin: Warm and dry, no rash. Neuro:  Strength and sensation are intact. Psych: Normal affect.  Assessment & Plan    1. Preoperative cardiovascular examination - Requested clearance for routine colonoscopy. According to the Revised Cardiac Risk Index (RCRI), her Perioperative Risk of Major Cardiac Event is (%): 0.9. Her Functional Capacity in METs is: 5.38 according to the Duke Activity Status Index (DASI).  Per AHA/ACC guidelines she is acceptable risk for the planned procedure without additional cardiovascular intervention.  However, in the setting of recent significant PE 02/2020 would not recommend discontinuation of her Eliquis at this time.  Further recommendations for timing of procedure per hematology (Dr. Mike Gip) or PCP.  She was made aware of this recommendation in clinic and was agreeable.  Her GI team is also aware and has called her to discuss rescheduling the  procedure.  Will route this message to them via epic fax function.  2. Atypical chest pain -reports intermittent chest discomfort predominantly when laying down.  Likely etiology PVC.  EKG today with PVC and no acute ST/T wave changes.  No indication for ischemic evaluation.  3. PVC -noted on EKG today with reported sensation of intermittent chest discomfort when laying down.  Likely etiology PVC.  EKG with no acute ST/T wave changes.  Continue present labetalol dose.  Discussed the importance of staying well-hydrated, avoiding caffeine, and also that stress can be contributory.  4. Bilateral PE s/p thrombolysis/right popliteal DVT -possibly in setting of left TKA in February  2021.  Indefinite anticoagulation per hematology.  Denies bleeding complications.  Recent hemoglobin stable.  5. Coronary artery calcification -stable no anginal symptoms.  She reports intermittent sensation of chest discomfort which is very consistent with description of PVC noted on EKG today.  No indication for ischemic evaluation.  Continue statin, beta-blocker.  No aspirin secondary to chronic anticoagulation.  6. Chronic hypoxic respiratory failure -in setting of PE.  Has been able to wean down off her oxygen without hypoxia.  Continue to follow with PCP.  7. CKDIII -careful titration of diuretics and hypertensives.  Avoid nephrotoxic agents.  Continue to follow with nephrology.  8. HTN - BP well controlled. Continue current antihypertensive regimen.  We will send labetalol refill.  9. HLD -LDL goal less than 70 in the setting of coronary artery calcification.  Continue Lipitor 40 mg daily.  Will send refill.  Cholesterol panel 03/09/2020 total cholesterol 183, LDL 121, HDL 46, triglycerides 80.  Plan to recheck lipid panel at follow-up.  10. Hypomagnesia -continue p.o. supplementation.  11. Anemia of chronic disease -most recent hemoglobin stable.  Tolerating oral iron well.  Disposition: Follow up in 3 month(s) with  Dr. Rockey Situ or APP   Loel Dubonnet, NP 04/27/2020, 4:05 PM

## 2020-04-28 ENCOUNTER — Other Ambulatory Visit: Admission: RE | Admit: 2020-04-28 | Payer: Medicare Other | Source: Ambulatory Visit

## 2020-05-02 ENCOUNTER — Encounter: Admission: RE | Payer: Self-pay | Source: Ambulatory Visit

## 2020-05-02 ENCOUNTER — Ambulatory Visit: Admission: RE | Admit: 2020-05-02 | Payer: Medicare Other | Source: Ambulatory Visit

## 2020-05-02 SURGERY — COLONOSCOPY WITH PROPOFOL
Anesthesia: General

## 2020-05-10 NOTE — Progress Notes (Addendum)
PROVIDER NOTE: Information contained herein reflects review and annotations entered in association with encounter. Interpretation of such information and data should be left to medically-trained personnel. Information provided to patient can be located elsewhere in the medical record under "Patient Instructions". Document created using STT-dictation technology, any transcriptional errors that may result from process are unintentional.    Patient: Sabrina Green  Service Category: E/M  Provider: Gaspar Cola, MD  DOB: 24-Nov-1945  DOS: 05/11/2020  Specialty: Interventional Pain Management  MRN: [id number]  Setting: Ambulatory outpatient  PCP: Sofie Hartigan, MD  Type: Established Patient    Referring Provider: Sofie Hartigan, MD  Location: Office  Delivery: Face-to-face     HPI  [name], a 74 y.o. year old female, is here today because of her Chronic pain syndrome [G89.4]. Ms. Guynes primary complain today is Knee Pain (right) Last encounter: My last encounter with her was on Visit date not found. Pertinent problems: Ms. Tullis has Shoulder bursitis (Right); Chronic gout; Chronic low back pain (Midline); DDD (degenerative disc disease), lumbar; Fibromyalgia; Rheumatoid arthritis involving multiple sites [facility]); Chronic pain syndrome; Chronic knee pain (Right); Chronic lower extremity pain (Tertiary Area of Pain) (Left); Chronic shoulder pain (Fourth Area of Pain) (Right); Chronic hip pain (Left); Chronic shoulder pain (Bilateral); Osteoarthritis of shoulder (Bilateral); Chronic low back pain (Secondary Area of Pain) (Bilateral) (L>R); Lumbar facet syndrome (Bilateral) (L>R); Spondylosis without myelopathy or radiculopathy, lumbosacral region; Neurogenic pain; Carpal tunnel syndrome on right; Carpal tunnel syndrome, left; CMC arthritis; Bursitis of left shoulder; Osteoarthritis involving multiple joints; History of total knee replacement (Left); Tricompartment osteoarthritis of  knee (Right); and Osteoarthritis of knee (Right) on their pertinent problem list. Pain Assessment: Severity of Chronic pain is reported as a 1 /10. Location: Knee Right/does not radiate. Onset: More than a month ago. Quality:  (rubbing). Timing: Intermittent. Modifying factor(s): sitting sometimes walking. Vitals:  height is 5' 5" (1.651 m) and weight is 240 lb (108.9 kg). Her skin temperature is 96.8 F (36 C) (abnormal). Her blood pressure is 157/60 (abnormal) and her pulse is 80. Her respiration is 16 and oxygen saturation is 100%.   Reason for encounter: worsening of previously known (established) problem.  The patient called in requesting an appointment due to worsening of the bilateral knee pain.  We last saw this patient on 09/09/2019, at which time we had done a bilateral intra-articular Hyalgan knee injection #1 and we had given the patient instructions to return in 2 weeks for the second in a series of 5 injections.  However, she did not keep that appointment and she also did not follow-up after that first Hyalgan knee injection.  The patient returns today indicating that she had a total knee replacement on the left side by Dr. Roland Rack.  She is currently having no problems with the left knee.  Today she comes in indicating that she still have some pain on the right knee and she would like to go ahead and complete the series of intra-articular Hyalgan injections since they did help, but she would like to see if we can get a better.  Unfortunately, since the last time that we saw her she had a deep venous thrombosis and pulmonary embolism for which he was placed on Eliquis.  She has been on Eliquis for a short period of time and therefore her physician has declined clearance to stop it.  We will go ahead with the knee injections as soon as she is able to  stop that Eliquis for at least 3 days prior to the procedure.  Post-Procedure Evaluation  Procedure (09/09/2019): Therapeutic bilateral  intra-articular Hyalgan knee injection #1, no fluoroscopy or IV sedation Pre-procedure pain level: 1/10 Post-procedure: 1/10 No initial benefit, possibly due to rapid discharge after no sedation procedure, without enough time to allow full onset of block.  Sedation: None.  Effectiveness during initial hour after procedure(Ultra-Short Term Relief): 100 %.  Local anesthetic used: Long-acting (4-6 hours) Effectiveness: Defined as any analgesic benefit obtained secondary to the administration of local anesthetics. This carries significant diagnostic value as to the etiological location, or anatomical origin, of the pain. Duration of benefit is expected to coincide with the duration of the local anesthetic used.  Effectiveness during initial 4-6 hours after procedure(Short-Term Relief): 100 %.  Long-term benefit: Defined as any relief past the pharmacologic duration of the local anesthetics.  Effectiveness past the initial 6 hours after procedure(Long-Term Relief): 75 % (ongoing on left knee).  Current benefits: Defined as benefit that persist at this time.   Analgesia:  >50% relief Function: Ms. Kats reports improvement in function ROM: Ms. Parslow reports improvement in ROM  Pharmacotherapy Assessment   Analgesic: No opioid analgesics prescribed by our practice    Monitoring: [place] PMP: PDMP reviewed during this encounter.       Pharmacotherapy: No side-effects or adverse reactions reported. Compliance: No problems identified. Effectiveness: Clinically acceptable.  No notes on file  UDS:  Summary  Date Value Ref Range Status  12/11/2017 FINAL  Final    Comment:    ==================================================================== TOXAS[facility]RE COMP DRUG ANALYSIS,UR ==================================================================== Test                             Result       Flag       Units Drug Present and Declared for Prescription Verification   Gabapentin                      PRESENT      EXPECTED   Propranolol                    PRESENT      EXPECTED Drug Present not Declared for Prescription Verification   Acetaminophen                  PRESENT      UNEXPECTED   Diphenhydramine                PRESENT      UNEXPECTED Drug Absent but Declared for Prescription Verification   Tizanidine                     Not Detected UNEXPECTED    Tizanidine, as indicated in the declared medication list, is not    always detected even when used as directed.   Sertraline                     Not Detected UNEXPECTED   Promethazine                   Not Detected UNEXPECTED ==================================================================== Test                      Result    Flag   Units      Ref Range   Creatinine  117              mg/dL      >=20 ==================================================================== Declared Medications:  The flagging and interpretation on this report are based on the  following declared medications.  Unexpected results may arise from  inaccuracies in the declared medications.  **Note: The testing scope of this panel includes these medications:  Gabapentin (Neurontin)  Promethazine (Phenergan)  Propranolol (Inderal)  Sertraline (Zoloft)  **Note: The testing scope of this panel does not include small to  moderate amounts of these reported medications:  Tizanidine (Zanaflex)  **Note: The testing scope of this panel does not include following  reported medications:  Allopurinol  Calcium (Calcium/Vitamin D)  Doxycycline (Vibramycin)  Estradiol (Estrace)  Fluticasone (Flonase)  Furosemide (Lasix)  Losartan (Cozaar)  Lubiprostone (Amitiza)  Methotrexate (Rheumatrex)  Mupirocin (Bactroban)  Omeprazole (Prilosec)  Vitamin D  Vitamin D (Calcium/Vitamin D) ==================================================================== For clinical consultation, please call [phone]. ====================================================================      ROS  Constitutional: Denies any fever or chills Gastrointestinal: No reported hemesis, hematochezia, vomiting, or acute GI distress Musculoskeletal: Denies any acute onset joint swelling, redness, loss of ROM, or weakness Neurological: No reported episodes of acute onset apraxia, aphasia, dysarthria, agnosia, amnesia, paralysis, loss of coordination, or loss of consciousness  Medication Review  Biotin, Vitamin B-12, allopurinol, apixaban, ascorbic acid, atorvastatin, ferrous sulfate, fluticasone, folic acid, gabapentin, labetalol, lubiprostone, magnesium oxide, methotrexate, omeprazole, predniSONE, tiZANidine, and traMADol  History Review  Allergy: Ms. Mchaney is allergic to penicillins. Drug: Ms. Lizama  reports no history of drug use. Alcohol:  reports no history of alcohol use. Tobacco:  reports that she has never smoked. She has never used smokeless tobacco. Social: Ms. Guardiola  reports that she has never smoked. She has never used smokeless tobacco. She reports that she does not drink alcohol and does not use drugs. Medical:  has a past medical history of Anemia, Anxiety, Arthritis, Chronic pain syndrome, CKD (chronic kidney disease) stage 3, GFR 30-59 ml/min (HCC), Complication of anesthesia, DDD (degenerative disc disease), cervical, Fibromyalgia, GERD (gastroesophageal reflux disease), Gout, Hearing loss, History of kidney stones (1987), Hypertension, Rheumatoid arthritis ([place]), Vitamin B12 deficiency, and Vitamin D deficiency. Surgical: Ms. Tolles  has a past surgical history that includes Tonsillectomy; Cesarean section; Tubal ligation; Abdominal hysterectomy; Knee arthroscopy; Cholecystectomy; Abdominal hysterectomy; Ankle fracture surgery (Right, 1989); Eye surgery (Bilateral, 2015); Total knee arthroplasty (Left, 09/21/2019); PULMONARY THROMBECTOMY (N/A, 03/10/2020); and Breast biopsy (Right,  02/09/2019). Family: family history includes Breast cancer (age of onset: 31) in an other family member; Hypertension in her mother; Lung cancer in her sister; Migraines in her mother; Other in her father.  Laboratory Chemistry Profile   Renal Lab Results  Component Value Date   BUN 23 03/28/2020   CREATININE 1.33 (H) 03/28/2020   BCR 16 12/11/2017   GFRAA 46 (L) 03/28/2020   GFRNONAA 40 (L) 03/28/2020     Hepatic Lab Results  Component Value Date   AST 20 03/28/2020   ALT 13 03/28/2020   ALBUMIN 3.6 03/28/2020   ALKPHOS 74 03/28/2020   HCVAB NON REACTIVE 03/28/2020     Electrolytes Lab Results  Component Value Date   NA 144 03/28/2020   K 3.9 03/28/2020   CL 107 03/28/2020   CALCIUM 9.1 03/28/2020   MG 1.6 (L) 03/13/2020     Bone Lab Results  Component Value Date   25OHVITD1 46 12/11/2017   25OHVITD2 <1.0 12/11/2017   25OHVITD3 46 12/11/2017  Inflammation (CRP: Acute Phase) (ESR: Chronic Phase) Lab Results  Component Value Date   CRP 19.5 (H) 12/11/2017   ESRSEDRATE 37 (H) 04/24/2020       Note: Above Lab results reviewed.  Recent Imaging Review  MM 3D SCREEN BREAST BILATERAL CLINICAL DATA:  Screening.  EXAM: DIGITAL SCREENING BILATERAL MAMMOGRAM WITH TOMO AND CAD  COMPARISON:  Previous exam(s).  ACR Breast Density Category b: There are scattered areas of fibroglandular density.  FINDINGS: There are no findings suspicious for malignancy. Images were processed with CAD.  IMPRESSION: No mammographic evidence of malignancy. A result letter of this screening mammogram will be mailed directly to the patient.  RECOMMENDATION: Screening mammogram in one year. (Code:[id number]-B-01Y)  BI-RADS CATEGORY  1: Negative.  Electronically Signed   By: Margarette Canada M.D.   On: 04/18/2020 13:18 Note: Reviewed        Physical Exam  General appearance: Well nourished, well developed, and well hydrated. In no apparent acute distress Mental status: Alert,  oriented x 3 (person, place, & time)       Respiratory: No evidence of acute respiratory distress Eyes: PERLA Vitals: BP (!) 157/60 (BP Location: Right Arm, Patient Position: Sitting, Cuff Size: Large)   Pulse 80   Temp (!) 96.8 F (36 C) (Skin)   Resp 16   Ht 5' 5" (1.651 m)   Wt 240 lb (108.9 kg)   SpO2 100%   BMI 39.94 kg/m  BMI: Estimated body mass index is 39.94 kg/m as calculated from the following:   Height as of this encounter: 5' 5" (1.651 m).   Weight as of this encounter: 240 lb (108.9 kg). Ideal: Ideal body weight: 57 kg (125 lb 10.6 oz) Adjusted ideal body weight: 77.7 kg (171 lb 6.4 oz)  Assessment   Status Diagnosis  Persistent Worsening Worsening 1. Chronic pain syndrome   2. Chronic knee pain (Right)   3. Osteoarthritis of knee (Right)   4. Tricompartment osteoarthritis of knee (Right)   5. History of total knee replacement (Left)   6. Chronic anticoagulation (Eliquis)   7. History of deep vein thrombosis (DVT) of lower extremity   8. History of pulmonary embolism      Updated Problems: Problem  Tricompartment osteoarthritis of knee (Right)  Osteoarthritis of knee (Right)  History of total knee replacement (Left)   Replacement by Dr. 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 Abstracted from prior records from [place] provider   Normocytic Anemia  Osteoarthritis of knee (Left) (Resolved)  Nstemi  (Non-St Elevated Myocardial Infarction) (Hcc) (Resolved)    Plan of Care  Problem-specific:  No problem-specific Assessment & Plan notes found for this encounter.  Ms. Yazaira Speas has a current medication list which includes the following long-term medication(s): allopurinol, apixaban, atorvastatin, ferrous sulfate, fluticasone, labetalol, omeprazole, and gabapentin.  Pharmacotherapy (Medications Ordered): No orders of the defined types were placed in this encounter.  Orders:  Orders Placed This Encounter  Procedures  .  KNEE INJECTION    Hyalgan knee injection. Please order Hyalgan.    Standing Status:   Future    Standing Expiration Date:   06/11/2020    Scheduling Instructions:     Procedure: Intra-articular Hyalgan Knee injection #3 (fluoroscopy needed)     Side: Right     Sedation: None     Timeframe: As soon as approved    Order Specific Question:   Where will this procedure be performed?    Answer:   [facility]  . Nursing Instructions:    Please complete this patient's postprocedure evaluation.    Scheduling Instructions:     Please complete this patient's postprocedure evaluation.  . Blood Thinner Instructions to Nursing    Always make sure patient has clearance from prescribing physician to stop blood thinners for interventional therapies. If the patient requires a Lovenox-bridge therapy, make sure arrangements are made to institute it with the assistance of the PCP.    Scheduling Instructions:     Have Ms. Yilmaz stop the Eliquis (Apixaban) x 3 days prior to procedure or surgery.  . Blood Thinner Instructions to Nursing    If unable to stop, ask if Lovenox-bridge therapy may be possible, and if so, request their assistance in implementing it.    Scheduling Instructions:     Contact the physician prescribing the blood thinner and request clearance to stop it for time period stipulated below.     If approved by prescribing physician, stop Eliquis (Apixaban)  x 3 days prior to procedure or surgery.   Follow-up plan:   Return for Procedure (no sedation): (R) Hyalgan inj., (Blood Thinner Protocol).      Interventional management options: Planned, scheduled, and/or pending:   NOTE: Eliquis anticoagulation due to DVT and PE.  NO RFA until BMI <35.    Considering:   Diagnosticbilateral genicular nerve block #1 Possible bilateral genicular nerve RFA Diagnosticleft side LESI Diagnostic bilateral lumbar facet RFA Diagnosticright suprascapular NB Possible right suprascapular RFA   Palliative PRN treatment(s):   Therapeutic/palliative bilateral IA knee injection #3(w/ steroid) Diagnosticbilateral lumbar facet nerve block #2 Therapeutic bilateral Hyalgan knee inj. #B1Y6 (last done 09/09/2019) (fluoroscopy required) Diagnostic bilateral glenohumeral Joint (shoulder), AC joint, & subdeltoid bursaInjection#2    Recent Visits No visits were found meeting these conditions. Showing recent visits within past 90 days and meeting all other requirements Today's Visits Date Type Provider Dept  05/11/20 Office Visit Milinda Pointer, MD [facility]  Showing today's visits and meeting all other requirements Future Appointments No visits were found meeting these conditions. Showing future appointments within next 90 days and meeting all other requirements  I discussed the assessment and treatment plan with the patient. The patient was provided an opportunity to ask questions and all were answered. The patient agreed with the plan and demonstrated an understanding of the instructions.  Patient advised to call back or seek an in-person evaluation if the symptoms or condition worsens.  Duration of encounter: 30 minutes.  Note by: Gaspar Cola, MD Date: 05/11/2020; Time: 1:26 PM

## 2020-05-11 ENCOUNTER — Ambulatory Visit: Payer: Medicare Other | Attending: Pain Medicine | Admitting: Pain Medicine

## 2020-05-11 ENCOUNTER — Other Ambulatory Visit: Payer: Self-pay

## 2020-05-11 ENCOUNTER — Encounter: Payer: Self-pay | Admitting: Pain Medicine

## 2020-05-11 VITALS — BP 157/60 | HR 80 | Temp 96.8°F | Resp 16 | Ht 65.0 in | Wt 240.0 lb

## 2020-05-11 DIAGNOSIS — G894 Chronic pain syndrome: Secondary | ICD-10-CM | POA: Diagnosis not present

## 2020-05-11 DIAGNOSIS — Z86711 Personal history of pulmonary embolism: Secondary | ICD-10-CM | POA: Insufficient documentation

## 2020-05-11 DIAGNOSIS — G8929 Other chronic pain: Secondary | ICD-10-CM

## 2020-05-11 DIAGNOSIS — M1711 Unilateral primary osteoarthritis, right knee: Secondary | ICD-10-CM | POA: Diagnosis not present

## 2020-05-11 DIAGNOSIS — M25561 Pain in right knee: Secondary | ICD-10-CM | POA: Diagnosis present

## 2020-05-11 DIAGNOSIS — Z86718 Personal history of other venous thrombosis and embolism: Secondary | ICD-10-CM | POA: Diagnosis present

## 2020-05-11 DIAGNOSIS — I249 Acute ischemic heart disease, unspecified: Secondary | ICD-10-CM | POA: Diagnosis not present

## 2020-05-11 DIAGNOSIS — Z7901 Long term (current) use of anticoagulants: Secondary | ICD-10-CM | POA: Diagnosis present

## 2020-05-11 DIAGNOSIS — Z96652 Presence of left artificial knee joint: Secondary | ICD-10-CM

## 2020-05-11 NOTE — Patient Instructions (Addendum)
____________________________________________________________________________________________  Preparing for your procedure (without sedation)  Procedure appointments are limited to planned procedures: . No Prescription Refills. . No disability issues will be discussed. . No medication changes will be discussed.  Instructions: . Oral Intake: Do not eat or drink anything for at least 6 hours prior to your procedure. (Exception: Blood Pressure Medication. See below.) . Transportation: Unless otherwise stated by your physician, you may drive yourself after the procedure. . Blood Pressure Medicine: Do not forget to take your blood pressure medicine with a sip of water the morning of the procedure. If your Diastolic (lower reading)is above 100 mmHg, elective cases will be cancelled/rescheduled. . Blood thinners: These will need to be stopped for procedures. Notify our staff if you are taking any blood thinners. Depending on which one you take, there will be specific instructions on how and when to stop it. . Diabetics on insulin: Notify the staff so that you can be scheduled 1st case in the morning. If your diabetes requires high dose insulin, take only  of your normal insulin dose the morning of the procedure and notify the staff that you have done so. . Preventing infections: Shower with an antibacterial soap the morning of your procedure.  . Build-up your immune system: Take 1000 mg of Vitamin C with every meal (3 times a day) the day prior to your procedure. . Antibiotics: Inform the staff if you have a condition or reason that requires you to take antibiotics before dental procedures. . Pregnancy: If you are pregnant, call and cancel the procedure. . Sickness: If you have a cold, fever, or any active infections, call and cancel the procedure. . Arrival: You must be in the facility at least 30 minutes prior to your scheduled procedure. . Children: Do not bring any children with you. . Dress  appropriately: Bring dark clothing that you would not mind if they get stained. . Valuables: Do not bring any jewelry or valuables.  Reasons to call and reschedule or cancel your procedure: (Following these recommendations will minimize the risk of a serious complication.) . Surgeries: Avoid having procedures within 2 weeks of any surgery. (Avoid for 2 weeks before or after any surgery). . Flu Shots: Avoid having procedures within 2 weeks of a flu shots or . (Avoid for 2 weeks before or after immunizations). . Barium: Avoid having a procedure within 7-10 days after having had a radiological study involving the use of radiological contrast. (Myelograms, Barium swallow or enema study). . Heart attacks: Avoid any elective procedures or surgeries for the initial 6 months after a "Myocardial Infarction" (Heart Attack). . Blood thinners: It is imperative that you stop these medications before procedures. Let us know if you if you take any blood thinner.  . Infection: Avoid procedures during or within two weeks of an infection (including chest colds or gastrointestinal problems). Symptoms associated with infections include: Localized redness, fever, chills, night sweats or profuse sweating, burning sensation when voiding, cough, congestion, stuffiness, runny nose, sore throat, diarrhea, nausea, vomiting, cold or Flu symptoms, recent or current infections. It is specially important if the infection is over the area that we intend to treat. . Heart and lung problems: Symptoms that may suggest an active cardiopulmonary problem include: cough, chest pain, breathing difficulties or shortness of breath, dizziness, ankle swelling, uncontrolled high or unusually low blood pressure, and/or palpitations. If you are experiencing any of these symptoms, cancel your procedure and contact your primary care physician for an evaluation.  Remember:  Regular   Business hours are:  Monday to Thursday 8:00 AM to 4:00  PM  Provider's Schedule: Milinda Pointer, MD:  Procedure days: Tuesday and Thursday 7:30 AM to 4:00 PM  Gillis Santa, MD:  Procedure days: Monday and Wednesday 7:30 AM to 4:00 PM ____________________________________________________________________________________________   Stop eloquis x 3 days prior to knee injection. Dr. Karl Bales. Will need to give permission. Patient states she will probably be able to come off in November. She will call us when she gets approval.

## 2020-05-12 ENCOUNTER — Telehealth: Payer: Self-pay | Admitting: Cardiovascular Disease

## 2020-05-12 NOTE — Telephone Encounter (Addendum)
Medication Samples have been provided to the patient.  Drug name: Eliquis    Strength: 2.5 mg        Qty: LOT: [id number] Exp.Date: 06/2020  Dosing instructions: Take Eliquis 2.5 mg two tablets in the am and 2.5 mg two tablets in the pm to equal 5 mg tablets twice a day.    Dolores Lory 2:55 PM 05/12/2020

## 2020-05-12 NOTE — Telephone Encounter (Signed)
Patient calling the office for samples of medication:   1.  What medication and dosage are you requesting samples for? Eliquis 5 mg bid  2.  Are you currently out of this medication? Yes. Rx just got sent to [facility] mail order that will take two weeks to get here. Patient need samples for 2 weeks for 5 mg bid  Please advise

## 2020-05-16 NOTE — Telephone Encounter (Signed)
Patient calling back in to see if there are any 5 mg eliquis samples available as a follow up from last week

## 2020-05-29 ENCOUNTER — Ambulatory Visit: Payer: Medicare Other | Admitting: Physician Assistant

## 2020-06-04 ENCOUNTER — Other Ambulatory Visit: Payer: Self-pay

## 2020-06-04 ENCOUNTER — Emergency Department: Payer: Medicare Other

## 2020-06-04 ENCOUNTER — Encounter: Payer: Self-pay | Admitting: Emergency Medicine

## 2020-06-04 ENCOUNTER — Emergency Department
Admission: EM | Admit: 2020-06-04 | Discharge: 2020-06-04 | Disposition: A | Discharge status: Home / Self Care | Payer: Medicare Other | Attending: Emergency Medicine | Admitting: Emergency Medicine

## 2020-06-04 DIAGNOSIS — I129 Hypertensive chronic kidney disease with stage 1 through stage 4 chronic kidney disease, or unspecified chronic kidney disease: Secondary | ICD-10-CM | POA: Diagnosis not present

## 2020-06-04 DIAGNOSIS — R079 Chest pain, unspecified: Secondary | ICD-10-CM | POA: Diagnosis not present

## 2020-06-04 DIAGNOSIS — Z96652 Presence of left artificial knee joint: Secondary | ICD-10-CM | POA: Diagnosis not present

## 2020-06-04 DIAGNOSIS — Z79899 Other long term (current) drug therapy: Secondary | ICD-10-CM | POA: Diagnosis not present

## 2020-06-04 DIAGNOSIS — N183 Chronic kidney disease, stage 3 unspecified: Secondary | ICD-10-CM | POA: Insufficient documentation

## 2020-06-04 DIAGNOSIS — R42 Dizziness and giddiness: Secondary | ICD-10-CM | POA: Insufficient documentation

## 2020-06-04 DIAGNOSIS — I10 Essential (primary) hypertension: Secondary | ICD-10-CM

## 2020-06-04 LAB — CBC
HCT: 40.8 % (ref 36.0–46.0)
Hemoglobin: 12.8 g/dL (ref 12.0–15.0)
MCH: 28.1 pg (ref 26.0–34.0)
MCHC: 31.4 g/dL (ref 30.0–36.0)
MCV: 89.7 fL (ref 80.0–100.0)
Platelets: 123 10*3/uL — ABNORMAL LOW (ref 150–400)
RBC: 4.55 MIL/uL (ref 3.87–5.11)
RDW: 14.9 % (ref 11.5–15.5)
WBC: 6.6 10*3/uL (ref 4.0–10.5)
nRBC: 0 % (ref 0.0–0.2)

## 2020-06-04 LAB — TROPONIN I (HIGH SENSITIVITY)
Troponin I (High Sensitivity): 6 ng/L (ref <18)
Troponin I (High Sensitivity): 6 ng/L (ref <18)

## 2020-06-04 LAB — BASIC METABOLIC PANEL
Anion gap: 11 (ref 5–15)
BUN: 22 mg/dL (ref 8–23)
CO2: 23 mmol/L (ref 22–32)
Calcium: 8.8 mg/dL — ABNORMAL LOW (ref 8.9–10.3)
Chloride: 107 mmol/L (ref 98–111)
Creatinine, Ser: 1.2 mg/dL — ABNORMAL HIGH (ref 0.44–1.00)
GFR, Estimated: 48 mL/min — ABNORMAL LOW (ref >60)
Glucose, Bld: 147 mg/dL — ABNORMAL HIGH (ref 70–99)
Potassium: 5 mmol/L (ref 3.5–5.1)
Sodium: 141 mmol/L (ref 135–145)

## 2020-06-04 MED ORDER — AMLODIPINE BESYLATE 2.5 MG PO TABS: 2.5000 mg | ORAL_TABLET | Freq: Every day | ORAL | 11 refills | Status: DC

## 2020-06-04 NOTE — ED Provider Notes (Signed)
[facility] Emergency Department Provider Note   ____________________________________________   First MD Initiated Contact with Patient 06/04/20 1917     (approximate)  I have reviewed the triage vital signs and the nursing notes.   HISTORY  Chief Complaint Dizziness and Chest Pain    HPI Sabrina Green is a 74 y.o. female with a stated past medical history of PE, CKD, chronic pain syndrome, anxiety, hypertension, and GERD who presents for lightheadedness over the last 24 hours.  Patient states that she wears 3 L nasal cannula chronically since she was diagnosed with a PE in August.  Patient states that despite using her oxygen, she has been feeling episodic lightheadedness since yesterday when she got up from sitting down.  Patient does state that she is concerned that this started after beginning a new blood pressure medicine that she cannot recall the name of.  Patient states that she has had hypertension over these last 24 hours as well that she is concerned may be related to this.  Patient also endorses associated throbbing headache that is worse in the morning and gradually reduces over the day.  Patient denies any other exacerbating or relieving factors.         Past Medical History:  Diagnosis Date  . Anemia    vitamin b and d deficiency  . Anxiety   . Arthritis    osteoarthrits  . Chronic pain syndrome   . CKD (chronic kidney disease) stage 3, GFR 30-59 ml/min (HCC)   . Complication of anesthesia    difficulty waking up  . DDD (degenerative disc disease), cervical   . Fibromyalgia    not accurate, per patient  . GERD (gastroesophageal reflux disease)   . Gout   . Hearing loss   . History of kidney stones 1987  . Hypertension   . Rheumatoid arthritis (HCC)    orencia infusions  . Vitamin B12 deficiency   . Vitamin D deficiency     Patient Active Problem List   Diagnosis Date Noted  . Tricompartment osteoarthritis of knee (Right)  05/11/2020  . Osteoarthritis of knee (Right) 05/11/2020  . Chronic anticoagulation (Eliquis) 05/11/2020  . History of deep vein thrombosis (DVT) of lower extremity 05/11/2020  . History of pulmonary embolism 05/11/2020  . Normocytic anemia 04/03/2020  . DVT (deep venous thrombosis) ([place]) 03/16/2020  . Pulmonary embolism with acute cor pulmonale ([place]) 03/10/2020  . Prolonged QT interval 03/08/2020  . Thrombocytopenia ([place]) 03/08/2020  . History of total knee replacement (Left) 09/21/2019  . Osteoarthritis involving multiple joints 08/16/2019  . Bursitis of left shoulder 02/22/2019  . [place] arthritis 02/01/2019  . Carpal tunnel syndrome on right 10/14/2018  . Carpal tunnel syndrome, left 10/14/2018  . Neurogenic pain 03/02/2018  . Chronic low back pain (Secondary Area of Pain) (Bilateral) (L>R) 02/04/2018  . Lumbar facet syndrome (Bilateral) (L>R) 02/04/2018  . Spondylosis without myelopathy or radiculopathy, lumbosacral region 02/04/2018  . History of claustrophobia 01/05/2018  . History of panic attacks 01/05/2018  . Chronic shoulder pain (Bilateral) 01/05/2018  . Osteoarthritis of shoulder (Bilateral) 01/05/2018  . Elevated C-reactive protein (CRP) 12/15/2017  . Chronic pain syndrome 12/11/2017  . Pharmacologic therapy 12/11/2017  . Disorder of skeletal system 12/11/2017  . Problems influencing health status 12/11/2017  . Chronic knee pain (Right) 12/11/2017  . Chronic lower extremity pain [facility] Area of Pain) (Left) 12/11/2017  . Chronic shoulder pain (Fourth Area of Pain) (Right) 12/11/2017  . Chronic hip pain (Left)  12/11/2017  . Fatty liver disease, nonalcoholic 09/32/3557  . DDD (degenerative disc disease), lumbar 06/02/2017  . Chronic low back pain ([place]) 04/22/2017  . CKD (chronic kidney disease) stage 3, GFR 30-59 ml/min 03/28/2017  . BMI 40.0-44.9, adult ([place]) 03/24/2017  . Elevated erythrocyte sedimentation rate 02/12/2017  . Shoulder bursitis (Right) 06/06/2016    . Encounter for long-term (current) use of high-risk medication 03/06/2016  . Long term current use of opiate analgesic 03/06/2016  . Chronic gout 02/26/2016  . Essential hypertension 02/26/2016  . Exercise-induced asthma 02/26/2016  . Fibromyalgia 02/26/2016  . GERD without esophagitis 02/26/2016  . IBS (irritable bowel syndrome) 02/26/2016  . Rheumatoid arthritis involving multiple sites ([place]) 02/26/2016  . Vitamin B12 deficiency 02/26/2016  . Vitamin D deficiency 02/26/2016    Past Surgical History:  Procedure Laterality Date  . ABDOMINAL HYSTERECTOMY    . ABDOMINAL HYSTERECTOMY    . [place], [place].   METAL IN ANKLE  . BREAST BIOPSY Right 02/09/2019   affirm bx of calcs, ribbon marker neg  . CESAREAN SECTION    . CHOLECYSTECTOMY    . EYE SURGERY Bilateral 2015   cataract surgery, [place], [place]  . KNEE ARTHROSCOPY    . PULMONARY THROMBECTOMY N/A 03/10/2020   Procedure: PULMONARY THROMBECTOMY;  Surgeon: Algernon Huxley, MD;  Location: [place] [facility];  Service: Cardiovascular;  Laterality: N/A;  . TONSILLECTOMY    . TOTAL KNEE ARTHROPLASTY Left 09/21/2019   Procedure: TOTAL KNEE ARTHROPLASTY;  Surgeon: Corky Mull, MD;  Location: [facility] ORS;  Service: Orthopedics;  Laterality: Left;  . TUBAL LIGATION      Prior to Admission medications   Medication Sig Start Date End Date Taking? Authorizing Provider  allopurinol (ZYLOPRIM) 300 MG tablet Take 300 mg by mouth daily. In the morning    [provider]  amLODipine (NORVASC) 2.5 MG tablet Take 1 tablet (2.5 mg total) by mouth daily. 06/04/20 06/04/21  Naaman Plummer, MD  apixaban (ELIQUIS) 5 MG TABS tablet Take 1 tablet (5 mg total) by mouth 2 (two) times daily. 04/26/20   Minna Merritts, MD  ascorbic acid (VITAMIN C) 500 MG tablet Take 1 tablet by mouth daily. 03/20/20 03/20/21  [provider]  atorvastatin (LIPITOR) 40 MG tablet Take 1 tablet (40 mg  total) by mouth daily. 04/27/20 04/22/21  Loel Dubonnet, NP  Biotin 5000 MCG TABS Take 5,000 mcg by mouth daily.    [provider]  Cyanocobalamin (VITAMIN B-12) 5000 MCG TBDP Take 5,000 mcg by mouth daily.    [provider]  ferrous sulfate 325 (65 FE) MG tablet Take 1 tablet by mouth daily. 03/20/20 03/20/21  [provider]  fluticasone (FLONASE) 50 MCG/ACT nasal spray Place 2 sprays into both nostrils daily.    [provider]  folic acid (FOLVITE) 322 MCG tablet Take 400 mcg by mouth daily.    [provider]  gabapentin (NEURONTIN) 100 MG capsule Take 1 capsule (100 mg total) by mouth 3 (three) times daily for 14 days. Patient taking differently: Take 300 mg by mouth in the morning, at noon, in the evening, and at bedtime.  03/13/20 04/10/20  Kayleen Memos, DO  labetalol (NORMODYNE) 100 MG tablet Take 1 tablet (100 mg total) by mouth 2 (two) times daily. 04/27/20 04/22/21  Loel Dubonnet, NP  lubiprostone (AMITIZA) 24 MCG capsule Take 24 mcg by mouth 2 (two) times daily as needed  for constipation.     [provider]  magnesium oxide (MAG-OX) 400 MG tablet Take 1 tablet by mouth daily. 02/29/20   [provider]  methotrexate 50 MG/2ML injection Inject 12.5 mg into the vein every Thursday. (0.5 ml)    [provider]  omeprazole (PRILOSEC) 20 MG capsule Take 20 mg by mouth daily before breakfast.     [provider]  tiZANidine (ZANAFLEX) 4 MG capsule Take 4 mg by mouth 2 (two) times daily as needed for muscle spasms.    [provider]  traMADol (ULTRAM) 50 MG tablet Take 1 tablet (50 mg total) by mouth every 6 (six) hours as needed for moderate pain. 09/23/19   Lattie Corns, PA-C    Allergies Penicillins  Family History  Problem Relation Age of Onset  . Breast cancer Other 50  . Hypertension Mother   . Migraines Mother   . Other Father   . Lung cancer Sister     Social History Social  History   Tobacco Use  . Smoking status: Never Smoker  . Smokeless tobacco: Never Used  Vaping Use  . Vaping Use: Never used  Substance Use Topics  . Alcohol use: No  . Drug use: No    Review of Systems Constitutional: No fever/chills Eyes: No visual changes. ENT: No sore throat. Cardiovascular: Denies chest pain. Respiratory: Denies shortness of breath. Gastrointestinal: No abdominal pain.  No nausea, no vomiting.  No diarrhea. Genitourinary: Negative for dysuria. Musculoskeletal: Negative for acute arthralgias Skin: Negative for rash. Neurological: Negative for headaches, weakness/numbness/paresthesias in any extremity Psychiatric: Negative for suicidal ideation/homicidal ideation   ____________________________________________   PHYSICAL EXAM:  VITAL SIGNS: ED Triage Vitals  Enc Vitals Group     BP 06/04/20 1730 (!) 149/52     Pulse Rate 06/04/20 1730 69     Resp 06/04/20 1730 20     Temp 06/04/20 1730 99 F (37.2 C)     Temp Source 06/04/20 1730 Oral     SpO2 06/04/20 1730 99 %     Weight 06/04/20 1732 241 lb (109.3 kg)     Height 06/04/20 1732 5\' 5"  (1.651 m)     Head Circumference --      Peak Flow --      Pain Score 06/04/20 1731 0     Pain Loc --      Pain Edu? --      Excl. in [place]? --    Constitutional: Alert and oriented. Well appearing and in no acute distress. Eyes: Conjunctivae are normal. PERRL. Head: Atraumatic. Nose: No congestion/rhinnorhea. Mouth/Throat: Mucous membranes are moist. Neck: No stridor Cardiovascular: Grossly normal heart sounds.  Good peripheral circulation. Respiratory: Normal respiratory effort.  No retractions.  3 L nasal cannula in place Gastrointestinal: Soft and nontender. No distention. Musculoskeletal: No obvious deformities Neurologic:  Normal speech and language. No gross focal neurologic deficits are appreciated. Skin:  Skin is warm and dry. No rash noted. Psychiatric: Mood and affect are normal. Speech and  behavior are normal.  ____________________________________________   LABS (all labs ordered are listed, but only abnormal results are displayed)  Labs Reviewed  BASIC METABOLIC PANEL - Abnormal; Notable for the following components:      Result Value   Glucose, Bld 147 (*)    Creatinine, Ser 1.20 (*)    Calcium 8.8 (*)    GFR, Estimated 48 (*)    All other components within normal limits  CBC - Abnormal; Notable for  the following components:   Platelets 123 (*)    All other components within normal limits  TROPONIN I (HIGH SENSITIVITY)  TROPONIN I (HIGH SENSITIVITY)   ____________________________________________  EKG  ED ECG REPORT I, Naaman Plummer, the attending physician, personally viewed and interpreted this ECG.  Date: 06/04/2020 EKG Time: 1727 Rate: 68 Rhythm: normal sinus rhythm QRS Axis: normal Intervals: normal ST/T Wave abnormalities: normal Narrative Interpretation: no evidence of acute ischemia  ____________________________________________  RADIOLOGY  ED MD interpretation: 2 view x-ray of the chest shows no evidence of acute intrathoracic process including no pneumothorax, pneumonia, or widened mediastinum  Official radiology report(s): DG Chest 2 View  Result Date: 06/04/2020 CLINICAL DATA:  Chest tightness since yesterday, dizziness EXAM: CHEST - 2 VIEW COMPARISON:  03/10/2020 FINDINGS: Frontal and lateral views of the chest demonstrate a stable cardiac silhouette. Stable atherosclerosis of the aortic arch. No airspace disease, effusion, or pneumothorax. No acute bony abnormalities. IMPRESSION: 1. No acute intrathoracic process. Electronically Signed   By: Randa Ngo M.D.   On: 06/04/2020 18:24    ____________________________________________   PROCEDURES  Procedure(s) performed (including Critical Care):  .1-3 Lead EKG Interpretation Performed by: Naaman Plummer, MD Authorized by: Naaman Plummer, MD     Interpretation: normal     ECG  rate:  87   ECG rate assessment: normal     Rhythm: sinus rhythm     Ectopy: none     Conduction: normal       ____________________________________________   INITIAL IMPRESSION / ASSESSMENT AND PLAN / ED COURSE  As part of my medical decision making, I reviewed the following data within the [facility] notes reviewed and incorporated, Labs reviewed, EKG interpreted, Old chart reviewed, Radiograph reviewed and Notes from prior ED visits reviewed and incorporated      Patient presents with complaints of syncope/presyncope ED Workup:  CBC, BMP, Troponin, BNP, ECG, CXR Differential diagnosis includes HF, ICH, seizure, stroke, HOCM, ACS, aortic dissection, malignant arrhythmia, or GI bleed. Findings: No evidence of acute laboratory abnormalities.  Troponin negative x1 EKG: No e/o STEMI. No evidence of Brugadas sign, delta wave, epsilon wave, significantly prolonged QTc, or malignant arrhythmia.  Disposition: Discharge. Patient is at baseline at this time. Return precautions expressed and understood in person. Advised follow up with primary care provider or clinic physician in next 24 hours.      ____________________________________________   FINAL CLINICAL IMPRESSION(S) / ED DIAGNOSES  Final diagnoses:  Lightheadedness  Primary hypertension     ED Discharge Orders         Ordered    amLODipine (NORVASC) 2.5 MG tablet  Daily,   Status:  Discontinued        06/04/20 2051    amLODipine (NORVASC) 2.5 MG tablet  Daily        06/04/20 2104           Note:  This document was prepared using Dragon voice recognition software and may include unintentional dictation errors.   Naaman Plummer, MD 06/04/20 2256

## 2020-06-04 NOTE — ED Triage Notes (Signed)
Pt states not feeling well since yesterday, reports chest tightness since yesterday, wears oxygen 3L/[place] due to PE in August. Pt reports feeling dizziness since yesterday. Pt talks in complete sentences no respiratory distress noted.

## 2020-06-04 NOTE — Discharge Instructions (Signed)
Please start taking amlodipine 2.5 mg daily by mouth until follow-up with your primary care physician for further management of your blood pressure medications.

## 2020-06-04 NOTE — ED Notes (Signed)
Pt ambulatory to the toilet independently

## 2020-06-12 ENCOUNTER — Inpatient Hospital Stay: Payer: Medicare Other | Attending: Hematology and Oncology

## 2020-06-12 ENCOUNTER — Other Ambulatory Visit: Payer: Self-pay

## 2020-06-12 DIAGNOSIS — E538 Deficiency of other specified B group vitamins: Secondary | ICD-10-CM | POA: Diagnosis not present

## 2020-06-12 DIAGNOSIS — Z7901 Long term (current) use of anticoagulants: Secondary | ICD-10-CM | POA: Diagnosis not present

## 2020-06-12 DIAGNOSIS — I82431 Acute embolism and thrombosis of right popliteal vein: Secondary | ICD-10-CM | POA: Insufficient documentation

## 2020-06-12 DIAGNOSIS — D649 Anemia, unspecified: Secondary | ICD-10-CM | POA: Diagnosis not present

## 2020-06-12 DIAGNOSIS — I2699 Other pulmonary embolism without acute cor pulmonale: Secondary | ICD-10-CM | POA: Diagnosis present

## 2020-06-12 DIAGNOSIS — N1832 Chronic kidney disease, stage 3b: Secondary | ICD-10-CM | POA: Diagnosis not present

## 2020-06-12 DIAGNOSIS — M069 Rheumatoid arthritis, unspecified: Secondary | ICD-10-CM | POA: Insufficient documentation

## 2020-06-12 DIAGNOSIS — I2609 Other pulmonary embolism with acute cor pulmonale: Secondary | ICD-10-CM

## 2020-06-12 LAB — CBC
HCT: 42.5 % (ref 36.0–46.0)
Hemoglobin: 13.2 g/dL (ref 12.0–15.0)
MCH: 27.6 pg (ref 26.0–34.0)
MCHC: 31.1 g/dL (ref 30.0–36.0)
MCV: 88.7 fL (ref 80.0–100.0)
Platelets: 127 10*3/uL — ABNORMAL LOW (ref 150–400)
RBC: 4.79 MIL/uL (ref 3.87–5.11)
RDW: 14.7 % (ref 11.5–15.5)
WBC: 4.9 10*3/uL (ref 4.0–10.5)
nRBC: 0 % (ref 0.0–0.2)

## 2020-06-12 LAB — ANTITHROMBIN III: AntiThromb III Func: 78 % (ref 75–120)

## 2020-06-12 LAB — FERRITIN: Ferritin: 72 ng/mL (ref 11–307)

## 2020-06-13 LAB — PROTEIN C AG + PROTEIN S AG
Protein C, Total: 81 % (ref 60–150)
Protein S Ag, Free: 64 % (ref 61–136)
Protein S Ag, Total: 93 % (ref 60–150)

## 2020-06-13 LAB — PROTEIN S ACTIVITY: Protein S Activity: 54 % — ABNORMAL LOW (ref 63–140)

## 2020-06-13 LAB — PROTEIN C ACTIVITY: Protein C Activity: 99 % (ref 73–180)

## 2020-06-15 NOTE — Progress Notes (Signed)
[facility]  [address] [place], [place] [address] Phone: [phone]  Fax: [phone]   Clinic Day:  06/19/2020  Referring physician: Sofie Hartigan, MD  Chief Complaint: Sabrina Green is a 73 y.o. female with a submassive PE and right popliteal DVT (02/2020), anemia and thrombocytopenia who is seen for 2 month assessment.  HPI: The patient was last seen in the hematology clinic on 04/10/2020. At that time, she was doing well.  She noted some bruising on Eliquis.  Screening mammogram on 04/18/2020 revealed no evidence of malignancy.  Colonoscopy scheduled for 05/02/2020 was rescheduled due to the patient's recent PE. She sees [facility] again on 06/29/2020.  Labs on 04/24/2020 revealed a hematocrit of 36.9, hemoglobin 11.5, platelets 176,000, WBC 8,200. Ferritin was 60 with an iron saturation of 26% and a TIBC of 407. Sed rate was 37. Beta-2-glycoprotein antibodies were normal.  Labs on 06/12/2020 revealed a hematocrit of 42.5, hemoglobin 13.2, platelets 127,000, WBC 4,900. Ferritin was 72. Protein S total antigen was 93%, protein S free antigen was 64%, and protein S activity was 54% (low). Protein C total was 81% and protein C activity was 99%. Antithrombin III activity was 78%.  During the interim, she has been "fine." She reports occasional chest discomfort which she described as "something stuck in her chest." She denies trouble swallowing. She denies leg pain and bleeding of any kind.  The patient is still taking Eliquis. She denies using any new herbal products. She recently started taking Losartan and is doing much better. She is no longer using oxygen but has not been able to return her oxygen tank because the company will not return her calls.  The patient denies a family history of blood clots.  Past Medical History:  Diagnosis Date  . Anemia    vitamin b and d deficiency  . Anxiety   . Arthritis    osteoarthrits  .  Chronic pain syndrome   . CKD (chronic kidney disease) stage 3, GFR 30-59 ml/min (HCC)   . Complication of anesthesia    difficulty waking up  . DDD (degenerative disc disease), cervical   . Fibromyalgia    not accurate, per patient  . GERD (gastroesophageal reflux disease)   . Gout   . Hearing loss   . History of kidney stones 1987  . Hypertension   . Rheumatoid arthritis (HCC)    orencia infusions  . Vitamin B12 deficiency   . Vitamin D deficiency     Past Surgical History:  Procedure Laterality Date  . ABDOMINAL HYSTERECTOMY    . ABDOMINAL HYSTERECTOMY    . [place], [place].   METAL IN ANKLE  . BREAST BIOPSY Right 02/09/2019   affirm bx of calcs, ribbon marker neg  . CESAREAN SECTION    . CHOLECYSTECTOMY    . EYE SURGERY Bilateral 2015   cataract surgery, [place], [place]  . KNEE ARTHROSCOPY    . PULMONARY THROMBECTOMY N/A 03/10/2020   Procedure: PULMONARY THROMBECTOMY;  Surgeon: Algernon Huxley, MD;  Location: [place] [facility];  Service: Cardiovascular;  Laterality: N/A;  . TONSILLECTOMY    . TOTAL KNEE ARTHROPLASTY Left 09/21/2019   Procedure: TOTAL KNEE ARTHROPLASTY;  Surgeon: Corky Mull, MD;  Location: [facility];  Service: Orthopedics;  Laterality: Left;  . TUBAL LIGATION      Family History  Problem Relation Age of Onset  . Breast cancer Other 50  .  Hypertension Mother   . Migraines Mother   . Other Father   . Lung cancer Sister     Social History:  reports that she has never smoked. She has never used smokeless tobacco. She reports that she does not drink alcohol and does not use drugs. The patient denies alcohol or tobacco use. She denies exposure to radiation or toxins. The patient is retired. The patient is alone today.  Her aunt, Valrie Hart, is her medical POA.  Allergies:  Allergies  Allergen Reactions  . Penicillins Hives    Current Medications: Current Outpatient Medications   Medication Sig Dispense Refill  . allopurinol (ZYLOPRIM) 300 MG tablet Take 300 mg by mouth daily. In the morning    . apixaban (ELIQUIS) 5 MG TABS tablet Take 1 tablet (5 mg total) by mouth 2 (two) times daily. 180 tablet 1  . ascorbic acid (VITAMIN C) 500 MG tablet Take 1 tablet by mouth daily.    Marland Kitchen atorvastatin (LIPITOR) 40 MG tablet Take 1 tablet (40 mg total) by mouth daily. 90 tablet 3  . Biotin 5000 MCG TABS Take 5,000 mcg by mouth daily.    . Cyanocobalamin (VITAMIN B-12) 5000 MCG TBDP Take 5,000 mcg by mouth daily.    . ferrous sulfate 325 (65 FE) MG tablet Take 1 tablet by mouth daily.    . fluticasone (FLONASE) 50 MCG/ACT nasal spray Place 2 sprays into both nostrils daily.    . folic acid (FOLVITE) 008 MCG tablet Take 400 mcg by mouth daily.    Marland Kitchen gabapentin (NEURONTIN) 100 MG capsule Take 1 capsule (100 mg total) by mouth 3 (three) times daily for 14 days. (Patient taking differently: Take 300 mg by mouth in the morning, at noon, in the evening, and at bedtime. ) 42 capsule 0  . labetalol (NORMODYNE) 100 MG tablet Take 1 tablet (100 mg total) by mouth 2 (two) times daily. 180 tablet 3  . losartan (COZAAR) 25 MG tablet Take 25 mg by mouth daily.    Marland Kitchen lubiprostone (AMITIZA) 24 MCG capsule Take 24 mcg by mouth 2 (two) times daily as needed for constipation.     . magnesium oxide (MAG-OX) 400 MG tablet Take 1 tablet by mouth daily.    . methotrexate 50 MG/2ML injection Inject 12.5 mg into the vein every Thursday. (0.5 ml)    . omeprazole (PRILOSEC) 20 MG capsule Take 20 mg by mouth daily before breakfast.     . amLODipine (NORVASC) 2.5 MG tablet Take 1 tablet (2.5 mg total) by mouth daily. (Patient not taking: Reported on 06/19/2020) 30 tablet 11  . tiZANidine (ZANAFLEX) 4 MG capsule Take 4 mg by mouth 2 (two) times daily as needed for muscle spasms. (Patient not taking: Reported on 06/19/2020)    . traMADol (ULTRAM) 50 MG tablet Take 1 tablet (50 mg total) by mouth every 6 (six) hours  as needed for moderate pain. (Patient not taking: Reported on 06/19/2020) 30 tablet 0   No current facility-administered medications for this visit.   Review of Systems  Constitutional: Negative for chills, diaphoresis, fever, malaise/fatigue and weight loss (stable).       Feels "fine."  HENT: Negative for congestion, ear discharge, ear pain, hearing loss, nosebleeds, sinus pain, sore throat and tinnitus.   Eyes: Negative for blurred vision.  Respiratory: Negative for cough, hemoptysis, sputum production and shortness of breath.        No longer on oxygen.  Cardiovascular: Positive for chest pain (occasional discomfort). Negative  for palpitations and leg swelling.  Gastrointestinal: Negative for abdominal pain, blood in stool, constipation, diarrhea, heartburn, melena, nausea and vomiting.  Genitourinary: Negative for dysuria, frequency, hematuria and urgency.  Musculoskeletal: Negative for back pain, joint pain (rheumatoid arthritis), myalgias and neck pain.  Skin: Negative for itching and rash.  Neurological: Negative for dizziness, tingling, sensory change, weakness and headaches.  Endo/Heme/Allergies: Does not bruise/bleed easily.  Psychiatric/Behavioral: Negative for depression and memory loss. The patient is not nervous/anxious and does not have insomnia.   All other systems reviewed and are negative.  Performance status (ECOG): 1  Vitals Blood pressure (!) 152/53, pulse 66, temperature (!) 96.4 F (35.8 C), temperature source Tympanic, weight 244 lb 0.8 oz (110.7 kg), SpO2 100 %.   Physical Exam Vitals and nursing note reviewed.  Constitutional:      General: She is not in acute distress.    Appearance: She is not diaphoretic.  HENT:     Head: Normocephalic and atraumatic.     Comments: Short brown styled hair.    Mouth/Throat:     Mouth: Mucous membranes are moist.     Pharynx: Oropharynx is clear.  Eyes:     General: No scleral icterus.    Extraocular Movements:  Extraocular movements intact.     Conjunctiva/sclera: Conjunctivae normal.     Pupils: Pupils are equal, round, and reactive to light.  Cardiovascular:     Rate and Rhythm: Normal rate and regular rhythm.     Heart sounds: Normal heart sounds. No murmur heard.   Pulmonary:     Effort: Pulmonary effort is normal. No respiratory distress.     Breath sounds: Normal breath sounds. No wheezing or rales.  Chest:     Chest wall: No tenderness.  Abdominal:     General: Bowel sounds are normal. There is no distension.     Palpations: Abdomen is soft. There is no mass.     Tenderness: There is no abdominal tenderness. There is no guarding or rebound.  Musculoskeletal:        General: No swelling or tenderness. Normal range of motion.     Cervical back: Normal range of motion and neck supple.  Lymphadenopathy:     Head:     Right side of head: No preauricular, posterior auricular or occipital adenopathy.     Left side of head: No preauricular, posterior auricular or occipital adenopathy.     Cervical: No cervical adenopathy.     Upper Body:     Right upper body: No supraclavicular or axillary adenopathy.     Left upper body: No supraclavicular or axillary adenopathy.     Lower Body: No right inguinal adenopathy. No left inguinal adenopathy.  Skin:    General: Skin is warm and dry.  Neurological:     Mental Status: She is alert and oriented to person, place, and time.  Psychiatric:        Behavior: Behavior normal.        Thought Content: Thought content normal.        Judgment: Judgment normal.     No visits with results within 3 Day(s) from this visit.  Latest known visit with results is:  Appointment on 06/12/2020  Component Date Value Ref Range Status  . Protein S Activity 06/12/2020 54* 63 - 140 % Final   Comment: (NOTE) A deficiency of protein S (PS), either congenital or acquired, increases the risk of thromboembolism. PS activity levels may be falsely low in individuals  with APCR/Factor V Leiden. Consider performing free protein S antigen in those with APCR/Factor V Leiden before making a diagnosis of protein S deficiency. Acquired PS deficiency is more common than congenital deficiency. PS values decrease with normal pregnancy, and are also dependent on age, sex and hormone status. PS values tend to be lower in a younger age group and lower in women than in men. Levels may be decreased in pre-menopausal women on oral contraceptive agents. Acquired deficiency can occur as a result of vitamin K deficiency or antagonism, severe hepatic disorders, (hepatitis, cirrhosis, etc.), nephrotic syndrome, inflammatory bowel disease, certain chemotherapeutic agents, L-asparaginse therapy, sepsis, disseminated intravascular coagulation (DIC) and acute thrombosis. Levels may be decreased in                           patients with polycythemia vera, sickle cell disease and essential thrombocythemia. Repeat evaluation on a new plasma sample to confirm or refute this result should be considered, after ruling out acquired causes, depending on the clinical scenario. Performed At: [facility] [place], [place] [id number] Rush Farmer MD ZH:[id number]   . Protein C Activity 06/12/2020 99  73 - 180 % Final   Comment: (NOTE) Performed At: [facility] [place], [place] [id number] Rush Farmer MD [id number]   . Protein C, Total 06/12/2020 81  60 - 150 % Final  . Protein S Ag, Total 06/12/2020 93  60 - 150 % Final   Comment: (NOTE) This test was developed and its performance characteristics determined by [facility]. It has not been cleared or approved by the Food and Drug Administration.   . Protein S Ag, Free 06/12/2020 64  61 - 136 % Final   Comment: (NOTE) Performed At: [facility] [place], [place] [id number] Rush Farmer MD [id number]   . AntiThromb III Func 06/12/2020 78  75 - 120 %  Final   Performed at [facility] [address] [place], [place] [address]  . Ferritin 06/12/2020 72  11 - 307 ng/mL Final   Performed at [facility], [address] [place], [place] [address]  . WBC 06/12/2020 4.9  4.0 - 10.5 K/uL Final  . RBC 06/12/2020 4.79  3.87 - 5.11 MIL/uL Final  . Hemoglobin 06/12/2020 13.2  12.0 - 15.0 g/dL Final  . HCT 06/12/2020 42.5  36 - 46 % Final  . MCV 06/12/2020 88.7  80.0 - 100.0 fL Final  . MCH 06/12/2020 27.6  26.0 - 34.0 pg Final  . MCHC 06/12/2020 31.1  30.0 - 36.0 g/dL Final  . RDW 06/12/2020 14.7  11.5 - 15.5 % Final  . Platelets 06/12/2020 127* 150 - 400 K/uL Final  . nRBC 06/12/2020 0.0  0.0 - 0.2 % Final   Performed at [facility], [address] [place],  [address]    Assessment:  Sabrina Green is a 74 y.o. female a submassive pulmonary embolism and right popliteal DVT. She underwent pulmonary thrombectomy and catheter directed TPA on 03/10/2020.  She is on Eliquis.  Chest CT angiogram revealed an acute PE with right heart strain (RV/LV Ratio = 1.5) c/w at least submassive (intermediate risk) PE. Bilateral lower extremity venous duplex revealed a right popliteal DVT.   Work-up on 03/28/2020 revealed a hematocrit of 29.9, hemoglobin 9.1, MCV 92.3, platelets 205,000, WBC 5,100. Platelet count in a blue top was 194,000. Creatinine was 1.33 (CrCl 46 ml/min).  Ferritin was 160 with an iron saturation of 10% and a TIBC of 358. Reticulocyte count was 4.0%. Vitamin B12 was 1,430 and folate was 15.8. Hepatitis B core antibody, hepatitis C antibody, and HIV antibody were negative. ANA was negative. Factor V Leiden, prothrombin gene mutation, lupus anticoagulant panel, anti-cardiolipin antibodies were negative, and beta2-glycoprotein was negative.  Labs on 06/12/2020 revealed a normal protein S total antigen (93%), protein C total (81%), protein C activity (99%), and antithrombin III activity (78%).  Protein S free  antigen was borderline (64%; 61-136%), and protein S activity was low (54%; 63-140%).   She has chronic thrombocytopenia. Platelet count has been followed: 145,000 on 02/26/2016, 169,000 on 06/02/2017, 132,000 on 09/07/2017, 135,000 on 09/23/2019, 102,000 on 03/13/2020, 205,000 on 03/28/2020, 176,000 on 04/24/2020, 123,000 on 06/04/2020 and 127,000 on 06/12/2020.  She has B12 deficiency and is on oral B12.  B12 was 1430 on 03/28/2020.  Abdomen and pelvis CT without contrast on 04/07/2020 revealed no pathologically enlarged lymph nodes.  Liver and spleen were normal. There was residual inflammatory ground-glass in the lung bases, related to pulmonary emboli seen on 03/11/2020. There was aortic atherosclerosis and coronary artery calcification.  She has renal insufficiency.  Creatinine was 1.94 (CrCl 32.8 ml/min) on 03/13/2020.  She has rheumatoid arthritis and has received Orencia infusions monthly for about 2 years.  Symptomatically, she feels "fine." She reports occasional chest discomfort.  She denies trouble swallowing. She denies leg pain.  She is on Eliquis; she denies any bleeding.  She is off oxygen.  Plan: 1.   Review labs from 06/12/2020. 2.   Pulmonary embolism  She has had a submassive pulmonary embolism.  Hypercoagulable work-up revealed a borderline protein test antigen and low protein S activity.  Additional evaluation:   Abdomen and pelvis CT scan revealed no evidence of malignancy.   Screening mammogram on 04/18/2020 revealed no evidence of malignancy.     Colonoscopy has been rescheduled.  Plan for lifelong anticoagulation.  Continue Eliquis. 3.   Thrombocytopenia  Platelet count fluctuating.    Platelet count 102,000 on 03/13/2020, 205,000 on 03/28/2020 and 127,000 on 06/12/2020.  ANA, hepatitis B core antibody, hepatitis C antibody, and HIV testing was negative.  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  Continue to monitor. 4.   Normocytic anemia,  resolved  Hematocrit 29.9.  Hemoglobin   9.1.  MCV 92.3 on 03/28/2020.  Hematocrit 42.5.  Hemoglobin 13.2.  MCV 88.7 on 06/12/2020.  SPEP and UPEP in 01/2017 revealed no monoclonal protein.   Ferritin 160 with an iron saturation of 10% and a TIBC of 358.  B12 and folate were normal.  Bilirubin 0.5.  Retic was 4% indicating an appropriate marrow response.  Etiology felt secondary to chronic renal insufficiency, improving.   Creatinine 1.94 on 03/13/2020 and 1.20 on 06/04/2020. 5.   RTC in 3 months for labs (CBC with diff, platelet count in a citrate tube, CMP, protein S free and protein S activity).  I discussed the assessment and treatment plan with the patient.  The patient was provided an opportunity to ask questions and all were answered.  The patient agreed with the plan and demonstrated an understanding of the instructions.  The patient was advised to call back if the symptoms worsen or if the condition fails to improve as anticipated.  I provided 18 minutes of face-to-face time during this this encounter and > 50% was spent counseling as documented under my assessment and plan.     Elmarie Mainland, MD, PhD    06/19/2020, 10:24 AM  I, Mirian Mo Tufford, am acting as Education administrator for [employer]. Mike Gip, MD, PhD.  I,  C. Mike Gip, MD, have reviewed the above documentation for accuracy and completeness, and I agree with the above.

## 2020-06-19 ENCOUNTER — Encounter: Payer: Self-pay | Admitting: Hematology and Oncology

## 2020-06-19 ENCOUNTER — Inpatient Hospital Stay: Payer: Medicare Other | Attending: Hematology and Oncology | Admitting: Hematology and Oncology

## 2020-06-19 ENCOUNTER — Other Ambulatory Visit: Payer: Self-pay

## 2020-06-19 VITALS — BP 152/53 | HR 66 | Temp 96.4°F | Wt 244.0 lb

## 2020-06-19 DIAGNOSIS — D649 Anemia, unspecified: Secondary | ICD-10-CM | POA: Diagnosis not present

## 2020-06-19 DIAGNOSIS — I2782 Chronic pulmonary embolism: Secondary | ICD-10-CM | POA: Diagnosis not present

## 2020-06-19 DIAGNOSIS — Z79899 Other long term (current) drug therapy: Secondary | ICD-10-CM | POA: Insufficient documentation

## 2020-06-19 DIAGNOSIS — I249 Acute ischemic heart disease, unspecified: Secondary | ICD-10-CM | POA: Diagnosis not present

## 2020-06-19 DIAGNOSIS — I519 Heart disease, unspecified: Secondary | ICD-10-CM | POA: Insufficient documentation

## 2020-06-19 DIAGNOSIS — D696 Thrombocytopenia, unspecified: Secondary | ICD-10-CM | POA: Diagnosis not present

## 2020-06-19 DIAGNOSIS — Z803 Family history of malignant neoplasm of breast: Secondary | ICD-10-CM | POA: Insufficient documentation

## 2020-06-19 DIAGNOSIS — N189 Chronic kidney disease, unspecified: Secondary | ICD-10-CM | POA: Insufficient documentation

## 2020-06-19 DIAGNOSIS — I82431 Acute embolism and thrombosis of right popliteal vein: Secondary | ICD-10-CM | POA: Diagnosis not present

## 2020-06-19 DIAGNOSIS — Z801 Family history of malignant neoplasm of trachea, bronchus and lung: Secondary | ICD-10-CM | POA: Diagnosis not present

## 2020-06-19 DIAGNOSIS — E538 Deficiency of other specified B group vitamins: Secondary | ICD-10-CM

## 2020-06-19 DIAGNOSIS — Z96652 Presence of left artificial knee joint: Secondary | ICD-10-CM | POA: Insufficient documentation

## 2020-06-19 DIAGNOSIS — M069 Rheumatoid arthritis, unspecified: Secondary | ICD-10-CM | POA: Diagnosis not present

## 2020-06-19 DIAGNOSIS — I2699 Other pulmonary embolism without acute cor pulmonale: Secondary | ICD-10-CM | POA: Insufficient documentation

## 2020-06-19 DIAGNOSIS — I2609 Other pulmonary embolism with acute cor pulmonale: Secondary | ICD-10-CM

## 2020-06-19 DIAGNOSIS — Z7901 Long term (current) use of anticoagulants: Secondary | ICD-10-CM | POA: Diagnosis not present

## 2020-06-19 NOTE — Patient Instructions (Signed)
  Please call if any excess bruising or bleeding.   

## 2020-06-19 NOTE — Progress Notes (Signed)
Patient here for oncology follow-up appointment, expresses complaints of constipation, headaches from blood pressure and chest discomfort.

## 2020-07-30 NOTE — Progress Notes (Unsigned)
Cardiology Office Note  Date:  07/31/2020   ID:  Sabrina, Green [birth year], MRN [id number]  PCP:  Sofie Hartigan, MD   Chief Complaint  Patient presents with  . Other    3 month follow up. ED 05/2020 -  Patient c.o some chest pressure. Meds reviewed verbally with patient.     HPI:  Sabrina Green is a 75 y.o. female with a hx of  PE/DVT with right heart strain s/p pulmonary thrombectomy and TPA 02/2020, CKD III,  RA,  anemia of chronic disease,  fibromyalgia,  HTN, HLD, DDD, chronic back pain, OA, obesity  left TKA in 08/2019 which was felt to be possibly contributory to DVT/PE.   [facility] 03/08/20-03/13/20 with dizziness, generalized malaise, fatigue, dyspnea. HS-troponin peaked at 714.  EKG with NSR and inferior and anterolateral TWI.  Echo with EF 55-60%, no RWMA, gr1DD, mildly reduced RVSF with severely enlraged RV cavity size and severely elevated PASP estimated at 64.62mmHg, moderately dilated RA, mild TR.  D-dimer >7,500.   Chest CTA with bilateral large PE involving central pulmonary arteries extending into lobar branches.   Bilateral LE U/S with R popliteal DVT.  Underwent thrombolysis with TPA right pulmonary artery, left pulmonary artery, mechanical thrombectomy left upper lobe and left lower lobe pulmonary arteries also with thrombectomy right middle lobe and right lower lobe   Non contrast CT consistent with pulmonary hemorrhage.   She was discharged on supplemental oxygen recommended to f/u with PCP, hematology, cardiology.   ER 06/04/20: dizzy, chest pain Started on amlodipine 2.5 Could not tolerate it, "could not function"  PMD restarted losartan 25 BP better  Just getting over covid 07/19/20 Feeling better  EKG personally reviewed by myself on todays visit NSR rate 67 bpm, with PAC   PMH:   has a past medical history of Anemia, Anxiety, Arthritis, Chronic pain syndrome, CKD (chronic kidney disease) stage 3, GFR 30-59 ml/min (HCC), Complication of  anesthesia, DDD (degenerative disc disease), cervical, Fibromyalgia, GERD (gastroesophageal reflux disease), Gout, Hearing loss, History of kidney stones (1987), Hypertension, Rheumatoid arthritis ([place]), Vitamin B12 deficiency, and Vitamin D deficiency.  PSH:    Past Surgical History:  Procedure Laterality Date  . ABDOMINAL HYSTERECTOMY    . ABDOMINAL HYSTERECTOMY    . [place], [place].   METAL IN ANKLE  . BREAST BIOPSY Right 02/09/2019   affirm bx of calcs, ribbon marker neg  . CESAREAN SECTION    . CHOLECYSTECTOMY    . EYE SURGERY Bilateral 2015   cataract surgery, [place], [place]  . KNEE ARTHROSCOPY    . PULMONARY THROMBECTOMY N/A 03/10/2020   Procedure: PULMONARY THROMBECTOMY;  Surgeon: Algernon Huxley, MD;  Location: [facility];  Service: Cardiovascular;  Laterality: N/A;  . TONSILLECTOMY    . TOTAL KNEE ARTHROPLASTY Left 09/21/2019   Procedure: TOTAL KNEE ARTHROPLASTY;  Surgeon: Corky Mull, MD;  Location: [facility];  Service: Orthopedics;  Laterality: Left;  . TUBAL LIGATION      Current Outpatient Medications  Medication Sig Dispense Refill  . abatacept (ORENCIA) 250 MG injection Inject into the vein.    Marland Kitchen allopurinol (ZYLOPRIM) 300 MG tablet Take 300 mg by mouth daily. In the morning    . apixaban (ELIQUIS) 5 MG TABS tablet Take 1 tablet (5 mg total) by mouth 2 (two) times daily. 180 tablet 1  . ascorbic acid (VITAMIN C) 500 MG tablet Take 1 tablet by mouth daily.    Marland Kitchen  atorvastatin (LIPITOR) 40 MG tablet Take 1 tablet (40 mg total) by mouth daily. 90 tablet 3  . Biotin 5000 MCG TABS Take 5,000 mcg by mouth daily.    . chlorthalidone (HYGROTON) 25 MG tablet Take 12.5 mg by mouth daily.    . Cyanocobalamin (VITAMIN B-12) 5000 MCG TBDP Take 5,000 mcg by mouth daily.    . ferrous sulfate 325 (65 FE) MG tablet Take 1 tablet by mouth daily.    . fluticasone (FLONASE) 50 MCG/ACT nasal spray Place 2 sprays into both  nostrils daily.    . folic acid (FOLVITE) 400 MCG tablet Take 400 mcg by mouth daily.    Marland Kitchen gabapentin (NEURONTIN) 100 MG capsule Take 1 capsule (100 mg total) by mouth 3 (three) times daily for 14 days. 42 capsule 0  . labetalol (NORMODYNE) 100 MG tablet Take 1 tablet (100 mg total) by mouth 2 (two) times daily. 180 tablet 3  . losartan (COZAAR) 25 MG tablet Take 25 mg by mouth daily.    Marland Kitchen lubiprostone (AMITIZA) 24 MCG capsule Take 24 mcg by mouth 2 (two) times daily as needed for constipation.     . magnesium oxide (MAG-OX) 400 MG tablet Take 1 tablet by mouth daily.    . methotrexate 50 MG/2ML injection Inject 12.5 mg into the vein every Thursday. (0.5 ml)    . omeprazole (PRILOSEC) 20 MG capsule Take 20 mg by mouth daily before breakfast.     . tiZANidine (ZANAFLEX) 4 MG capsule Take 4 mg by mouth 2 (two) times daily as needed for muscle spasms.    . traMADol (ULTRAM) 50 MG tablet Take 1 tablet (50 mg total) by mouth every 6 (six) hours as needed for moderate pain. 30 tablet 0   No current facility-administered medications for this visit.     Allergies:   Penicillins   Social History:  The patient  reports that she has never smoked. She has never used smokeless tobacco. She reports that she does not drink alcohol and does not use drugs.   Family History:   family history includes Breast cancer (age of onset: 34) in an other family member; Hypertension in her mother; Lung cancer in her sister; Migraines in her mother; Other in her father.    Review of Systems: Review of Systems  Constitutional: Negative.   HENT: Negative.   Respiratory: Negative.   Cardiovascular: Negative.   Gastrointestinal: Negative.   Musculoskeletal: Negative.   Neurological: Negative.   Psychiatric/Behavioral: Negative.   All other systems reviewed and are negative.   PHYSICAL EXAM: VS:  BP 130/62 (BP Location: Right Arm, Patient Position: Sitting, Cuff Size: Normal)   Pulse 67   Ht 5\' 5"  (1.651 m)    Wt 245 lb (111.1 kg)   SpO2 98%   BMI 40.77 kg/m  , BMI Body mass index is 40.77 kg/m. GEN: Well nourished, well developed, in no acute distress HEENT: normal Neck: no JVD, carotid bruits, or masses Cardiac: RRR; no murmurs, rubs, or gallops,no edema  Respiratory:  clear to auscultation bilaterally, normal work of breathing GI: soft, nontender, nondistended, + BS MS: no deformity or atrophy Skin: warm and dry, no rash Neuro:  Strength and sensation are intact Psych: euthymic mood, full affect   Recent Labs: 03/13/2020: Magnesium 1.6 03/28/2020: ALT 13 06/04/2020: BUN 22; Creatinine, Ser 1.20; Potassium 5.0; Sodium 141 06/12/2020: Hemoglobin 13.2; Platelets 127    Lipid Panel Lab Results  Component Value Date   CHOL 183 03/09/2020  HDL 46 03/09/2020   LDLCALC 121 (H) 03/09/2020   TRIG 80 03/09/2020      Wt Readings from Last 3 Encounters:  07/31/20 245 lb (111.1 kg)  06/19/20 244 lb 0.8 oz (110.7 kg)  06/04/20 241 lb (109.3 kg)       ASSESSMENT AND PLAN:  Problem List Items Addressed This Visit      Cardiology Problems   Essential hypertension   Relevant Medications   chlorthalidone (HYGROTON) 25 MG tablet     Other   Chronic anticoagulation (Eliquis) (Chronic)   Relevant Orders   EKG 12-Lead   CKD (chronic kidney disease) stage 3, GFR 30-59 ml/min    Other Visit Diagnoses    Acute pulmonary embolism without acute cor pulmonale, unspecified pulmonary embolism type (HCC)    -  Primary   Relevant Medications   chlorthalidone (HYGROTON) 25 MG tablet   Other Relevant Orders   EKG 12-Lead   ECHOCARDIOGRAM COMPLETE   Hyperlipidemia LDL goal <70       Relevant Medications   chlorthalidone (HYGROTON) 25 MG tablet   Demand ischemia (HCC)       Relevant Medications   chlorthalidone (HYGROTON) 25 MG tablet      2. Atypical chest pain  No further episodes, we have ordered repeat echocardiogram to evaluate right heart strain  3. PVC  - Asymptomatic  4. Bilateral PE s/p thrombolysis/right popliteal DVT -  in setting of left TKA in February 2021.  On Eliquis 5 twice daily Repeat echocardiogram as above to evaluate right heart strain  5. Coronary artery calcification - Continue statin, beta-blocker.   No aspirin secondary to chronic anticoagulation. We will continue to monitor cholesterol level  6. Chronic hypoxic respiratory failure  -in setting of PE.  Recent Covid, reports breathing is improved  7. CKDIII -  Avoid nephrotoxic agents.  Continue to follow with nephrology.  8. HTN -  BP well controlled.   9. HLD  On statin   Total encounter time more than 25 minutes  Greater than 50% was spent in counseling and coordination of care with the patient    Signed, Dossie Arbour, M.D., Ph.D. [facility] [place], [place] [phone]

## 2020-07-31 ENCOUNTER — Other Ambulatory Visit: Payer: Self-pay

## 2020-07-31 ENCOUNTER — Ambulatory Visit (INDEPENDENT_AMBULATORY_CARE_PROVIDER_SITE_OTHER): Payer: Medicare Other | Admitting: Cardiovascular Disease

## 2020-07-31 ENCOUNTER — Encounter: Payer: Self-pay | Admitting: Cardiovascular Disease

## 2020-07-31 VITALS — BP 130/62 | HR 67 | Ht 65.0 in | Wt 245.0 lb

## 2020-07-31 DIAGNOSIS — I248 Other forms of acute ischemic heart disease: Secondary | ICD-10-CM | POA: Diagnosis not present

## 2020-07-31 DIAGNOSIS — I2699 Other pulmonary embolism without acute cor pulmonale: Secondary | ICD-10-CM

## 2020-07-31 DIAGNOSIS — E785 Hyperlipidemia, unspecified: Secondary | ICD-10-CM

## 2020-07-31 DIAGNOSIS — Z7901 Long term (current) use of anticoagulants: Secondary | ICD-10-CM | POA: Diagnosis not present

## 2020-07-31 DIAGNOSIS — I10 Essential (primary) hypertension: Secondary | ICD-10-CM

## 2020-07-31 DIAGNOSIS — N183 Chronic kidney disease, stage 3 unspecified: Secondary | ICD-10-CM

## 2020-07-31 NOTE — Patient Instructions (Addendum)
Medication Instructions:  No changes  If you need a refill on your cardiac medications before your next appointment, please call your pharmacy.    Lab work: No new labs needed   If you have labs (blood work) drawn today and your tests are completely normal, you will receive your results only by: Marland Kitchen MyChart Message (if you have MyChart) OR . A paper copy in the mail If you have any lab test that is abnormal or we need to change your treatment, we will call you to review the results.   Testing/Procedures: Echocardiogram for pulmonary embolism, right heart strain.  Your physician has requested that you have an echocardiogram. Echocardiography is a painless test that uses sound waves to create images of your heart. It provides your doctor with information about the size and shape of your heart and how well your heart's chambers and valves are working. This procedure takes approximately one hour. There are no restrictions for this procedure.  There is a possibility that an IV may need to be started during your test to inject an image enhancing agent. This is done to obtain more optimal pictures of your heart. Therefore we ask that you do at least drink some water prior to coming in to hydrate your veins.     Follow-Up: At [facility], you and your health needs are our priority.  As part of our continuing mission to provide you with exceptional heart care, we have created designated Provider Care Teams.  These Care Teams include your primary Cardiologist (physician) and Advanced Practice Providers (APPs -  Physician Assistants and Nurse Practitioners) who all work together to provide you with the care you need, when you need it.  . You will need a follow up appointment in 6 months, app ok  . Providers on your designated Care Team:   . Murray Hodgkins, NP . Christell Faith, PA-C . Marrianne Mood, PA-C  Any Other Special Instructions Will Be Listed Below (If Applicable).  COVID-19  Vaccine Information can be found at: ShippingScam.co.uk For questions related to vaccine distribution or appointments, please email [email or web address] or call [phone].     Echocardiogram An echocardiogram is a procedure that uses painless sound waves (ultrasound) to produce an image of the heart. Images from an echocardiogram can provide important information about:  Signs of coronary artery disease (CAD).  Aneurysm detection. An aneurysm is a weak or damaged part of an artery wall that bulges out from the normal force of blood pumping through the body.  Heart size and shape. Changes in the size or shape of the heart can be associated with certain conditions, including heart failure, aneurysm, and CAD.  Heart muscle function.  Heart valve function.  Signs of a past heart attack.  Fluid buildup around the heart.  Thickening of the heart muscle.  A tumor or infectious growth around the heart valves. Tell a health care provider about:  Any allergies you have.  All medicines you are taking, including vitamins, herbs, eye drops, creams, and over-the-counter medicines.  Any blood disorders you have.  Any surgeries you have had.  Any medical conditions you have.  Whether you are pregnant or may be pregnant. What are the risks? Generally, this is a safe procedure. However, problems may occur, including:  Allergic reaction to dye (contrast) that may be used during the procedure. What happens before the procedure? No specific preparation is needed. You may eat and drink normally. What happens during the procedure?   An IV tube  may be inserted into one of your veins.  You may receive contrast through this tube. A contrast is an injection that improves the quality of the pictures from your heart.  A gel will be applied to your chest.  A wand-like tool (transducer) will be moved over your chest. The gel will  help to transmit the sound waves from the transducer.  The sound waves will harmlessly bounce off of your heart to allow the heart images to be captured in real-time motion. The images will be recorded on a computer. The procedure may vary among health care providers and hospitals. What happens after the procedure?  You may return to your normal, everyday life, including diet, activities, and medicines, unless your health care provider tells you not to do that. Summary  An echocardiogram is a procedure that uses painless sound waves (ultrasound) to produce an image of the heart.  Images from an echocardiogram can provide important information about the size and shape of your heart, heart muscle function, heart valve function, and fluid buildup around your heart.  You do not need to do anything to prepare before this procedure. You may eat and drink normally.  After the echocardiogram is completed, you may return to your normal, everyday life, unless your health care provider tells you not to do that. This information is not intended to replace advice given to you by your health care provider. Make sure you discuss any questions you have with your health care provider. Document Revised: 11/05/2018 Document Reviewed: 08/17/2016 Elsevier Patient Education  2020 [employer].

## 2020-08-07 ENCOUNTER — Telehealth: Payer: Self-pay | Admitting: *Deleted

## 2020-08-07 ENCOUNTER — Telehealth: Payer: Self-pay

## 2020-08-07 NOTE — Telephone Encounter (Signed)
Patient called reporting that she had called after Christmas reporting that she is having rectal bleeding and she did not hear back from [place]. She called reporting that she is still having intermittent rectal bleeding (dark red color) as well as nose bleeding. She is on Eliquis. She has had COVID 07/19/20 and has a daughter with her that just tested positive for COVID Saturday. Please advise

## 2020-08-07 NOTE — Telephone Encounter (Signed)
I spoke with patient in regards to patient having rectal bleeding. I stated to patient that she needs to go to the ER for rectal bleeding. I also stated to patient that the reason why she couldn't be see in office was because of her recent COVID exposure.

## 2020-08-07 NOTE — Telephone Encounter (Signed)
  Please call patient.  Patient is on Eliquis for a submassive PE and right popliteal DVT (02/2020).  Platelet count  was slightly low in 05/2020.  She needs to be seen in the ER as she has rectal bleeding and COVID with recent new exposure (daughter tested + on 08/05/2020).  Patient cannot be seen in clinic because of COVID exposure.  M

## 2020-08-18 ENCOUNTER — Ambulatory Visit (INDEPENDENT_AMBULATORY_CARE_PROVIDER_SITE_OTHER): Payer: Medicare Other

## 2020-08-18 ENCOUNTER — Other Ambulatory Visit: Payer: Self-pay

## 2020-08-18 ENCOUNTER — Other Ambulatory Visit: Payer: Medicare Other

## 2020-08-18 DIAGNOSIS — I2699 Other pulmonary embolism without acute cor pulmonale: Secondary | ICD-10-CM

## 2020-08-18 DIAGNOSIS — I517 Cardiomegaly: Secondary | ICD-10-CM | POA: Diagnosis not present

## 2020-08-18 LAB — ECHOCARDIOGRAM COMPLETE
AR max vel: 2.41 cm2
AV Area VTI: 2.85 cm2
AV Area mean vel: 2.29 cm2
AV Mean grad: 4 mmHg
AV Peak grad: 7.1 mmHg
Ao pk vel: 1.33 m/s
Area-P 1/2: 3.31 cm2
P 1/2 time: 308 msec
S' Lateral: 3 cm

## 2020-08-22 ENCOUNTER — Telehealth: Payer: Self-pay

## 2020-08-22 NOTE — Telephone Encounter (Signed)
Able to reach pt regarding her recent echo,  Dr. Rockey Situ had a chance to review her results and advised   "Normal left ventricular function  Moderately elevated right heart pressures with moderate tricuspid valve regurgitation  Given prior PEs, recent COVID, that could explain higher pressures on right  Was hoping they had come down further but they are still elevated  Would consider starting Lasix 20 mg every other day perhaps 3 days a week  Would also take with potassium 10 mill equivalents  BMP in 2 to 3 weeks to check kidneys  Will try to get the pressure down  This can also help with blood pressure and shortness of breath/breathing"  Pt reports she cannot take lasix d/t her "systema dn kidneys", reports her [facility] PCP has her on chlorthalidone 12.5 mg instead. Reports will take to her PCP about the medication and if need to change and stated she won't need K+ pills since she won't be taking lasix, pt also reports she had "all my blood work done yesterday by my doctor". Pt was able to schedule a follow-up with Dr. Rockey Situ on March 15 at 09:40am. She has appt with her PCP MD that following week and will discuss their recommendations and labs with Dr. Rockey Situ on her f/u visit, otherwise all questions or concerns were address and no additional concerns at this time. Agreeable to plan, will call back for anything further.

## 2020-08-31 ENCOUNTER — Other Ambulatory Visit: Payer: Medicare Other

## 2020-09-14 ENCOUNTER — Other Ambulatory Visit: Payer: Self-pay

## 2020-09-14 DIAGNOSIS — I2609 Other pulmonary embolism with acute cor pulmonale: Secondary | ICD-10-CM

## 2020-09-18 ENCOUNTER — Inpatient Hospital Stay: Payer: Medicare Other | Attending: Hematology and Oncology

## 2020-09-18 ENCOUNTER — Other Ambulatory Visit: Payer: Self-pay

## 2020-09-18 ENCOUNTER — Inpatient Hospital Stay: Payer: Medicare Other

## 2020-09-18 DIAGNOSIS — I2609 Other pulmonary embolism with acute cor pulmonale: Secondary | ICD-10-CM

## 2020-09-18 DIAGNOSIS — N189 Chronic kidney disease, unspecified: Secondary | ICD-10-CM | POA: Diagnosis not present

## 2020-09-18 DIAGNOSIS — M069 Rheumatoid arthritis, unspecified: Secondary | ICD-10-CM | POA: Diagnosis not present

## 2020-09-18 DIAGNOSIS — D696 Thrombocytopenia, unspecified: Secondary | ICD-10-CM | POA: Diagnosis not present

## 2020-09-18 DIAGNOSIS — Z801 Family history of malignant neoplasm of trachea, bronchus and lung: Secondary | ICD-10-CM | POA: Diagnosis not present

## 2020-09-18 DIAGNOSIS — E538 Deficiency of other specified B group vitamins: Secondary | ICD-10-CM | POA: Insufficient documentation

## 2020-09-18 DIAGNOSIS — Z96652 Presence of left artificial knee joint: Secondary | ICD-10-CM | POA: Diagnosis not present

## 2020-09-18 DIAGNOSIS — I519 Heart disease, unspecified: Secondary | ICD-10-CM | POA: Diagnosis not present

## 2020-09-18 DIAGNOSIS — Z7901 Long term (current) use of anticoagulants: Secondary | ICD-10-CM | POA: Insufficient documentation

## 2020-09-18 DIAGNOSIS — I2699 Other pulmonary embolism without acute cor pulmonale: Secondary | ICD-10-CM | POA: Insufficient documentation

## 2020-09-18 DIAGNOSIS — I82431 Acute embolism and thrombosis of right popliteal vein: Secondary | ICD-10-CM | POA: Insufficient documentation

## 2020-09-18 DIAGNOSIS — Z79899 Other long term (current) drug therapy: Secondary | ICD-10-CM | POA: Insufficient documentation

## 2020-09-18 DIAGNOSIS — Z803 Family history of malignant neoplasm of breast: Secondary | ICD-10-CM | POA: Insufficient documentation

## 2020-09-18 LAB — CBC WITH DIFFERENTIAL/PLATELET
Abs Immature Granulocytes: 0.01 10*3/uL (ref 0.00–0.07)
Basophils Absolute: 0 10*3/uL (ref 0.0–0.1)
Basophils Relative: 0 %
Eosinophils Absolute: 0.2 10*3/uL (ref 0.0–0.5)
Eosinophils Relative: 3 %
HCT: 35.3 % — ABNORMAL LOW (ref 36.0–46.0)
Hemoglobin: 11.2 g/dL — ABNORMAL LOW (ref 12.0–15.0)
Immature Granulocytes: 0 %
Lymphocytes Relative: 27 %
Lymphs Abs: 1.5 10*3/uL (ref 0.7–4.0)
MCH: 28.9 pg (ref 26.0–34.0)
MCHC: 31.7 g/dL (ref 30.0–36.0)
MCV: 91.2 fL (ref 80.0–100.0)
Monocytes Absolute: 0.4 10*3/uL (ref 0.1–1.0)
Monocytes Relative: 8 %
Neutro Abs: 3.5 10*3/uL (ref 1.7–7.7)
Neutrophils Relative %: 62 %
Platelets: 139 10*3/uL — ABNORMAL LOW (ref 150–400)
RBC: 3.87 MIL/uL (ref 3.87–5.11)
RDW: 17.1 % — ABNORMAL HIGH (ref 11.5–15.5)
WBC: 5.6 10*3/uL (ref 4.0–10.5)
nRBC: 0 % (ref 0.0–0.2)

## 2020-09-18 LAB — COMPREHENSIVE METABOLIC PANEL
ALT: 12 U/L (ref 0–44)
AST: 19 U/L (ref 15–41)
Albumin: 3.8 g/dL (ref 3.5–5.0)
Alkaline Phosphatase: 82 U/L (ref 38–126)
Anion gap: 11 (ref 5–15)
BUN: 27 mg/dL — ABNORMAL HIGH (ref 8–23)
CO2: 26 mmol/L (ref 22–32)
Calcium: 9.1 mg/dL (ref 8.9–10.3)
Chloride: 102 mmol/L (ref 98–111)
Creatinine, Ser: 1.38 mg/dL — ABNORMAL HIGH (ref 0.44–1.00)
GFR, Estimated: 41 mL/min — ABNORMAL LOW (ref >60)
Glucose, Bld: 92 mg/dL (ref 70–99)
Potassium: 4.1 mmol/L (ref 3.5–5.1)
Sodium: 139 mmol/L (ref 135–145)
Total Bilirubin: 0.7 mg/dL (ref 0.3–1.2)
Total Protein: 7 g/dL (ref 6.5–8.1)

## 2020-09-19 LAB — PROTEIN S, TOTAL AND FREE
Protein S Ag, Free: 83 % (ref 61–136)
Protein S Ag, Total: 90 % (ref 60–150)

## 2020-09-19 LAB — PROTEIN S ACTIVITY: Protein S Activity: 62 % — ABNORMAL LOW (ref 63–140)

## 2020-09-20 LAB — PLATELET ANTIBODY PROFILE
Glycoprotein IV Antibody: NEGATIVE
HLA Ab Ser Ql EIA: NEGATIVE
IA/IIA Antibody: NEGATIVE
IB/IX Antibody: NEGATIVE
IIB/IIIA Antibody: NEGATIVE

## 2020-09-21 NOTE — Progress Notes (Signed)
[facility]  [address] [place], [place] [address] Phone: [phone]  Fax: [phone]   Clinic Day:  09/25/2020  Referring physician: Sofie Hartigan, MD  Chief Complaint: Sabrina Green is a 75 y.o. female with a submassive PE and right popliteal DVT (02/2020), anemia and thrombocytopenia who is seen for 3 month assessment.  HPI: The patient was last seen in the hematology clinic on 06/19/2020. At that time, she felt "fine." She reported occasional chest discomfort.  She denied trouble swallowing. She denied leg pain.  She was on Eliquis; she denied any bleeding.  She was off oxygen. Hematocrit was 42.5, hemoglobin 13.2, platelets 127,000, WBC 4,900. Ferritin was 72. Protein S activity was 54%, total 93%, free 64%. Protein C activity was 99%, total 81%. Antithrombin III function was 78%.  The patient tested positive for COVID-19 on 07/19/2020. She received a monoclonal antibody infusion on 07/26/2020.  The patient saw Dr. Rockey Situ on 07/31/2020. She had not had any further episodes of chest pain. Her breathing had improved since her COVID-19 infection. She continued Eliquis 5 mg BID. Echo revealed an EF of 60-65%.  Labs on 09/18/2020 revealed a hematocrit of 35.3, hemoglobin 11.2, MCV 91.2, platelets 139,000, WBC 5,600. Creatinine was 1.38 (CrCl 41 ml/min). Protein S activity was 62%, total 90%, and free 83%. Platelet antibody profile was negative.  During the interim, she has been "alright." She reports joint pain.  Chest discomfort and trouble swallowing have resolved. The patient still takes Eliquis and denies bruising or bleeding. Her mood is "fine."  She had some episodes of vomiting during the interim but figured out that it was caused by drinking too much grapefruit juice.  Colonoscopy is scheduled for 10/06/2020.   Past Medical History:  Diagnosis Date  . Anemia    vitamin b and d deficiency  . Anxiety   . Arthritis    osteoarthrits   . Chronic pain syndrome   . CKD (chronic kidney disease) stage 3, GFR 30-59 ml/min (HCC)   . Complication of anesthesia    difficulty waking up  . DDD (degenerative disc disease), cervical   . Fibromyalgia    not accurate, per patient  . GERD (gastroesophageal reflux disease)   . Gout   . Hearing loss   . History of kidney stones 1987  . Hypertension   . Rheumatoid arthritis (HCC)    orencia infusions  . Vitamin B12 deficiency   . Vitamin D deficiency     Past Surgical History:  Procedure Laterality Date  . ABDOMINAL HYSTERECTOMY    . ABDOMINAL HYSTERECTOMY    . [place], [place].   METAL IN ANKLE  . BREAST BIOPSY Right 02/09/2019   affirm bx of calcs, ribbon marker neg  . CESAREAN SECTION    . CHOLECYSTECTOMY    . EYE SURGERY Bilateral 2015   cataract surgery, [place], [place]  . KNEE ARTHROSCOPY    . PULMONARY THROMBECTOMY N/A 03/10/2020   Procedure: PULMONARY THROMBECTOMY;  Surgeon: Algernon Huxley, MD;  Location: [facility];  Service: Cardiovascular;  Laterality: N/A;  . TONSILLECTOMY    . TOTAL KNEE ARTHROPLASTY Left 09/21/2019   Procedure: TOTAL KNEE ARTHROPLASTY;  Surgeon: Corky Mull, MD;  Location: [facility];  Service: Orthopedics;  Laterality: Left;  . TUBAL LIGATION      Family History  Problem Relation Age of Onset  . Breast cancer Other 50  . Hypertension Mother   .  Migraines Mother   . Other Father   . Lung cancer Sister     Social History:  reports that she has never smoked. She has never used smokeless tobacco. She reports that she does not drink alcohol and does not use drugs. The patient denies alcohol or tobacco use. She denies exposure to radiation or toxins. Her aunt, Valrie Hart, is her medical POA. The patient is retired. The patient is alone today.  Allergies:  Allergies  Allergen Reactions  . Penicillins Hives    Current Medications: Current Outpatient Medications   Medication Sig Dispense Refill  . abatacept (ORENCIA) 250 MG injection Inject into the vein. Once a month    . allopurinol (ZYLOPRIM) 300 MG tablet Take 300 mg by mouth daily. In the morning    . apixaban (ELIQUIS) 5 MG TABS tablet Take 1 tablet (5 mg total) by mouth 2 (two) times daily. 180 tablet 1  . ascorbic acid (VITAMIN C) 500 MG tablet Take 1 tablet by mouth daily.    Marland Kitchen atorvastatin (LIPITOR) 40 MG tablet Take 1 tablet (40 mg total) by mouth daily. 90 tablet 3  . busPIRone (BUSPAR) 15 MG tablet Take 15 mg by mouth daily.    . chlorthalidone (HYGROTON) 25 MG tablet Take 12.5 mg by mouth daily.    . Cyanocobalamin (VITAMIN B-12) 5000 MCG TBDP Take 5,000 mcg by mouth daily.    . ferrous sulfate 325 (65 FE) MG tablet Take 1 tablet by mouth daily.    . fluticasone (FLONASE) 50 MCG/ACT nasal spray Place 2 sprays into both nostrils daily.    . folic acid (FOLVITE) 696 MCG tablet Take 400 mcg by mouth daily.    Marland Kitchen labetalol (NORMODYNE) 100 MG tablet Take 1 tablet (100 mg total) by mouth 2 (two) times daily. 180 tablet 3  . losartan (COZAAR) 25 MG tablet Take 25 mg by mouth daily.    Marland Kitchen lubiprostone (AMITIZA) 24 MCG capsule Take 24 mcg by mouth 2 (two) times daily as needed for constipation.     . magnesium oxide (MAG-OX) 400 MG tablet Take 1 tablet by mouth daily.    . methotrexate 50 MG/2ML injection Inject 12.5 mg into the vein every Thursday. (0.5 ml)    . nortriptyline (PAMELOR) 25 MG capsule Take 25 mg by mouth at bedtime.    Marland Kitchen omeprazole (PRILOSEC) 20 MG capsule Take 20 mg by mouth daily before breakfast.     . tiZANidine (ZANAFLEX) 4 MG capsule Take 4 mg by mouth 2 (two) times daily as needed for muscle spasms.    . Biotin 5000 MCG TABS Take 5,000 mcg by mouth daily. (Patient not taking: Reported on 09/22/2020)    . gabapentin (NEURONTIN) 100 MG capsule Take 1 capsule (100 mg total) by mouth 3 (three) times daily for 14 days. 42 capsule 0  . traMADol (ULTRAM) 50 MG tablet Take 1 tablet (50  mg total) by mouth every 6 (six) hours as needed for moderate pain. (Patient not taking: Reported on 09/22/2020) 30 tablet 0   No current facility-administered medications for this visit.   Review of Systems  Constitutional: Negative for chills, diaphoresis, fever, malaise/fatigue and weight loss (up 2 lbs).       Feels "alright."  HENT: Negative for congestion, ear discharge, ear pain, hearing loss, nosebleeds, sinus pain, sore throat and tinnitus.   Eyes: Negative for blurred vision.  Respiratory: Negative for cough, hemoptysis, sputum production and shortness of breath.   Cardiovascular: Negative for chest pain,  palpitations and leg swelling.  Gastrointestinal: Negative for abdominal pain, blood in stool, constipation, diarrhea, heartburn, melena, nausea and vomiting.  Genitourinary: Negative for dysuria, frequency, hematuria and urgency.  Musculoskeletal: Positive for joint pain (rheumatoid arthritis). Negative for back pain, myalgias and neck pain.  Skin: Negative for itching and rash.  Neurological: Negative for dizziness, tingling, sensory change, weakness and headaches.  Endo/Heme/Allergies: Does not bruise/bleed easily.  Psychiatric/Behavioral: Negative for depression and memory loss. The patient is not nervous/anxious and does not have insomnia.        Mood is "fine."  All other systems reviewed and are negative.  Performance status (ECOG): 0-1  Vitals Blood pressure (!) 144/59, pulse 69, temperature (!) 97 F (36.1 C), temperature source Tympanic, weight 246 lb 14.6 oz (112 kg).   Physical Exam Vitals and nursing note reviewed.  Constitutional:      General: She is not in acute distress.    Appearance: She is not diaphoretic.  HENT:     Head: Normocephalic and atraumatic.     Mouth/Throat:     Mouth: Mucous membranes are moist.     Pharynx: Oropharynx is clear.  Eyes:     General: No scleral icterus.    Extraocular Movements: Extraocular movements intact.      Conjunctiva/sclera: Conjunctivae normal.     Pupils: Pupils are equal, round, and reactive to light.  Cardiovascular:     Rate and Rhythm: Normal rate and regular rhythm.     Heart sounds: Normal heart sounds. No murmur heard.   Pulmonary:     Effort: Pulmonary effort is normal. No respiratory distress.     Breath sounds: Normal breath sounds. No wheezing or rales.  Chest:     Chest wall: No tenderness.  Breasts:     Right: No axillary adenopathy or supraclavicular adenopathy.     Left: No axillary adenopathy or supraclavicular adenopathy.    Abdominal:     General: Bowel sounds are normal. There is no distension.     Palpations: Abdomen is soft. There is no mass.     Tenderness: There is no abdominal tenderness. There is no guarding or rebound.  Musculoskeletal:        General: Tenderness (BLE soreness) present. No swelling. Normal range of motion.     Cervical back: Normal range of motion and neck supple.     Right lower leg: Edema present.     Left lower leg: Edema present.  Lymphadenopathy:     Head:     Right side of head: No preauricular, posterior auricular or occipital adenopathy.     Left side of head: No preauricular, posterior auricular or occipital adenopathy.     Cervical: No cervical adenopathy.     Upper Body:     Right upper body: No supraclavicular or axillary adenopathy.     Left upper body: No supraclavicular or axillary adenopathy.     Lower Body: No right inguinal adenopathy. No left inguinal adenopathy.  Skin:    General: Skin is warm and dry.  Neurological:     Mental Status: She is alert and oriented to person, place, and time.  Psychiatric:        Behavior: Behavior normal.        Thought Content: Thought content normal.        Judgment: Judgment normal.     No visits with results within 3 Day(s) from this visit.  Latest known visit with results is:  Appointment on 09/18/2020  Component Date  Value Ref Range Status  . Protein S Activity  09/18/2020 62* 63 - 140 % Final   Comment: (NOTE) A deficiency of protein S (PS), either congenital or acquired, increases the risk of thromboembolism. PS activity levels may be falsely low in individuals with APCR/Factor V Leiden. Consider performing free protein S antigen in those with APCR/Factor V Leiden before making a diagnosis of protein S deficiency. Acquired PS deficiency is more common than congenital deficiency. PS values decrease with normal pregnancy, and are also dependent on age, sex and hormone status. PS values tend to be lower in a younger age group and lower in women than in men. Levels may be decreased in pre-menopausal women on oral contraceptive agents. Acquired deficiency can occur as a result of vitamin K deficiency or antagonism, severe hepatic disorders, (hepatitis, cirrhosis, etc.), nephrotic syndrome, inflammatory bowel disease, certain chemotherapeutic agents, L-asparaginse therapy, sepsis, disseminated intravascular coagulation (DIC) and acute thrombosis. Levels may be decreased in                           patients with polycythemia vera, sickle cell disease and essential thrombocythemia. Repeat evaluation on a new plasma sample to confirm or refute this result should be considered, after ruling out acquired causes, depending on the clinical scenario. Performed At: [facility] [place], [place] [id number] Rush Farmer MD FV:[id number]   . Protein S Ag, Total 09/18/2020 90  60 - 150 % Final   Comment: (NOTE) This test was developed and its performance characteristics determined by [facility]. It has not been cleared or approved by the Food and Drug Administration.   . Protein S Ag, Free 09/18/2020 83  61 - 136 % Final   Comment: (NOTE) Performed At: [facility] [place], [place] [id number] Rush Farmer MD JT:[id number]   . Sodium 09/18/2020 139  135 - 145 mmol/L Final  . Potassium 09/18/2020 4.1  3.5 -  5.1 mmol/L Final  . Chloride 09/18/2020 102  98 - 111 mmol/L Final  . CO2 09/18/2020 26  22 - 32 mmol/L Final  . Glucose, Bld 09/18/2020 92  70 - 99 mg/dL Final   Glucose reference range applies only to samples taken after fasting for at least 8 hours.  . BUN 09/18/2020 27* 8 - 23 mg/dL Final  . Creatinine, Ser 09/18/2020 1.38* 0.44 - 1.00 mg/dL Final  . Calcium 09/18/2020 9.1  8.9 - 10.3 mg/dL Final  . Total Protein 09/18/2020 7.0  6.5 - 8.1 g/dL Final  . Albumin 09/18/2020 3.8  3.5 - 5.0 g/dL Final  . AST 09/18/2020 19  15 - 41 U/L Final  . ALT 09/18/2020 12  0 - 44 U/L Final  . Alkaline Phosphatase 09/18/2020 82  38 - 126 U/L Final  . Total Bilirubin 09/18/2020 0.7  0.3 - 1.2 mg/dL Final  . GFR, Estimated 09/18/2020 41* >60 mL/min Final   Comment: (NOTE) Calculated using the CKD-EPI Creatinine Equation (2021)   . Anion gap 09/18/2020 11  5 - 15 Final   Performed at [facility], [address] [place], [place] [address]  . WBC 09/18/2020 5.6  4.0 - 10.5 K/uL Final  . RBC 09/18/2020 3.87  3.87 - 5.11 MIL/uL Final  . Hemoglobin 09/18/2020 11.2* 12.0 - 15.0 g/dL Final  . HCT 09/18/2020 35.3* 36.0 - 46.0 % Final  . MCV 09/18/2020 91.2  80.0 - 100.0 fL Final  . [facility] 09/18/2020 28.9  26.0 - 34.0 pg Final  . MCHC 09/18/2020 31.7  30.0 - 36.0 g/dL Final  . RDW 09/18/2020 17.1* 11.5 - 15.5 % Final  . Platelets 09/18/2020 139* 150 - 400 K/uL Final  . nRBC 09/18/2020 0.0  0.0 - 0.2 % Final  . Neutrophils Relative % 09/18/2020 62  % Final  . Neutro Abs 09/18/2020 3.5  1.7 - 7.7 K/uL Final  . Lymphocytes Relative 09/18/2020 27  % Final  . Lymphs Abs 09/18/2020 1.5  0.7 - 4.0 K/uL Final  . Monocytes Relative 09/18/2020 8  % Final  . Monocytes Absolute 09/18/2020 0.4  0.1 - 1.0 K/uL Final  . Eosinophils Relative 09/18/2020 3  % Final  . Eosinophils Absolute 09/18/2020 0.2  0.0 - 0.5 K/uL Final  . Basophils Relative 09/18/2020 0  % Final  . Basophils Absolute 09/18/2020 0.0   0.0 - 0.1 K/uL Final  . Immature Granulocytes 09/18/2020 0  % Final  . Abs Immature Granulocytes 09/18/2020 0.01  0.00 - 0.07 K/uL Final   Performed at [facility], [address] [place],  [address]  . HLA Ab Ser Ql EIA 09/18/2020 Negative  Negative Final  . IIB/IIIA Antibody 09/18/2020 Negative  Negative Final  . IB/IX Antibody 09/18/2020 Negative  Negative Final  . IA/IIA Antibody 09/18/2020 Negative  Negative Final  . Glycoprotein IV Antibody 09/18/2020 Negative  Negative Final   Comment: (NOTE) Performed At: [facility] [place], [place] [id number] Rush Farmer MD XF:[id number]     Assessment:  Sabrina Green is a 75 y.o. female a submassive pulmonary embolism and right popliteal DVT. She underwent pulmonary thrombectomy and catheter directed TPA on 03/10/2020.  She is on Eliquis.  Chest CT angiogram revealed an acute PE with right heart strain (RV/LV Ratio = 1.5) c/w at least submassive (intermediate risk) PE. Bilateral lower extremity venous duplex revealed a right popliteal DVT.   Work-up on 03/28/2020 revealed a hematocrit of 29.9, hemoglobin 9.1, MCV 92.3, platelets 205,000, WBC 5,100. Platelet count in a blue top was 194,000. Creatinine was 1.33 (CrCl 46 ml/min). Ferritin was 160 with an iron saturation of 10% and a TIBC of 358. Reticulocyte count was 4.0%. Vitamin B12 was 1,430 and folate was 15.8. Hepatitis B core antibody, hepatitis C antibody, and HIV antibody were negative. ANA was negative. Factor V Leiden, prothrombin gene mutation, lupus anticoagulant panel, anti-cardiolipin antibodies were negative, and beta2-glycoprotein was negative.  Labs on 06/12/2020 revealed a normal protein S total antigen (93%), protein C total (81%), protein C activity (99%), and antithrombin III activity (78%).  Protein S free antigen was borderline (64%; 61-136%), and protein S activity was low (54%; 63-140%).  Labs on 09/18/2020 revealed a protein S  activity of 62% (slightly low) and a protein S antigen of 90% and free 83%.  She has chronic thrombocytopenia. Platelet count has been followed: 145,000 on 02/26/2016, 169,000 on 06/02/2017, 132,000 on 09/07/2017, 135,000 on 09/23/2019, 102,000 on 03/13/2020, 205,000 on 03/28/2020, 176,000 on 04/24/2020, 123,000 on 06/04/2020, 127,000 on 06/12/2020, and 139,000 on 09/18/2020.  She has B12 deficiency and is on oral B12.  B12 was 1430 on 03/28/2020.  Abdomen and pelvis CT without contrast on 04/07/2020 revealed no pathologically enlarged lymph nodes.  Liver and spleen were normal. There was residual inflammatory ground-glass in the lung bases, related to pulmonary emboli seen on 03/11/2020. There was aortic atherosclerosis and coronary artery calcification.  She has renal insufficiency.  Creatinine was 1.94 (CrCl 32.8 ml/min) on  03/13/2020.  She has rheumatoid arthritis and has received Orencia infusions monthly for about 2 years.  She tested positive for COVID-19 on 07/19/2020. She received a monoclonal antibody infusion on 07/26/2020.  Symptomatically, she feels "alright." She reports joint pain.  Chest discomfort has resolved. She denies bruising or bleeding on Eliquis. Colonoscopy is scheduled for 10/06/2020.  Exam is stable.  Plan: 1.   Review labs from 09/18/2020. 2.   Pulmonary embolism  She has had a submassive pulmonary embolism.  Hypercoagulable work-up revealed a borderline protein S antigen and a low protein S activity.  Abdomen and pelvis CT scan revealed no evidence of malignancy.  Screening mammogram on 04/18/2020 revealed no evidence of malignancy.    Colonoscopy is scheduled for 10/06/2020.   Eliquis will be held 3 days prior to colonoscopy.  Discuss life long anticoagulation. 3.   Thrombocytopenia  Platelet count is 139,000.    Platelet count 102,000 on 03/13/2020, 205,000 on 03/28/2020 and 127,000 on 06/12/2020.  ANA, hepatitis B core antibody, hepatitis C antibody, and  HIV testing was negative.  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  Continue to monitor. 4.   Normocytic anemia, improved  Hematocrit 35.3.  Hemoglobin 11.2.  MCV 91.2 on 09/18/2020.   Ferritin was 72 on 06/12/2020.  SPEP and UPEP in 01/2017 revealed no monoclonal protein.   B12 and folate were normal on 03/28/2020.  Bilirubin 0.5.    Etiology felt secondary to chronic renal insufficiency, improving.   Creatinine 1.94 on 03/13/2020 and 1.38 on 09/18/2020. 5.   RN:  copy of labs. 6.   RTC in 4 months for MD assessment and labs (CBC with diff, IPF, CMP, ferritin, iron studies).  I discussed the assessment and treatment plan with the patient.  The patient was provided an opportunity to ask questions and all were answered.  The patient agreed with the plan and demonstrated an understanding of the instructions.  The patient was advised to call back if the symptoms worsen or if the condition fails to improve as anticipated.  I provided 11 minutes of face-to-face time during this this encounter and > 50% was spent counseling as documented under my assessment and plan.   Melissa C. Mike Gip, MD, PhD 09/25/2020, 10:42 AM  I, Mirian Mo Tufford, am acting as Education administrator for [employer]. Mike Gip, MD, PhD.  I, Melissa C. Mike Gip, MD, have reviewed the above documentation for accuracy and completeness, and I agree with the above.

## 2020-09-22 ENCOUNTER — Encounter: Payer: Self-pay | Admitting: Hematology and Oncology

## 2020-09-25 ENCOUNTER — Inpatient Hospital Stay (HOSPITAL_BASED_OUTPATIENT_CLINIC_OR_DEPARTMENT_OTHER): Payer: Medicare Other | Admitting: Hematology and Oncology

## 2020-09-25 ENCOUNTER — Other Ambulatory Visit: Payer: Self-pay

## 2020-09-25 ENCOUNTER — Encounter: Payer: Self-pay | Admitting: Hematology and Oncology

## 2020-09-25 VITALS — BP 144/59 | HR 69 | Temp 97.0°F | Wt 246.9 lb

## 2020-09-25 DIAGNOSIS — I2782 Chronic pulmonary embolism: Secondary | ICD-10-CM | POA: Diagnosis not present

## 2020-09-25 DIAGNOSIS — I2609 Other pulmonary embolism with acute cor pulmonale: Secondary | ICD-10-CM | POA: Diagnosis not present

## 2020-09-25 DIAGNOSIS — I248 Other forms of acute ischemic heart disease: Secondary | ICD-10-CM

## 2020-09-25 DIAGNOSIS — D649 Anemia, unspecified: Secondary | ICD-10-CM | POA: Diagnosis not present

## 2020-09-25 DIAGNOSIS — D696 Thrombocytopenia, unspecified: Secondary | ICD-10-CM

## 2020-09-25 DIAGNOSIS — I2699 Other pulmonary embolism without acute cor pulmonale: Secondary | ICD-10-CM | POA: Diagnosis not present

## 2020-09-25 NOTE — Patient Instructions (Signed)
-   Continue Eliquis 

## 2020-09-27 ENCOUNTER — Telehealth: Payer: Self-pay

## 2020-10-03 NOTE — Telephone Encounter (Signed)
No message

## 2020-10-07 NOTE — Progress Notes (Signed)
PROVIDER NOTE: Information contained herein reflects review and annotations entered in association with encounter. Interpretation of such information and data should be left to medically-trained personnel. Information provided to patient can be located elsewhere in the medical record under "Patient Instructions". Document created using STT-dictation technology, any transcriptional errors that may result from process are unintentional.    Patient: Sabrina Green  Service Category: E/M  Provider: Gaspar Cola, MD  DOB: [birth year]  DOS: 10/10/2020  Specialty: Interventional Pain Management  MRN: [id number]  Setting: Ambulatory outpatient  PCP: Sofie Hartigan, MD  Type: Established Patient    Referring Provider: Sofie Hartigan, MD  Location: Office  Delivery: Face-to-face     HPI  Ms. Sabrina Green, a 75 y.o. year old female, is here today because of her Chronic pain syndrome [G89.4]. [name] primary complain today is Neck Pain (right) and Leg Pain (Left, upper) Last encounter: My last encounter with her was on 05/11/2020. Pertinent problems: Ms. Labrecque has Shoulder bursitis (Right); Chronic gout; Chronic low back pain (Midline); DDD (degenerative disc disease), lumbar; Fibromyalgia; Rheumatoid arthritis involving multiple sites [facility]); Chronic pain syndrome; Chronic knee pain (Right); Chronic lower extremity pain (3ry area of Pain) (Left); Chronic shoulder pain (4th area of Pain) (Right); Chronic hip pain (Left); Chronic shoulder pain (Bilateral); Osteoarthritis of shoulder (Bilateral); Chronic low back pain (2ry area of Pain) (Bilateral) (L>R); Lumbar facet syndrome (Bilateral) (L>R); Spondylosis without myelopathy or radiculopathy, lumbosacral region; Neurogenic pain; Carpal tunnel syndrome on right; Carpal tunnel syndrome, left; [facility] arthritis; Bursitis of left shoulder; Osteoarthritis involving multiple joints; History of total knee replacement (Left); Tricompartment osteoarthritis  of knee (Right); and Osteoarthritis of knee (Right) on their pertinent problem list. Pain Assessment: Severity of Chronic pain is reported as a 0-No pain/10. Location: Knee Right/denies. Onset: More than a month ago. Quality: Other (Comment) (rubbing). Timing: Intermittent. Modifying factor(s): Biofreezae. Vitals:  height is 5' 5" (1.651 m) and weight is 246 lb (111.6 kg). Her temporal temperature is 97.1 F (36.2 C) (abnormal). Her blood pressure is 142/53 (abnormal) and her pulse is 79. Her respiration is 16 and oxygen saturation is 100%.   Reason for encounter: worsening of previously known (established) problem the patient returns to the clinic today indicating worsening of her right knee pain as well as her left lower extremity and back pain.  In the case of the right lower extremity the patient has a known osteoarthritis which we have previously treated with Hyalgan injections.  On 05/11/2020 she had indicated that she was still having some problems with the right knee after having had only one Hyalgan knee injection and she wanted to go ahead and complete the series.  For some reason, this did not happen.  Today we will go ahead and put in order to complete the series of 5 intra-articular Hyalgan knee injections.  In addition, the patient has been experiencing left lower extremity pain which appears to start in the left lower back and travel through the lateral aspect of the left leg to about the level of the knee.  Hyperextension and rotation maneuver towards the left side was positive for lumbar facet arthralgia with referred pain towards the lateral aspect of the leg going down towards the knee.  The patient refers experiencing some of the pain in the knee region however, on February 2021 she had a total knee replacement on the left side.  The patient also indicates that she has been told that she has a disc herniation  in the lower back, but in looking at MRI studies, I could not find any under the  [facility] system or under "care everywhere".  On 01/05/2018 we had ordered x-rays of the lumbar spine which demonstrated the patient to have some mild convex levoscoliosis with loss of intervertebral disc space at the L4-5 and L5-S1 levels.  There was also evidence of facet joint arthropathy at the L3-4 through L5-S1 levels.  Today I will be ordering an MRI of the lumbar spine however this will have to be done in an open MRI since the patient indicates being claustrophobic and getting panic attacks when going into small confined spaces such as the MRI.  I offered the patient to send a prescription for 1 Valium that she can take before the MRI, but she declined.  Because today's physical exam proved to be positive for left lumbar facet syndrome, I will go ahead and schedule her to return for a left-sided lumbar facet block under fluoroscopic guidance and IV sedation at the same time that we will be also doing the right intra-articular Hyalgan knee injection.  The plan was shared with the patient who understood and accepted.  Pharmacotherapy Assessment   Analgesic: No opioid analgesics prescribed by our practice    Monitoring: [place] PMP: PDMP reviewed during this encounter.       Pharmacotherapy: No side-effects or adverse reactions reported. Compliance: No problems identified. Effectiveness: Clinically acceptable.  Landis Martins, RN  10/10/2020  2:26 PM  Sign when Signing Visit Safety precautions to be maintained throughout the outpatient stay will include: orient to surroundings, keep bed in low position, maintain call bell within reach at all times, provide assistance with transfer out of bed and ambulation.     UDS:  Summary  Date Value Ref Range Status  12/11/2017 FINAL  Final    Comment:    ==================================================================== TOXASSURE COMP DRUG ANALYSIS,UR ==================================================================== Test                             Result        Flag       Units Drug Present and Declared for Prescription Verification   Gabapentin                     PRESENT      EXPECTED   Propranolol                    PRESENT      EXPECTED Drug Present not Declared for Prescription Verification   Acetaminophen                  PRESENT      UNEXPECTED   Diphenhydramine                PRESENT      UNEXPECTED Drug Absent but Declared for Prescription Verification   Tizanidine                     Not Detected UNEXPECTED    Tizanidine, as indicated in the declared medication list, is not    always detected even when used as directed.   Sertraline                     Not Detected UNEXPECTED   Promethazine                   Not  Detected UNEXPECTED ==================================================================== Test                      Result    Flag   Units      Ref Range   Creatinine              117              mg/dL      >=20 ==================================================================== Declared Medications:  The flagging and interpretation on this report are based on the  following declared medications.  Unexpected results may arise from  inaccuracies in the declared medications.  **Note: The testing scope of this panel includes these medications:  Gabapentin (Neurontin)  Promethazine (Phenergan)  Propranolol (Inderal)  Sertraline (Zoloft)  **Note: The testing scope of this panel does not include small to  moderate amounts of these reported medications:  Tizanidine (Zanaflex)  **Note: The testing scope of this panel does not include following  reported medications:  Allopurinol  Calcium (Calcium/Vitamin D)  Doxycycline (Vibramycin)  Estradiol (Estrace)  Fluticasone (Flonase)  Furosemide (Lasix)  Losartan (Cozaar)  Lubiprostone (Amitiza)  Methotrexate (Rheumatrex)  Mupirocin (Bactroban)  Omeprazole (Prilosec)  Vitamin D  Vitamin D (Calcium/Vitamin  D) ==================================================================== For clinical consultation, please call [phone]. ====================================================================      ROS  Constitutional: Denies any fever or chills Gastrointestinal: No reported hemesis, hematochezia, vomiting, or acute GI distress Musculoskeletal: Denies any acute onset joint swelling, redness, loss of ROM, or weakness Neurological: No reported episodes of acute onset apraxia, aphasia, dysarthria, agnosia, amnesia, paralysis, loss of coordination, or loss of consciousness  Medication Review  Cholecalciferol, HYDROcodone-acetaminophen, Vitamin B-12, abatacept, allopurinol, apixaban, ascorbic acid, atomoxetine, atorvastatin, busPIRone, chlorthalidone, fluticasone, folic acid, gabapentin, hydrocortisone, labetalol, losartan, lubiprostone, methotrexate, nortriptyline, omeprazole, oxyCODONE, pantoprazole, and tiZANidine  History Review  Allergy: Ms. Mutz is allergic to penicillins. Drug: Ms. Bruinsma  reports no history of drug use. Alcohol:  reports no history of alcohol use. Tobacco:  reports that she has never smoked. She has never used smokeless tobacco. Social: Ms. Peden  reports that she has never smoked. She has never used smokeless tobacco. She reports that she does not drink alcohol and does not use drugs. Medical:  has a past medical history of Anemia, Anxiety, Arthritis, Chronic pain syndrome, CKD (chronic kidney disease) stage 3, GFR 30-59 ml/min (HCC), Complication of anesthesia, DDD (degenerative disc disease), cervical, Fibromyalgia, GERD (gastroesophageal reflux disease), Gout, Hearing loss, History of kidney stones (1987), Hypertension, Rheumatoid arthritis ([place]), Vitamin B12 deficiency, and Vitamin D deficiency. Surgical: Ms. Deist  has a past surgical history that includes Tonsillectomy; Cesarean section; Tubal ligation; Abdominal hysterectomy; Knee arthroscopy;  Cholecystectomy; Abdominal hysterectomy; Ankle fracture surgery (Right, 1989); Eye surgery (Bilateral, 2015); Total knee arthroplasty (Left, 09/21/2019); PULMONARY THROMBECTOMY (N/A, 03/10/2020); and Breast biopsy (Right, 02/09/2019). Family: family history includes Breast cancer (age of onset: 18) in an other family member; Hypertension in her mother; Lung cancer in her sister; Migraines in her mother; Other in her father.  Laboratory Chemistry Profile   Renal Lab Results  Component Value Date   BUN 27 (H) 09/18/2020   CREATININE 1.38 (H) 09/18/2020   BCR 16 12/11/2017   GFRAA 46 (L) 03/28/2020   GFRNONAA 41 (L) 09/18/2020     Hepatic Lab Results  Component Value Date   AST 19 09/18/2020   ALT 12 09/18/2020   ALBUMIN 3.8 09/18/2020   ALKPHOS 82 09/18/2020   HCVAB NON REACTIVE 03/28/2020     Electrolytes Lab  Results  Component Value Date   NA 139 09/18/2020   K 4.1 09/18/2020   CL 102 09/18/2020   CALCIUM 9.1 09/18/2020   MG 1.6 (L) 03/13/2020     Bone Lab Results  Component Value Date   25OHVITD1 46 12/11/2017   25OHVITD2 <1.0 12/11/2017   25OHVITD3 46 12/11/2017     Inflammation (CRP: Acute Phase) (ESR: Chronic Phase) Lab Results  Component Value Date   CRP 19.5 (H) 12/11/2017   ESRSEDRATE 37 (H) 04/24/2020       Note: Above Lab results reviewed.  Recent Imaging Review  ECHOCARDIOGRAM COMPLETE    ECHOCARDIOGRAM REPORT       Patient Name:   Lee-Ann ANN Kensinger Date of Exam: 08/18/2020 Medical Rec #:  [id number]        Height:       65.0 in Accession #:    [id number]       Weight:       245.0 lb Date of Birth:  [birth year]       BSA:          2.156 m Patient Age:    75 years         BP:           138/68 mmHg Patient Gender: F                HR:           66 bpm. Exam Location:  [facility]  Procedure: 2D Echo, Cardiac Doppler and Color Doppler  Indications:    I26.99 Pulmonary embolism   History:        Patient has prior history of Echocardiogram  examinations, most                 recent 03/09/2020. Pulmonary HTN, Arrythmias:Long QT; Risk                 Factors:Non-Smoker and Hypertension.   Sonographer:    Pilar Jarvis RDMS, RVT, RDCS Referring Phys: [place] Comments: Two unsuccessful IV attempts. Apex not seen well in apical images IMPRESSIONS   1. Left ventricular ejection fraction, by estimation, is 60 to 65%. The left ventricle has normal function. The left ventricle has no regional wall motion abnormalities. There is mild left ventricular hypertrophy. Left ventricular diastolic parameters  are consistent with Grade I diastolic dysfunction (impaired relaxation).  2. Right ventricular systolic function is normal. The right ventricular size is normal. There is moderately elevated pulmonary artery systolic pressure. The estimated right ventricular systolic pressure is 94.4 mmHg.  3. Left atrial size was moderately dilated.  4. Mild mitral valve regurgitation.  5. Tricuspid valve regurgitation is moderate.  6. Aortic valve regurgitation is mild.  FINDINGS  Left Ventricle: Left ventricular ejection fraction, by estimation, is 60 to 65%. The left ventricle has normal function. The left ventricle has no regional wall motion abnormalities. The left ventricular internal cavity size was normal in size. There is  mild left ventricular hypertrophy. Left ventricular diastolic parameters are consistent with Grade I diastolic dysfunction (impaired relaxation).  Right Ventricle: The right ventricular size is normal. No increase in right ventricular wall thickness. Right ventricular systolic function is normal. There is moderately elevated pulmonary artery systolic pressure. The tricuspid regurgitant velocity is  3.26 m/s, and with an assumed right atrial pressure of 5 mmHg, the estimated right ventricular systolic pressure is 96.7 mmHg.  Left Atrium: Left atrial size was moderately dilated.  Right Atrium: Right  atrial size was normal in size.  Pericardium: There is no evidence of pericardial effusion.  Mitral Valve: The mitral valve is normal in structure. Mild mitral valve regurgitation. No evidence of mitral valve stenosis.  Tricuspid Valve: The tricuspid valve is normal in structure. Tricuspid valve regurgitation is moderate . No evidence of tricuspid stenosis.  Aortic Valve: The aortic valve is normal in structure. Aortic valve regurgitation is mild. Aortic regurgitation PHT measures 308 msec. No aortic stenosis is present. Aortic valve mean gradient measures 4.0 mmHg. Aortic valve peak gradient measures 7.1  mmHg. Aortic valve area, by VTI measures 2.85 cm.  Pulmonic Valve: The pulmonic valve was normal in structure. Pulmonic valve regurgitation is not visualized. No evidence of pulmonic stenosis.  Aorta: The aortic root is normal in size and structure.  Venous: The inferior vena cava is normal in size with greater than 50% respiratory variability, suggesting right atrial pressure of 3 mmHg.  IAS/Shunts: No atrial level shunt detected by color flow Doppler.    LEFT VENTRICLE PLAX 2D LVIDd:         4.00 cm  Diastology LVIDs:         3.00 cm  LV e' medial:    6.85 cm/s LV PW:         1.00 cm  LV E/e' medial:  11.5 LV IVS:        1.10 cm  LV e' lateral:   5.44 cm/s LVOT diam:     2.10 cm  LV E/e' lateral: 14.5 LV SV:         85 LV SV Index:   39 LVOT Area:     3.46 cm    RIGHT VENTRICLE             IVC RV Basal diam:  3.40 cm     IVC diam: 1.20 cm RV S prime:     12.70 cm/s TAPSE (M-mode): 2.6 cm  LEFT ATRIUM             Index LA diam:        3.50 cm 1.62 cm/m LA Vol (A2C):   64.8 ml 30.05 ml/m LA Vol (A4C):   81.3 ml 37.70 ml/m LA Biplane Vol: 74.3 ml 34.46 ml/m  AORTIC VALVE                   PULMONIC VALVE AV Area (Vmax):    2.41 cm    PV Vmax:       0.70 m/s AV Area (Vmean):   2.29 cm    PV Peak grad:  1.9 mmHg AV Area (VTI):     2.85 cm AV Vmax:           133.00  cm/s AV Vmean:          92.100 cm/s AV VTI:            0.297 m AV Peak Grad:      7.1 mmHg AV Mean Grad:      4.0 mmHg LVOT Vmax:         92.70 cm/s LVOT Vmean:        61.000 cm/s LVOT VTI:          0.244 m LVOT/AV VTI ratio: 0.82 AI PHT:            308 msec   AORTA Ao Root diam: 3.00 cm Ao Asc diam:  2.90 cm Ao Arch diam: 2.9 cm  MITRAL VALVE  TRICUSPID VALVE MV Area (PHT): 3.31 cm     TR Peak grad:   42.5 mmHg MV Decel Time: 229 msec     TR Vmax:        326.00 cm/s MV E velocity: 78.80 cm/s MV A velocity: 119.00 cm/s  SHUNTS MV E/A ratio:  0.66         Systemic VTI:  0.24 m                             Systemic Diam: 2.10 cm  Ida Rogue MD Electronically signed by Ida Rogue MD Signature Date/Time: 08/18/2020/7:01:37 PM      Final   Note: Reviewed        Physical Exam  General appearance: Well nourished, well developed, and well hydrated. In no apparent acute distress Mental status: Alert, oriented x 3 (person, place, & time)       Respiratory: No evidence of acute respiratory distress Eyes: PERLA Vitals: BP (!) 142/53   Pulse 79   Temp (!) 97.1 F (36.2 C) (Temporal)   Resp 16   Ht 5' 5" (1.651 m)   Wt 246 lb (111.6 kg)   SpO2 100%   BMI 40.94 kg/m  BMI: Estimated body mass index is 40.94 kg/m as calculated from the following:   Height as of this encounter: 5' 5" (1.651 m).   Weight as of this encounter: 246 lb (111.6 kg). Ideal: Ideal body weight: 57 kg (125 lb 10.6 oz) Adjusted ideal body weight: 78.8 kg (173 lb 12.8 oz)  Assessment   Status Diagnosis  Controlled Controlled Controlled 1. Chronic pain syndrome   2. Chronic shoulder pain (4th area of Pain) (Right)   3. Chronic anticoagulation (Eliquis)   4. Chronic knee pain (Right)   5. Osteoarthritis of knee (Right)   6. Chronic low back pain (2ry area of Pain) (Bilateral) (L>R)   7. Lumbar facet syndrome (Bilateral) (L>R)   8. Other intervertebral disc degeneration, lumbar  region   9. Chronic lower extremity pain (3ry area of Pain) (Left)   10. Chronic low back pain (Midline)   11. DDD (degenerative disc disease), lumbar      Updated Problems: Problem  Chronic low back pain (2ry area of Pain) (Bilateral) (L>R)  Chronic lower extremity pain (3ry area of Pain) (Left)  Chronic shoulder pain (4th area of Pain) (Right)  Personal History of Other Mental and Behavioral Disorders    Plan of Care  Problem-specific:  No problem-specific Assessment & Plan notes found for this encounter.  Ms. Gia Lusher has a current medication list which includes the following long-term medication(s): allopurinol, apixaban, atomoxetine, atorvastatin, fluticasone, gabapentin, labetalol, nortriptyline, and omeprazole.  Pharmacotherapy (Medications Ordered): No orders of the defined types were placed in this encounter.  Orders:  Orders Placed This Encounter  Procedures  . KNEE INJECTION    Hyalgan knee injection. Please order Hyalgan.    Standing Status:   Future    Standing Expiration Date:   11/10/2020    Scheduling Instructions:     Procedure: Intra-articular Hyalgan Knee injection #1     Side: Right-sided     Sedation: None     Timeframe: ASAA    Order Specific Question:   Where will this procedure be performed?    Answer:   [facility]  . LUMBAR FACET(MEDIAL BRANCH NERVE BLOCK) MBNB    Standing Status:   Future    Standing Expiration  Date:   11/10/2020    Scheduling Instructions:     Procedure: Lumbar facet block (AKA.: Lumbosacral medial branch nerve block)     Side: Left-sided     Level: L3-4, L4-5, & L5-S1 Facets (L2, L3, L4, L5, & S1 Medial Branch Nerves)     Sedation: With Sedation.     Timeframe: ASAA    Order Specific Question:   Where will this procedure be performed?    Answer:   [facility]  . MR LUMBAR SPINE WO CONTRAST    Patient presents with axial pain with possible radicular component.  In addition to any acute findings,  please report on:  1. Facet (Zygapophyseal) joint DJD (Hypertrophy, space narrowing, subchondral sclerosis, and/or osteophyte formation) 2. DDD and/or IVDD (Loss of disc height, desiccation or "Black disc disease") 3. Pars defects 4. Spondylolisthesis, spondylosis, and/or spondyloarthropathies (include Degree/Grade of displacement in mm) 5. Vertebral body Fractures, including age (old, new/acute) 26. Modic Type Changes 7. Demineralization 8. Bone pathology 9. Central, Lateral Recess, and/or Foraminal Stenosis (include AP diameter of stenosis in mm) 10. Surgical changes (hardware type, status, and presence of fibrosis)  NOTE: Please specify level(s) and laterality.    Standing Status:   Future    Standing Expiration Date:   11/10/2020    Scheduling Instructions:     Imaging must be done as soon as possible. Inform patient that order will expire within 30 days and I will not renew it.    Order Specific Question:   What is the patient's sedation requirement?    Answer:   No Sedation    Order Specific Question:   Does the patient have a pacemaker or implanted devices?    Answer:   No    Order Specific Question:   Preferred imaging location?    Answer:   [facility] (table limit-350lbs)    Order Specific Question:   Call Results- Best Contact Number?    Answer:   [phone] ([facility])    Order Specific Question:   Radiology Contrast Protocol - do NOT remove file path    Answer:   _0 [email or web address]  . Blood Thinner Instructions to Nursing    Always make sure patient has clearance from prescribing physician to stop blood thinners for interventional therapies. If the patient requires a Lovenox-bridge therapy, make sure arrangements are made to institute it with the assistance of the PCP.    Scheduling Instructions:     Have Ms. Ballard stop the Eliquis (Apixaban) x 3 days prior to procedure or surgery.   Follow-up plan:   Return for Procedure (w/  sedation): (L) L-FCT Blk + (R) Knee Hyalgan inj., (Blood Thinner Protocol).      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  NO RFA until BMI <35. 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  Refers needing an open MRI. History of generalized anxiety and panic attacks.   Planned  Pending:   Pending further evaluation   Under consideration:   Diagnosticbilateral genicular nerve block #1 Possible bilateral genicular nerve RFA Diagnosticleft side LESI Diagnostic bilateral lumbar facet RFA Diagnosticright suprascapular NB Possible right suprascapular RFA   Completed:   Palliative bilateral IA steroid knee injection x2(06/01/2019) Diagnosticbilateral lumbar facet MBB x1(02/17/2018)  Therapeutic bilateral Hyalgan knee inj. x6 (08/06/2018) (fluoroscopy required)  Diagnostic bilateral glenohumeral Joint (shoulder), AC joint, & subdeltoid bursaInjectionx1(08/20/2018)    Therapeutic  Palliative (PRN) options:   Palliative bilateral IA steroid knee injection #  3 Diagnosticbilateral lumbar facet nerve block #2 Therapeutic bilateral Hyalgan knee inj. #Y8X4 (last done 09/09/2019) (fluoroscopy required) Diagnostic bilateral glenohumeral Joint (shoulder), AC joint, & subdeltoid bursaInjection#2    Recent Visits No visits were found meeting these conditions. Showing recent visits within past 90 days and meeting all other requirements Today's Visits Date Type Provider Dept  10/10/20 Office Visit Milinda Pointer, MD [facility]  Showing today's visits and meeting all other requirements Future Appointments Date Type Provider Dept  10/17/20 Appointment Milinda Pointer, MD [facility]  Showing future appointments within next 90 days and meeting all other requirements  I discussed the assessment and treatment plan with the patient. The patient was provided an opportunity to ask questions and all  were answered. The patient agreed with the plan and demonstrated an understanding of the instructions.  Patient advised to call back or seek an in-person evaluation if the symptoms or condition worsens.  Duration of encounter: 30 minutes.  Note by: Gaspar Cola, MD Date: 10/10/2020; Time: 3:50 PM

## 2020-10-09 NOTE — Progress Notes (Signed)
Cardiology Office Note  Date:  10/10/2020   ID:  Sabrina Green, Sabrina Green Dec 09, 1945, MRN [id number]  PCP:  Sofie Hartigan, MD   Chief Complaint  Patient presents with  . Follow-up    Review echo results and med reconciliation.     HPI:  Sabrina Green is a 75 y.o. female with a hx of  PE/DVT with right heart strain s/p pulmonary thrombectomy and TPA 02/2020, CKD III,  RA,  anemia of chronic disease,  fibromyalgia,  Covid: 07/19/20 HTN, HLD, DDD, chronic back pain, OA, obesity  In follow-up today she denies any significant shortness of breath Still some swelling worse on the right than the left in the legs Does not wear compression hose No regular walking program, weight running high  Recent studies reviewed with her Echocardiogram Normal left ventricular function Moderately elevated right heart pressures with moderate tricuspid valve regurgitation Given prior PEs, recent COVID, that could explain higher pressures on right  We recommended that she start Lasix based on her echocardiogram, reports that she only takes little bit of chlorthalidone Does not want Lasix given prior history of renal dysfunction, she is followed by nephrology They told her to take little bit of thiazide diuretic  BMP  CR 1.38, BUN 27  Back and leg pain, muscle tight Sedentary: Followed by pain management  "lasix is too hard on kidney", Wants to stay on chlorthalidone  Other past medical hx  left TKA in 08/2019 which was felt to be possibly contributory to DVT/PE.   [facility] 03/08/20-03/13/20 with dizziness, generalized malaise, fatigue, dyspnea. HS-troponin peaked at 714.  EKG with NSR and inferior and anterolateral TWI.  Echo with EF 55-60%, no RWMA, gr1DD, mildly reduced RVSF with severely enlraged RV cavity size and severely elevated PASP estimated at 64.15mmHg, moderately dilated RA, mild TR.  D-dimer >7,500.   Chest CTA with bilateral large PE involving central pulmonary arteries  extending into lobar branches.   Bilateral LE U/S with R popliteal DVT.  Underwent thrombolysis with TPA right pulmonary artery, left pulmonary artery, mechanical thrombectomy left upper lobe and left lower lobe pulmonary arteries also with thrombectomy right middle lobe and right lower lobe   Non contrast CT consistent with pulmonary hemorrhage.   She was discharged on supplemental oxygen recommended to f/u with PCP, hematology, cardiology.    PMH:   has a past medical history of Anemia, Anxiety, Arthritis, Chronic pain syndrome, CKD (chronic kidney disease) stage 3, GFR 30-59 ml/min (HCC), Complication of anesthesia, DDD (degenerative disc disease), cervical, Fibromyalgia, GERD (gastroesophageal reflux disease), Gout, Hearing loss, History of kidney stones (1987), Hypertension, Rheumatoid arthritis ([place]), Vitamin B12 deficiency, and Vitamin D deficiency.  PSH:    Past Surgical History:  Procedure Laterality Date  . ABDOMINAL HYSTERECTOMY    . ABDOMINAL HYSTERECTOMY    . [place], [place].   METAL IN ANKLE  . BREAST BIOPSY Right 02/09/2019   affirm bx of calcs, ribbon marker neg  . CESAREAN SECTION    . CHOLECYSTECTOMY    . EYE SURGERY Bilateral 2015   cataract surgery, [place], [place]  . KNEE ARTHROSCOPY    . PULMONARY THROMBECTOMY N/A 03/10/2020   Procedure: PULMONARY THROMBECTOMY;  Surgeon: Algernon Huxley, MD;  Location: [facility];  Service: Cardiovascular;  Laterality: N/A;  . TONSILLECTOMY    . TOTAL KNEE ARTHROPLASTY Left 09/21/2019   Procedure: TOTAL KNEE ARTHROPLASTY;  Surgeon: Corky Mull, MD;  Location: [facility]  [facility];  Service: Orthopedics;  Laterality: Left;  . TUBAL LIGATION      Current Outpatient Medications  Medication Sig Dispense Refill  . abatacept (ORENCIA) 250 MG injection Inject into the vein. Once a month    . allopurinol (ZYLOPRIM) 300 MG tablet Take 300 mg by mouth daily. In the morning    .  apixaban (ELIQUIS) 5 MG TABS tablet Take 1 tablet (5 mg total) by mouth 2 (two) times daily. 180 tablet 1  . ascorbic acid (VITAMIN C) 500 MG tablet Take 1 tablet by mouth daily.    Marland Kitchen atomoxetine (STRATTERA) 60 MG capsule Take 60 mg by mouth every morning.    Marland Kitchen atorvastatin (LIPITOR) 40 MG tablet Take 1 tablet (40 mg total) by mouth daily. 90 tablet 3  . busPIRone (BUSPAR) 15 MG tablet Take 15 mg by mouth daily.    . chlorthalidone (HYGROTON) 25 MG tablet Take 12.5 mg by mouth daily.    . Cholecalciferol 25 MCG (1000 UT) tablet Take by mouth.    . Cyanocobalamin (VITAMIN B-12) 5000 MCG TBDP Take 5,000 mcg by mouth daily.    . fluticasone (FLONASE) 50 MCG/ACT nasal spray Place 2 sprays into both nostrils daily.    . folic acid (FOLVITE) 409 MCG tablet Take 400 mcg by mouth daily.    Marland Kitchen gabapentin (NEURONTIN) 300 MG capsule 1 capsule 3 TIMES DAILY (route: oral)    . HYDROcodone-acetaminophen (NORCO/VICODIN) 5-325 MG tablet Per instructions TWICE A DAY AS NEEDED  (route: oral)    . hydrocortisone 2.5 % cream Apply topically.    Marland Kitchen labetalol (NORMODYNE) 100 MG tablet Take 1 tablet (100 mg total) by mouth 2 (two) times daily. 180 tablet 3  . losartan (COZAAR) 25 MG tablet Take 25 mg by mouth daily.    Marland Kitchen lubiprostone (AMITIZA) 24 MCG capsule Take 24 mcg by mouth 2 (two) times daily as needed for constipation.     . methotrexate 250 MG/10ML injection Take 0.5  ml l SQ once a week x 12  Weeks    . methotrexate 50 MG/2ML injection Inject 12.5 mg into the vein every Thursday. (0.5 ml)    . nortriptyline (PAMELOR) 25 MG capsule Take 25 mg by mouth at bedtime.    Marland Kitchen omeprazole (PRILOSEC) 20 MG capsule Take 20 mg by mouth daily before breakfast.     . oxyCODONE (OXY IR/ROXICODONE) 5 MG immediate release tablet 2 tablet EVERY 4 HOURS (route: oral)    . pantoprazole (PROTONIX) 40 MG tablet Take by mouth.    Marland Kitchen tiZANidine (ZANAFLEX) 4 MG capsule Take 4 mg by mouth 2 (two) times daily as needed for muscle spasms.      No current facility-administered medications for this visit.     Allergies:   Penicillins   Social History:  The patient  reports that she has never smoked. She has never used smokeless tobacco. She reports that she does not drink alcohol and does not use drugs.   Family History:   family history includes Breast cancer (age of onset: 71) in an other family member; Hypertension in her mother; Lung cancer in her sister; Migraines in her mother; Other in her father.    Review of Systems: Review of Systems  Constitutional: Negative.   HENT: Negative.   Respiratory: Negative.   Cardiovascular: Negative.   Gastrointestinal: Negative.   Musculoskeletal: Negative.   Neurological: Negative.   Psychiatric/Behavioral: Negative.   All other systems reviewed and are negative.   PHYSICAL EXAM: VS:  BP 138/64 (BP Location: Left Arm, Patient Position: Sitting, Cuff Size: Large)   Pulse 72   Ht 5\' 5"  (1.651 m)   Wt 246 lb (111.6 kg)   SpO2 93%   BMI 40.94 kg/m  , BMI Body mass index is 40.94 kg/m. Constitutional:  oriented to person, place, and time. No distress.  HENT:  Head: Grossly normal Eyes:  no discharge. No scleral icterus.  Neck: No JVD, no carotid bruits  Cardiovascular: Regular rate and rhythm, no murmurs appreciated Pulmonary/Chest: Clear to auscultation bilaterally, no wheezes or rails Abdominal: Soft.  no distension.  no tenderness.  Musculoskeletal: Normal range of motion Neurological:  normal muscle tone. Coordination normal. No atrophy Skin: Skin warm and dry Psychiatric: normal affect, pleasant   Recent Labs: 03/13/2020: Magnesium 1.6 09/18/2020: ALT 12; BUN 27; Creatinine, Ser 1.38; Hemoglobin 11.2; Platelets 139; Potassium 4.1; Sodium 139    Lipid Panel Lab Results  Component Value Date   CHOL 183 03/09/2020   HDL 46 03/09/2020   LDLCALC 121 (H) 03/09/2020   TRIG 80 03/09/2020      Wt Readings from Last 3 Encounters:  10/10/20 246 lb (111.6 kg)   09/25/20 246 lb 14.6 oz (112 kg)  07/31/20 245 lb (111.1 kg)      ASSESSMENT AND PLAN:  Problem List Items Addressed This Visit      Cardiology Problems   Essential hypertension     Other   CKD (chronic kidney disease) stage 3, GFR 30-59 ml/min    Other Visit Diagnoses    Acute pulmonary embolism without acute cor pulmonale, unspecified pulmonary embolism type ([place])    -  Primary   Hyperlipidemia LDL goal <70       Demand ischemia ([place])       Chronic respiratory failure with hypoxia (HCC)          Atypical chest pain  No further workup  PVC - Asymptomatic  Bilateral PE s/p thrombolysis/right popliteal DVT -  in setting of left TKA in February 2021.  On Eliquis 5 twice daily Was "told by oncology" to stay on full dosing  Coronary artery calcification - Continue statin, beta-blocker.   No aspirin secondary to chronic anticoagulation. We will continue to monitor cholesterol level  Chronic hypoxic respiratory failure  -in setting of PE.  Recent Covid, reports breathing is improved  CKDIII -  Avoid nephrotoxic agents.  Continue to follow with nephrology.  HTN -  BP well controlled.   HLD  On statin   Total encounter time more than 25 minutes  Greater than 50% was spent in counseling and coordination of care with the patient    Signed, Esmond Plants, M.D., Ph.D. [place], [place]

## 2020-10-10 ENCOUNTER — Ambulatory Visit (INDEPENDENT_AMBULATORY_CARE_PROVIDER_SITE_OTHER): Payer: Medicare Other | Admitting: Cardiovascular Disease

## 2020-10-10 ENCOUNTER — Encounter: Payer: Self-pay | Admitting: Cardiovascular Disease

## 2020-10-10 ENCOUNTER — Ambulatory Visit: Payer: Medicare Other | Attending: Pain Medicine | Admitting: Pain Medicine

## 2020-10-10 ENCOUNTER — Other Ambulatory Visit: Payer: Self-pay

## 2020-10-10 ENCOUNTER — Encounter: Payer: Self-pay | Admitting: Pain Medicine

## 2020-10-10 VITALS — BP 138/64 | HR 72 | Ht 65.0 in | Wt 246.0 lb

## 2020-10-10 VITALS — BP 142/53 | HR 79 | Temp 97.1°F | Resp 16 | Ht 65.0 in | Wt 246.0 lb

## 2020-10-10 DIAGNOSIS — M5136 Other intervertebral disc degeneration, lumbar region: Secondary | ICD-10-CM | POA: Insufficient documentation

## 2020-10-10 DIAGNOSIS — M79605 Pain in left leg: Secondary | ICD-10-CM | POA: Insufficient documentation

## 2020-10-10 DIAGNOSIS — I2699 Other pulmonary embolism without acute cor pulmonale: Secondary | ICD-10-CM

## 2020-10-10 DIAGNOSIS — M25561 Pain in right knee: Secondary | ICD-10-CM | POA: Insufficient documentation

## 2020-10-10 DIAGNOSIS — M545 Low back pain, unspecified: Secondary | ICD-10-CM | POA: Insufficient documentation

## 2020-10-10 DIAGNOSIS — I248 Other forms of acute ischemic heart disease: Secondary | ICD-10-CM

## 2020-10-10 DIAGNOSIS — I10 Essential (primary) hypertension: Secondary | ICD-10-CM

## 2020-10-10 DIAGNOSIS — G8929 Other chronic pain: Secondary | ICD-10-CM | POA: Insufficient documentation

## 2020-10-10 DIAGNOSIS — E785 Hyperlipidemia, unspecified: Secondary | ICD-10-CM | POA: Diagnosis not present

## 2020-10-10 DIAGNOSIS — J9611 Chronic respiratory failure with hypoxia: Secondary | ICD-10-CM

## 2020-10-10 DIAGNOSIS — G894 Chronic pain syndrome: Secondary | ICD-10-CM | POA: Insufficient documentation

## 2020-10-10 DIAGNOSIS — N183 Chronic kidney disease, stage 3 unspecified: Secondary | ICD-10-CM | POA: Diagnosis not present

## 2020-10-10 DIAGNOSIS — M25511 Pain in right shoulder: Secondary | ICD-10-CM | POA: Diagnosis present

## 2020-10-10 DIAGNOSIS — M47816 Spondylosis without myelopathy or radiculopathy, lumbar region: Secondary | ICD-10-CM | POA: Diagnosis present

## 2020-10-10 DIAGNOSIS — Z7901 Long term (current) use of anticoagulants: Secondary | ICD-10-CM | POA: Diagnosis present

## 2020-10-10 DIAGNOSIS — I2489 Other forms of acute ischemic heart disease: Secondary | ICD-10-CM

## 2020-10-10 DIAGNOSIS — M1711 Unilateral primary osteoarthritis, right knee: Secondary | ICD-10-CM | POA: Insufficient documentation

## 2020-10-10 NOTE — Patient Instructions (Signed)
Medication Instructions:  No changes  If you need a refill on your cardiac medications before your next appointment, please call your pharmacy.    Lab work: No new labs needed   If you have labs (blood work) drawn today and your tests are completely normal, you will receive your results only by: . MyChart Message (if you have MyChart) OR . A paper copy in the mail If you have any lab test that is abnormal or we need to change your treatment, we will call you to review the results.   Testing/Procedures: No new testing needed   Follow-Up: At [facility], you and your health needs are our priority.  As part of our continuing mission to provide you with exceptional heart care, we have created designated Provider Care Teams.  These Care Teams include your primary Cardiologist (physician) and Advanced Practice Providers (APPs -  Physician Assistants and Nurse Practitioners) who all work together to provide you with the care you need, when you need it.  . You will need a follow up appointment in 12 months  . Providers on your designated Care Team:   . Christopher Berge, NP . Ryan Dunn, PA-C . Jacquelyn Visser, PA-C  Any Other Special Instructions Will Be Listed Below (If Applicable).  COVID-19 Vaccine Information can be found at: [email or web address] For questions related to vaccine distribution or appointments, please email [email or web address] or call [phone].     

## 2020-10-10 NOTE — Progress Notes (Signed)
Safety precautions to be maintained throughout the outpatient stay will include: orient to surroundings, keep bed in low position, maintain call bell within reach at all times, provide assistance with transfer out of bed and ambulation.  

## 2020-10-10 NOTE — Patient Instructions (Addendum)
____________________________________________________________________________________________  Preparing for Procedure with Sedation  Procedure appointments are limited to planned procedures: . No Prescription Refills. . No disability issues will be discussed. . No medication changes will be discussed.  Instructions: . Oral Intake: Do not eat or drink anything for at least 8 hours prior to your procedure. (Exception: Blood Pressure Medication. See below.) . Transportation: Unless otherwise stated by your physician, you may drive yourself after the procedure. . Blood Pressure Medicine: Do not forget to take your blood pressure medicine with a sip of water the morning of the procedure. If your Diastolic (lower reading)is above 100 mmHg, elective cases will be cancelled/rescheduled. . Blood thinners: These will need to be stopped for procedures. Notify our staff if you are taking any blood thinners. Depending on which one you take, there will be specific instructions on how and when to stop it. . Diabetics on insulin: Notify the staff so that you can be scheduled 1st case in the morning. If your diabetes requires high dose insulin, take only  of your normal insulin dose the morning of the procedure and notify the staff that you have done so. . Preventing infections: Shower with an antibacterial soap the morning of your procedure. . Build-up your immune system: Take 1000 mg of Vitamin C with every meal (3 times a day) the day prior to your procedure. . Antibiotics: Inform the staff if you have a condition or reason that requires you to take antibiotics before dental procedures. . Pregnancy: If you are pregnant, call and cancel the procedure. . Sickness: If you have a cold, fever, or any active infections, call and cancel the procedure. . Arrival: You must be in the facility at least 30 minutes prior to your scheduled procedure. . Children: Do not bring children with you. . Dress appropriately:  Bring dark clothing that you would not mind if they get stained. . Valuables: Do not bring any jewelry or valuables.  Reasons to call and reschedule or cancel your procedure: (Following these recommendations will minimize the risk of a serious complication.) . Surgeries: Avoid having procedures within 2 weeks of any surgery. (Avoid for 2 weeks before or after any surgery). . Flu Shots: Avoid having procedures within 2 weeks of a flu shots or . (Avoid for 2 weeks before or after immunizations). . Barium: Avoid having a procedure within 7-10 days after having had a radiological study involving the use of radiological contrast. (Myelograms, Barium swallow or enema study). . Heart attacks: Avoid any elective procedures or surgeries for the initial 6 months after a "Myocardial Infarction" (Heart Attack). . Blood thinners: It is imperative that you stop these medications before procedures. Let us know if you if you take any blood thinner.  . Infection: Avoid procedures during or within two weeks of an infection (including chest colds or gastrointestinal problems). Symptoms associated with infections include: Localized redness, fever, chills, night sweats or profuse sweating, burning sensation when voiding, cough, congestion, stuffiness, runny nose, sore throat, diarrhea, nausea, vomiting, cold or Flu symptoms, recent or current infections. It is specially important if the infection is over the area that we intend to treat. . Heart and lung problems: Symptoms that may suggest an active cardiopulmonary problem include: cough, chest pain, breathing difficulties or shortness of breath, dizziness, ankle swelling, uncontrolled high or unusually low blood pressure, and/or palpitations. If you are experiencing any of these symptoms, cancel your procedure and contact your primary care physician for an evaluation.  Remember:  Regular Business hours are:    Monday to Thursday 8:00 AM to 4:00 PM  Provider's  Schedule: Damel Querry, MD:  Procedure days: Tuesday and Thursday 7:30 AM to 4:00 PM  Bilal Lateef, MD:  Procedure days: Monday and Wednesday 7:30 AM to 4:00 PM ____________________________________________________________________________________________   ____________________________________________________________________________________________  General Risks and Possible Complications  Patient Responsibilities: It is important that you read this as it is part of your informed consent. It is our duty to inform you of the risks and possible complications associated with treatments offered to you. It is your responsibility as a patient to read this and to ask questions about anything that is not clear or that you believe was not covered in this document.  Patient's Rights: You have the right to refuse treatment. You also have the right to change your mind, even after initially having agreed to have the treatment done. However, under this last option, if you wait until the last second to change your mind, you may be charged for the materials used up to that point.  Introduction: Medicine is not an exact science. Everything in Medicine, including the lack of treatment(s), carries the potential for danger, harm, or loss (which is by definition: Risk). In Medicine, a complication is a secondary problem, condition, or disease that can aggravate an already existing one. All treatments carry the risk of possible complications. The fact that a side effects or complications occurs, does not imply that the treatment was conducted incorrectly. It must be clearly understood that these can happen even when everything is done following the highest safety standards.  No treatment: You can choose not to proceed with the proposed treatment alternative. The "PRO(s)" would include: avoiding the risk of complications associated with the therapy. The "CON(s)" would include: not getting any of the treatment  benefits. These benefits fall under one of three categories: diagnostic; therapeutic; and/or palliative. Diagnostic benefits include: getting information which can ultimately lead to improvement of the disease or symptom(s). Therapeutic benefits are those associated with the successful treatment of the disease. Finally, palliative benefits are those related to the decrease of the primary symptoms, without necessarily curing the condition (example: decreasing the pain from a flare-up of a chronic condition, such as incurable terminal cancer).  General Risks and Complications: These are associated to most interventional treatments. They can occur alone, or in combination. They fall under one of the following six (6) categories: no benefit or worsening of symptoms; bleeding; infection; nerve damage; allergic reactions; and/or death. 1. No benefits or worsening of symptoms: In Medicine there are no guarantees, only probabilities. No healthcare provider can ever guarantee that a medical treatment will work, they can only state the probability that it may. Furthermore, there is always the possibility that the condition may worsen, either directly, or indirectly, as a consequence of the treatment. 2. Bleeding: This is more common if the patient is taking a blood thinner, either prescription or over the counter (example: Goody Powders, Fish oil, Aspirin, Garlic, etc.), or if suffering a condition associated with impaired coagulation (example: Hemophilia, cirrhosis of the liver, low platelet counts, etc.). However, even if you do not have one on these, it can still happen. If you have any of these conditions, or take one of these drugs, make sure to notify your treating physician. 3. Infection: This is more common in patients with a compromised immune system, either due to disease (example: diabetes, cancer, human immunodeficiency virus [HIV], etc.), or due to medications or treatments (example: therapies used to treat  cancer and   rheumatological diseases). However, even if you do not have one on these, it can still happen. If you have any of these conditions, or take one of these drugs, make sure to notify your treating physician. 4. Nerve Damage: This is more common when the treatment is an invasive one, but it can also happen with the use of medications, such as those used in the treatment of cancer. The damage can occur to small secondary nerves, or to large primary ones, such as those in the spinal cord and brain. This damage may be temporary or permanent and it may lead to impairments that can range from temporary numbness to permanent paralysis and/or brain death. 5. Allergic Reactions: Any time a substance or material comes in contact with our body, there is the possibility of an allergic reaction. These can range from a mild skin rash (contact dermatitis) to a severe systemic reaction (anaphylactic reaction), which can result in death. 6. Death: In general, any medical intervention can result in death, most of the time due to an unforeseen complication. ____________________________________________________________________________________________  ____________________________________________________________________________________________  Blood Thinners  IMPORTANT NOTICE:  If you take any of these, make sure to notify the nursing staff.  Failure to do so may result in injury.  Recommended time intervals to stop and restart blood-thinners, before & after invasive procedures  Generic Name Brand Name Stop Time. Must be stopped at least this long before procedures. After procedures, wait at least this long before re-starting.  Abciximab Reopro 15 days 2 hrs  Alteplase Activase 10 days 10 days  Anagrelide Agrylin    Apixaban Eliquis 3 days 6 hrs  Cilostazol Pletal 3 days 5 hrs  Clopidogrel Plavix 7-10 days 2 hrs  Dabigatran Pradaxa 5 days 6 hrs  Dalteparin Fragmin 24 hours 4 hrs  Dipyridamole Aggrenox  11days 2 hrs  Edoxaban Lixiana; Savaysa 3 days 2 hrs  Enoxaparin  Lovenox 24 hours 4 hrs  Eptifibatide Integrillin 8 hours 2 hrs  Fondaparinux  Arixtra 72 hours 12 hrs  Hydroxychloroquine Plaquenil 11 days   Prasugrel Effient 7-10 days 6 hrs  Reteplase Retavase 10 days 10 days  Rivaroxaban Xarelto 3 days 6 hrs  Ticagrelor Brilinta 5-7 days 6 hrs  Ticlopidine Ticlid 10-14 days 2 hrs  Tinzaparin Innohep 24 hours 4 hrs  Tirofiban Aggrastat 8 hours 2 hrs  Warfarin Coumadin 5 days 2 hrs   Other medications with blood-thinning effects  Product indications Generic (Brand) names Note  Cholesterol Lipitor Stop 4 days before procedure  Blood thinner (injectable) Heparin (LMW or LMWH Heparin) Stop 24 hours before procedure  Cancer Ibrutinib (Imbruvica) Stop 7 days before procedure  Malaria/Rheumatoid Hydroxychloroquine (Plaquenil) Stop 11 days before procedure  Thrombolytics  10 days before or after procedures   Over-the-counter (OTC) Products with blood-thinning effects  Product Common names Stop Time  Aspirin > 325 mg Goody Powders, Excedrin, etc. 11 days  Aspirin ? 81 mg  7 days  Fish oil  4 days  Garlic supplements  7 days  Ginkgo biloba  36 hours  Ginseng  24 hours  NSAIDs Ibuprofen, Naprosyn, etc. 3 days  Vitamin E  4 days   ____________________________________________________________________________________________ Stop your Eliquis 3 days before your procedure if ok with your PCP

## 2020-10-17 ENCOUNTER — Ambulatory Visit: Payer: Medicare Other | Admitting: Pain Medicine

## 2020-10-24 ENCOUNTER — Ambulatory Visit
Admission: RE | Admit: 2020-10-24 | Discharge: 2020-10-24 | Disposition: A | Discharge status: Home / Self Care | Payer: Medicare Other | Source: Ambulatory Visit | Attending: Pain Medicine | Admitting: Pain Medicine

## 2020-10-24 ENCOUNTER — Other Ambulatory Visit: Payer: Self-pay

## 2020-10-24 DIAGNOSIS — M5136 Other intervertebral disc degeneration, lumbar region: Secondary | ICD-10-CM | POA: Diagnosis present

## 2020-10-24 DIAGNOSIS — M47816 Spondylosis without myelopathy or radiculopathy, lumbar region: Secondary | ICD-10-CM | POA: Insufficient documentation

## 2020-10-24 DIAGNOSIS — G8929 Other chronic pain: Secondary | ICD-10-CM | POA: Diagnosis present

## 2020-10-24 DIAGNOSIS — M79605 Pain in left leg: Secondary | ICD-10-CM | POA: Diagnosis present

## 2020-10-24 DIAGNOSIS — M545 Low back pain, unspecified: Secondary | ICD-10-CM | POA: Diagnosis not present

## 2020-10-25 NOTE — Progress Notes (Signed)
PROVIDER NOTE: Information contained herein reflects review and annotations entered in association with encounter. Interpretation of such information and data should be left to medically-trained personnel. Information provided to patient can be located elsewhere in the medical record under "Patient Instructions". Document created using STT-dictation technology, any transcriptional errors that may result from process are unintentional.    Patient: Sabrina Green  Service Category: Procedure  Provider: Gaspar Cola, MD  DOB: 28-Sep-1945  DOS: 10/26/2020  Location: [facility]  MRN: [id number]  Setting: Ambulatory - outpatient  Referring Provider: Sofie Hartigan, MD  Type: Established Patient  Specialty: Interventional Pain Management  PCP: Sofie Hartigan, MD   Primary Reason for Visit: Interventional Pain Management Treatment. CC: Back Pain  Procedure #1:  Anesthesia, Analgesia, Anxiolysis:  Type: Therapeutic Intra-Articular Hyalgan Knee Injection #1  Region: Lateral infrapatellar Knee Region Level: Knee Joint Laterality: Right knee  Type: Moderate (Conscious) Sedation combined with Local Anesthesia Indication(s): Analgesia and Anxiety Local Anesthetic: Lidocaine 1-2% Route: Intravenous (IV) IV Access: Secured Sedation: Meaningful verbal contact was maintained at all times during the procedure   Position: Sitting   Indications: 1. Chronic knee pain (Right)   2. Osteoarthritis of knee (Right)   3. Tricompartment osteoarthritis of knee (Right)    Procedure #2:  Anesthesia, Analgesia, Anxiolysis:  Type: Lumbar Facet, Medial Branch Block(s) #2  Primary Purpose: Diagnostic Region: Posterolateral Lumbosacral Spine Level: L2, L3, L4, L5, & S1 Medial Branch Level(s). Injecting these levels blocks the L3-4, L4-5, and L5-S1 lumbar facet joints. Laterality: Left  Type: Moderate (Conscious) Sedation combined with Local Anesthesia Indication(s): Analgesia and  Anxiety Route: Intravenous (IV) IV Access: Secured Sedation: Meaningful verbal contact was maintained at all times during the procedure  Local Anesthetic: Lidocaine 1-2%  Position: Prone   Indications: 1. 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 Spondylosis without myelopathy or radiculopathy, lumbosacral region   3. Chronic low back pain (2ry area of Pain) (Bilateral) (L>R) w/o sciatica   4. DDD (degenerative disc disease), lumbar    Pain Score: Pre-procedure: 6 /10 Post-procedure: 0-No pain/10   Note: Challenging procedure secondary to the patient's anxiety and constant "jumping" every time that she was touched.  Pre-op H&P Assessment:  [name] is a 75 y.o. (year old), female patient, seen today for interventional treatment. 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 [name] has a current medication list which includes the following prescription(s): abatacept, allopurinol, apixaban, ascorbic acid, atomoxetine, atorvastatin, buspirone, chlorthalidone, cholecalciferol, vitamin b-12, fluticasone, folic acid, gabapentin, hydrocodone-acetaminophen, labetalol, losartan, lubiprostone, methotrexate, methotrexate, nortriptyline, omeprazole, oxycodone, pantoprazole, and tizanidine, and the following Facility-Administered Medications: fentanyl, iohexol, and midazolam. Her primarily concern today is the Back Pain  Initial Vital Signs:  Pulse/HCG Rate: 69ECG Heart Rate: 74 Temp: (!) 97.1 F (36.2 C) Resp: 17 BP: (!) 150/63 SpO2: 100 %  BMI: Estimated body mass index is 40.94 kg/m as calculated from the following:   Height as of this encounter: 5\' 5"  (1.651 m).    Weight as of this encounter: 246 lb (111.6 kg).  Risk Assessment: Allergies: Reviewed. She is allergic to penicillins.  Allergy Precautions: None required Coagulopathies: Reviewed. None identified.  Blood-thinner therapy: None at this time Active Infection(s): Reviewed. None identified. [name] is afebrile  Site Confirmation: Ms.  Antilla was asked to confirm the procedure and laterality before marking the site Procedure checklist: Completed Consent: Before the procedure and under the influence of no sedative(s), amnesic(s), or anxiolytics, the patient was informed of the treatment options, risks and possible complications. To fulfill our ethical and legal obligations, as recommended by the American Medical Association's Code of Ethics, I have informed the patient of my clinical impression; the nature and purpose of the treatment or procedure; the risks, benefits, and possible complications of the intervention; the alternatives, including doing nothing; the risk(s) and benefit(s) of the alternative treatment(s) or procedure(s); and the risk(s) and benefit(s) of doing nothing. The patient was provided information about the general risks and possible complications associated with the procedure. These may include, but are not limited to: failure to achieve desired goals, infection, bleeding, organ or nerve damage, allergic reactions, paralysis, and death. In addition, the patient was informed of those risks and complications associated to the procedure, such as failure to decrease pain; infection; bleeding; organ or nerve damage with subsequent damage to sensory, motor, and/or autonomic systems, resulting in permanent pain, numbness, and/or weakness of one or several areas of the body; allergic reactions; (i.e.: anaphylactic reaction); and/or death. Furthermore, the patient was informed of those risks and complications associated with the medications. These include, but are not limited to: allergic  reactions (i.e.: anaphylactic or anaphylactoid reaction(s)); adrenal axis suppression; blood sugar elevation that in diabetics may result in ketoacidosis or comma; water retention that in patients with history of congestive heart failure may result in shortness of breath, pulmonary edema, and decompensation with resultant heart failure; weight gain; swelling or edema; medication-induced neural toxicity; particulate matter embolism and blood vessel occlusion with resultant organ, and/or nervous system infarction; and/or aseptic necrosis of one or more joints. Finally, the patient was informed that Medicine is not an exact science; therefore, there is also the possibility of unforeseen or unpredictable risks and/or possible complications that may result in a catastrophic outcome. The patient indicated having understood very clearly. We have given the patient no guarantees and we have made no promises. Enough time was given to the patient to ask questions, all of which were answered to the patient's satisfaction. Ms. Gruetzmacher has indicated that she wanted to continue with the procedure. Attestation: I, the ordering provider, attest that I have discussed with the patient the benefits, risks, side-effects, alternatives, likelihood of achieving goals, and potential problems during recovery for the procedure that I have provided informed consent. Date  Time: 10/26/2020  8:37 AM  Pre-Procedure Preparation:  Monitoring: As per clinic protocol. Respiration, ETCO2, SpO2, BP, heart rate and rhythm monitor placed and checked for adequate function Safety Precautions: Patient was assessed for positional comfort and pressure points before starting the procedure. Time-out: I initiated and conducted the "Time-out" before starting the procedure, as per protocol. The patient was asked to participate by confirming the accuracy of the "Time Out" information. Verification of the correct person, site, and procedure were performed and  confirmed by me, the nursing staff, and the patient. "Time-out" conducted as per Joint Commission's Universal Protocol (UP.01.01.01). Time: 9628 (0912 Knee injection)  Description of Procedure #1:  Target Area: Knee Joint Approach: Just above the Lateral tibial plateau, lateral to the infrapatellar tendon. Area Prepped: Entire knee area, from the mid-thigh to the mid-shin. DuraPrep (Iodine Povacrylex [0.7% available iodine] and Isopropyl Alcohol, 74% w/w) Safety Precautions: Aspiration looking for blood return was conducted prior to all injections. At no point did we inject any  substances, as a needle was being advanced. No attempts were made at seeking any paresthesias. Safe injection practices and needle disposal techniques used. Medications properly checked for expiration dates. SDV (single dose vial) medications used. Description of the Procedure: Protocol guidelines were followed. The patient was placed in position over the fluoroscopy table. The target area was identified and the area prepped in the usual manner. Skin & deeper tissues infiltrated with local anesthetic. Appropriate amount of time allowed to pass for local anesthetics to take effect. The procedure needles were then advanced to the target area. Proper needle placement secured. Negative aspiration confirmed. Solution injected in intermittent fashion, asking for systemic symptoms every 0.5cc of injectate. The needles were then removed and the area cleansed, making sure to leave some of the prepping solution back to take advantage of its long term bactericidal properties.  Vitals:   10/26/20 0944 10/26/20 0949 10/26/20 0959 10/26/20 1010  BP: 135/63 (!) 127/53 (!) 128/51 (!) 128/58  Pulse: 72     Resp: 15 14 14    Temp:  97.8 F (36.6 C)  (!) 96.9 F (36.1 C)  TempSrc:  Temporal    SpO2: 97% 98% 98% 95%  Weight:      Height:        Start Time: 0934 hrs.  Materials:  Needle(s) Type: Regular needle Gauge: 25G Length:  1.5-in Medication(s): Please see orders for medications and dosing details.  Imaging Guidance for procedure #1:  Type of Imaging Technique: None used Indication(s): N/A Exposure Time: No patient exposure Contrast: None used. Fluoroscopic Guidance: N/A Ultrasound Guidance: N/A Interpretation: N/A  Description of Procedure #2:  Laterality: Left Levels:  L2, L3, L4, L5, & S1 Medial Branch Level(s) Area Prepped: Posterior Lumbosacral Region DuraPrep (Iodine Povacrylex [0.7% available iodine] and Isopropyl Alcohol, 74% w/w) Safety Precautions: Aspiration looking for blood return was conducted prior to all injections. At no point did we inject any substances, as a needle was being advanced. Before injecting, the patient was told to immediately notify me if she was experiencing any new onset of "ringing in the ears, or metallic taste in the mouth". No attempts were made at seeking any paresthesias. Safe injection practices and needle disposal techniques used. Medications properly checked for expiration dates. SDV (single dose vial) medications used. After the completion of the procedure, all disposable equipment used was discarded in the proper designated medical waste containers. Local Anesthesia: Protocol guidelines were followed. The patient was positioned over the fluoroscopy table. The area was prepped in the usual manner. The time-out was completed. The target area was identified using fluoroscopy. A 12-in long, straight, sterile hemostat was used with fluoroscopic guidance to locate the targets for each level blocked. Once located, the skin was marked with an approved surgical skin marker. Once all sites were marked, the skin (epidermis, dermis, and hypodermis), as well as deeper tissues (fat, connective tissue and muscle) were infiltrated with a small amount of a short-acting local anesthetic, loaded on a 10cc syringe with a 25G, 1.5-in  Needle. An appropriate amount of time was allowed for local  anesthetics to take effect before proceeding to the next step. Local Anesthetic: Lidocaine 2.0% The unused portion of the local anesthetic was discarded in the proper designated containers. Technical explanation of process:  L2 Medial Branch Nerve Block (MBB): The target area for the L2 medial branch is at the junction of the postero-lateral aspect of the superior articular process and the superior, posterior, and medial edge of the transverse process of  L3. Under fluoroscopic guidance, a Quincke needle was inserted until contact was made with os over the superior postero-lateral aspect of the pedicular shadow (target area). After negative aspiration for blood, 0.5 mL of the nerve block solution was injected without difficulty or complication. The needle was removed intact. L3 Medial Branch Nerve Block (MBB): The target area for the L3 medial branch is at the junction of the postero-lateral aspect of the superior articular process and the superior, posterior, and medial edge of the transverse process of L4. Under fluoroscopic guidance, a Quincke needle was inserted until contact was made with os over the superior postero-lateral aspect of the pedicular shadow (target area). After negative aspiration for blood, 0.5 mL of the nerve block solution was injected without difficulty or complication. The needle was removed intact. L4 Medial Branch Nerve Block (MBB): The target area for the L4 medial branch is at the junction of the postero-lateral aspect of the superior articular process and the superior, posterior, and medial edge of the transverse process of L5. Under fluoroscopic guidance, a Quincke needle was inserted until contact was made with os over the superior postero-lateral aspect of the pedicular shadow (target area). After negative aspiration for blood, 0.5 mL of the nerve block solution was injected without difficulty or complication. The needle was removed intact. L5 Medial Branch Nerve Block (MBB):  The target area for the L5 medial branch is at the junction of the postero-lateral aspect of the superior articular process and the superior, posterior, and medial edge of the sacral ala. Under fluoroscopic guidance, a Quincke needle was inserted until contact was made with os over the superior postero-lateral aspect of the pedicular shadow (target area). After negative aspiration for blood, 0.5 mL of the nerve block solution was injected without difficulty or complication. The needle was removed intact. S1 Medial Branch Nerve Block (MBB): The target area for the S1 medial branch is at the posterior and inferior 6 o'clock position of the L5-S1 facet joint. Under fluoroscopic guidance, the Quincke needle inserted for the L5 MBB was redirected until contact was made with os over the inferior and postero aspect of the sacrum, at the 6 o' clock position under the L5-S1 facet joint (Target area). After negative aspiration for blood, 0.5 mL of the nerve block solution was injected without difficulty or complication. The needle was removed intact.  Nerve block solution: 0.2% PF-Ropivacaine + Triamcinolone (40 mg/mL) diluted to a final concentration of 4 mg of Triamcinolone/mL of Ropivacaine The unused portion of the solution was discarded in the proper designated containers. Procedural Needles: 22-gauge, 7-inch, Quincke needles used for all levels.  Once the entire procedure was completed, the treated area was cleaned, making sure to leave some of the prepping solution back to take advantage of its long term bactericidal properties.      Illustration of the posterior view of the lumbar spine and the posterior neural structures. Laminae of L2 through S1 are labeled. DPRL5, dorsal primary ramus of L5; DPRS1, dorsal primary ramus of S1; DPR3, dorsal primary ramus of L3; FJ, facet (zygapophyseal) joint L3-L4; I, inferior articular process of L4; LB1, lateral branch of dorsal primary ramus of L1; IAB, inferior  articular branches from L3 medial branch (supplies L4-L5 facet joint); IBP, intermediate branch plexus; MB3, medial branch of dorsal primary ramus of L3; NR3, third lumbar nerve root; S, superior articular process of L5; SAB, superior articular branches from L4 (supplies L4-5 facet joint also); TP3, transverse process of L3.  Vitals:  10/26/20 0944 10/26/20 0949 10/26/20 0959 10/26/20 1010  BP: 135/63 (!) 127/53 (!) 128/51 (!) 128/58  Pulse: 72     Resp: 15 14 14    Temp:  97.8 F (36.6 C)  (!) 96.9 F (36.1 C)  TempSrc:  Temporal    SpO2: 97% 98% 98% 95%  Weight:      Height:         End Time: 0943 (3762 Knee injection) hrs.  Imaging Guidance (Spinal) for procedure #2:  Type of Imaging Technique: Fluoroscopy Guidance (Spinal) Indication(s): Assistance in needle guidance and placement for procedures requiring needle placement in or near specific anatomical locations not easily accessible without such assistance. Exposure Time: Please see nurses notes. Contrast: None used. Fluoroscopic Guidance: I was personally present during the use of fluoroscopy. "Tunnel Vision Technique" used to obtain the best possible view of the target area. Parallax error corrected before commencing the procedure. "Direction-depth-direction" technique used to introduce the needle under continuous pulsed fluoroscopy. Once target was reached, antero-posterior, oblique, and lateral fluoroscopic projection used confirm needle placement in all planes. Images permanently stored in EMR. Interpretation: No contrast injected. I personally interpreted the imaging intraoperatively. Adequate needle placement confirmed in multiple planes. Permanent images saved into the patient's record.  Antibiotic Prophylaxis:   Anti-infectives (From admission, onward)   None     Indication(s): None identified  Post-operative Assessment:  Post-procedure Vital Signs:  Pulse/HCG Rate: 72 (nsr)72 Temp: (!) 96.9 F (36.1 C) Resp:  14 BP: (!) 128/58 SpO2: 95 %  EBL: None  Complications: No immediate post-treatment complications observed by team, or reported by patient.  Note: The patient tolerated the entire procedure well. A repeat set of vitals were taken after the procedure and the patient was kept under observation following institutional policy, for this type of procedure. Post-procedural neurological assessment was performed, showing return to baseline, prior to discharge. The patient was provided with post-procedure discharge instructions, including a section on how to identify potential problems. Should any problems arise concerning this procedure, the patient was given instructions to immediately contact us, at any time, without hesitation. In any case, we plan to contact the patient by telephone for a follow-up status report regarding this interventional procedure.  Comments:  No additional relevant information.  Plan of Care  Orders:  Orders Placed This Encounter  Procedures  . KNEE INJECTION    Hyalgan knee injection to be done by MD.    Scheduling Instructions:     Procedure: Intra-articular Hyalgan Knee injection #1     Side(s): Right Knee     Sedation: With Sedation.     Timeframe: Today    Order Specific Question:   Where will this procedure be performed?    Answer:   [facility]  . LUMBAR FACET(MEDIAL BRANCH NERVE BLOCK) MBNB    Scheduling Instructions:     Procedure: Lumbar facet block (AKA.: Lumbosacral medial branch nerve block)     Side: Left-sided     Level: L3-4, L4-5, & L5-S1 Facets (L2, L3, L4, L5, & S1 Medial Branch Nerves)     Sedation: Patient's choice.     Timeframe: Today    Order Specific Question:   Where will this procedure be performed?    Answer:   [facility]  . KNEE INJECTION    Hyalgan knee injection. Please order Hyalgan.    Standing Status:   Future    Standing Expiration Date:   11/25/2020    Scheduling Instructions:  Procedure: Intra-articular  Hyalgan Knee injection #2     Side: Right-sided     Sedation: None     Timeframe: in two (2) weeks    Order Specific Question:   Where will this procedure be performed?    Answer:   [facility]  . DG PAIN CLINIC C-ARM 1-60 MIN NO REPORT    Intraoperative interpretation by procedural physician at [place].    Standing Status:   Standing    Number of Occurrences:   1    Order Specific Question:   Reason for exam:    Answer:   Assistance in needle guidance and placement for procedures requiring needle placement in or near specific anatomical locations not easily accessible without such assistance.  . Informed Consent Details: Physician/Practitioner Attestation; Transcribe to consent form and obtain patient signature    Note: Always confirm laterality of pain with Ms. Langi, before procedure. Transcribe to consent form and obtain patient signature.    Order Specific Question:   Physician/Practitioner attestation of informed consent for procedure/surgical case    Answer:   I, the physician/practitioner, attest that I have discussed with the patient the benefits, risks, side effects, alternatives, likelihood of achieving goals and potential problems during recovery for the procedure that I have provided informed consent.    Order Specific Question:   Procedure    Answer:   Therapeutic, right sided, intra-articular Hyalgan knee injection    Order Specific Question:   Physician/Practitioner performing the procedure    Answer:   Alessander Sikorski A. Dossie Arbour, MD    Order Specific Question:   Indication/Reason    Answer:   Chronic right knee pain secondary to primary osteoarthritis of the knee  . Care order/instruction: Please confirm that the patient has stopped the Eliquis (Apixaban) x 3 days prior to procedure or surgery.    Please confirm that the patient has stopped the Eliquis (Apixaban) x 3 days prior to procedure or surgery.    Standing Status:   Standing    Number of  Occurrences:   1  . Provide equipment / supplies at bedside    "Block Tray" (Disposable  single use) Needle type: SpinalSpinal Amount/quantity: 4 Size: Long (7-inch) Gauge: 22G    Standing Status:   Standing    Number of Occurrences:   1    Order Specific Question:   Specify    Answer:   Block Tray  . Informed Consent Details: Physician/Practitioner Attestation; Transcribe to consent form and obtain patient signature    Nursing Order: Transcribe to consent form and obtain patient signature. Note: Always confirm laterality of pain with Ms. Sakamoto, before procedure.    Order Specific Question:   Physician/Practitioner attestation of informed consent for procedure/surgical case    Answer:   I, the physician/practitioner, attest that I have discussed with the patient the benefits, risks, side effects, alternatives, likelihood of achieving goals and potential problems during recovery for the procedure that I have provided informed consent.    Order Specific Question:   Procedure    Answer:   Lumbar Facet Block  under fluoroscopic guidance    Order Specific Question:   Physician/Practitioner performing the procedure    Answer:   Roma Bondar A. Dossie Arbour MD    Order Specific Question:   Indication/Reason    Answer:   Low Back Pain, with our without leg pain, due to Facet Joint Arthralgia (Joint Pain) Spondylosis (Arthritis of the Spine), without myelopathy or radiculopathy (Nerve Damage).  . Bleeding  precautions    Standing Status:   Standing    Number of Occurrences:   1   Chronic Opioid Analgesic:  No opioid analgesics prescribed by our practice    Medications ordered for procedure: Meds ordered this encounter  Medications  . iohexol (OMNIPAQUE) 180 MG/ML injection 10 mL    Must be Myelogram-compatible. If not available, you may substitute with a water-soluble, non-ionic, hypoallergenic, myelogram-compatible radiological contrast medium.  Marland Kitchen lidocaine (XYLOCAINE) 2 % (with pres) injection 400  mg  . lactated ringers infusion 1,000 mL  . midazolam (VERSED) 5 MG/5ML injection 1-2 mg    Make sure Flumazenil is available in the pyxis when using this medication. If oversedation occurs, administer 0.2 mg IV over 15 sec. If after 45 sec no response, administer 0.2 mg again over 1 min; may repeat at 1 min intervals; not to exceed 4 doses (1 mg)  . fentaNYL (SUBLIMAZE) injection 25-50 mcg    Make sure Narcan is available in the pyxis when using this medication. In the event of respiratory depression (RR< 8/min): Titrate NARCAN (naloxone) in increments of 0.1 to 0.2 mg IV at 2-3 minute intervals, until desired degree of reversal.  . lidocaine (PF) (XYLOCAINE) 1 % injection 5 mL  . ropivacaine (PF) 2 mg/mL (0.2%) (NAROPIN) injection 5 mL  . Sodium Hyaluronate SOSY 2 mL  . ropivacaine (PF) 2 mg/mL (0.2%) (NAROPIN) injection 9 mL  . triamcinolone acetonide (KENALOG-40) injection 40 mg  . ropivacaine (PF) 2 mg/mL (0.2%) (NAROPIN) injection 9 mL  . triamcinolone acetonide (KENALOG-40) injection 40 mg   Medications administered: We administered lidocaine, lactated ringers, midazolam, fentaNYL, lidocaine (PF), ropivacaine (PF) 2 mg/mL (0.2%), Sodium Hyaluronate, ropivacaine (PF) 2 mg/mL (0.2%), triamcinolone acetonide, ropivacaine (PF) 2 mg/mL (0.2%), and triamcinolone acetonide.  See the medical record for exact dosing, route, and time of administration.  Follow-up plan:   Return in about 2 weeks (around 11/09/2020) for Procedure (no sedation): (R) Hyalgan #2 & PPE.       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  NO RFA until BMI <35. 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  Refers needing an open MRI. History of generalized anxiety and panic attacks.   Planned  Pending:   Pending further evaluation   Under consideration:   Diagnosticbilateral genicular nerve block #1 Possible bilateral genicular nerve RFA Diagnosticleft  side LESI Diagnostic bilateral lumbar facet RFA Diagnosticright suprascapular NB Possible right suprascapular RFA   Completed:   Palliative bilateral IA steroid knee injection x2(06/01/2019) Diagnosticright lumbar facet MBB x1(02/17/2018)  Diagnosticleft lumbar facet MBB x1(02/17/2018)  Therapeutic bilateral Hyalgan knee inj. x6 (08/06/2018) (fluoroscopy required)  Diagnostic bilateral glenohumeral Joint (shoulder), AC joint, & subdeltoid bursaInjectionx1(08/20/2018)    Therapeutic  Palliative (PRN) options:   Palliative bilateral IA steroid knee injection #3 Diagnosticbilateral lumbar facet MBB #2 Therapeutic bilateral Hyalgan knee inj. #W1U2 (last done 09/09/2019) (fluoroscopy required) Diagnostic bilateral glenohumeral Joint (shoulder), AC joint, & subdeltoid bursaInjection#2     Recent Visits Date Type Provider Dept  10/10/20 Office Visit Milinda Pointer, MD [facility]  Showing recent visits within past 90 days and meeting all other requirements Today's Visits Date Type Provider Dept  10/26/20 Procedure visit Milinda Pointer, MD [facility]  Showing today's visits and meeting all other requirements Future Appointments Date Type Provider Dept  11/07/20 Appointment Milinda Pointer, MD [facility]  Showing future appointments within next 90 days and meeting all other requirements  Disposition: Discharge home  Discharge (Date  Time): 10/26/2020; 1015 hrs.   Primary Care Physician: Sofie Hartigan, MD Location: [facility] Note by: Gaspar Cola, MD Date: 10/26/2020; Time: 12:24 PM  Disclaimer:  Medicine is not an Chief Strategy Officer. The only guarantee in medicine is that nothing is guaranteed. It is important to note that the decision to proceed with this intervention was based on the information collected from the patient. The Data and conclusions were drawn from the patient's  questionnaire, the interview, and the physical examination. Because the information was provided in large part by the patient, it cannot be guaranteed that it has not been purposely or unconsciously manipulated. Every effort has been made to obtain as much relevant data as possible for this evaluation. It is important to note that the conclusions that lead to this procedure are derived in large part from the available data. Always take into account that the treatment will also be dependent on availability of resources and existing treatment guidelines, considered by other Pain Management Practitioners as being common knowledge and practice, at the time of the intervention. For Medico-Legal purposes, it is also important to point out that variation in procedural techniques and pharmacological choices are the acceptable norm. The indications, contraindications, technique, and results of the above procedure should only be interpreted and judged by a Board-Certified Interventional Pain Specialist with extensive familiarity and expertise in the same exact procedure and technique.

## 2020-10-26 ENCOUNTER — Ambulatory Visit
Admission: RE | Admit: 2020-10-26 | Discharge: 2020-10-26 | Disposition: A | Discharge status: Home / Self Care | Payer: Medicare Other | Source: Ambulatory Visit | Attending: Pain Medicine | Admitting: Pain Medicine

## 2020-10-26 ENCOUNTER — Other Ambulatory Visit: Payer: Self-pay

## 2020-10-26 ENCOUNTER — Encounter: Payer: Self-pay | Admitting: Pain Medicine

## 2020-10-26 ENCOUNTER — Ambulatory Visit (HOSPITAL_BASED_OUTPATIENT_CLINIC_OR_DEPARTMENT_OTHER): Payer: Medicare Other | Admitting: Pain Medicine

## 2020-10-26 VITALS — BP 128/58 | HR 72 | Temp 96.9°F | Resp 14 | Ht 65.0 in | Wt 246.0 lb

## 2020-10-26 DIAGNOSIS — M47817 Spondylosis without myelopathy or radiculopathy, lumbosacral region: Secondary | ICD-10-CM

## 2020-10-26 DIAGNOSIS — M47816 Spondylosis without myelopathy or radiculopathy, lumbar region: Secondary | ICD-10-CM | POA: Diagnosis present

## 2020-10-26 DIAGNOSIS — Z7901 Long term (current) use of anticoagulants: Secondary | ICD-10-CM | POA: Diagnosis present

## 2020-10-26 DIAGNOSIS — M5136 Other intervertebral disc degeneration, lumbar region: Secondary | ICD-10-CM | POA: Insufficient documentation

## 2020-10-26 DIAGNOSIS — G8929 Other chronic pain: Secondary | ICD-10-CM

## 2020-10-26 DIAGNOSIS — M25561 Pain in right knee: Secondary | ICD-10-CM | POA: Diagnosis present

## 2020-10-26 DIAGNOSIS — M51369 Other intervertebral disc degeneration, lumbar region without mention of lumbar back pain or lower extremity pain: Secondary | ICD-10-CM

## 2020-10-26 DIAGNOSIS — M545 Low back pain, unspecified: Secondary | ICD-10-CM | POA: Diagnosis not present

## 2020-10-26 DIAGNOSIS — M1711 Unilateral primary osteoarthritis, right knee: Secondary | ICD-10-CM | POA: Insufficient documentation

## 2020-10-26 MED ORDER — SODIUM HYALURONATE (VISCOSUP) 20 MG/2ML IX SOSY
2.0000 mL | PREFILLED_SYRINGE | Freq: Once | INTRA_ARTICULAR | Status: AC
  Administered 2020-10-26: 2 mL via INTRA_ARTICULAR

## 2020-10-26 MED ORDER — LIDOCAINE HCL 2 % IJ SOLN
20.0000 mL | Freq: Once | INTRAMUSCULAR | Status: AC
  Administered 2020-10-26: 400 mg
  Filled 2020-10-26: qty 20

## 2020-10-26 MED ORDER — ROPIVACAINE HCL 2 MG/ML IJ SOLN
9.0000 mL | Freq: Once | INTRAMUSCULAR | Status: AC
  Administered 2020-10-26: 9 mL via PERINEURAL
  Filled 2020-10-26: qty 10

## 2020-10-26 MED ORDER — FENTANYL CITRATE (PF) 100 MCG/2ML IJ SOLN
25.0000 ug | INTRAMUSCULAR | Status: DC | PRN
  Administered 2020-10-26: 50 ug via INTRAVENOUS
  Filled 2020-10-26: qty 2

## 2020-10-26 MED ORDER — TRIAMCINOLONE ACETONIDE 40 MG/ML IJ SUSP
40.0000 mg | Freq: Once | INTRAMUSCULAR | Status: AC
  Administered 2020-10-26: 40 mg
  Filled 2020-10-26: qty 1

## 2020-10-26 MED ORDER — LIDOCAINE HCL (PF) 1 % IJ SOLN
5.0000 mL | Freq: Once | INTRAMUSCULAR | Status: AC
  Administered 2020-10-26: 5 mL
  Filled 2020-10-26: qty 5

## 2020-10-26 MED ORDER — MIDAZOLAM HCL 5 MG/5ML IJ SOLN
1.0000 mg | INTRAMUSCULAR | Status: DC | PRN
  Administered 2020-10-26: 2 mg via INTRAVENOUS
  Administered 2020-10-26: 1 mg via INTRAVENOUS
  Filled 2020-10-26: qty 5

## 2020-10-26 MED ORDER — IOHEXOL 180 MG/ML  SOLN: 10.0000 mL | Freq: Once | INTRAMUSCULAR | Status: DC

## 2020-10-26 MED ORDER — ROPIVACAINE HCL 2 MG/ML IJ SOLN
5.0000 mL | Freq: Once | INTRAMUSCULAR | Status: AC
  Administered 2020-10-26: 5 mL via INTRA_ARTICULAR
  Filled 2020-10-26: qty 10

## 2020-10-26 MED ORDER — LACTATED RINGERS IV SOLN
1000.0000 mL | Freq: Once | INTRAVENOUS | Status: AC
  Administered 2020-10-26: 1000 mL via INTRAVENOUS

## 2020-10-26 NOTE — Progress Notes (Signed)
Safety precautions to be maintained throughout the outpatient stay will include: orient to surroundings, keep bed in low position, maintain call bell within reach at all times, provide assistance with transfer out of bed and ambulation.  

## 2020-10-26 NOTE — Patient Instructions (Addendum)
____________________________________________________________________________________________  Post-Procedure Discharge Instructions  Instructions:  Apply ice:   Purpose: This will minimize any swelling and discomfort after procedure.   When: Day of procedure, as soon as you get home.  How: Fill a plastic sandwich bag with crushed ice. Cover it with a small towel and apply to injection site.  How long: (15 min on, 15 min off) Apply for 15 minutes then remove x 15 minutes.  Repeat sequence on day of procedure, until you go to bed.  Apply heat:   Purpose: To treat any soreness and discomfort from the procedure.  When: Starting the next day after the procedure.  How: Apply heat to procedure site starting the day following the procedure.  How long: May continue to repeat daily, until discomfort goes away.  Food intake: Start with clear liquids (like water) and advance to regular food, as tolerated.   Physical activities: Keep activities to a minimum for the first 8 hours after the procedure. After that, then as tolerated.  Driving: If you have received any sedation, be responsible and do not drive. You are not allowed to drive for 24 hours after having sedation.  Blood thinner: (Applies only to those taking blood thinners) You may restart your blood thinner 6 hours after your procedure.  Insulin: (Applies only to Diabetic patients taking insulin) As soon as you can eat, you may resume your normal dosing schedule.  Infection prevention: Keep procedure site clean and dry. Shower daily and clean area with soap and water.  Post-procedure Pain Diary: Extremely important that this be done correctly and accurately. Recorded information will be used to determine the next step in treatment. For the purpose of accuracy, follow these rules:  Evaluate only the area treated. Do not report or include pain from an untreated area. For the purpose of this evaluation, ignore all other areas of pain,  except for the treated area.  After your procedure, avoid taking a long nap and attempting to complete the pain diary after you wake up. Instead, set your alarm clock to go off every hour, on the hour, for the initial 8 hours after the procedure. Document the duration of the numbing medicine, and the relief you are getting from it.  Do not go to sleep and attempt to complete it later. It will not be accurate. If you received sedation, it is likely that you were given a medication that may cause amnesia. Because of this, completing the diary at a later time may cause the information to be inaccurate. This information is needed to plan your care.  Follow-up appointment: Keep your post-procedure follow-up evaluation appointment after the procedure (usually 2 weeks for most procedures, 6 weeks for radiofrequencies). DO NOT FORGET to bring you pain diary with you.   Expect: (What should I expect to see with my procedure?)  From numbing medicine (AKA: Local Anesthetics): Numbness or decrease in pain. You may also experience some weakness, which if present, could last for the duration of the local anesthetic.  Onset: Full effect within 15 minutes of injected.  Duration: It will depend on the type of local anesthetic used. On the average, 1 to 8 hours.   From steroids (Applies only if steroids were used): Decrease in swelling or inflammation. Once inflammation is improved, relief of the pain will follow.  Onset of benefits: Depends on the amount of swelling present. The more swelling, the longer it will take for the benefits to be seen. In some cases, up to 10 days.    Duration: Steroids will stay in the system x 2 weeks. Duration of benefits will depend on multiple posibilities including persistent irritating factors.  Side-effects: If present, they may typically last 2 weeks (the duration of the steroids).  Frequent: Cramps (if they occur, drink Gatorade and take over-the-counter Magnesium 450-500 mg  once to twice a day); water retention with temporary weight gain; increases in blood sugar; decreased immune system response; increased appetite.  Occasional: Facial flushing (red, warm cheeks); mood swings; menstrual changes.  Uncommon: Long-term decrease or suppression of natural hormones; bone thinning. (These are more common with higher doses or more frequent use. This is why we prefer that our patients avoid having any injection therapies in other practices.)   Very Rare: Severe mood changes; psychosis; aseptic necrosis.  From procedure: Some discomfort is to be expected once the numbing medicine wears off. This should be minimal if ice and heat are applied as instructed.  Call if: (When should I call?)  You experience numbness and weakness that gets worse with time, as opposed to wearing off.  New onset bowel or bladder incontinence. (Applies only to procedures done in the spine)  Emergency Numbers:  Durning business hours (Monday - Thursday, 8:00 AM - 4:00 PM) (Friday, 9:00 AM - 12:00 Noon): [phone]  After hours: [phone]  NOTE: If you are having a problem and are unable connect with, or to talk to a provider, then go to your nearest urgent care or emergency department. If the problem is serious and urgent, please call 911. ____________________________________________________________________________________________   ____________________________________________________________________________________________  Preparing for your procedure (without sedation)  Procedure appointments are limited to planned procedures: . No Prescription Refills. . No disability issues will be discussed. . No medication changes will be discussed.  Instructions: . Oral Intake: Do not eat or drink anything for at least 6 hours prior to your procedure. (Exception: Blood Pressure Medication. See below.) . Transportation: Unless otherwise stated by your physician, you may drive yourself  after the procedure. . Blood Pressure Medicine: Do not forget to take your blood pressure medicine with a sip of water the morning of the procedure. If your Diastolic (lower reading)is above 100 mmHg, elective cases will be cancelled/rescheduled. . Blood thinners: These will need to be stopped for procedures. Notify our staff if you are taking any blood thinners. Depending on which one you take, there will be specific instructions on how and when to stop it. . Diabetics on insulin: Notify the staff so that you can be scheduled 1st case in the morning. If your diabetes requires high dose insulin, take only  of your normal insulin dose the morning of the procedure and notify the staff that you have done so. . Preventing infections: Shower with an antibacterial soap the morning of your procedure.  . Build-up your immune system: Take 1000 mg of Vitamin C with every meal (3 times a day) the day prior to your procedure. Marland Kitchen Antibiotics: Inform the staff if you have a condition or reason that requires you to take antibiotics before dental procedures. . Pregnancy: If you are pregnant, call and cancel the procedure. . Sickness: If you have a cold, fever, or any active infections, call and cancel the procedure. . Arrival: You must be in the facility at least 30 minutes prior to your scheduled procedure. . Children: Do not bring any children with you. . Dress appropriately: Bring dark clothing that you would not mind if they get stained. . Valuables: Do not bring any jewelry  or valuables.  Reasons to call and reschedule or cancel your procedure: (Following these recommendations will minimize the risk of a serious complication.) . Surgeries: Avoid having procedures within 2 weeks of any surgery. (Avoid for 2 weeks before or after any surgery). . Flu Shots: Avoid having procedures within 2 weeks of a flu shots or . (Avoid for 2 weeks before or after immunizations). . Barium: Avoid having a procedure within 7-10  days after having had a radiological study involving the use of radiological contrast. (Myelograms, Barium swallow or enema study). . Heart attacks: Avoid any elective procedures or surgeries for the initial 6 months after a "Myocardial Infarction" (Heart Attack). . Blood thinners: It is imperative that you stop these medications before procedures. Let us know if you if you take any blood thinner.  . Infection: Avoid procedures during or within two weeks of an infection (including chest colds or gastrointestinal problems). Symptoms associated with infections include: Localized redness, fever, chills, night sweats or profuse sweating, burning sensation when voiding, cough, congestion, stuffiness, runny nose, sore throat, diarrhea, nausea, vomiting, cold or Flu symptoms, recent or current infections. It is specially important if the infection is over the area that we intend to treat. Marland Kitchen Heart and lung problems: Symptoms that may suggest an active cardiopulmonary problem include: cough, chest pain, breathing difficulties or shortness of breath, dizziness, ankle swelling, uncontrolled high or unusually low blood pressure, and/or palpitations. If you are experiencing any of these symptoms, cancel your procedure and contact your primary care physician for an evaluation.  Remember:  Regular Business hours are:  Monday to Thursday 8:00 AM to 4:00 PM  Provider's Schedule: Milinda Pointer, MD:  Procedure days: Tuesday and Thursday 7:30 AM to 4:00 PM  Gillis Santa, MD:  Procedure days: Monday and Wednesday 7:30 AM to 4:00 PM ____________________________________________________________________________________________

## 2020-10-27 ENCOUNTER — Telehealth: Payer: Self-pay

## 2020-10-27 NOTE — Telephone Encounter (Signed)
Post procedure phone call.  Patient states she is doing fine.  

## 2020-11-07 ENCOUNTER — Ambulatory Visit: Payer: Medicare Other | Admitting: Pain Medicine

## 2020-11-23 ENCOUNTER — Other Ambulatory Visit: Payer: Self-pay | Admitting: Cardiovascular Disease

## 2020-11-23 NOTE — Telephone Encounter (Signed)
35f, 111.6kg, scr 1.38 09/18/20, lovw/gollan 10/10/20

## 2020-11-23 NOTE — Telephone Encounter (Signed)
Please review for refill. Thanks!  

## 2020-11-28 ENCOUNTER — Encounter: Payer: Self-pay | Admitting: Pain Medicine

## 2020-11-28 ENCOUNTER — Other Ambulatory Visit: Payer: Self-pay

## 2020-11-28 ENCOUNTER — Ambulatory Visit: Payer: Medicare Other | Attending: Pain Medicine | Admitting: Pain Medicine

## 2020-11-28 VITALS — BP 155/91 | HR 83 | Temp 97.1°F | Resp 16 | Ht 65.0 in | Wt 246.0 lb

## 2020-11-28 DIAGNOSIS — M1711 Unilateral primary osteoarthritis, right knee: Secondary | ICD-10-CM | POA: Insufficient documentation

## 2020-11-28 DIAGNOSIS — M25561 Pain in right knee: Secondary | ICD-10-CM | POA: Insufficient documentation

## 2020-11-28 DIAGNOSIS — G8929 Other chronic pain: Secondary | ICD-10-CM

## 2020-11-28 DIAGNOSIS — Z7901 Long term (current) use of anticoagulants: Secondary | ICD-10-CM

## 2020-11-28 MED ORDER — SODIUM HYALURONATE (VISCOSUP) 20 MG/2ML IX SOSY
2.0000 mL | PREFILLED_SYRINGE | Freq: Once | INTRA_ARTICULAR | Status: AC
  Administered 2020-11-28: 2 mL via INTRA_ARTICULAR

## 2020-11-28 MED ORDER — LIDOCAINE HCL (PF) 1 % IJ SOLN
5.0000 mL | Freq: Once | INTRAMUSCULAR | Status: AC
  Administered 2020-11-28: 5 mL
  Filled 2020-11-28: qty 5

## 2020-11-28 MED ORDER — ROPIVACAINE HCL 2 MG/ML IJ SOLN
5.0000 mL | Freq: Once | INTRAMUSCULAR | Status: AC
  Administered 2020-11-28: 5 mL via INTRA_ARTICULAR
  Filled 2020-11-28: qty 10

## 2020-11-28 NOTE — Progress Notes (Signed)
Safety precautions to be maintained throughout the outpatient stay will include: orient to surroundings, keep bed in low position, maintain call bell within reach at all times, provide assistance with transfer out of bed and ambulation.  

## 2020-11-28 NOTE — Progress Notes (Addendum)
PROVIDER NOTE: Information contained herein reflects review and annotations entered in association with encounter. Interpretation of such information and data should be left to medically-trained personnel. Information provided to patient can be located elsewhere in the medical record under "Patient Instructions". Document created using STT-dictation technology, any transcriptional errors that may result from process are unintentional.    Patient: Sabrina Green  Service Category: Procedure  Provider: Gaspar Cola, MD  DOB: [birth year]  DOS: 11/28/2020  Location: [facility]  MRN: [id number]  Setting: Ambulatory - outpatient  Referring Provider: Sofie Hartigan, MD  Type: Established Patient  Specialty: Interventional Pain Management  PCP: Sofie Hartigan, MD   Primary Reason for Visit: Interventional Pain Management Treatment. CC: Knee Pain (right)  Procedure:          Anesthesia, Analgesia, Anxiolysis:  Type: Therapeutic Intra-Articular Hyalgan Knee Injection #2  Region: Lateral infrapatellar Knee Region Level: Knee Joint Laterality: Right knee  Type: Local Anesthesia Indication(s): Analgesia         Local Anesthetic: Lidocaine 1-2% Route: Infiltration (Kell/IM) IV Access: Declined Sedation: Declined   Position: Sitting   Indications: 1. Chronic knee pain (Right)   2. Osteoarthritis of knee (Right)   3. Chronic anticoagulation (Eliquis)    Pain Score: Pre-procedure: 0-No pain/10 Post-procedure: 0-No pain/10   Post-Procedure Evaluation  Procedure (10/26/2020): Therapeutic right IA Hyalgan knee injection #1 + diagnostic left lumbar facet block #2 under fluoroscopic guidance and IV sedation Pre-procedure pain level: 6/10 Post-procedure: 0/10 (100% relief)  Sedation: Sedation provided.  Effectiveness during initial hour after procedure(Ultra-Short Term Relief): 100 %.  Local anesthetic used: Long-acting (4-6 hours) Effectiveness: Defined as any analgesic  benefit obtained secondary to the administration of local anesthetics. This carries significant diagnostic value as to the etiological location, or anatomical origin, of the pain. Duration of benefit is expected to coincide with the duration of the local anesthetic used.  Effectiveness during initial 4-6 hours after procedure(Short-Term Relief): 100 %.  Long-term benefit: Defined as any relief past the pharmacologic duration of the local anesthetics.  Effectiveness past the initial 6 hours after procedure(Long-Term Relief): 75 %.  Current benefits: Defined as benefit that persist at this time.   Analgesia:  The patient indicates currently experiencing an ongoing 75% relief of her low back pain and knee pain. Function: Sabrina Green reports improvement in function ROM: Sabrina Green reports improvement in ROM  Pre-op H&P Assessment:  Sabrina Green is a 75 y.o. (year old), female patient, seen today for interventional treatment. 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 Sabrina Green has a current medication list which includes the following prescription(s): abatacept, allopurinol, ascorbic acid, atomoxetine, atorvastatin, buspirone, chlorthalidone, cholecalciferol, vitamin b-12, eliquis, fluticasone, folic acid, gabapentin, hydrocodone-acetaminophen, labetalol, losartan, lubiprostone, methotrexate, methotrexate, nortriptyline, omeprazole, oxycodone, pantoprazole, and tizanidine. Her primarily concern today is the Knee Pain (right)  Initial Vital Signs:  Pulse/HCG Rate: 83  Temp: (!) 97.1 F (36.2 C) Resp: 16 BP: (!) 155/91 SpO2: 100 %  BMI: Estimated body mass index is 40.94 kg/m as  calculated from the following:   Height as of this encounter: 5\' 5"  (1.651 m).   Weight as of this encounter: 246 lb (111.6 kg).  Risk Assessment: Allergies: Reviewed. She is allergic to penicillins.  Allergy Precautions: None required  Coagulopathies: Reviewed. None identified.  Blood-thinner therapy: None at this time Active Infection(s): Reviewed. None identified. Sabrina Green is afebrile  Site Confirmation: Sabrina Green was asked to confirm the procedure and laterality before marking the site Procedure checklist: Completed Consent: Before the procedure and under the influence of no sedative(s), amnesic(s), or anxiolytics, the patient was informed of the treatment options, risks and possible complications. To fulfill our ethical and legal obligations, as recommended by the American Medical Association's Code of Ethics, I have informed the patient of my clinical impression; the nature and purpose of the treatment or procedure; the risks, benefits, and possible complications of the intervention; the alternatives, including doing nothing; the risk(s) and benefit(s) of the alternative treatment(s) or procedure(s); and the risk(s) and benefit(s) of doing nothing. The patient was provided information about the general risks and possible complications associated with the procedure. These may include, but are not limited to: failure to achieve desired goals, infection, bleeding, organ or nerve damage, allergic reactions, paralysis, and death. In addition, the patient was informed of those risks and complications associated to the procedure, such as failure to decrease pain; infection; bleeding; organ or nerve damage with subsequent damage to sensory, motor, and/or autonomic systems, resulting in permanent pain, numbness, and/or weakness of one or several areas of the body; allergic reactions; (i.e.: anaphylactic reaction); and/or death. Furthermore, the patient was informed of those risks and complications  associated with the medications. These include, but are not limited to: allergic reactions (i.e.: anaphylactic or anaphylactoid reaction(s)); adrenal axis suppression; blood sugar elevation that in diabetics may result in ketoacidosis or comma; water retention that in patients with history of congestive heart failure may result in shortness of breath, pulmonary edema, and decompensation with resultant heart failure; weight gain; swelling or edema; medication-induced neural toxicity; particulate matter embolism and blood vessel occlusion with resultant organ, and/or nervous system infarction; and/or aseptic necrosis of one or more joints. Finally, the patient was informed that Medicine is not an exact science; therefore, there is also the possibility of unforeseen or unpredictable risks and/or possible complications that may result in a catastrophic outcome. The patient indicated having understood very clearly. We have given the patient no guarantees and we have made no promises. Enough time was given to the patient to ask questions, all of which were answered to the patient's satisfaction. Ms. Mcneese has indicated that she wanted to continue with the procedure. Attestation: I, the ordering provider, attest that I have discussed with the patient the benefits, risks, side-effects, alternatives, likelihood of achieving goals, and potential problems during recovery for the procedure that I have provided informed consent. Date  Time: 11/28/2020  9:13 AM  Pre-Procedure Preparation:  Monitoring: As per clinic protocol. Respiration, ETCO2, SpO2, BP, heart rate and rhythm monitor placed and checked for adequate function Safety Precautions: Patient was assessed for positional comfort and pressure points before starting the procedure. Time-out: I initiated and conducted the "Time-out" before starting the procedure, as per protocol. The patient was asked to participate by confirming the accuracy of the "Time Out"  information. Verification of the correct person, site, and procedure were performed and confirmed by me, the nursing staff, and the patient. "Time-out" conducted as per Joint Commission's Universal Protocol (UP.01.01.01). Time: 0930  Description of Procedure:          Target Area: Knee Joint Approach: Just above the Lateral tibial plateau, lateral to the infrapatellar tendon. Area Prepped: Entire knee area, from the mid-thigh to the mid-shin. DuraPrep (Iodine Povacrylex [  0.7% available iodine] and Isopropyl Alcohol, 74% w/w) Safety Precautions: Aspiration looking for blood return was conducted prior to all injections. At no point did we inject any substances, as a needle was being advanced. No attempts were made at seeking any paresthesias. Safe injection practices and needle disposal techniques used. Medications properly checked for expiration dates. SDV (single dose vial) medications used. Description of the Procedure: Protocol guidelines were followed. The patient was placed in position over the fluoroscopy table. The target area was identified and the area prepped in the usual manner. Skin & deeper tissues infiltrated with local anesthetic. Appropriate amount of time allowed to pass for local anesthetics to take effect. The procedure needles were then advanced to the target area. Proper needle placement secured. Negative aspiration confirmed. Solution injected in intermittent fashion, asking for systemic symptoms every 0.5cc of injectate. The needles were then removed and the area cleansed, making sure to leave some of the prepping solution back to take advantage of its long term bactericidal properties. Vitals:   11/28/20 0913  BP: (!) 155/91  Pulse: 83  Resp: 16  Temp: (!) 97.1 F (36.2 C)  SpO2: 100%  Weight: 246 lb (111.6 kg)  Height: 5\' 5"  (1.651 m)    Start Time: 0930 hrs. End Time: 0931 hrs. Materials:  Needle(s) Type: Regular needle Gauge: 25G Length: 1.5-in Medication(s):  Please see orders for medications and dosing details.  Imaging Guidance:          Type of Imaging Technique: None used Indication(s): N/A Exposure Time: No patient exposure Contrast: None used. Fluoroscopic Guidance: N/A Ultrasound Guidance: N/A Interpretation: N/A  Antibiotic Prophylaxis:   Anti-infectives (From admission, onward)   None     Indication(s): None identified  Post-operative Assessment:  Post-procedure Vital Signs:  Pulse/HCG Rate: 83  Temp: (!) 97.1 F (36.2 C) Resp: 16 BP: (!) 155/91 SpO2: 100 %  EBL: None  Complications: No immediate post-treatment complications observed by team, or reported by patient.  Note: The patient tolerated the entire procedure well. A repeat set of vitals were taken after the procedure and the patient was kept under observation following institutional policy, for this type of procedure. Post-procedural neurological assessment was performed, showing return to baseline, prior to discharge. The patient was provided with post-procedure discharge instructions, including a section on how to identify potential problems. Should any problems arise concerning this procedure, the patient was given instructions to immediately contact us, at any time, without hesitation. In any case, we plan to contact the patient by telephone for a follow-up status report regarding this interventional procedure.  Comments:  No additional relevant information.  Plan of Care  Orders:  Orders Placed This Encounter  Procedures  . KNEE INJECTION    Hyalgan knee injection to be done by MD.    Scheduling Instructions:     Procedure: Intra-articular Hyalgan Knee injection #2     Side(s): Right Knee     Sedation: None     Timeframe: Today    Order Specific Question:   Where will this procedure be performed?    Answer:   [facility]  . KNEE INJECTION    Hyalgan knee injection. Please order Hyalgan.    Standing Status:   Future    Standing Expiration  Date:   12/29/2020    Scheduling Instructions:     Procedure: Intra-articular Hyalgan Knee injection #3     Side: Right-sided     Sedation: None     Timeframe: in two (2)  weeks    Order Specific Question:   Where will this procedure be performed?    Answer:   [facility]  . Informed Consent Details: Physician/Practitioner Attestation; Transcribe to consent form and obtain patient signature    Note: Always confirm laterality of pain with Ms. Sweezey, before procedure. Transcribe to consent form and obtain patient signature.    Order Specific Question:   Physician/Practitioner attestation of informed consent for procedure/surgical case    Answer:   I, the physician/practitioner, attest that I have discussed with the patient the benefits, risks, side effects, alternatives, likelihood of achieving goals and potential problems during recovery for the procedure that I have provided informed consent.    Order Specific Question:   Procedure    Answer:   Therapeutic, right sided, intra-articular Hyalgan knee injection    Order Specific Question:   Physician/Practitioner performing the procedure    Answer:   Afton Lavalle A. Dossie Arbour, MD    Order Specific Question:   Indication/Reason    Answer:   Chronic right knee pain secondary to primary osteoarthritis of the knee  . Care order/instruction: Please confirm that the patient has stopped the Eliquis (Apixaban) x 3 days prior to procedure or surgery.    Please confirm that the patient has stopped the Eliquis (Apixaban) x 3 days prior to procedure or surgery.    Standing Status:   Standing    Number of Occurrences:   1  . Blood Thinner Instructions to Nursing    Always make sure patient has clearance from prescribing physician to stop blood thinners for interventional therapies. If the patient requires a Lovenox-bridge therapy, make sure arrangements are made to institute it with the assistance of the PCP.    Scheduling Instructions:     Have Ms.  Chavira stop the Eliquis (Apixaban) x 3 days prior to procedure or surgery.  . Bleeding precautions    Standing Status:   Standing    Number of Occurrences:   1   Chronic Opioid Analgesic:  No opioid analgesics prescribed by our practice    Medications ordered for procedure: Meds ordered this encounter  Medications  . lidocaine (PF) (XYLOCAINE) 1 % injection 5 mL  . ropivacaine (PF) 2 mg/mL (0.2%) (NAROPIN) injection 5 mL  . Sodium Hyaluronate SOSY 2 mL   Medications administered: We administered lidocaine (PF), ropivacaine (PF) 2 mg/mL (0.2%), and Sodium Hyaluronate.  See the medical record for exact dosing, route, and time of administration.  Follow-up plan:   Return in about 2 weeks (around 12/12/2020) for Procedure (no sedation): (R) Hyalgan #3, (Blood Thinner Protocol).      Interventional Therapies  Risk  Complexity Considerations:   Estimated body mass index is 40.94 kg/m as calculated from the following:   Height as of 11/28/20: 5\' 5"  (1.651 m).   Weight as of 11/28/20: 246 lb (111.6 kg). 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  NO RFA until BMI <35. 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  Refers needing an open MRI. History of generalized anxiety and panic attacks.   Planned  Pending:   Pending further evaluation   Under consideration:   Diagnosticbilateral genicular nerve block #1 Possible bilateral genicular nerve RFA Diagnosticleft side LESI Diagnostic bilateral lumbar facet RFA Diagnosticright suprascapular NB Possible right suprascapular RFA   Completed:   Palliative bilateral IA steroid knee injection x2(06/01/2019) Diagnosticright lumbar facet MBB x1(02/17/2018)  Diagnosticleft lumbar facet MBB x1(02/17/2018)  Therapeutic right Hyalgan knee inj. x7 (11/28/2020) (fluoroscopy required)  Therapeutic left  Hyalgan knee inj. x6 (09/09/2019) (fluoroscopy required)  Diagnostic bilateral glenohumeral Joint (shoulder), AC joint, & subdeltoid  bursaInjectionx1(08/20/2018)    Therapeutic  Palliative (PRN) options:   Palliative right IA steroid knee injection #3  Palliative left IA steroid knee injection #3 Diagnosticright lumbar facet MBB #2 Diagnosticleft lumbar facet MBB #2 Therapeutic right Hyalgan knee inj. #8 (fluoroscopy required)  Therapeutic left Hyalgan knee inj. #7 (fluoroscopy required)  Diagnostic  right glenohumeral Joint (shoulder), AC joint, & subdeltoid bursaInjection#2 Diagnostic  left glenohumeral Joint (shoulder), AC joint, & subdeltoid bursaInjection#2    Recent Visits Date Type Provider Dept  10/26/20 Procedure visit Milinda Pointer, MD [facility]  10/10/20 Office Visit Milinda Pointer, MD [facility]  Showing recent visits within past 90 days and meeting all other requirements Today's Visits Date Type Provider Dept  11/28/20 Procedure visit Milinda Pointer, MD [facility]  Showing today's visits and meeting all other requirements Future Appointments Date Type Provider Dept  12/07/20 Appointment Milinda Pointer, MD [facility]  Showing future appointments within next 90 days and meeting all other requirements  Disposition: Discharge home  Discharge (Date  Time): 11/28/2020; 0933 hrs.   Primary Care Physician: Sofie Hartigan, MD Location: [facility] Note by: Gaspar Cola, MD Date: 11/28/2020; Time: 3:32 PM  Disclaimer:  Medicine is not an Chief Strategy Officer. The only guarantee in medicine is that nothing is guaranteed. It is important to note that the decision to proceed with this intervention was based on the information collected from the patient. The Data and conclusions were drawn from the patient's questionnaire, the interview, and the physical examination. Because the information was provided in large part by the patient, it cannot be guaranteed that it has not been purposely or unconsciously  manipulated. Every effort has been made to obtain as much relevant data as possible for this evaluation. It is important to note that the conclusions that lead to this procedure are derived in large part from the available data. Always take into account that the treatment will also be dependent on availability of resources and existing treatment guidelines, considered by other Pain Management Practitioners as being common knowledge and practice, at the time of the intervention. For Medico-Legal purposes, it is also important to point out that variation in procedural techniques and pharmacological choices are the acceptable norm. The indications, contraindications, technique, and results of the above procedure should only be interpreted and judged by a Board-Certified Interventional Pain Specialist with extensive familiarity and expertise in the same exact procedure and technique.

## 2020-11-28 NOTE — Patient Instructions (Signed)
____________________________________________________________________________________________  Post-Procedure Discharge Instructions  Instructions:  Apply ice:   Purpose: This will minimize any swelling and discomfort after procedure.   When: Day of procedure, as soon as you get home.  How: Fill a plastic sandwich bag with crushed ice. Cover it with a small towel and apply to injection site.  How long: (15 min on, 15 min off) Apply for 15 minutes then remove x 15 minutes.  Repeat sequence on day of procedure, until you go to bed.  Apply heat:   Purpose: To treat any soreness and discomfort from the procedure.  When: Starting the next day after the procedure.  How: Apply heat to procedure site starting the day following the procedure.  How long: May continue to repeat daily, until discomfort goes away.  Food intake: Start with clear liquids (like water) and advance to regular food, as tolerated.   Physical activities: Keep activities to a minimum for the first 8 hours after the procedure. After that, then as tolerated.  Driving: If you have received any sedation, be responsible and do not drive. You are not allowed to drive for 24 hours after having sedation.  Blood thinner: (Applies only to those taking blood thinners) You may restart your blood thinner 6 hours after your procedure.  Insulin: (Applies only to Diabetic patients taking insulin) As soon as you can eat, you may resume your normal dosing schedule.  Infection prevention: Keep procedure site clean and dry. Shower daily and clean area with soap and water.  Post-procedure Pain Diary: Extremely important that this be done correctly and accurately. Recorded information will be used to determine the next step in treatment. For the purpose of accuracy, follow these rules:  Evaluate only the area treated. Do not report or include pain from an untreated area. For the purpose of this evaluation, ignore all other areas of pain,  except for the treated area.  After your procedure, avoid taking a long nap and attempting to complete the pain diary after you wake up. Instead, set your alarm clock to go off every hour, on the hour, for the initial 8 hours after the procedure. Document the duration of the numbing medicine, and the relief you are getting from it.  Do not go to sleep and attempt to complete it later. It will not be accurate. If you received sedation, it is likely that you were given a medication that may cause amnesia. Because of this, completing the diary at a later time may cause the information to be inaccurate. This information is needed to plan your care.  Follow-up appointment: Keep your post-procedure follow-up evaluation appointment after the procedure (usually 2 weeks for most procedures, 6 weeks for radiofrequencies). DO NOT FORGET to bring you pain diary with you.   Expect: (What should I expect to see with my procedure?)  From numbing medicine (AKA: Local Anesthetics): Numbness or decrease in pain. You may also experience some weakness, which if present, could last for the duration of the local anesthetic.  Onset: Full effect within 15 minutes of injected.  Duration: It will depend on the type of local anesthetic used. On the average, 1 to 8 hours.   From steroids (Applies only if steroids were used): Decrease in swelling or inflammation. Once inflammation is improved, relief of the pain will follow.  Onset of benefits: Depends on the amount of swelling present. The more swelling, the longer it will take for the benefits to be seen. In some cases, up to 10 days.    Duration: Steroids will stay in the system x 2 weeks. Duration of benefits will depend on multiple posibilities including persistent irritating factors.  Side-effects: If present, they may typically last 2 weeks (the duration of the steroids).  Frequent: Cramps (if they occur, drink Gatorade and take over-the-counter Magnesium 450-500 mg  once to twice a day); water retention with temporary weight gain; increases in blood sugar; decreased immune system response; increased appetite.  Occasional: Facial flushing (red, warm cheeks); mood swings; menstrual changes.  Uncommon: Long-term decrease or suppression of natural hormones; bone thinning. (These are more common with higher doses or more frequent use. This is why we prefer that our patients avoid having any injection therapies in other practices.)   Very Rare: Severe mood changes; psychosis; aseptic necrosis.  From procedure: Some discomfort is to be expected once the numbing medicine wears off. This should be minimal if ice and heat are applied as instructed.  Call if: (When should I call?)  You experience numbness and weakness that gets worse with time, as opposed to wearing off.  New onset bowel or bladder incontinence. (Applies only to procedures done in the spine)  Emergency Numbers:  Durning business hours (Monday - Thursday, 8:00 AM - 4:00 PM) (Friday, 9:00 AM - 12:00 Noon): [phone]  After hours: [phone]  NOTE: If you are having a problem and are unable connect with, or to talk to a provider, then go to your nearest urgent care or emergency department. If the problem is serious and urgent, please call 911. ____________________________________________________________________________________________   ____________________________________________________________________________________________  Blood Thinners  IMPORTANT NOTICE:  If you take any of these, make sure to notify the nursing staff.  Failure to do so may result in injury.  Recommended time intervals to stop and restart blood-thinners, before & after invasive procedures  Generic Name Brand Name Stop Time. Must be stopped at least this long before procedures. After procedures, wait at least this long before re-starting.  Abciximab Reopro 15 days 2 hrs  Alteplase Activase 10 days 10 days   Anagrelide Agrylin    Apixaban Eliquis 3 days 6 hrs  Cilostazol Pletal 3 days 5 hrs  Clopidogrel Plavix 7-10 days 2 hrs  Dabigatran Pradaxa 5 days 6 hrs  Dalteparin Fragmin 24 hours 4 hrs  Dipyridamole Aggrenox 11days 2 hrs  Edoxaban Lixiana; Savaysa 3 days 2 hrs  Enoxaparin  Lovenox 24 hours 4 hrs  Eptifibatide Integrillin 8 hours 2 hrs  Fondaparinux  Arixtra 72 hours 12 hrs  Hydroxychloroquine Plaquenil 11 days   Prasugrel Effient 7-10 days 6 hrs  Reteplase Retavase 10 days 10 days  Rivaroxaban Xarelto 3 days 6 hrs  Ticagrelor Brilinta 5-7 days 6 hrs  Ticlopidine Ticlid 10-14 days 2 hrs  Tinzaparin Innohep 24 hours 4 hrs  Tirofiban Aggrastat 8 hours 2 hrs  Warfarin Coumadin 5 days 2 hrs   Other medications with blood-thinning effects  Product indications Generic (Brand) names Note  Cholesterol Lipitor Stop 4 days before procedure  Blood thinner (injectable) Heparin (LMW or LMWH Heparin) Stop 24 hours before procedure  Cancer Ibrutinib (Imbruvica) Stop 7 days before procedure  Malaria/Rheumatoid Hydroxychloroquine (Plaquenil) Stop 11 days before procedure  Thrombolytics  10 days before or after procedures   Over-the-counter (OTC) Products with blood-thinning effects  Product Common names Stop Time  Aspirin > 325 mg Goody Powders, Excedrin, etc. 11 days  Aspirin ? 81 mg  7 days  Fish oil  4 days  Garlic supplements  7 days  Ginkgo  biloba  36 hours  Ginseng  24 hours  NSAIDs Ibuprofen, Naprosyn, etc. 3 days  Vitamin E  4 days   ____________________________________________________________________________________________  ____________________________________________________________________________________________  Preparing for your procedure (without sedation)  Procedure appointments are limited to planned procedures: . No Prescription Refills. . No disability issues will be discussed. . No medication changes will be discussed.  Instructions: . Oral Intake: Do not  eat or drink anything for at least 6 hours prior to your procedure. (Exception: Blood Pressure Medication. See below.) . Transportation: Unless otherwise stated by your physician, you may drive yourself after the procedure. . Blood Pressure Medicine: Do not forget to take your blood pressure medicine with a sip of water the morning of the procedure. If your Diastolic (lower reading)is above 100 mmHg, elective cases will be cancelled/rescheduled. . Blood thinners: These will need to be stopped for procedures. Notify our staff if you are taking any blood thinners. Depending on which one you take, there will be specific instructions on how and when to stop it. . Diabetics on insulin: Notify the staff so that you can be scheduled 1st case in the morning. If your diabetes requires high dose insulin, take only  of your normal insulin dose the morning of the procedure and notify the staff that you have done so. . Preventing infections: Shower with an antibacterial soap the morning of your procedure.  . Build-up your immune system: Take 1000 mg of Vitamin C with every meal (3 times a day) the day prior to your procedure. Marland Kitchen Antibiotics: Inform the staff if you have a condition or reason that requires you to take antibiotics before dental procedures. . Pregnancy: If you are pregnant, call and cancel the procedure. . Sickness: If you have a cold, fever, or any active infections, call and cancel the procedure. . Arrival: You must be in the facility at least 30 minutes prior to your scheduled procedure. . Children: Do not bring any children with you. . Dress appropriately: Bring dark clothing that you would not mind if they get stained. . Valuables: Do not bring any jewelry or valuables.  Reasons to call and reschedule or cancel your procedure: (Following these recommendations will minimize the risk of a serious complication.) . Surgeries: Avoid having procedures within 2 weeks of any surgery. (Avoid for 2 weeks  before or after any surgery). . Flu Shots: Avoid having procedures within 2 weeks of a flu shots or . (Avoid for 2 weeks before or after immunizations). . Barium: Avoid having a procedure within 7-10 days after having had a radiological study involving the use of radiological contrast. (Myelograms, Barium swallow or enema study). . Heart attacks: Avoid any elective procedures or surgeries for the initial 6 months after a "Myocardial Infarction" (Heart Attack). . Blood thinners: It is imperative that you stop these medications before procedures. Let us know if you if you take any blood thinner.  . Infection: Avoid procedures during or within two weeks of an infection (including chest colds or gastrointestinal problems). Symptoms associated with infections include: Localized redness, fever, chills, night sweats or profuse sweating, burning sensation when voiding, cough, congestion, stuffiness, runny nose, sore throat, diarrhea, nausea, vomiting, cold or Flu symptoms, recent or current infections. It is specially important if the infection is over the area that we intend to treat. Marland Kitchen Heart and lung problems: Symptoms that may suggest an active cardiopulmonary problem include: cough, chest pain, breathing difficulties or shortness of breath, dizziness, ankle swelling, uncontrolled high or unusually low blood  pressure, and/or palpitations. If you are experiencing any of these symptoms, cancel your procedure and contact your primary care physician for an evaluation.  Remember:  Regular Business hours are:  Monday to Thursday 8:00 AM to 4:00 PM  Provider's Schedule: Milinda Pointer, MD:  Procedure days: Tuesday and Thursday 7:30 AM to 4:00 PM  Gillis Santa, MD:  Procedure days: Monday and Wednesday 7:30 AM to 4:00 PM ____________________________________________________________________________________________

## 2020-11-29 ENCOUNTER — Telehealth: Payer: Self-pay

## 2020-11-29 NOTE — Telephone Encounter (Signed)
Post procedure phone call.  Patient states she is doing fine.  

## 2020-12-04 ENCOUNTER — Telehealth: Payer: Self-pay | Admitting: Cardiovascular Disease

## 2020-12-04 NOTE — Telephone Encounter (Signed)
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    Procedure: colonoscopy Date of procedure: 12/12/20  CrCl 87mL/min using adjusted body weight Platelet count 139K  Per office protocol, patient can hold Eliquis for 1 day prior to procedure. Resume as soon as safely possible after.

## 2020-12-04 NOTE — Telephone Encounter (Signed)
   [facility] Pre-operative Risk Assessment    Patient Name: Sabrina Green  DOB: 1946/04/27  MRN: [id number]   HEARTCARE STAFF: - Please ensure there is not already an duplicate clearance open for this procedure. - Under Visit Info/Reason for Call, type in Other and utilize the format Clearance MM/DD/YY or Clearance TBD. Do not use dashes or single digits. - If request is for dental extraction, please clarify the # of teeth to be extracted.  Request for surgical clearance:  1. What type of surgery is being performed? Colonoscopy   2. When is this surgery scheduled? 12/12/20  3. What type of clearance is required (medical clearance vs. Pharmacy clearance to hold med vs. Both)? both  4. Are there any medications that need to be held prior to surgery and how long? Eliquis instructions  5. Practice name and name of physician performing surgery? [facility] - Dr Andrey Farmer  6. What is the office phone number? [phone]   7.   What is the office fax number? [phone]  8.   Anesthesia type (None, local, MAC, general) ? general   Ace Gins 12/04/2020, 2:48 PM  _________________________________________________________________   (provider comments below)

## 2020-12-05 NOTE — Telephone Encounter (Signed)
Left message to call back and ask to speak with pre-op team.  Darreld Mclean, PA-C 12/05/2020 9:33 AM

## 2020-12-05 NOTE — Telephone Encounter (Signed)
    Sabrina Green DOB:  1946-04-06  MRN:  [id number]   Primary Cardiologist: Ida Rogue, MD  Chart reviewed as part of pre-operative protocol coverage. Patient was last seen by Dr. Rockey Situ in 09/2020. She was contacted today for further pre-op evaluation and reported doing well from a cardiac standpoint since last visit. No chest pain, shortness of breath, orthopnea, PND, significant palpitations, lightheadedness/dizziness, syncope. Able to complete >4.0 METS without any problems. Given past medical history and time since last visit, based on ACC/AHA guidelines, Zarielle Cea would be at acceptable risk for the planned procedure without further cardiovascular testing.   Per Pharmacy and office protocol, patient can hold Eliquis for 1 day prior to procedure. This should be restarted as soon as safely possible following procedure.  I will route this recommendation to the requesting party via Epic fax function and remove from pre-op pool.  Please call with questions.  Darreld Mclean, PA-C 12/05/2020, 10:00 AM

## 2020-12-06 NOTE — Telephone Encounter (Signed)
Ambri is returning Callie's call. Please advise.

## 2020-12-06 NOTE — Telephone Encounter (Signed)
    Sabrina Green DOB:  1945/12/18  MRN:  [id number]   Primary Cardiologist: Ida Rogue, MD  Chart reviewed as part of pre-operative protocol coverage.  Returned patient's call and clarified recommendations for upcoming colonoscopy.   I will remove from pre-op pool.   Abigail Butts, PA-C 12/06/2020, 2:00 PM

## 2020-12-06 NOTE — Progress Notes (Deleted)
Canceled visit on day of procedure

## 2020-12-07 ENCOUNTER — Ambulatory Visit: Payer: Medicare Other | Admitting: Pain Medicine

## 2020-12-07 ENCOUNTER — Other Ambulatory Visit: Payer: Self-pay | Admitting: Pain Medicine

## 2020-12-07 DIAGNOSIS — G8929 Other chronic pain: Secondary | ICD-10-CM

## 2020-12-07 NOTE — Progress Notes (Signed)
The patient called to cancel her right intra-articular Hyalgan knee injection, on the day of the procedure, stating that she was doing well and did not need injection.

## 2020-12-12 ENCOUNTER — Ambulatory Visit
Admission: RE | Admit: 2020-12-12 | Discharge: 2020-12-12 | Disposition: A | Discharge status: Home / Self Care | Payer: Medicare Other | Source: Ambulatory Visit | Attending: Gastroenterology | Admitting: Gastroenterology

## 2020-12-12 ENCOUNTER — Ambulatory Visit: Payer: Medicare Other | Admitting: Certified Registered Nurse Anesthetist

## 2020-12-12 ENCOUNTER — Other Ambulatory Visit: Payer: Self-pay

## 2020-12-12 ENCOUNTER — Encounter
Admission: RE | Disposition: A | Discharge status: Home / Self Care | Payer: Self-pay | Source: Ambulatory Visit | Attending: Gastroenterology

## 2020-12-12 DIAGNOSIS — Z7901 Long term (current) use of anticoagulants: Secondary | ICD-10-CM | POA: Diagnosis not present

## 2020-12-12 DIAGNOSIS — D509 Iron deficiency anemia, unspecified: Secondary | ICD-10-CM | POA: Diagnosis not present

## 2020-12-12 DIAGNOSIS — Z79899 Other long term (current) drug therapy: Secondary | ICD-10-CM | POA: Insufficient documentation

## 2020-12-12 DIAGNOSIS — N183 Chronic kidney disease, stage 3 unspecified: Secondary | ICD-10-CM | POA: Diagnosis not present

## 2020-12-12 DIAGNOSIS — Z86718 Personal history of other venous thrombosis and embolism: Secondary | ICD-10-CM | POA: Insufficient documentation

## 2020-12-12 DIAGNOSIS — K449 Diaphragmatic hernia without obstruction or gangrene: Secondary | ICD-10-CM | POA: Insufficient documentation

## 2020-12-12 DIAGNOSIS — K219 Gastro-esophageal reflux disease without esophagitis: Secondary | ICD-10-CM | POA: Diagnosis not present

## 2020-12-12 DIAGNOSIS — K2970 Gastritis, unspecified, without bleeding: Secondary | ICD-10-CM | POA: Diagnosis not present

## 2020-12-12 DIAGNOSIS — D123 Benign neoplasm of transverse colon: Secondary | ICD-10-CM | POA: Insufficient documentation

## 2020-12-12 DIAGNOSIS — K640 First degree hemorrhoids: Secondary | ICD-10-CM | POA: Insufficient documentation

## 2020-12-12 DIAGNOSIS — M069 Rheumatoid arthritis, unspecified: Secondary | ICD-10-CM | POA: Diagnosis not present

## 2020-12-12 DIAGNOSIS — I129 Hypertensive chronic kidney disease with stage 1 through stage 4 chronic kidney disease, or unspecified chronic kidney disease: Secondary | ICD-10-CM | POA: Diagnosis not present

## 2020-12-12 DIAGNOSIS — Z880 Allergy status to penicillin: Secondary | ICD-10-CM | POA: Diagnosis not present

## 2020-12-12 HISTORY — DX: Other pulmonary embolism without acute cor pulmonale: I26.99

## 2020-12-12 HISTORY — PX: COLONOSCOPY WITH PROPOFOL: SHX5780

## 2020-12-12 HISTORY — PX: ESOPHAGOGASTRODUODENOSCOPY: SHX5428

## 2020-12-12 SURGERY — EGD (ESOPHAGOGASTRODUODENOSCOPY)
Anesthesia: General

## 2020-12-12 MED ORDER — SODIUM CHLORIDE 0.9 % IV SOLN: INTRAVENOUS | Status: DC

## 2020-12-12 MED ORDER — PROPOFOL 10 MG/ML IV BOLUS
INTRAVENOUS | Status: DC | PRN
  Administered 2020-12-12: 60 mg via INTRAVENOUS

## 2020-12-12 MED ORDER — PROPOFOL 500 MG/50ML IV EMUL
INTRAVENOUS | Status: AC
  Filled 2020-12-12: qty 150

## 2020-12-12 MED ORDER — PROPOFOL 500 MG/50ML IV EMUL
INTRAVENOUS | Status: DC | PRN
  Administered 2020-12-12: 150 ug/kg/min via INTRAVENOUS

## 2020-12-12 MED ORDER — PHENYLEPHRINE HCL (PRESSORS) 10 MG/ML IV SOLN
INTRAVENOUS | Status: DC | PRN
  Administered 2020-12-12: 50 ug via INTRAVENOUS
  Administered 2020-12-12: 100 ug via INTRAVENOUS

## 2020-12-12 MED ORDER — LIDOCAINE HCL (CARDIAC) PF 100 MG/5ML IV SOSY
PREFILLED_SYRINGE | INTRAVENOUS | Status: DC | PRN
  Administered 2020-12-12: 100 mg via INTRAVENOUS

## 2020-12-12 MED ORDER — GLYCOPYRROLATE 0.2 MG/ML IJ SOLN
INTRAMUSCULAR | Status: DC | PRN
  Administered 2020-12-12: .2 mg via INTRAVENOUS

## 2020-12-12 NOTE — Anesthesia Preprocedure Evaluation (Signed)
Anesthesia Evaluation  Patient identified by MRN, date of birth, ID band Patient awake    Reviewed: Allergy & Precautions, H&P , NPO status , Patient's Chart, lab work & pertinent test results  History of Anesthesia Complications (+) PROLONGED EMERGENCE and history of anesthetic complications  Airway Mallampati: III  TM Distance: >3 FB Neck ROM: full    Dental  (+) Chipped, Poor Dentition, Caps, Dental Advidsory Given   Pulmonary neg shortness of breath, asthma , neg COPD, neg recent URI,           Cardiovascular Exercise Tolerance: Good hypertension, (-) angina+ DVT  (-) Past MI, (-) Cardiac Stents and (-) DOE (-) dysrhythmias (-) Valvular Problems/Murmurs     Neuro/Psych neg Seizures PSYCHIATRIC DISORDERS Anxiety  Neuromuscular disease    GI/Hepatic Neg liver ROS, GERD  Medicated and Controlled,  Endo/Other  neg diabetesMorbid obesity  Renal/GU CRFRenal disease     Musculoskeletal  (+) Arthritis , Fibromyalgia -  Abdominal   Peds  Hematology  (+) Blood dyscrasia, anemia ,   Anesthesia Other Findings Past Medical History: No date: Anemia     Comment:  vitamin b and d deficiency No date: Anxiety No date: Arthritis     Comment:  osteoarthrits No date: Chronic pain syndrome No date: CKD (chronic kidney disease) stage 3, GFR 30-59 ml/min No date: Complication of anesthesia     Comment:  difficulty waking up No date: DDD (degenerative disc disease), cervical No date: Fibromyalgia     Comment:  not accurate, per patient No date: GERD (gastroesophageal reflux disease) No date: Gout No date: Hearing loss 1987: History of kidney stones No date: Hypertension No date: Rheumatoid arthritis ([place])     Comment:  orencia infusions No date: Vitamin B12 deficiency No date: Vitamin D deficiency  Past Surgical History: No date: ABDOMINAL HYSTERECTOMY No date: ABDOMINAL HYSTERECTOMY 1989: ANKLE FRACTURE SURGERY;  Right     Comment:  [place], [place].   METAL IN ANKLE 02/09/2019: BREAST BIOPSY; Right     Comment:  affirm bx of calcs, ribbon marker, path pending No date: CESAREAN SECTION No date: CHOLECYSTECTOMY 2015: EYE SURGERY; Bilateral     Comment:  cataract surgery, [place], [place] No date: KNEE ARTHROSCOPY No date: TONSILLECTOMY No date: TUBAL LIGATION     Reproductive/Obstetrics negative OB ROS                             Anesthesia Physical  Anesthesia Plan  ASA: III  Anesthesia Plan: General   Post-op Pain Management:    Induction: Intravenous  PONV Risk Score and Plan: TIVA and Propofol infusion  Airway Management Planned: Natural Airway and Nasal Cannula  Additional Equipment:   Intra-op Plan:   Post-operative Plan: Extubation in OR  Informed Consent: I have reviewed the patients History and Physical, chart, labs and discussed the procedure including the risks, benefits and alternatives for the proposed anesthesia with the patient or authorized representative who has indicated his/her understanding and acceptance.     Dental Advisory Given  Plan Discussed with: Anesthesiologist, CRNA and Surgeon  Anesthesia Plan Comments: (Patient adamantly refuses spinal and request GA for case  Patient consented for risks of anesthesia including but not limited to:  - adverse reactions to medications - damage to teeth, lips or other oral mucosa - sore throat or hoarseness - Damage to heart, brain, lungs or loss of life  Patient voiced understanding.)  Anesthesia Quick Evaluation  

## 2020-12-12 NOTE — Transfer of Care (Signed)
Immediate Anesthesia Transfer of Care Note  Patient: Sabrina Green  Procedure(s) Performed: ESOPHAGOGASTRODUODENOSCOPY (EGD) (N/A ) COLONOSCOPY WITH PROPOFOL (N/A )  Patient Location: PACU  Anesthesia Type:General  Level of Consciousness: drowsy  Airway & Oxygen Therapy: Patient Spontanous Breathing  Post-op Assessment: Report given to RN and Post -op Vital signs reviewed and stable  Post vital signs: Reviewed and stable  Last Vitals:  Vitals Value Taken Time  BP 111/52 12/12/20 0812  Temp 35.8 C 12/12/20 0811  Pulse 72 12/12/20 0813  Resp 22 12/12/20 0813  SpO2 98 % 12/12/20 0813  Vitals shown include unvalidated device data.  Last Pain:  Vitals:   12/12/20 0811  TempSrc: Temporal  PainSc: Asleep         Complications: No complications documented.

## 2020-12-12 NOTE — Anesthesia Procedure Notes (Signed)
Date/Time: 12/12/2020 7:46 AM Performed by: Lily Peer, Eusebia Grulke, CRNA Pre-anesthesia Checklist: Patient identified, Emergency Drugs available, Suction available and Patient being monitored Patient Re-evaluated:Patient Re-evaluated prior to induction Oxygen Delivery Method: Nasal cannula Induction Type: IV induction

## 2020-12-12 NOTE — Anesthesia Postprocedure Evaluation (Signed)
Anesthesia Post Note  Patient: Adean Milosevic Mceachron  Procedure(s) Performed: ESOPHAGOGASTRODUODENOSCOPY (EGD) (N/A ) COLONOSCOPY WITH PROPOFOL (N/A )  Patient location during evaluation: Endoscopy Anesthesia Type: General Level of consciousness: awake and alert Pain management: pain level controlled Vital Signs Assessment: post-procedure vital signs reviewed and stable Respiratory status: spontaneous breathing, nonlabored ventilation, respiratory function stable and patient connected to nasal cannula oxygen Cardiovascular status: blood pressure returned to baseline and stable Postop Assessment: no apparent nausea or vomiting Anesthetic complications: no   No complications documented.   Last Vitals:  Vitals:   12/12/20 0821 12/12/20 0831  BP: (!) 105/46 104/64  Pulse: 82 80  Resp: 17 17  Temp:    SpO2: 96% 98%    Last Pain:  Vitals:   12/12/20 0831  TempSrc:   PainSc: 0-No pain                 Martha Clan

## 2020-12-12 NOTE — Op Note (Signed)
[facility] Gastroenterology Patient Name: Sabrina Green Procedure Date: 12/12/2020 7:33 AM MRN: [id number] Account #: [id number] Date of Birth: [birth year] Admit Type: Outpatient Age: 75 Room: [facility] ENDO ROOM 1 Gender: Female Note Status: Finalized Procedure:             Upper GI endoscopy Indications:           Iron deficiency anemia, Gastro-esophageal reflux                         disease Providers:             Andrey Farmer MD, MD Medicines:             Monitored Anesthesia Care Complications:         No immediate complications. Estimated blood loss:                         Minimal. Procedure:             Pre-Anesthesia Assessment:                        - Prior to the procedure, a History and Physical was                         performed, and patient medications and allergies were                         reviewed. The patient is competent. The risks and                         benefits of the procedure and the sedation options and                         risks were discussed with the patient. All questions                         were answered and informed consent was obtained.                         Patient identification and proposed procedure were                         verified by the physician, the nurse, the anesthetist                         and the technician in the endoscopy suite. Mental                         Status Examination: alert and oriented. Airway                         Examination: normal oropharyngeal airway and neck                         mobility. Respiratory Examination: clear to                         auscultation. CV Examination: normal. Prophylactic  Antibiotics: The patient does not require prophylactic                         antibiotics. Prior Anticoagulants: The patient has                         taken Eliquis (apixaban), last dose was 2 days prior                         to procedure. ASA Grade  Assessment: III - A patient                         with severe systemic disease. After reviewing the                         risks and benefits, the patient was deemed in                         satisfactory condition to undergo the procedure. The                         anesthesia plan was to use monitored anesthesia care                         (MAC). Immediately prior to administration of                         medications, the patient was re-assessed for adequacy                         to receive sedatives. The heart rate, respiratory                         rate, oxygen saturations, blood pressure, adequacy of                         pulmonary ventilation, and response to care were                         monitored throughout the procedure. The physical                         status of the patient was re-assessed after the                         procedure.                        After obtaining informed consent, the endoscope was                         passed under direct vision. Throughout the procedure,                         the patient's blood pressure, pulse, and oxygen                         saturations were monitored continuously. The Endoscope  was introduced through the mouth, and advanced to the                         second part of duodenum. The upper GI endoscopy was                         accomplished without difficulty. The patient tolerated                         the procedure well. Findings:      A small hiatal hernia was present.      Localized moderate inflammation characterized by erythema was found on       the lesser curvature of the stomach. Biopsies were taken with a cold       forceps for histology. Estimated blood loss was minimal.      The exam of the stomach was otherwise normal.      The examined duodenum was normal. Impression:            - Small hiatal hernia.                        - Gastritis. Biopsied.                         - Normal examined duodenum. Recommendation:        - Perform a colonoscopy today. Procedure Code(s):     --- Professional ---                        [phone], Esophagogastroduodenoscopy, flexible,                         transoral; with biopsy, single or multiple Diagnosis Code(s):     --- Professional ---                        K44.9, Diaphragmatic hernia without obstruction or                         gangrene                        K29.70, Gastritis, unspecified, without bleeding                        D50.9, Iron deficiency anemia, unspecified                        K21.9, Gastro-esophageal reflux disease without                         esophagitis CPT copyright 2019 American Medical Association. All rights reserved. The codes documented in this report are preliminary and upon coder review may  be revised to meet current compliance requirements. Andrey Farmer MD, MD 12/12/2020 8:12:24 AM Number of Addenda: 0 Note Initiated On: 12/12/2020 7:33 AM Estimated Blood Loss:  Estimated blood loss was minimal.      [facility]

## 2020-12-12 NOTE — Op Note (Signed)
[facility] Gastroenterology Patient Name: Sabrina Green Procedure Date: 12/12/2020 7:32 AM MRN: [id number] Account #: [id number] Date of Birth: 09-30-1945 Admit Type: Outpatient Age: 75 Room: [facility] ENDO ROOM 1 Gender: Female Note Status: Finalized Procedure:             Colonoscopy Indications:           Iron deficiency anemia Providers:             Andrey Farmer MD, MD Medicines:             Monitored Anesthesia Care Complications:         No immediate complications. Estimated blood loss:                         Minimal. Procedure:             Pre-Anesthesia Assessment:                        - Prior to the procedure, a History and Physical was                         performed, and patient medications and allergies were                         reviewed. The patient is competent. The risks and                         benefits of the procedure and the sedation options and                         risks were discussed with the patient. All questions                         were answered and informed consent was obtained.                         Patient identification and proposed procedure were                         verified by the physician, the nurse, the anesthetist                         and the technician in the endoscopy suite. Mental                         Status Examination: alert and oriented. Airway                         Examination: normal oropharyngeal airway and neck                         mobility. Respiratory Examination: clear to                         auscultation. CV Examination: normal. Prophylactic                         Antibiotics: The patient does not require prophylactic  antibiotics. Prior Anticoagulants: The patient has                         taken Eliquis (apixaban), last dose was 2 days prior                         to procedure. ASA Grade Assessment: III - A patient                         with severe  systemic disease. After reviewing the                         risks and benefits, the patient was deemed in                         satisfactory condition to undergo the procedure. The                         anesthesia plan was to use monitored anesthesia care                         (MAC). Immediately prior to administration of                         medications, the patient was re-assessed for adequacy                         to receive sedatives. The heart rate, respiratory                         rate, oxygen saturations, blood pressure, adequacy of                         pulmonary ventilation, and response to care were                         monitored throughout the procedure. The physical                         status of the patient was re-assessed after the                         procedure.                        After obtaining informed consent, the colonoscope was                         passed under direct vision. Throughout the procedure,                         the patient's blood pressure, pulse, and oxygen                         saturations were monitored continuously. The                         Colonoscope was introduced through the anus and  advanced to the the cecum, identified by appendiceal                         orifice and ileocecal valve. The colonoscopy was                         performed without difficulty. The patient tolerated                         the procedure well. The quality of the bowel                         preparation was excellent. Findings:      The perianal and digital rectal examinations were normal.      A 1 mm polyp was found in the hepatic flexure. The polyp was sessile.       The polyp was removed with a jumbo cold forceps. Resection and retrieval       were complete. Estimated blood loss was minimal.      Internal hemorrhoids were found during retroflexion. The hemorrhoids       were Grade I (internal  hemorrhoids that do not prolapse).      The exam was otherwise without abnormality on direct and retroflexion       views. Impression:            - One 1 mm polyp at the hepatic flexure, removed with                         a jumbo cold forceps. Resected and retrieved.                        - Internal hemorrhoids.                        - The examination was otherwise normal on direct and                         retroflexion views. Recommendation:        - Discharge patient to home.                        - Resume previous diet.                        - Resume Eliquis (apixaban) at prior dose tomorrow.                        - Await pathology results.                        - Repeat colonoscopy in 7 years for surveillance.                        - Return to referring physician as previously                         scheduled. Procedure Code(s):     --- Professional ---                        [phone], Colonoscopy, flexible;  with biopsy, single or                         multiple Diagnosis Code(s):     --- Professional ---                        K63.5, Polyp of colon                        K64.0, First degree hemorrhoids                        D50.9, Iron deficiency anemia, unspecified CPT copyright 2019 American Medical Association. All rights reserved. The codes documented in this report are preliminary and upon coder review may  be revised to meet current compliance requirements. Andrey Farmer MD, MD 12/12/2020 8:15:31 AM Number of Addenda: 0 Note Initiated On: 12/12/2020 7:32 AM Scope Withdrawal Time: 0 hours 7 minutes 24 seconds  Total Procedure Duration: 0 hours 13 minutes 8 seconds  Estimated Blood Loss:  Estimated blood loss was minimal.      [facility]

## 2020-12-12 NOTE — H&P (Signed)
Outpatient short stay form Pre-procedure 12/12/2020 7:40 AM Raylene Miyamoto MD, MPH  Primary Physician: Dr. Ellison Hughs  Reason for visit:  GERD/IDA  History of present illness:   75 y/o lady with history of IDA and colon polyps of unknown type. Also with history of rheumatoid arthritis and DVT/PE on eliquis with last dose being 2 days ago. Ideally, based on creatinine her eliquis would have been held 3 days so explained to patient that we can safely remove small polyps but larger polyps may need repeat procedure. No family history of GI malignancies. Had hysterectomy and cholecystectomy before.    Current Facility-Administered Medications:  .  0.9 %  sodium chloride infusion, , Intravenous, Continuous, Genevieve Ritzel, Hilton Cork, MD, Last Rate: 20 mL/hr at 12/12/20 7782, Continued from Pre-op at 12/12/20 4235  Medications Prior to Admission  Medication Sig Dispense Refill Last Dose  . allopurinol (ZYLOPRIM) 300 MG tablet Take 300 mg by mouth daily. In the morning   12/11/2020 at Unknown time  . atomoxetine (STRATTERA) 60 MG capsule Take 60 mg by mouth every morning.   12/11/2020 at Unknown time  . atorvastatin (LIPITOR) 40 MG tablet Take 1 tablet (40 mg total) by mouth daily. 90 tablet 3 12/11/2020 at Unknown time  . busPIRone (BUSPAR) 15 MG tablet Take 15 mg by mouth daily.   12/11/2020 at Unknown time  . chlorthalidone (HYGROTON) 25 MG tablet Take 12.5 mg by mouth daily.   12/11/2020 at Unknown time  . Cyanocobalamin (VITAMIN B-12) 5000 MCG TBDP Take 5,000 mcg by mouth daily.   12/11/2020 at Unknown time  . folic acid (FOLVITE) 361 MCG tablet Take 400 mcg by mouth daily.   12/11/2020 at Unknown time  . gabapentin (NEURONTIN) 300 MG capsule 1 capsule 3 TIMES DAILY (route: oral)   12/11/2020 at Unknown time  . labetalol (NORMODYNE) 100 MG tablet Take 1 tablet (100 mg total) by mouth 2 (two) times daily. 180 tablet 3 12/11/2020 at Unknown time  . losartan (COZAAR) 25 MG tablet Take 25 mg by mouth daily.    12/12/2020 at Unknown time  . methotrexate 250 MG/10ML injection Take 0.5  ml l SQ once a week x 12  Weeks   Past Week at Unknown time  . methotrexate 50 MG/2ML injection Inject 12.5 mg into the vein every Thursday. (0.5 ml)   Past Week at Unknown time  . omeprazole (PRILOSEC) 20 MG capsule Take 20 mg by mouth daily before breakfast.    12/11/2020 at Unknown time  . pantoprazole (PROTONIX) 40 MG tablet Take by mouth.   12/11/2020 at Unknown time  . abatacept (ORENCIA) 250 MG injection Inject into the vein. Once a month   11/15/2020  . ascorbic acid (VITAMIN C) 500 MG tablet Take 1 tablet by mouth daily. (Patient not taking: Reported on 12/12/2020)   Not Taking at Unknown time  . Cholecalciferol 25 MCG (1000 UT) tablet Take by mouth. (Patient not taking: Reported on 12/12/2020)   Not Taking at Unknown time  . ELIQUIS 5 MG TABS tablet TAKE ONE TABLET BY MOUTH TWICE A DAY 180 tablet 1 12/10/2020  . fluticasone (FLONASE) 50 MCG/ACT nasal spray Place 2 sprays into both nostrils daily.   12/09/2020  . HYDROcodone-acetaminophen (NORCO/VICODIN) 5-325 MG tablet Per instructions TWICE A DAY AS NEEDED  (route: oral) (Patient not taking: Reported on 12/12/2020)   Not Taking at Unknown time  . lubiprostone (AMITIZA) 24 MCG capsule Take 24 mcg by mouth 2 (two) times daily as needed for constipation.  (  Patient not taking: Reported on 12/12/2020)   Not Taking at Unknown time  . nortriptyline (PAMELOR) 25 MG capsule Take 25 mg by mouth at bedtime. (Patient not taking: Reported on 12/12/2020)   Not Taking at Unknown time  . oxyCODONE (OXY IR/ROXICODONE) 5 MG immediate release tablet 2 tablet EVERY 4 HOURS (route: oral) (Patient not taking: Reported on 12/12/2020)   Not Taking at Unknown time  . tiZANidine (ZANAFLEX) 4 MG capsule Take 4 mg by mouth 2 (two) times daily as needed for muscle spasms. (Patient not taking: Reported on 12/12/2020)   Not Taking at Unknown time     Allergies  Allergen Reactions  . Penicillins Hives      Past Medical History:  Diagnosis Date  . Anemia    vitamin b and d deficiency  . Anxiety   . Arthritis    osteoarthrits  . Chronic pain syndrome   . CKD (chronic kidney disease) stage 3, GFR 30-59 ml/min (HCC)   . Complication of anesthesia    difficulty waking up  . DDD (degenerative disc disease), cervical   . Fibromyalgia    not accurate, per patient  . GERD (gastroesophageal reflux disease)   . Gout   . Hearing loss   . History of kidney stones 1987  . Hypertension   . Pulmonary embolism ([place])   . Rheumatoid arthritis (HCC)    orencia infusions  . Vitamin B12 deficiency   . Vitamin D deficiency     Review of systems:  Otherwise negative.    Physical Exam  Gen: Alert, oriented. Appears stated age.  HEENT: PERRLA. Lungs: No respiratory distress CV: RRR Abd: soft, benign, no masses Ext: No edema    Planned procedures: Proceed with colonoscopy. The patient understands the nature of the planned procedure, indications, risks, alternatives and potential complications including but not limited to bleeding, infection, perforation, damage to internal organs and possible oversedation/side effects from anesthesia. The patient agrees and gives consent to proceed.  Please refer to procedure notes for findings, recommendations and patient disposition/instructions.     Raylene Miyamoto MD, MPH Gastroenterology 12/12/2020  7:40 AM

## 2020-12-12 NOTE — Interval H&P Note (Signed)
History and Physical Interval Note:  12/12/2020 7:44 AM  Sabrina Green  has presented today for surgery, with the diagnosis of IDA.  The various methods of treatment have been discussed with the patient and family. After consideration of risks, benefits and other options for treatment, the patient has consented to  Procedure(s): ESOPHAGOGASTRODUODENOSCOPY (EGD) (N/A) COLONOSCOPY WITH PROPOFOL (N/A) as a surgical intervention.  The patient's history has been reviewed, patient examined, no change in status, stable for surgery.  I have reviewed the patient's chart and labs.  Questions were answered to the patient's satisfaction.     Lesly Rubenstein  Ok to proceed with EGD/Colonoscopy

## 2020-12-13 ENCOUNTER — Encounter: Payer: Self-pay | Admitting: Gastroenterology

## 2020-12-13 LAB — SURGICAL PATHOLOGY

## 2021-01-15 ENCOUNTER — Other Ambulatory Visit: Payer: Medicare Other

## 2021-01-18 ENCOUNTER — Other Ambulatory Visit: Payer: Self-pay

## 2021-01-18 ENCOUNTER — Telehealth: Payer: Self-pay

## 2021-01-18 DIAGNOSIS — D649 Anemia, unspecified: Secondary | ICD-10-CM

## 2021-01-18 DIAGNOSIS — I2609 Other pulmonary embolism with acute cor pulmonale: Secondary | ICD-10-CM

## 2021-01-18 DIAGNOSIS — D696 Thrombocytopenia, unspecified: Secondary | ICD-10-CM

## 2021-01-18 DIAGNOSIS — I2782 Chronic pulmonary embolism: Secondary | ICD-10-CM

## 2021-01-18 NOTE — Telephone Encounter (Signed)
Orders put in for future labs.

## 2021-01-19 ENCOUNTER — Inpatient Hospital Stay (HOSPITAL_BASED_OUTPATIENT_CLINIC_OR_DEPARTMENT_OTHER): Payer: Medicare Other | Admitting: Internal Medicine

## 2021-01-19 ENCOUNTER — Encounter: Payer: Self-pay | Admitting: Internal Medicine

## 2021-01-19 ENCOUNTER — Other Ambulatory Visit: Payer: Self-pay

## 2021-01-19 ENCOUNTER — Inpatient Hospital Stay: Payer: Medicare Other | Attending: Internal Medicine

## 2021-01-19 VITALS — BP 94/69 | HR 87 | Temp 96.6°F | Resp 20 | Wt 252.0 lb

## 2021-01-19 DIAGNOSIS — Z86718 Personal history of other venous thrombosis and embolism: Secondary | ICD-10-CM | POA: Insufficient documentation

## 2021-01-19 DIAGNOSIS — N183 Chronic kidney disease, stage 3 unspecified: Secondary | ICD-10-CM | POA: Insufficient documentation

## 2021-01-19 DIAGNOSIS — I2699 Other pulmonary embolism without acute cor pulmonale: Secondary | ICD-10-CM | POA: Insufficient documentation

## 2021-01-19 DIAGNOSIS — M79605 Pain in left leg: Secondary | ICD-10-CM

## 2021-01-19 DIAGNOSIS — D693 Immune thrombocytopenic purpura: Secondary | ICD-10-CM | POA: Diagnosis not present

## 2021-01-19 DIAGNOSIS — I2609 Other pulmonary embolism with acute cor pulmonale: Secondary | ICD-10-CM

## 2021-01-19 DIAGNOSIS — M79604 Pain in right leg: Secondary | ICD-10-CM

## 2021-01-19 DIAGNOSIS — D631 Anemia in chronic kidney disease: Secondary | ICD-10-CM | POA: Insufficient documentation

## 2021-01-19 DIAGNOSIS — Z7901 Long term (current) use of anticoagulants: Secondary | ICD-10-CM | POA: Insufficient documentation

## 2021-01-19 DIAGNOSIS — R252 Cramp and spasm: Secondary | ICD-10-CM | POA: Diagnosis not present

## 2021-01-19 DIAGNOSIS — D649 Anemia, unspecified: Secondary | ICD-10-CM

## 2021-01-19 LAB — CBC WITH DIFFERENTIAL/PLATELET
Abs Immature Granulocytes: 0.02 10*3/uL (ref 0.00–0.07)
Basophils Absolute: 0 10*3/uL (ref 0.0–0.1)
Basophils Relative: 1 %
Eosinophils Absolute: 0.2 10*3/uL (ref 0.0–0.5)
Eosinophils Relative: 4 %
HCT: 37.7 % (ref 36.0–46.0)
Hemoglobin: 12.1 g/dL (ref 12.0–15.0)
Immature Granulocytes: 0 %
Lymphocytes Relative: 43 %
Lymphs Abs: 2.8 10*3/uL (ref 0.7–4.0)
MCH: 29.4 pg (ref 26.0–34.0)
MCHC: 32.1 g/dL (ref 30.0–36.0)
MCV: 91.5 fL (ref 80.0–100.0)
Monocytes Absolute: 0.3 10*3/uL (ref 0.1–1.0)
Monocytes Relative: 5 %
Neutro Abs: 3.1 10*3/uL (ref 1.7–7.7)
Neutrophils Relative %: 47 %
Platelets: 141 10*3/uL — ABNORMAL LOW (ref 150–400)
RBC: 4.12 MIL/uL (ref 3.87–5.11)
RDW: 14.8 % (ref 11.5–15.5)
WBC: 6.6 10*3/uL (ref 4.0–10.5)
nRBC: 0 % (ref 0.0–0.2)

## 2021-01-19 LAB — COMPREHENSIVE METABOLIC PANEL
ALT: 22 U/L (ref 0–44)
AST: 29 U/L (ref 15–41)
Albumin: 4.3 g/dL (ref 3.5–5.0)
Alkaline Phosphatase: 94 U/L (ref 38–126)
Anion gap: 6 (ref 5–15)
BUN: 35 mg/dL — ABNORMAL HIGH (ref 8–23)
CO2: 27 mmol/L (ref 22–32)
Calcium: 9.2 mg/dL (ref 8.9–10.3)
Chloride: 105 mmol/L (ref 98–111)
Creatinine, Ser: 1.78 mg/dL — ABNORMAL HIGH (ref 0.44–1.00)
GFR, Estimated: 30 mL/min — ABNORMAL LOW (ref >60)
Glucose, Bld: 97 mg/dL (ref 70–99)
Potassium: 4.2 mmol/L (ref 3.5–5.1)
Sodium: 138 mmol/L (ref 135–145)
Total Bilirubin: 0.6 mg/dL (ref 0.3–1.2)
Total Protein: 7.9 g/dL (ref 6.5–8.1)

## 2021-01-19 LAB — IRON AND TIBC
Iron: 45 ug/dL (ref 28–170)
Saturation Ratios: 12 % (ref 10.4–31.8)
TIBC: 384 ug/dL (ref 250–450)
UIBC: 339 ug/dL

## 2021-01-19 LAB — MAGNESIUM: Magnesium: 1.8 mg/dL (ref 1.7–2.4)

## 2021-01-19 LAB — IMMATURE PLATELET FRACTION: Immature Platelet Fraction: 5.7 % (ref 1.2–8.6)

## 2021-01-19 LAB — FERRITIN: Ferritin: 210 ng/mL (ref 11–307)

## 2021-01-19 NOTE — Assessment & Plan Note (Addendum)
#   Bilateral submassive Pulmonary embolism [AUG 2021]-likely unprovoked/right lower extremity DVT popliteal [on Eliquis]-lifelong anticoagulation.  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  Monitor for now  # Normocytic anemia- improved hemoglobin 12.2 /CKD [iron saturation 12%]-stable monitor for now  # Bil cramping/RA-less likely DVT clinically suspicious of rheumatoid arthritis; check vitamin D levels.  # CKD stage III GFR ~30 [f/u nephrology; Dr.Berkoben-[facility] ]  # DISPOSITION;will call  # [place] bil LE dopplers- ASAP # follow  4 months for MD assessment and labs (CBC with diff,CMP, ferritin, iron studies)- Dr.B

## 2021-01-19 NOTE — Progress Notes (Signed)
[facility] CONSULT NOTE  Patient Care Team: Sofie Hartigan, MD as PCP - General (Family Medicine) Minna Merritts, MD as PCP - Cardiology (Cardiology)  CHIEF COMPLAINTS/PURPOSE OF CONSULTATION: Bilateral PE  # Bilateral submassive Pulmonary embolism /right lower extremity popliteal DVT [AUG 2021]- Hypercoagulable work-up revealed a borderline protein S antigen and a low protein S activity; otherwise unprovoked; on Eliquis 5 mg twice daily-indefinite  #Mild thrombocytopenia platelets greater than 100  #CKD stage III           Oncology History   No history exists.   Hepatitis B core antibody, hepatitis C antibody, and HIV antibody were negative. ANA was negative. Factor V Leiden, prothrombin gene mutation, lupus anticoagulant panel, anti-cardiolipin antibodies were negative, and beta2-glycoprotein was negative.  Labs on 06/12/2020 revealed a normal protein S total antigen (93%), protein C total (81%), protein C activity (99%), and antithrombin III activity (78%).  Protein S free antigen was borderline (64%; 61-136%), and protein S activity was low (54%; 63-140%).  Labs on 09/18/2020 revealed a protein S activity of 62% (slightly low) and a protein S antigen of 90% and free 83%.  HISTORY OF PRESENTING ILLNESS:  Sabrina Green 75 y.o.  female history of submassive PE bilateral submassive PE and right popliteal DVT (02/2020), anemia/CKD and thrombocytopenia w is here for follow-up.  Patient denies any blood in stools or black or stools.  Denies any unusual shortness of breath or cough.  Patient is concerned about upcoming long travel to discuss today discussed.  She is concerned about history of DVT of the lower extremity.  Complains of chronic joint pains.  Review of Systems  Constitutional:  Negative for chills, diaphoresis, fever, malaise/fatigue and weight loss.  HENT:  Negative for nosebleeds and sore throat.   Eyes:  Negative for double vision.  Respiratory:   Negative for cough, hemoptysis, sputum production, shortness of breath and wheezing.   Cardiovascular:  Negative for chest pain, palpitations, orthopnea and leg swelling.  Gastrointestinal:  Negative for abdominal pain, blood in stool, constipation, diarrhea, heartburn, melena, nausea and vomiting.  Genitourinary:  Negative for dysuria, frequency and urgency.  Musculoskeletal:  Positive for back pain and joint pain.  Skin: Negative.  Negative for itching and rash.  Neurological:  Negative for dizziness, tingling, focal weakness, weakness and headaches.  Endo/Heme/Allergies:  Does not bruise/bleed easily.  Psychiatric/Behavioral:  Negative for depression. The patient is not nervous/anxious and does not have insomnia.     MEDICAL HISTORY:  Past Medical History:  Diagnosis Date   Anemia    vitamin b and d deficiency   Anxiety    Arthritis    osteoarthrits   Chronic pain syndrome    CKD (chronic kidney disease) stage 3, GFR 30-59 ml/min (HCC)    Complication of anesthesia    difficulty waking up   DDD (degenerative disc disease), cervical    Fibromyalgia    not accurate, per patient   GERD (gastroesophageal reflux disease)    Gout    Hearing loss    History of kidney stones 1987   Hypertension    Pulmonary embolism (HCC)    Rheumatoid arthritis ([place])    orencia infusions   Vitamin B12 deficiency    Vitamin D deficiency     SURGICAL HISTORY: Past Surgical History:  Procedure Laterality Date   ABDOMINAL HYSTERECTOMY     ABDOMINAL HYSTERECTOMY     ANKLE FRACTURE SURGERY Right [facility], [place], [place].   METAL  IN ANKLE   BREAST BIOPSY Right 02/09/2019   affirm bx of calcs, ribbon marker neg   CESAREAN SECTION     CHOLECYSTECTOMY     COLONOSCOPY WITH PROPOFOL N/A 12/12/2020   Procedure: COLONOSCOPY WITH PROPOFOL;  Surgeon: Lesly Rubenstein, MD;  Location: [facility] ENDOSCOPY;  Service: Endoscopy;  Laterality: N/A;   ESOPHAGOGASTRODUODENOSCOPY N/A 12/12/2020    Procedure: ESOPHAGOGASTRODUODENOSCOPY (EGD);  Surgeon: Lesly Rubenstein, MD;  Location: [facility] ENDOSCOPY;  Service: Endoscopy;  Laterality: N/A;   EYE SURGERY Bilateral 2015   cataract surgery, [place], [place]   KNEE ARTHROSCOPY     PULMONARY THROMBECTOMY N/A 03/10/2020   Procedure: PULMONARY THROMBECTOMY;  Surgeon: Algernon Huxley, MD;  Location: [facility];  Service: Cardiovascular;  Laterality: N/A;   TONSILLECTOMY     TOTAL KNEE ARTHROPLASTY Left 09/21/2019   Procedure: TOTAL KNEE ARTHROPLASTY;  Surgeon: Corky Mull, MD;  Location: [facility] ORS;  Service: Orthopedics;  Laterality: Left;   TUBAL LIGATION      SOCIAL HISTORY: Social History   Socioeconomic History   Marital status: Widowed    Spouse name: Not on file   Number of children: Not on file   Years of education: Not on file   Highest education level: Not on file  Occupational History   Occupation: case Freight forwarder for [employer]    Comment: RETIRED  Tobacco Use   Smoking status: Never   Smokeless tobacco: Never  Vaping Use   Vaping Use: Never used  Substance and Sexual Activity   Alcohol use: No   Drug use: No   Sexual activity: Not on file  Other Topics Concern   Not on file  Social History Narrative   Patient lives alone. Not sure if anyone will be able to stay with her after surgery.     She may look into going to a rehab facility if needed.   Social Determinants of Health   Financial Resource Strain: Not on file  Food Insecurity: Not on file  Transportation Needs: Not on file  Physical Activity: Not on file  Stress: Not on file  Social Connections: Not on file  Intimate Partner Violence: Not on file    FAMILY HISTORY: Family History  Problem Relation Age of Onset   Breast cancer Other 76   Hypertension Mother    Migraines Mother    Other Father    Lung cancer Sister     ALLERGIES:  is allergic to penicillins.  MEDICATIONS:  Current Outpatient Medications  Medication Sig Dispense Refill    abatacept (ORENCIA) 250 MG injection Inject into the vein. Once a month     allopurinol (ZYLOPRIM) 300 MG tablet Take 300 mg by mouth daily. In the morning     atomoxetine (STRATTERA) 60 MG capsule Take 60 mg by mouth every morning.     atorvastatin (LIPITOR) 40 MG tablet Take 1 tablet (40 mg total) by mouth daily. 90 tablet 3   busPIRone (BUSPAR) 15 MG tablet Take 15 mg by mouth daily.     chlorthalidone (HYGROTON) 25 MG tablet Take 12.5 mg by mouth daily.     Cyanocobalamin (VITAMIN B-12) 5000 MCG TBDP Take 5,000 mcg by mouth daily.     diazepam (VALIUM) 10 MG tablet Take by mouth.     ELIQUIS 5 MG TABS tablet TAKE ONE TABLET BY MOUTH TWICE A DAY 180 tablet 1   fluticasone (FLONASE) 50 MCG/ACT nasal spray Place 2 sprays into both nostrils daily.  folic acid (FOLVITE) 024 MCG tablet Take 400 mcg by mouth daily.     gabapentin (NEURONTIN) 300 MG capsule 1 capsule 3 TIMES DAILY (route: oral)     gabapentin (NEURONTIN) 300 MG capsule Take by mouth.     labetalol (NORMODYNE) 100 MG tablet Take 1 tablet (100 mg total) by mouth 2 (two) times daily. 180 tablet 3   losartan (COZAAR) 25 MG tablet Take 25 mg by mouth daily.     methotrexate 250 MG/10ML injection Take 0.5  ml l SQ once a week x 12  Weeks     methotrexate 50 MG/2ML injection Inject 12.5 mg into the vein every Thursday. (0.5 ml)     omeprazole (PRILOSEC) 20 MG capsule Take 20 mg by mouth daily before breakfast.      pantoprazole (PROTONIX) 40 MG tablet Take by mouth.     TUBERCULIN SYR 1CC/27GX1/2" 27G X 1/2" 1 ML MISC Use once a week for methotrexate injections.     ascorbic acid (VITAMIN C) 500 MG tablet Take 1 tablet by mouth daily. (Patient not taking: No sig reported)     Cholecalciferol 25 MCG (1000 UT) tablet Take by mouth. (Patient not taking: No sig reported)     HYDROcodone-acetaminophen (NORCO/VICODIN) 5-325 MG tablet Per instructions TWICE A DAY AS NEEDED  (route: oral) (Patient not taking: No sig reported)     lubiprostone  (AMITIZA) 24 MCG capsule Take 24 mcg by mouth 2 (two) times daily as needed for constipation.  (Patient not taking: No sig reported)     nortriptyline (PAMELOR) 25 MG capsule Take 25 mg by mouth at bedtime. (Patient not taking: No sig reported)     oxyCODONE (OXY IR/ROXICODONE) 5 MG immediate release tablet 2 tablet EVERY 4 HOURS (route: oral) (Patient not taking: No sig reported)     tiZANidine (ZANAFLEX) 4 MG capsule Take 4 mg by mouth 2 (two) times daily as needed for muscle spasms. (Patient not taking: No sig reported)     No current facility-administered medications for this visit.      Marland Kitchen  PHYSICAL EXAMINATION:  Vitals:   01/19/21 0953  BP: 94/69  Pulse: 87  Resp: 20  Temp: (!) 96.6 F (35.9 C)  SpO2: 100%   Filed Weights   01/19/21 0953  Weight: 251 lb 15.8 oz (114.3 kg)    Physical Exam Vitals and nursing note reviewed.  Constitutional:      Comments: Ambulating: Independently  Accompanied: Alone  HENT:     Head: Normocephalic and atraumatic.     Mouth/Throat:     Pharynx: Oropharynx is clear.  Eyes:     Extraocular Movements: Extraocular movements intact.     Pupils: Pupils are equal, round, and reactive to light.  Cardiovascular:     Rate and Rhythm: Normal rate and regular rhythm.  Pulmonary:     Comments: Decreased breath sounds bilaterally.  Abdominal:     Palpations: Abdomen is soft.  Musculoskeletal:        General: Normal range of motion.     Cervical back: Normal range of motion.  Skin:    General: Skin is warm.  Neurological:     General: No focal deficit present.     Mental Status: She is alert and oriented to person, place, and time.  Psychiatric:        Behavior: Behavior normal.        Judgment: Judgment normal.     LABORATORY DATA:  I have reviewed the data as  listed Lab Results  Component Value Date   WBC 6.6 01/19/2021   HGB 12.1 01/19/2021   HCT 37.7 01/19/2021   MCV 91.5 01/19/2021   PLT 141 (L) 01/19/2021   Recent Labs     03/12/20 0039 03/13/20 0746 03/28/20 1539 06/04/20 1735 09/18/20 0813 01/19/21 0942  NA 143 139 144 141 139 138  K 3.8 3.9 3.9 5.0 4.1 4.2  CL 109 104 107 107 102 105  CO2 21* 21* 26 23 26 27   GLUCOSE 140* 120* 87 147* 92 97  BUN 41* 35* 23 22 27* 35*  CREATININE 2.21* 1.94* 1.33* 1.20* 1.38* 1.78*  CALCIUM 8.8* 9.1 9.1 8.8* 9.1 9.2  GFRNONAA 22* 25* 40* 48* 41* 30*  GFRAA 25* 29* 46*  --   --   --   PROT  --   --  7.3  --  7.0 7.9  ALBUMIN  --   --  3.6  --  3.8 4.3  AST  --   --  20  --  19 29  ALT  --   --  13  --  12 22  ALKPHOS  --   --  74  --  82 94  BILITOT  --   --  0.5  --  0.7 0.6    RADIOGRAPHIC STUDIES: I have personally reviewed the radiological images as listed and agreed with the findings in the report. No results found.  ASSESSMENT & PLAN:   Bilateral pulmonary embolism (HCC) # Bilateral submassive Pulmonary embolism [AUG 2021]-likely unprovoked/right lower extremity DVT popliteal [on Eliquis]-lifelong anticoagulation.  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  Monitor for now  # Normocytic anemia- improved hemoglobin 12.2 /CKD [iron saturation 12%]-stable monitor for now  # Bil cramping/RA-less likely DVT clinically suspicious of rheumatoid arthritis; check vitamin D levels.  # CKD stage III GFR ~30 [f/u nephrology; Dr.Berkoben-[facility] ]  # DISPOSITION;will call  # [place] bil LE dopplers- ASAP # follow  4 months for MD assessment and labs (CBC with diff,CMP, ferritin, iron studies)- Dr.B    All questions were answered. The patient knows to call the clinic with any problems, questions or concerns.       Cammie Sickle, MD 01/21/2021 4:33 PM

## 2021-01-19 NOTE — Progress Notes (Signed)
pts BP is 94/69 pt states she feels fine but has not taken her medication this morning because she did not have time. Also, pt believes her BUSPAR is making her dizzy, is suppose to take it at nighttime but doesn't do well so she tried changing it to the morning.

## 2021-01-22 ENCOUNTER — Ambulatory Visit
Admission: RE | Admit: 2021-01-22 | Discharge: 2021-01-22 | Disposition: A | Discharge status: Home / Self Care | Payer: Medicare Other | Source: Ambulatory Visit | Attending: Internal Medicine | Admitting: Internal Medicine

## 2021-01-22 ENCOUNTER — Ambulatory Visit: Payer: Medicare Other | Admitting: Hematology and Oncology

## 2021-01-22 ENCOUNTER — Other Ambulatory Visit: Payer: Self-pay

## 2021-01-22 DIAGNOSIS — M79605 Pain in left leg: Secondary | ICD-10-CM | POA: Insufficient documentation

## 2021-01-22 DIAGNOSIS — M79604 Pain in right leg: Secondary | ICD-10-CM | POA: Diagnosis present

## 2021-01-22 DIAGNOSIS — Z86718 Personal history of other venous thrombosis and embolism: Secondary | ICD-10-CM | POA: Insufficient documentation

## 2021-01-23 ENCOUNTER — Telehealth: Payer: Self-pay | Admitting: Internal Medicine

## 2021-01-23 NOTE — Telephone Encounter (Signed)
On 6/28-I spoke to patient regarding results of the lower extremity/-chronic DVT right extremity.  Continue anticoagulation as planned.   Follow-up as planned

## 2021-02-19 ENCOUNTER — Other Ambulatory Visit: Payer: Medicare Other

## 2021-04-03 ENCOUNTER — Other Ambulatory Visit: Payer: Self-pay | Admitting: Family Medicine

## 2021-04-03 DIAGNOSIS — Z1231 Encounter for screening mammogram for malignant neoplasm of breast: Secondary | ICD-10-CM

## 2021-04-23 ENCOUNTER — Ambulatory Visit
Admission: RE | Admit: 2021-04-23 | Discharge: 2021-04-23 | Disposition: A | Discharge status: Home / Self Care | Payer: Medicare Other | Source: Ambulatory Visit | Attending: Family Medicine | Admitting: Family Medicine

## 2021-04-23 ENCOUNTER — Other Ambulatory Visit: Payer: Self-pay

## 2021-04-23 DIAGNOSIS — Z1231 Encounter for screening mammogram for malignant neoplasm of breast: Secondary | ICD-10-CM | POA: Diagnosis present

## 2021-05-08 ENCOUNTER — Encounter: Payer: Self-pay | Admitting: Urology

## 2021-05-08 ENCOUNTER — Ambulatory Visit (INDEPENDENT_AMBULATORY_CARE_PROVIDER_SITE_OTHER): Payer: Medicare Other | Admitting: Urology

## 2021-05-08 ENCOUNTER — Other Ambulatory Visit: Payer: Self-pay | Admitting: *Deleted

## 2021-05-08 ENCOUNTER — Other Ambulatory Visit: Payer: Self-pay

## 2021-05-08 ENCOUNTER — Other Ambulatory Visit
Admission: RE | Admit: 2021-05-08 | Discharge: 2021-05-08 | Disposition: A | Discharge status: Home / Self Care | Payer: Medicare Other | Attending: Urology | Admitting: Urology

## 2021-05-08 VITALS — BP 125/67 | HR 86 | Ht 65.0 in | Wt 251.0 lb

## 2021-05-08 DIAGNOSIS — N3281 Overactive bladder: Secondary | ICD-10-CM

## 2021-05-08 DIAGNOSIS — R32 Unspecified urinary incontinence: Secondary | ICD-10-CM | POA: Diagnosis not present

## 2021-05-08 DIAGNOSIS — N3941 Urge incontinence: Secondary | ICD-10-CM | POA: Insufficient documentation

## 2021-05-08 LAB — URINALYSIS, COMPLETE (UACMP) WITH MICROSCOPIC
Bilirubin Urine: NEGATIVE
Glucose, UA: NEGATIVE mg/dL
Ketones, ur: NEGATIVE mg/dL
Leukocytes,Ua: NEGATIVE
Nitrite: NEGATIVE
Protein, ur: NEGATIVE mg/dL
Specific Gravity, Urine: 1.025 (ref 1.005–1.030)
pH: 5.5 (ref 5.0–8.0)

## 2021-05-08 LAB — BLADDER SCAN AMB NON-IMAGING

## 2021-05-08 MED ORDER — OXYBUTYNIN CHLORIDE ER 10 MG PO TB24: 10.0000 mg | ORAL_TABLET | Freq: Every day | ORAL | 1 refills | Status: DC

## 2021-05-08 NOTE — Patient Instructions (Signed)

## 2021-05-08 NOTE — Addendum Note (Signed)
Addended by: Donalee Citrin on: 05/08/2021 11:25 AM   Modules accepted: Orders

## 2021-05-08 NOTE — Progress Notes (Signed)
05/08/21 11:15 AM   Sabrina Green [birth year] [id number]  CC: Overactive bladder, urge incontinence  HPI: 75 year old female with morbid obesity and BMI of 42 who reports a few years of worsening incontinence.  She previously had some problems with stress incontinence, but this resolved after starting Kegel exercises.  She has incontinence when she has the urge and cannot make it to the restroom in time.  She also has urgency and frequency during the day, nocturia 1-2 times overnight.  She denies any gross hematuria or recent UTIs.  She drinks primarily water during the day and has cut back on sodas.  She has never tried any medications for this.  Urinalysis today benign, PVR normal at 0 mL.  CT abdomen pelvis in September 2021 was benign from a urology perspective.   PMH: Past Medical History:  Diagnosis Date   Anemia    vitamin b and d deficiency   Anxiety    Arthritis    osteoarthrits   Chronic pain syndrome    CKD (chronic kidney disease) stage 3, GFR 30-59 ml/min (HCC)    Complication of anesthesia    difficulty waking up   DDD (degenerative disc disease), cervical    Fibromyalgia    not accurate, per patient   GERD (gastroesophageal reflux disease)    Gout    Hearing loss    History of kidney stones 1987   Hypertension    Pulmonary embolism (HCC)    Rheumatoid arthritis ([place])    orencia infusions   Vitamin B12 deficiency    Vitamin D deficiency     Surgical History: Past Surgical History:  Procedure Laterality Date   ABDOMINAL HYSTERECTOMY     ABDOMINAL HYSTERECTOMY     ANKLE FRACTURE SURGERY Right [facility], [place], [place].   METAL IN ANKLE   BREAST BIOPSY Right 02/09/2019   affirm bx of calcs, ribbon marker neg   CESAREAN SECTION     CHOLECYSTECTOMY     COLONOSCOPY WITH PROPOFOL N/A 12/12/2020   Procedure: COLONOSCOPY WITH PROPOFOL;  Surgeon: Lesly Rubenstein, MD;  Location: [facility] ENDOSCOPY;  Service: Endoscopy;  Laterality: N/A;    ESOPHAGOGASTRODUODENOSCOPY N/A 12/12/2020   Procedure: ESOPHAGOGASTRODUODENOSCOPY (EGD);  Surgeon: Lesly Rubenstein, MD;  Location: [facility] ENDOSCOPY;  Service: Endoscopy;  Laterality: N/A;   EYE SURGERY Bilateral 2015   cataract surgery, [place], [place]   KNEE ARTHROSCOPY     PULMONARY THROMBECTOMY N/A 03/10/2020   Procedure: PULMONARY THROMBECTOMY;  Surgeon: Algernon Huxley, MD;  Location: [facility];  Service: Cardiovascular;  Laterality: N/A;   TONSILLECTOMY     TOTAL KNEE ARTHROPLASTY Left 09/21/2019   Procedure: TOTAL KNEE ARTHROPLASTY;  Surgeon: Corky Mull, MD;  Location: [facility] ORS;  Service: Orthopedics;  Laterality: Left;   TUBAL LIGATION       Family History: Family History  Problem Relation Age of Onset   Breast cancer Other 40   Hypertension Mother    Migraines Mother    Other Father    Lung cancer Sister     Social History:  reports that she has never smoked. She has never used smokeless tobacco. She reports that she does not drink alcohol and does not use drugs.  Physical Exam: BP 125/67   Pulse 86   Ht 5\' 5"  (1.651 m)   Wt 251 lb (113.9 kg)   BMI 41.77 kg/m    Constitutional:  Alert and oriented, No acute distress. Cardiovascular: No clubbing, cyanosis, or edema.  Respiratory: Normal respiratory effort, no increased work of breathing. GI: Abdomen is soft, nontender, nondistended, no abdominal masses  Laboratory Data: Reviewed, see HPI  Pertinent Imaging: I have personally viewed and interpreted the CT from September 2021 showing normal kidneys and bladder with no stones or hydronephrosis.  Assessment & Plan:   75 year old female with a few years of overactive bladder symptoms and urge incontinence.  Urinalysis benign today, PVR normal at 0 mL, and recent CT benign.  We discussed that overactive bladder (OAB) is not a disease, but is a symptom complex that is generally not life-threatening.  Symptoms typically include urinary urgency, frequency,  and urge incontinence.  There are numerous treatment options, however there are risks and benefits with both medical and surgical management.  First-line treatment is behavioral therapies including bladder training, pelvic floor muscle training, and fluid management.  Second line treatments include oral antimuscarinics(Ditropan er, Trospium) and beta-3 agonist (Mybetriq). There is typically a period of medication trial (4-8 weeks) to find the optimal therapy and dosing. If symptoms are bothersome despite the above management, third line options include intra-detrusor botox, peripheral tibial nerve stimulation (PTNS), and interstim (SNS). These are more invasive treatments with higher side effect profile, but may improve quality of life for patients with severe OAB symptoms.   Trial of oxybutynin 10 mg XL for OAB symptoms RTC with PA 6 weeks symptom check  Nickolas Madrid, MD 05/08/2021  [facility] [address] [place], [place] [address] [phone]

## 2021-05-22 ENCOUNTER — Inpatient Hospital Stay: Payer: Medicare Other | Attending: Internal Medicine

## 2021-05-22 ENCOUNTER — Other Ambulatory Visit: Payer: Self-pay

## 2021-05-22 ENCOUNTER — Inpatient Hospital Stay (HOSPITAL_BASED_OUTPATIENT_CLINIC_OR_DEPARTMENT_OTHER): Payer: Medicare Other | Admitting: Internal Medicine

## 2021-05-22 VITALS — BP 128/55 | HR 76 | Temp 97.6°F | Resp 20 | Wt 253.8 lb

## 2021-05-22 DIAGNOSIS — D649 Anemia, unspecified: Secondary | ICD-10-CM | POA: Diagnosis not present

## 2021-05-22 DIAGNOSIS — Z86718 Personal history of other venous thrombosis and embolism: Secondary | ICD-10-CM | POA: Diagnosis present

## 2021-05-22 DIAGNOSIS — Z7901 Long term (current) use of anticoagulants: Secondary | ICD-10-CM | POA: Insufficient documentation

## 2021-05-22 DIAGNOSIS — D693 Immune thrombocytopenic purpura: Secondary | ICD-10-CM | POA: Diagnosis not present

## 2021-05-22 DIAGNOSIS — I2699 Other pulmonary embolism without acute cor pulmonale: Secondary | ICD-10-CM

## 2021-05-22 DIAGNOSIS — M069 Rheumatoid arthritis, unspecified: Secondary | ICD-10-CM | POA: Insufficient documentation

## 2021-05-22 DIAGNOSIS — N183 Chronic kidney disease, stage 3 unspecified: Secondary | ICD-10-CM | POA: Diagnosis not present

## 2021-05-22 DIAGNOSIS — Z79899 Other long term (current) drug therapy: Secondary | ICD-10-CM | POA: Insufficient documentation

## 2021-05-22 DIAGNOSIS — Z86711 Personal history of pulmonary embolism: Secondary | ICD-10-CM | POA: Insufficient documentation

## 2021-05-22 LAB — CBC WITH DIFFERENTIAL/PLATELET
Abs Immature Granulocytes: 0.02 10*3/uL (ref 0.00–0.07)
Basophils Absolute: 0 10*3/uL (ref 0.0–0.1)
Basophils Relative: 1 %
Eosinophils Absolute: 0.2 10*3/uL (ref 0.0–0.5)
Eosinophils Relative: 4 %
HCT: 36.2 % (ref 36.0–46.0)
Hemoglobin: 11.3 g/dL — ABNORMAL LOW (ref 12.0–15.0)
Immature Granulocytes: 0 %
Lymphocytes Relative: 31 %
Lymphs Abs: 1.9 10*3/uL (ref 0.7–4.0)
MCH: 28.8 pg (ref 26.0–34.0)
MCHC: 31.2 g/dL (ref 30.0–36.0)
MCV: 92.1 fL (ref 80.0–100.0)
Monocytes Absolute: 0.4 10*3/uL (ref 0.1–1.0)
Monocytes Relative: 6 %
Neutro Abs: 3.6 10*3/uL (ref 1.7–7.7)
Neutrophils Relative %: 58 %
Platelets: 148 10*3/uL — ABNORMAL LOW (ref 150–400)
RBC: 3.93 MIL/uL (ref 3.87–5.11)
RDW: 15.5 % (ref 11.5–15.5)
WBC: 6.1 10*3/uL (ref 4.0–10.5)
nRBC: 0 % (ref 0.0–0.2)

## 2021-05-22 LAB — COMPREHENSIVE METABOLIC PANEL
ALT: 16 U/L (ref 0–44)
AST: 21 U/L (ref 15–41)
Albumin: 3.8 g/dL (ref 3.5–5.0)
Alkaline Phosphatase: 94 U/L (ref 38–126)
Anion gap: 7 (ref 5–15)
BUN: 30 mg/dL — ABNORMAL HIGH (ref 8–23)
CO2: 27 mmol/L (ref 22–32)
Calcium: 8.8 mg/dL — ABNORMAL LOW (ref 8.9–10.3)
Chloride: 104 mmol/L (ref 98–111)
Creatinine, Ser: 1.37 mg/dL — ABNORMAL HIGH (ref 0.44–1.00)
GFR, Estimated: 41 mL/min — ABNORMAL LOW (ref >60)
Glucose, Bld: 126 mg/dL — ABNORMAL HIGH (ref 70–99)
Potassium: 3.8 mmol/L (ref 3.5–5.1)
Sodium: 138 mmol/L (ref 135–145)
Total Bilirubin: 0.4 mg/dL (ref 0.3–1.2)
Total Protein: 7.1 g/dL (ref 6.5–8.1)

## 2021-05-22 LAB — IRON AND TIBC
Iron: 79 ug/dL (ref 28–170)
Saturation Ratios: 22 % (ref 10.4–31.8)
TIBC: 357 ug/dL (ref 250–450)
UIBC: 278 ug/dL

## 2021-05-22 LAB — FERRITIN: Ferritin: 153 ng/mL (ref 11–307)

## 2021-05-22 NOTE — Assessment & Plan Note (Addendum)
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 No evidence of right lower extremity acute DVT.  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  Monitor for now  # Normocytic anemia- improved hemoglobin 11-12 /CKD [iron saturation 22%]- STABLE; recommend PO iron every other day.   # CKD stage III GFR ~30 [f/u nephrology; Dr.Berkoben-[facility] ]  # lower abdominal pain/ pelvic pain-CT scan September 2021-negative for any malignancies [patient is concerned] recent UA-negative; unclear etiology.  No constipation.    Defer to PCP for further work-up/management.  # RA- Dr.Patel.   # DISPOSITION: # follow 6 months for MD assessment and labs [facility] with diff,CMP, ferritin, iron studies]- Dr.B

## 2021-05-22 NOTE — Progress Notes (Signed)
[facility] CONSULT NOTE  Patient Care Team: Sofie Hartigan, MD as PCP - General (Family Medicine) Minna Merritts, MD as PCP - Cardiology (Cardiology)  CHIEF COMPLAINTS/PURPOSE OF CONSULTATION: Bilateral PE  # Bilateral submassive Pulmonary embolism /right lower extremity popliteal DVT [AUG 2021]- Hypercoagulable work-up revealed a borderline protein S antigen and a low protein S activity; otherwise unprovoked; on Eliquis 5 mg twice daily-indefinite  #Mild thrombocytopenia platelets greater than 100  #CKD stage III           Oncology History   No history exists.   Hepatitis B core antibody, hepatitis C antibody, and HIV antibody were negative. ANA was negative. Factor V Leiden, prothrombin gene mutation, lupus anticoagulant panel, anti-cardiolipin antibodies were negative, and beta2-glycoprotein was negative.  Labs on 06/12/2020 revealed a normal protein S total antigen (93%), protein C total (81%), protein C activity (99%), and antithrombin III activity (78%).  Protein S free antigen was borderline (64%; 61-136%), and protein S activity was low (54%; 63-140%).  Labs on 09/18/2020 revealed a protein S activity of 62% (slightly low) and a protein S antigen of 90% and free 83%.  HISTORY OF PRESENTING ILLNESS:  Sabrina Green 75 y.o.  female history of submassive PE bilateral submassive PE and right popliteal DVT (02/2020), anemia/CKD and thrombocytopenia w is here for follow-up.  Patient denies any blood in stools or black or stools.  Noticed to have pain in the abdomen lower-this morning.  Lasted for 20 minutes.  Currently none.  No nausea no vomiting.  Complains of chronic joint pains.  Complains of significant fatigue.  Review of Systems  Constitutional:  Positive for malaise/fatigue. Negative for chills, diaphoresis, fever and weight loss.  HENT:  Negative for nosebleeds and sore throat.   Eyes:  Negative for double vision.  Respiratory:  Negative for cough,  hemoptysis, sputum production, shortness of breath and wheezing.   Cardiovascular:  Negative for chest pain, palpitations, orthopnea and leg swelling.  Gastrointestinal:  Positive for abdominal pain. Negative for blood in stool, constipation, diarrhea, heartburn, melena, nausea and vomiting.  Genitourinary:  Negative for dysuria, frequency and urgency.  Musculoskeletal:  Positive for back pain and joint pain.  Skin: Negative.  Negative for itching and rash.  Neurological:  Negative for dizziness, tingling, focal weakness, weakness and headaches.  Endo/Heme/Allergies:  Does not bruise/bleed easily.  Psychiatric/Behavioral:  Negative for depression. The patient is not nervous/anxious and does not have insomnia.     MEDICAL HISTORY:  Past Medical History:  Diagnosis Date   Anemia    vitamin b and d deficiency   Anxiety    Arthritis    osteoarthrits   Chronic pain syndrome    CKD (chronic kidney disease) stage 3, GFR 30-59 ml/min (HCC)    Complication of anesthesia    difficulty waking up   DDD (degenerative disc disease), cervical    Fibromyalgia    not accurate, per patient   GERD (gastroesophageal reflux disease)    Gout    Hearing loss    History of kidney stones 1987   Hypertension    Pulmonary embolism (HCC)    Rheumatoid arthritis ([place])    orencia infusions   Vitamin B12 deficiency    Vitamin D deficiency     SURGICAL HISTORY: Past Surgical History:  Procedure Laterality Date   ABDOMINAL HYSTERECTOMY     ABDOMINAL HYSTERECTOMY     ANKLE FRACTURE SURGERY Right [facility], [place], [place].   METAL IN  ANKLE   BREAST BIOPSY Right 02/09/2019   affirm bx of calcs, ribbon marker neg   CESAREAN SECTION     CHOLECYSTECTOMY     COLONOSCOPY WITH PROPOFOL N/A 12/12/2020   Procedure: COLONOSCOPY WITH PROPOFOL;  Surgeon: Lesly Rubenstein, MD;  Location: [facility] ENDOSCOPY;  Service: Endoscopy;  Laterality: N/A;   ESOPHAGOGASTRODUODENOSCOPY N/A 12/12/2020    Procedure: ESOPHAGOGASTRODUODENOSCOPY (EGD);  Surgeon: Lesly Rubenstein, MD;  Location: [facility] ENDOSCOPY;  Service: Endoscopy;  Laterality: N/A;   EYE SURGERY Bilateral 2015   cataract surgery, [place], [place]   KNEE ARTHROSCOPY     PULMONARY THROMBECTOMY N/A 03/10/2020   Procedure: PULMONARY THROMBECTOMY;  Surgeon: Algernon Huxley, MD;  Location: [facility];  Service: Cardiovascular;  Laterality: N/A;   TONSILLECTOMY     TOTAL KNEE ARTHROPLASTY Left 09/21/2019   Procedure: TOTAL KNEE ARTHROPLASTY;  Surgeon: Corky Mull, MD;  Location: [facility] ORS;  Service: Orthopedics;  Laterality: Left;   TUBAL LIGATION      SOCIAL HISTORY: Social History   Socioeconomic History   Marital status: Widowed    Spouse name: Not on file   Number of children: Not on file   Years of education: Not on file   Highest education level: Not on file  Occupational History   Occupation: case Freight forwarder for [employer]    Comment: RETIRED  Tobacco Use   Smoking status: Never   Smokeless tobacco: Never  Vaping Use   Vaping Use: Never used  Substance and Sexual Activity   Alcohol use: No   Drug use: No   Sexual activity: Not on file  Other Topics Concern   Not on file  Social History Narrative   Patient lives alone. Not sure if anyone will be able to stay with her after surgery.     She may look into going to a rehab facility if needed.   Social Determinants of Health   Financial Resource Strain: Not on file  Food Insecurity: Not on file  Transportation Needs: Not on file  Physical Activity: Not on file  Stress: Not on file  Social Connections: Not on file  Intimate Partner Violence: Not on file    FAMILY HISTORY: Family History  Problem Relation Age of Onset   Breast cancer Other 56   Hypertension Mother    Migraines Mother    Other Father    Lung cancer Sister     ALLERGIES:  is allergic to penicillins.  MEDICATIONS:  Current Outpatient Medications  Medication Sig Dispense Refill    abatacept (ORENCIA) 250 MG injection Inject into the vein. Once a month     allopurinol (ZYLOPRIM) 300 MG tablet Take 300 mg by mouth daily. In the morning     atomoxetine (STRATTERA) 60 MG capsule Take 60 mg by mouth every morning.     atorvastatin (LIPITOR) 40 MG tablet Take 1 tablet (40 mg total) by mouth daily. 90 tablet 3   busPIRone (BUSPAR) 15 MG tablet Take 15 mg by mouth daily.     chlorthalidone (HYGROTON) 25 MG tablet Take 12.5 mg by mouth daily.     Cyanocobalamin (VITAMIN B-12) 5000 MCG TBDP Take 5,000 mcg by mouth daily.     diazepam (VALIUM) 10 MG tablet Take by mouth.     ELIQUIS 5 MG TABS tablet TAKE ONE TABLET BY MOUTH TWICE A DAY 180 tablet 1   fluticasone (FLONASE) 50 MCG/ACT nasal spray Place 2 sprays into both nostrils daily.     folic  acid (FOLVITE) 400 MCG tablet Take 400 mcg by mouth daily.     gabapentin (NEURONTIN) 300 MG capsule Take 1 capsule by mouth 3 (three) times daily.     losartan (COZAAR) 25 MG tablet Take 25 mg by mouth daily.     methotrexate 250 MG/10ML injection Take 0.5  ml l SQ once a week x 12  Weeks     methotrexate 50 MG/2ML injection Inject 12.5 mg into the vein every Thursday. (0.5 ml)     omeprazole (PRILOSEC) 20 MG capsule Take 20 mg by mouth daily before breakfast.      oxybutynin (DITROPAN-XL) 10 MG 24 hr tablet Take 1 tablet (10 mg total) by mouth daily. 30 tablet 1   pantoprazole (PROTONIX) 40 MG tablet Take by mouth.     TUBERCULIN SYR 1CC/27GX1/2" 27G X 1/2" 1 ML MISC Use once a week for methotrexate injections.     labetalol (NORMODYNE) 100 MG tablet Take 1 tablet (100 mg total) by mouth 2 (two) times daily. 180 tablet 3   No current facility-administered medications for this visit.      Marland Kitchen  PHYSICAL EXAMINATION:  Vitals:   05/22/21 1040  BP: (!) 128/55  Pulse: 76  Resp: 20  Temp: 97.6 F (36.4 C)  SpO2: 99%   Filed Weights   05/22/21 1038  Weight: 253 lb 12.8 oz (115.1 kg)    Physical Exam Vitals and nursing note  reviewed.  Constitutional:      Comments: Ambulating: Independently  Accompanied: Alone  HENT:     Head: Normocephalic and atraumatic.     Mouth/Throat:     Pharynx: Oropharynx is clear.  Eyes:     Extraocular Movements: Extraocular movements intact.     Pupils: Pupils are equal, round, and reactive to light.  Cardiovascular:     Rate and Rhythm: Normal rate and regular rhythm.  Pulmonary:     Comments: Decreased breath sounds bilaterally.  Abdominal:     Palpations: Abdomen is soft.  Musculoskeletal:        General: Normal range of motion.     Cervical back: Normal range of motion.  Skin:    General: Skin is warm.  Neurological:     General: No focal deficit present.     Mental Status: She is alert and oriented to person, place, and time.  Psychiatric:        Behavior: Behavior normal.        Judgment: Judgment normal.     LABORATORY DATA:  I have reviewed the data as listed Lab Results  Component Value Date   WBC 6.1 05/22/2021   HGB 11.3 (L) 05/22/2021   HCT 36.2 05/22/2021   MCV 92.1 05/22/2021   PLT 148 (L) 05/22/2021   Recent Labs    09/18/20 0813 01/19/21 0942 05/22/21 0941  NA 139 138 138  K 4.1 4.2 3.8  CL 102 105 104  CO2 26 27 27   GLUCOSE 92 97 126*  BUN 27* 35* 30*  CREATININE 1.38* 1.78* 1.37*  CALCIUM 9.1 9.2 8.8*  GFRNONAA 41* 30* 41*  PROT 7.0 7.9 7.1  ALBUMIN 3.8 4.3 3.8  AST 19 29 21   ALT 12 22 16   ALKPHOS 82 94 94  BILITOT 0.7 0.6 0.4    RADIOGRAPHIC STUDIES: I have personally reviewed the radiological images as listed and agreed with the findings in the report. MM 3D SCREEN BREAST BILATERAL  Result Date: 04/30/2021 CLINICAL DATA:  Screening. EXAM: DIGITAL SCREENING BILATERAL MAMMOGRAM  WITH TOMOSYNTHESIS AND CAD TECHNIQUE: Bilateral screening digital craniocaudal and mediolateral oblique mammograms were obtained. Bilateral screening digital breast tomosynthesis was performed. The images were evaluated with computer-aided  detection. COMPARISON:  Previous exam(s). ACR Breast Density Category b: There are scattered areas of fibroglandular density. FINDINGS: There are no findings suspicious for malignancy. IMPRESSION: No mammographic evidence of malignancy. A result letter of this screening mammogram will be mailed directly to the patient. RECOMMENDATION: Screening mammogram in one year. (Code:SM-B-01Y) BI-RADS CATEGORY  1: Negative. Electronically Signed   By: Margarette Canada M.D.   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 No evidence of right lower extremity acute DVT.   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  Monitor for now  # Normocytic anemia- improved hemoglobin 11-12 /CKD [iron saturation 22%]- STABLE; recommend PO iron every other day.   # CKD stage III GFR ~30 [f/u nephrology; Dr.Berkoben[facility] ]  # lower abdominal pain/ pelvic pain-CT scan September 2021-negative for any malignancies [patient is concerned] recent UA-negative; unclear etiology.  No constipation.    Defer to PCP for further work-up/management.  # RA- Dr.Patel.   # DISPOSITION: # follow 6 months for MD assessment and labs [facility] with diff,CMP, ferritin, iron studies]- Dr.B  All questions were answered. The patient knows to call the clinic with any problems, questions or concerns.    Cammie Sickle, MD 05/22/2021 12:08 PM

## 2021-05-22 NOTE — Progress Notes (Signed)
Patient here for follow up today with labs. She states that she has developed a strange weakness in her torso, which makes her feel like she is going to "pass out", so when it happens periodically, she has to lie down. She denies having any pain at this moment, but she has been having some back pain and is currently attending physical therapy sessions.

## 2021-05-22 NOTE — Patient Instructions (Signed)
Recommend one iron pill [65 mg over the counter] every other day.

## 2021-06-15 NOTE — Progress Notes (Deleted)
06/18/2021 11:43 AM   Ulysees Barns Gusman 1945-11-21 [id number]  Referring provider: Sofie Hartigan, [address] [place],  [place] [address]  No chief complaint on file.  Urological history  1. Urge incontinence -contributing factors of age, obesity, vaginal atrophy, anxiety, arthritis and pelvic surgery -PVR ***  2. OAB -contributing factors of age, vaginal atrophy, HTN, anxiety and sugary drinks -CT 2021 - NED for urological issues  HPI: Alfreida Steffenhagen is a 75 y.o. who presents today after a 73 week trial of oxybutynin XL 10 mg daily.        PMH: Past Medical History:  Diagnosis Date   Anemia    vitamin b and d deficiency   Anxiety    Arthritis    osteoarthrits   Chronic pain syndrome    CKD (chronic kidney disease) stage 3, GFR 30-59 ml/min (HCC)    Complication of anesthesia    difficulty waking up   DDD (degenerative disc disease), cervical    Fibromyalgia    not accurate, per patient   GERD (gastroesophageal reflux disease)    Gout    Hearing loss    History of kidney stones 1987   Hypertension    Pulmonary embolism (HCC)    Rheumatoid arthritis ([place])    orencia infusions   Vitamin B12 deficiency    Vitamin D deficiency     Surgical History: Past Surgical History:  Procedure Laterality Date   ABDOMINAL HYSTERECTOMY     ABDOMINAL HYSTERECTOMY     ANKLE FRACTURE SURGERY Right [facility], [place], [place].   METAL IN ANKLE   BREAST BIOPSY Right 02/09/2019   affirm bx of calcs, ribbon marker neg   CESAREAN SECTION     CHOLECYSTECTOMY     COLONOSCOPY WITH PROPOFOL N/A 12/12/2020   Procedure: COLONOSCOPY WITH PROPOFOL;  Surgeon: Lesly Rubenstein, MD;  Location: [facility] ENDOSCOPY;  Service: Endoscopy;  Laterality: N/A;   ESOPHAGOGASTRODUODENOSCOPY N/A 12/12/2020   Procedure: ESOPHAGOGASTRODUODENOSCOPY (EGD);  Surgeon: Lesly Rubenstein, MD;  Location: [facility] ENDOSCOPY;  Service: Endoscopy;  Laterality: N/A;   EYE SURGERY Bilateral  2015   cataract surgery, [place], [place]   KNEE ARTHROSCOPY     PULMONARY THROMBECTOMY N/A 03/10/2020   Procedure: PULMONARY THROMBECTOMY;  Surgeon: Algernon Huxley, MD;  Location: [facility];  Service: Cardiovascular;  Laterality: N/A;   TONSILLECTOMY     TOTAL KNEE ARTHROPLASTY Left 09/21/2019   Procedure: TOTAL KNEE ARTHROPLASTY;  Surgeon: Corky Mull, MD;  Location: [facility] ORS;  Service: Orthopedics;  Laterality: Left;   TUBAL LIGATION      Home Medications:  Allergies as of 06/18/2021       Reactions   Penicillins Hives        Medication List        Accurate as of June 15, 2021 11:43 AM. If you have any questions, ask your nurse or doctor.          abatacept 250 MG injection Commonly known as: ORENCIA Inject into the vein. Once a month   allopurinol 300 MG tablet Commonly known as: ZYLOPRIM Take 300 mg by mouth daily. In the morning   atomoxetine 60 MG capsule Commonly known as: STRATTERA Take 60 mg by mouth every morning.   atorvastatin 40 MG tablet Commonly known as: LIPITOR Take 1 tablet (40 mg total) by mouth daily.   busPIRone 15 MG tablet Commonly known as: BUSPAR Take 15 mg by mouth daily.  chlorthalidone 25 MG tablet Commonly known as: HYGROTON Take 12.5 mg by mouth daily.   diazepam 10 MG tablet Commonly known as: VALIUM Take by mouth.   Eliquis 5 MG Tabs tablet Generic drug: apixaban TAKE ONE TABLET BY MOUTH TWICE A DAY   fluticasone 50 MCG/ACT nasal spray Commonly known as: FLONASE Place 2 sprays into both nostrils daily.   folic acid 161 MCG tablet Commonly known as: FOLVITE Take 400 mcg by mouth daily.   gabapentin 300 MG capsule Commonly known as: NEURONTIN Take 1 capsule by mouth 3 (three) times daily.   labetalol 100 MG tablet Commonly known as: NORMODYNE Take 1 tablet (100 mg total) by mouth 2 (two) times daily.   losartan 25 MG tablet Commonly known as: COZAAR Take 25 mg by mouth daily.   methotrexate 50  MG/2ML injection Inject 12.5 mg into the vein every Thursday. (0.5 ml)   methotrexate 250 MG/10ML injection Take 0.5  ml l SQ once a week x 12  Weeks   omeprazole 20 MG capsule Commonly known as: PRILOSEC Take 20 mg by mouth daily before breakfast.   oxybutynin 10 MG 24 hr tablet Commonly known as: DITROPAN-XL Take 1 tablet (10 mg total) by mouth daily.   pantoprazole 40 MG tablet Commonly known as: PROTONIX Take by mouth.   TUBERCULIN SYR 1CC/27GX1/2" 27G X 1/2" 1 ML Misc Use once a week for methotrexate injections.   Vitamin B-12 5000 MCG Tbdp Take 5,000 mcg by mouth daily.        Allergies:  Allergies  Allergen Reactions   Penicillins Hives    Family History: Family History  Problem Relation Age of Onset   Breast cancer Other 53   Hypertension Mother    Migraines Mother    Other Father    Lung cancer Sister     Social History:  reports that she has never smoked. She has never used smokeless tobacco. She reports that she does not drink alcohol and does not use drugs.  ROS: Pertinent ROS in HPI  Physical Exam: There were no vitals taken for this visit.  Constitutional:  Well nourished. Alert and oriented, No acute distress. HEENT: [place] AT, moist mucus membranes.  Trachea midline, no masses. Cardiovascular: No clubbing, cyanosis, or edema. Respiratory: Normal respiratory effort, no increased work of breathing. GI: Abdomen is soft, non tender, non distended, no abdominal masses. Liver and spleen not palpable.  No hernias appreciated.  Stool sample for occult testing is not indicated.   GU: No CVA tenderness.  No bladder fullness or masses.  *** external genitalia, *** pubic hair distribution, no lesions.  Normal urethral meatus, no lesions, no prolapse, no discharge.   No urethral masses, tenderness and/or tenderness. No bladder fullness, tenderness or masses. *** vagina mucosa, *** estrogen effect, no discharge, no lesions, *** pelvic support, *** cystocele and  *** rectocele noted.  No cervical motion tenderness.  Uterus is freely mobile and non-fixed.  No adnexal/parametria masses or tenderness noted.  Anus and perineum are without rashes or lesions.   ***  Skin: No rashes, bruises or suspicious lesions. Lymph: No cervical or inguinal adenopathy. Neurologic: Grossly intact, no focal deficits, moving all 4 extremities. Psychiatric: Normal mood and affect.    Laboratory Data: Lab Results  Component Value Date   WBC 6.1 05/22/2021   HGB 11.3 (L) 05/22/2021   HCT 36.2 05/22/2021   MCV 92.1 05/22/2021   PLT 148 (L) 05/22/2021    Lab Results  Component Value Date  CREATININE 1.37 (H) 05/22/2021    Lab Results  Component Value Date   AST 21 05/22/2021   Lab Results  Component Value Date   ALT 16 05/22/2021   Urinalysis    Component Value Date/Time   COLORURINE YELLOW 05/08/2021 1009   APPEARANCEUR HAZY (A) 05/08/2021 1009   LABSPEC 1.025 05/08/2021 1009   PHURINE 5.5 05/08/2021 1009   [place] 05/08/2021 1009   HGBUR TRACE (A) 05/08/2021 1009   BILIRUBINUR NEGATIVE 05/08/2021 1009   [place] 05/08/2021 1009   PROTEINUR NEGATIVE 05/08/2021 1009   NITRITE NEGATIVE 05/08/2021 1009   [place] 05/08/2021 1009  I have reviewed the labs.   Pertinent Imaging: ***   Assessment & Plan:    1. Urge incontinence ***  2. OAB ***  No follow-ups on file.  These notes generated with voice recognition software. I apologize for typographical errors.  Zara Council, PA-C  [facility] [address]  [place], [place] [address] [phone]

## 2021-06-18 ENCOUNTER — Ambulatory Visit: Payer: Medicare Other | Admitting: Urology

## 2021-06-18 DIAGNOSIS — N3941 Urge incontinence: Secondary | ICD-10-CM

## 2021-06-18 DIAGNOSIS — N3281 Overactive bladder: Secondary | ICD-10-CM

## 2021-07-26 ENCOUNTER — Other Ambulatory Visit: Payer: Self-pay | Admitting: Urology

## 2021-07-26 DIAGNOSIS — N3281 Overactive bladder: Secondary | ICD-10-CM

## 2021-07-26 DIAGNOSIS — R32 Unspecified urinary incontinence: Secondary | ICD-10-CM

## 2021-08-02 ENCOUNTER — Telehealth: Payer: Self-pay | Admitting: Urology

## 2021-08-02 NOTE — Telephone Encounter (Signed)
Please call patient and schedule an appt w/ PA for follow up. She will need to schedule before receiving further refills.

## 2021-08-02 NOTE — Telephone Encounter (Signed)
Pt left VM on triage asking about refill, pt needs appt.  .left message to have patient return my call.

## 2021-08-02 NOTE — Telephone Encounter (Signed)
Pt needs a refill of Oxybutynin sent in to [facility] in [place].  She is almost out.  She would also like future refills for 90 day supply sent to [facility] mail order.

## 2021-08-10 NOTE — Progress Notes (Deleted)
08/13/2021 10:07 AM   Sabrina Green Nov 30, 1945 [id number]  Referring provider: Sofie Hartigan, [place],  [place] [address]  No chief complaint on file.  Urological history: 1. Urge incontinence -contributing factors of age, vaginal atrophy, obesity, anxiety, arthritis, degenerative disc disease, hypertension, pelvic surgery and benzodiazepines -PVR *** -managed with oxybutynin XL 10 mg daily   2. OAB -see # 1   HPI: Sabrina Green is a 76 y.o. female who presents today for medication refill.  PVR ***    PMH: Past Medical History:  Diagnosis Date   Anemia    vitamin b and d deficiency   Anxiety    Arthritis    osteoarthrits   Chronic pain syndrome    CKD (chronic kidney disease) stage 3, GFR 30-59 ml/min (HCC)    Complication of anesthesia    difficulty waking up   DDD (degenerative disc disease), cervical    Fibromyalgia    not accurate, per patient   GERD (gastroesophageal reflux disease)    Gout    Hearing loss    History of kidney stones 1987   Hypertension    Pulmonary embolism (HCC)    Rheumatoid arthritis ([place])    orencia infusions   Vitamin B12 deficiency    Vitamin D deficiency     Surgical History: Past Surgical History:  Procedure Laterality Date   ABDOMINAL HYSTERECTOMY     ABDOMINAL HYSTERECTOMY     ANKLE FRACTURE SURGERY Right [facility], [place], [place].   METAL IN ANKLE   BREAST BIOPSY Right 02/09/2019   affirm bx of calcs, ribbon marker neg   CESAREAN SECTION     CHOLECYSTECTOMY     COLONOSCOPY WITH PROPOFOL N/A 12/12/2020   Procedure: COLONOSCOPY WITH PROPOFOL;  Surgeon: Lesly Rubenstein, MD;  Location: [facility] ENDOSCOPY;  Service: Endoscopy;  Laterality: N/A;   ESOPHAGOGASTRODUODENOSCOPY N/A 12/12/2020   Procedure: ESOPHAGOGASTRODUODENOSCOPY (EGD);  Surgeon: Lesly Rubenstein, MD;  Location: [facility] ENDOSCOPY;  Service: Endoscopy;  Laterality: N/A;   EYE SURGERY Bilateral 2015   cataract  surgery, [place], [place]   KNEE ARTHROSCOPY     PULMONARY THROMBECTOMY N/A 03/10/2020   Procedure: PULMONARY THROMBECTOMY;  Surgeon: Algernon Huxley, MD;  Location: [facility];  Service: Cardiovascular;  Laterality: N/A;   TONSILLECTOMY     TOTAL KNEE ARTHROPLASTY Left 09/21/2019   Procedure: TOTAL KNEE ARTHROPLASTY;  Surgeon: Corky Mull, MD;  Location: [facility] ORS;  Service: Orthopedics;  Laterality: Left;   TUBAL LIGATION      Home Medications:  Allergies as of 08/13/2021       Reactions   Penicillins Hives        Medication List        Accurate as of August 10, 2021 10:07 AM. If you have any questions, ask your nurse or doctor.          abatacept 250 MG injection Commonly known as: ORENCIA Inject into the vein. Once a month   allopurinol 300 MG tablet Commonly known as: ZYLOPRIM Take 300 mg by mouth daily. In the morning   atomoxetine 60 MG capsule Commonly known as: STRATTERA Take 60 mg by mouth every morning.   atorvastatin 40 MG tablet Commonly known as: LIPITOR Take 1 tablet (40 mg total) by mouth daily.   busPIRone 15 MG tablet Commonly known as: BUSPAR Take 15 mg by mouth daily.   diazepam 10 MG tablet Commonly known as: VALIUM Take by  mouth.   Eliquis 5 MG Tabs tablet Generic drug: apixaban TAKE ONE TABLET BY MOUTH TWICE A DAY   fluticasone 50 MCG/ACT nasal spray Commonly known as: FLONASE Place 2 sprays into both nostrils daily.   folic acid 793 MCG tablet Commonly known as: FOLVITE Take 400 mcg by mouth daily.   gabapentin 300 MG capsule Commonly known as: NEURONTIN Take 1 capsule by mouth 3 (three) times daily.   labetalol 100 MG tablet Commonly known as: NORMODYNE Take 1 tablet (100 mg total) by mouth 2 (two) times daily.   losartan 25 MG tablet Commonly known as: COZAAR Take 25 mg by mouth daily.   methotrexate 50 MG/2ML injection Inject 12.5 mg into the vein every Thursday. (0.5 ml)   methotrexate 250 MG/10ML  injection Take 0.5  ml l SQ once a week x 12  Weeks   omeprazole 20 MG capsule Commonly known as: PRILOSEC Take 20 mg by mouth daily before breakfast.   oxybutynin 10 MG 24 hr tablet Commonly known as: DITROPAN-XL Take 1 tablet (10 mg total) by mouth daily.   pantoprazole 40 MG tablet Commonly known as: PROTONIX Take by mouth.   TUBERCULIN SYR 1CC/27GX1/2" 27G X 1/2" 1 ML Misc Use once a week for methotrexate injections.   Vitamin B-12 5000 MCG Tbdp Take 5,000 mcg by mouth daily.        Allergies:  Allergies  Allergen Reactions   Penicillins Hives    Family History: Family History  Problem Relation Age of Onset   Breast cancer Other 66   Hypertension Mother    Migraines Mother    Other Father    Lung cancer Sister     Social History:  reports that she has never smoked. She has never used smokeless tobacco. She reports that she does not drink alcohol and does not use drugs.  ROS: Pertinent ROS in HPI  Physical Exam: There were no vitals taken for this visit.  Constitutional:  Well nourished. Alert and oriented, No acute distress. HEENT: [place] AT, moist mucus membranes.  Trachea midline, no masses. Cardiovascular: No clubbing, cyanosis, or edema. Respiratory: Normal respiratory effort, no increased work of breathing. GI: Abdomen is soft, non tender, non distended, no abdominal masses. Liver and spleen not palpable.  No hernias appreciated.  Stool sample for occult testing is not indicated.   GU: No CVA tenderness.  No bladder fullness or masses.  *** external genitalia, *** pubic hair distribution, no lesions.  Normal urethral meatus, no lesions, no prolapse, no discharge.   No urethral masses, tenderness and/or tenderness. No bladder fullness, tenderness or masses. *** vagina mucosa, *** estrogen effect, no discharge, no lesions, *** pelvic support, *** cystocele and *** rectocele noted.  No cervical motion tenderness.  Uterus is freely mobile and non-fixed.  No  adnexal/parametria masses or tenderness noted.  Anus and perineum are without rashes or lesions.   ***  Skin: No rashes, bruises or suspicious lesions. Lymph: No cervical or inguinal adenopathy. Neurologic: Grossly intact, no focal deficits, moving all 4 extremities. Psychiatric: Normal mood and affect.    Laboratory Data: Lab Results  Component Value Date   WBC 6.1 05/22/2021   HGB 11.3 (L) 05/22/2021   HCT 36.2 05/22/2021   MCV 92.1 05/22/2021   PLT 148 (L) 05/22/2021    Lab Results  Component Value Date   CREATININE 1.37 (H) 05/22/2021    Lab Results  Component Value Date   AST 21 05/22/2021   Lab Results  Component  Value Date   ALT 16 05/22/2021    Urinalysis    Component Value Date/Time   COLORURINE YELLOW 05/08/2021 1009   APPEARANCEUR HAZY (A) 05/08/2021 1009   LABSPEC 1.025 05/08/2021 1009   PHURINE 5.5 05/08/2021 1009   [place] 05/08/2021 1009   [place] (A) 05/08/2021 1009   BILIRUBINUR NEGATIVE 05/08/2021 1009   [place] 05/08/2021 1009   PROTEINUR NEGATIVE 05/08/2021 1009   NITRITE NEGATIVE 05/08/2021 1009   [place] 05/08/2021 1009  I have reviewed the labs.   Pertinent Imaging: ***  Assessment & Plan:  ***  1. Urge incontinence ***  2. OAB ***  No follow-ups on file.  These notes generated with voice recognition software. I apologize for typographical errors.  Zara Council, PA-C  [facility] [address]  [place], [place] [address] [phone]

## 2021-08-13 ENCOUNTER — Ambulatory Visit: Payer: Medicare Other | Admitting: Urology

## 2021-08-13 DIAGNOSIS — N3941 Urge incontinence: Secondary | ICD-10-CM

## 2021-08-13 DIAGNOSIS — N3281 Overactive bladder: Secondary | ICD-10-CM

## 2021-08-31 NOTE — Progress Notes (Signed)
09/03/21 11:19 AM   Sabrina Green 20-Feb-1946 [id number]  Referring provider:  Sofie Hartigan, [place],  [place] [address] Chief Complaint  Patient presents with   Medication Refill    Urological history: OAB symptoms/ urge incontinence  - She was last seen in clinic on 05/08/2021 with Dr.Sninsky  - Tried on trial oxybutynin 10 mg XL   HPI: Sabrina Green is a 76 y.o.female who presents today for office visit and medication refill.   4-7 times she rushes to the bathroom worried she is not going to make it, 4-7 times daily urination, urinating 0-3 x nightly. Leaking 4-7x a week, she is engaging in toilet mapping, she limits her fluid intake 0-4 x weekly.   She reports that she had improvement on oxybutynin. She has not had this medication for about a month or more. She filled out her OAB questionnaire which are her symptoms without oxybutynin. She gets to the bathroom in time and she has no leakage, and wakes up dry at night when she is on oxybutynin. She has dry mouth she is unsure if this is due to oxybutynin.   She has constipation she reports that she takes iron tablets which are contributing to her constipation.   She reports that due to her age she has forgotten a few things overtime such as her doctors appointments. She does not believe oxybutynin is the cause of her memory/ forgetfulness and she denies it getting worse on oxybutynin.     PMH: Past Medical History:  Diagnosis Date   Anemia    vitamin b and d deficiency   Anxiety    Arthritis    osteoarthrits   Chronic pain syndrome    CKD (chronic kidney disease) stage 3, GFR 30-59 ml/min (HCC)    Complication of anesthesia    difficulty waking up   DDD (degenerative disc disease), cervical    Fibromyalgia    not accurate, per patient   GERD (gastroesophageal reflux disease)    Gout    Hearing loss    History of kidney stones 1987   Hypertension    Pulmonary embolism (HCC)     Rheumatoid arthritis ([place])    orencia infusions   Vitamin B12 deficiency    Vitamin D deficiency     Surgical History: Past Surgical History:  Procedure Laterality Date   ABDOMINAL HYSTERECTOMY     ABDOMINAL HYSTERECTOMY     ANKLE FRACTURE SURGERY Right [facility], [place], [place].   METAL IN ANKLE   BREAST BIOPSY Right 02/09/2019   affirm bx of calcs, ribbon marker neg   CESAREAN SECTION     CHOLECYSTECTOMY     COLONOSCOPY WITH PROPOFOL N/A 12/12/2020   Procedure: COLONOSCOPY WITH PROPOFOL;  Surgeon: Lesly Rubenstein, MD;  Location: [facility] ENDOSCOPY;  Service: Endoscopy;  Laterality: N/A;   ESOPHAGOGASTRODUODENOSCOPY N/A 12/12/2020   Procedure: ESOPHAGOGASTRODUODENOSCOPY (EGD);  Surgeon: Lesly Rubenstein, MD;  Location: [facility] ENDOSCOPY;  Service: Endoscopy;  Laterality: N/A;   EYE SURGERY Bilateral 2015   cataract surgery, [place], [place]   KNEE ARTHROSCOPY     PULMONARY THROMBECTOMY N/A 03/10/2020   Procedure: PULMONARY THROMBECTOMY;  Surgeon: Algernon Huxley, MD;  Location: [facility];  Service: Cardiovascular;  Laterality: N/A;   TONSILLECTOMY     TOTAL KNEE ARTHROPLASTY Left 09/21/2019   Procedure: TOTAL KNEE ARTHROPLASTY;  Surgeon: Corky Mull, MD;  Location: [facility] ORS;  Service: Orthopedics;  Laterality: Left;  TUBAL LIGATION      Home Medications:  Allergies as of 09/03/2021       Reactions   Penicillins Hives        Medication List        Accurate as of September 03, 2021 11:19 AM. If you have any questions, ask your nurse or doctor.          abatacept 250 MG injection Commonly known as: ORENCIA Inject into the vein. Once a month   allopurinol 300 MG tablet Commonly known as: ZYLOPRIM Take 300 mg by mouth daily. In the morning   atomoxetine 60 MG capsule Commonly known as: STRATTERA Take 60 mg by mouth every morning.   atorvastatin 40 MG tablet Commonly known as: LIPITOR Take 1 tablet (40 mg total) by mouth daily.    busPIRone 15 MG tablet Commonly known as: BUSPAR Take 15 mg by mouth daily.   diazepam 10 MG tablet Commonly known as: VALIUM Take by mouth.   Eliquis 5 MG Tabs tablet Generic drug: apixaban TAKE ONE TABLET BY MOUTH TWICE A DAY   fluticasone 50 MCG/ACT nasal spray Commonly known as: FLONASE Place 2 sprays into both nostrils daily.   folic acid 109 MCG tablet Commonly known as: FOLVITE Take 400 mcg by mouth daily.   gabapentin 300 MG capsule Commonly known as: NEURONTIN Take 1 capsule by mouth 3 (three) times daily.   labetalol 100 MG tablet Commonly known as: NORMODYNE Take 1 tablet (100 mg total) by mouth 2 (two) times daily.   losartan 25 MG tablet Commonly known as: COZAAR Take 25 mg by mouth daily.   methotrexate 50 MG/2ML injection Inject 12.5 mg into the vein every Thursday. (0.5 ml)   methotrexate 250 MG/10ML injection Take 0.5  ml l SQ once a week x 12  Weeks   omeprazole 20 MG capsule Commonly known as: PRILOSEC Take 20 mg by mouth daily before breakfast.   oxybutynin 10 MG 24 hr tablet Commonly known as: DITROPAN-XL Take 1 tablet (10 mg total) by mouth daily.   pantoprazole 40 MG tablet Commonly known as: PROTONIX Take by mouth.   TUBERCULIN SYR 1CC/27GX1/2" 27G X 1/2" 1 ML Misc Use once a week for methotrexate injections.   Vitamin B-12 5000 MCG Tbdp Take 5,000 mcg by mouth daily.        Allergies:  Allergies  Allergen Reactions   Penicillins Hives    Family History: Family History  Problem Relation Age of Onset   Breast cancer Other 80   Hypertension Mother    Migraines Mother    Other Father    Lung cancer Sister     Social History:  reports that she has never smoked. She has never used smokeless tobacco. She reports that she does not drink alcohol and does not use drugs.   Physical Exam: BP (!) 154/70    Pulse 87    Ht 5\' 5"  (1.651 m)    Wt 251 lb (113.9 kg)    BMI 41.77 kg/m   Constitutional:  Alert and oriented, No  acute distress. HEENT: [place] AT, moist mucus membranes.  Trachea midline, no masses. Cardiovascular: No clubbing, cyanosis, or edema. Respiratory: Normal respiratory effort, no increased work of breathing. Skin: No rashes, bruises or suspicious lesions. Neurologic: Grossly intact, no focal deficits, moving all 4 extremities. Psychiatric: Normal mood and affect.  Laboratory Data:  Lab Results  Component Value Date   CREATININE 1.37 (H) 05/22/2021    Ref Range & Units  2 mo ago  CK, Total (Creatine Kinase, Total) 38 - 234 U/L 115   Resulting Agency  [facility] - [facility]  Specimen Collected: 06/27/21 11:59 Last Resulted: 06/27/21 17:46  Received From: [facility]  Result Received: 07/26/21 08:49    Ref Range & Units 2 mo ago  Thyroid Stimulating Hormone (TSH) 0.450-5.330 uIU/ml uIU/mL 1.738   Comment: Reference Range for Pregnant Females >= 18 yrs old:  Normal Range for 1st trimester: 0.05-3.70 ulU/ml  Normal Range for 2nd trimester: 0.31-4.35 ulU/ml  Resulting Agency  [facility] - LAB  Specimen Collected: 06/27/21 11:59 Last Resulted: 06/27/21 17:48  Received From: [facility]  Result Received: 07/26/21 08:49   Component     Latest Ref Rng & Units 05/22/2021          WBC     4.0 - 10.5 K/uL 6.1  RBC     3.87 - 5.11 MIL/uL 3.93  Hemoglobin     12.0 - 15.0 g/dL 11.3 (L)  HCT     36.0 - 46.0 % 36.2  MCV     80.0 - 100.0 fL 92.1  MCH     26.0 - 34.0 pg 28.8  MCHC     30.0 - 36.0 g/dL 31.2  RDW     11.5 - 15.5 % 15.5  Platelets     150 - 400 K/uL 148 (L)  nRBC     0.0 - 0.2 % 0.0  Neutrophils     % 58  NEUT#     1.7 - 7.7 K/uL 3.6  Lymphocytes     % 31  Lymphocyte #     0.7 - 4.0 K/uL 1.9  Monocytes Relative     % 6  Monocyte #     0.1 - 1.0 K/uL 0.4  Eosinophil     % 4  Eosinophils Absolute     0.0 - 0.5 K/uL 0.2  Basophil     % 1  Basophils Absolute     0.0 - 0.1 K/uL 0.0  Immature Granulocytes     %  0  Abs Immature Granulocytes     0.00 - 0.07 K/uL 0.02   Component     Latest Ref Rng & Units 05/22/2021  Sodium     135 - 145 mmol/L 138  Potassium     3.5 - 5.1 mmol/L 3.8  Chloride     98 - 111 mmol/L 104  CO2     22 - 32 mmol/L 27  Glucose     70 - 99 mg/dL 126 (H)  BUN     8 - 23 mg/dL 30 (H)  Creatinine     0.44 - 1.00 mg/dL 1.37 (H)  Calcium     8.9 - 10.3 mg/dL 8.8 (L)  Total Protein     6.5 - 8.1 g/dL 7.1  Albumin     3.5 - 5.0 g/dL 3.8  AST     15 - 41 U/L 21  ALT     0 - 44 U/L 16  Alkaline Phosphatase     38 - 126 U/L 94  Total Bilirubin     0.3 - 1.2 mg/dL 0.4  GFR, Estimated     >60 mL/min 41 (L)  Anion gap     5 - 15 7   Pertinent Imaging: Results for orders placed or performed in visit on 09/03/21  BLADDER SCAN AMB NON-IMAGING  Result Value Ref Range  Scan Result 15ml    Assessment & Plan:   OAB symptoms/ urge incontinence  - Symptoms improved on oxybutynin 10 mg XL  - Prescription refilled today to [facility] - She is emptying adequately today with PVR of 21 mL.    Return in about 3 months (around 12/01/2021) for PVR and OAB questionnaire.  [facility] [address] [place], [place] [address] [phone]  I,Kailey Littlejohn,acting as a scribe for [facility], PA-C.,have documented all relevant documentation on the behalf of Elene Downum, PA-C,as directed by  [facility], PA-C while in the presence of [place], PA-C.

## 2021-09-03 ENCOUNTER — Other Ambulatory Visit: Payer: Self-pay

## 2021-09-03 ENCOUNTER — Ambulatory Visit (INDEPENDENT_AMBULATORY_CARE_PROVIDER_SITE_OTHER): Payer: Medicare Other | Admitting: Urology

## 2021-09-03 ENCOUNTER — Encounter: Payer: Self-pay | Admitting: Urology

## 2021-09-03 VITALS — BP 154/70 | HR 87 | Ht 65.0 in | Wt 251.0 lb

## 2021-09-03 DIAGNOSIS — R32 Unspecified urinary incontinence: Secondary | ICD-10-CM | POA: Diagnosis not present

## 2021-09-03 DIAGNOSIS — N3281 Overactive bladder: Secondary | ICD-10-CM

## 2021-09-03 DIAGNOSIS — N3941 Urge incontinence: Secondary | ICD-10-CM

## 2021-09-03 LAB — BLADDER SCAN AMB NON-IMAGING

## 2021-09-03 MED ORDER — OXYBUTYNIN CHLORIDE ER 10 MG PO TB24: 10.0000 mg | ORAL_TABLET | Freq: Every day | ORAL | 3 refills | Status: AC

## 2021-10-05 ENCOUNTER — Other Ambulatory Visit: Payer: Self-pay

## 2021-10-05 ENCOUNTER — Encounter: Payer: Self-pay | Admitting: Cardiovascular Disease

## 2021-10-05 ENCOUNTER — Ambulatory Visit (INDEPENDENT_AMBULATORY_CARE_PROVIDER_SITE_OTHER): Payer: Medicare Other | Admitting: Cardiovascular Disease

## 2021-10-05 VITALS — BP 130/68 | HR 81 | Ht 65.0 in | Wt 249.0 lb

## 2021-10-05 DIAGNOSIS — G894 Chronic pain syndrome: Secondary | ICD-10-CM

## 2021-10-05 DIAGNOSIS — I2699 Other pulmonary embolism without acute cor pulmonale: Secondary | ICD-10-CM

## 2021-10-05 DIAGNOSIS — I824Z1 Acute embolism and thrombosis of unspecified deep veins of right distal lower extremity: Secondary | ICD-10-CM

## 2021-10-05 DIAGNOSIS — I10 Essential (primary) hypertension: Secondary | ICD-10-CM

## 2021-10-05 NOTE — Patient Instructions (Signed)
Medication Instructions:  No changes  If you need a refill on your cardiac medications before your next appointment, please call your pharmacy.   Lab work: No new labs needed  Testing/Procedures: No new testing needed  Follow-Up: At [facility], you and your health needs are our priority.  As part of our continuing mission to provide you with exceptional heart care, we have created designated Provider Care Teams.  These Care Teams include your primary Cardiologist (physician) and Advanced Practice Providers (APPs -  Physician Assistants and Nurse Practitioners) who all work together to provide you with the care you need, when you need it.  You will need a follow up appointment as needed  Providers on your designated Care Team:   Christopher Berge, NP Ryan Dunn, PA-C Cadence Furth, PA-C  COVID-19 Vaccine Information can be found at: [email or web address] For questions related to vaccine distribution or appointments, please email [email or web address] or call [phone].    

## 2021-10-05 NOTE — Progress Notes (Signed)
Cardiology Office Note  Date:  10/05/2021   ID:  Deneen, Slager 03-May-1946, MRN [id number]  PCP:  Sofie Hartigan, MD   Chief Complaint  Patient presents with   12 month follow up     "Doing well." Medications reviewed by the patient verbally.     HPI:  Sabrina Green is a 76 y.o. female with a hx of  PE/DVT with right heart strain s/p pulmonary thrombectomy and TPA 02/2020, CKD III,  RA,  anemia of chronic disease,  fibromyalgia,  Covid: 07/19/20 HTN, HLD, DDD, chronic back pain, OA, obesity Who presents for follow-up of her hypertension, chronic kidney disease, atypical chest pain, PVCs, moderately elevated right heart pressures  Last seen in clinic by myself March 2022  Moving to [place] in one month Reports her breathing is stable, Minimal stable leg swelling Regular walking program, weight stable  Denies any shortness of breath or chest pain on exertion Denies any tachypalpitations concerning for arrhythmia   Echocardiogram January 2022 Normal left ventricular function Moderately elevated right heart pressures with moderate tricuspid valve regurgitation Elevated right heart pressures felt secondary to prior PEs, and recent COVID   Started on Lasix at the time In follow-up she did not want to take Lasix, preferred to take small amounts thiazide diuretic/chlorthalidone  BMP  CR 1.38, BUN 27  EKG personally reviewed by myself on todays visit Normal sinus rhythm rate 81 bpm no significant ST-T wave changes  Followed by pain management for back and leg pain Deconditioned  Other past medical hx  left TKA in 08/2019 which was felt to be possibly contributory to DVT/PE.   [facility] 03/08/20-03/13/20 with dizziness, generalized malaise, fatigue, dyspnea. HS-troponin peaked at 714.  EKG with NSR and inferior and anterolateral TWI.  Echo with EF 55-60%, no RWMA, gr1DD, mildly reduced RVSF with severely enlraged RV cavity size and severely elevated PASP estimated at  64.63mHg, moderately dilated RA, mild TR.  D-dimer >7,500.   Chest CTA with bilateral large PE involving central pulmonary arteries extending into lobar branches.   Bilateral LE U/S with R popliteal DVT.  Underwent thrombolysis with TPA right pulmonary artery, left pulmonary artery, mechanical thrombectomy left upper lobe and left lower lobe pulmonary arteries also with thrombectomy right middle lobe and right lower lobe   Non contrast CT consistent with pulmonary hemorrhage.   She was discharged on supplemental oxygen recommended to f/u with PCP, hematology, cardiology.    PMH:   has a past medical history of Anemia, Anxiety, Arthritis, Chronic pain syndrome, CKD (chronic kidney disease) stage 3, GFR 30-59 ml/min (HCC), Complication of anesthesia, DDD (degenerative disc disease), cervical, Fibromyalgia, GERD (gastroesophageal reflux disease), Gout, Hearing loss, History of kidney stones (1987), Hypertension, Pulmonary embolism ([place], Rheumatoid arthritis ([place], Vitamin B12 deficiency, and Vitamin D deficiency.  PSH:    Past Surgical History:  Procedure Laterality Date   ABDOMINAL HYSTERECTOMY     ABDOMINAL HYSTERECTOMY     [place]   METAL IN [place]   BREAST BIOPSY Right 02/09/2019   affirm bx of calcs, ribbon marker neg   CESAREAN SECTION     CHOLECYSTECTOMY     COLONOSCOPY WITH PROPOFOL N/A 12/12/2020   Procedure: COLONOSCOPY WITH PROPOFOL;  Surgeon: LLesly Rubenstein MD;  Location: [facility] ENDOSCOPY;  Service: Endoscopy;  Laterality: N/A;   ESOPHAGOGASTRODUODENOSCOPY N/A 12/12/2020   Procedure: ESOPHAGOGASTRODUODENOSCOPY (EGD);  Surgeon: LLesly Rubenstein MD;  [facility];  Service:  Endoscopy;  Laterality: N/A;   EYE SURGERY Bilateral 2015   cataract surgery, [place], [place]   KNEE ARTHROSCOPY     PULMONARY THROMBECTOMY N/A 03/10/2020   Procedure: PULMONARY THROMBECTOMY;  Surgeon: Algernon Huxley, MD;   Location: [facility];  Service: Cardiovascular;  Laterality: N/A;   TONSILLECTOMY     TOTAL KNEE ARTHROPLASTY Left 09/21/2019   Procedure: TOTAL KNEE ARTHROPLASTY;  Surgeon: Corky Mull, MD;  Location: [facility] ORS;  Service: Orthopedics;  Laterality: Left;   TUBAL LIGATION      Current Outpatient Medications  Medication Sig Dispense Refill   abatacept (ORENCIA) 250 MG injection Inject into the vein. Once a month     allopurinol (ZYLOPRIM) 300 MG tablet Take 300 mg by mouth daily. In the morning     atomoxetine (STRATTERA) 60 MG capsule Take 60 mg by mouth every morning.     atorvastatin (LIPITOR) 40 MG tablet Take 1 tablet (40 mg total) by mouth daily. 90 tablet 3   busPIRone (BUSPAR) 15 MG tablet Take 15 mg by mouth daily.     chlorthalidone (HYGROTON) 25 MG tablet Take 25 mg by mouth daily.     Cyanocobalamin (VITAMIN B-12) 5000 MCG TBDP Take 5,000 mcg by mouth daily.     diazepam (VALIUM) 10 MG tablet Take by mouth.     ELIQUIS 5 MG TABS tablet TAKE ONE TABLET BY MOUTH TWICE A DAY 180 tablet 1   fluticasone (FLONASE) 50 MCG/ACT nasal spray Place 2 sprays into both nostrils daily.     folic acid (FOLVITE) 235 MCG tablet Take 400 mcg by mouth daily.     gabapentin (NEURONTIN) 300 MG capsule Take 1 capsule by mouth 3 (three) times daily.     labetalol (NORMODYNE) 100 MG tablet Take 1 tablet (100 mg total) by mouth 2 (two) times daily. 180 tablet 3   losartan (COZAAR) 25 MG tablet Take 25 mg by mouth daily.     methotrexate 50 MG/2ML injection Inject 12.5 mg into the vein every Thursday. (0.5 ml)     omeprazole (PRILOSEC) 20 MG capsule Take 20 mg by mouth daily before breakfast.      oxybutynin (DITROPAN-XL) 10 MG 24 hr tablet Take 1 tablet (10 mg total) by mouth daily. 90 tablet 3   TUBERCULIN SYR 1CC/27GX1/2" 27G X 1/2" 1 ML MISC Use once a week for methotrexate injections.     methotrexate 250 MG/10ML injection Take 0.5  ml l SQ once a week x 12  Weeks (Patient not taking:  Reported on 10/05/2021)     No current facility-administered medications for this visit.     Allergies:   Penicillins   Social History:  The patient  reports that she has never smoked. She has never used smokeless tobacco. She reports that she does not drink alcohol and does not use drugs.   Family History:   family history includes Breast cancer (age of onset: 32) in an other family member; Hypertension in her mother; Lung cancer in her sister; Migraines in her mother; Other in her father.    Review of Systems: Review of Systems  Constitutional: Negative.   HENT: Negative.    Respiratory: Negative.    Cardiovascular: Negative.   Gastrointestinal: Negative.   Musculoskeletal: Negative.   Neurological: Negative.   Psychiatric/Behavioral: Negative.    All other systems reviewed and are negative.  PHYSICAL EXAM: VS:  BP 130/68 (BP Location: Left Arm, Patient Position: Sitting, Cuff Size: Large)  Pulse 81    Ht '5\' 5"'$  (1.651 m)    Wt 249 lb (112.9 kg)    SpO2 98%    BMI 41.44 kg/m  , BMI Body mass index is 41.44 kg/m. Constitutional:  oriented to person, place, and time. No distress.  HENT:  Head: Grossly normal Eyes:  no discharge. No scleral icterus.  Neck: No JVD, no carotid bruits  Cardiovascular: Regular rate and rhythm, no murmurs appreciated Pulmonary/Chest: Clear to auscultation bilaterally, no wheezes or rails Abdominal: Soft.  no distension.  no tenderness.  Musculoskeletal: Normal range of motion Neurological:  normal muscle tone. Coordination normal. No atrophy Skin: Skin warm and dry Psychiatric: normal affect, pleasant  Recent Labs: 01/19/2021: Magnesium 1.8 05/22/2021: ALT 16; BUN 30; Creatinine, Ser 1.37; Hemoglobin 11.3; Platelets 148; Potassium 3.8; Sodium 138    Lipid Panel Lab Results  Component Value Date   CHOL 183 03/09/2020   HDL 46 03/09/2020   LDLCALC 121 (H) 03/09/2020   TRIG 80 03/09/2020      Wt Readings from Last 3 Encounters:   10/05/21 249 lb (112.9 kg)  09/03/21 251 lb (113.9 kg)  05/22/21 253 lb 12.8 oz (115.1 kg)     ASSESSMENT AND PLAN:  Problem List Items Addressed This Visit       Cardiology Problems   Bilateral pulmonary embolism (HCC) - Primary   Relevant Medications   chlorthalidone (HYGROTON) 25 MG tablet   Other Relevant Orders   EKG 12-Lead   DVT (deep venous thrombosis) (HCC)   Relevant Medications   chlorthalidone (HYGROTON) 25 MG tablet   Other Relevant Orders   EKG 12-Lead   Essential hypertension   Relevant Medications   chlorthalidone (HYGROTON) 25 MG tablet   Other Relevant Orders   EKG 12-Lead   Atypical chest pain  Denies any recent episodes of chest pain, no further work-up needed at this time   PVC - Asymptomatic   Bilateral PE s/p thrombolysis/right popliteal DVT -  in setting of left TKA in February 2021.  On Eliquis 5 twice daily Reports that she was "told by oncology" to stay on full dosing   Coronary artery calcification -  Continue statin, beta-blocker.    Chronic hypoxic respiratory failure  -in setting of PE.  Prior Covid, breathing has improved Declined Lasix in the past for elevated right heart pressures, preferred to stay on thiazide diuretic   CKDIII - CR 1.3  Avoid nephrotoxic agents.  Continue to follow with nephrology. No changes to her medications, avoid NSAIDs   HTN - Blood pressure is well controlled on today's visit. No changes made to the medications.   HLD  On statin Cholesterol at goal   Total encounter time more than 30 minutes  Greater than 50% was spent in counseling and coordination of care with the patient    Signed, Esmond Plants, M.D., Ph.D. [place], [place]

## 2021-11-03 ENCOUNTER — Ambulatory Visit
Admission: EM | Admit: 2021-11-03 | Discharge: 2021-11-03 | Disposition: A | Discharge status: Home / Self Care | Payer: Medicare Other | Attending: Student | Admitting: Student

## 2021-11-03 DIAGNOSIS — M545 Low back pain, unspecified: Secondary | ICD-10-CM

## 2021-11-03 MED ORDER — METHYLPREDNISOLONE SODIUM SUCC 40 MG IJ SOLR
60.0000 mg | Freq: Once | INTRAMUSCULAR | Status: AC
Start: 2021-11-03 — End: 2021-11-03
  Administered 2021-11-03: 60 mg via INTRAMUSCULAR

## 2021-11-03 MED ORDER — LIDOCAINE 4 % EX PTCH: 1.0000 | MEDICATED_PATCH | Freq: Every day | CUTANEOUS | 0 refills | Status: AC | PRN

## 2021-11-03 MED ORDER — CYCLOBENZAPRINE HCL 10 MG PO TABS: 10.0000 mg | ORAL_TABLET | Freq: Two times a day (BID) | ORAL | 0 refills | Status: AC | PRN

## 2021-11-03 NOTE — ED Provider Notes (Signed)
?[facility] ? ? ? ?CSN: [id number] ?Arrival date & time: 11/03/21  1512 ? ? ?  ? ?History   ?Chief Complaint ?Chief Complaint  ?Patient presents with  ? Back Pain  ? ? ?HPI ?Sabrina Green is a 76 y.o. female presenting with back pain.  History of fibromyalgia, degenerative disc disease, chronic pain syndrome, arthritis, chronic shoulder pain, chronic low back pain, lumbar facet syndrome, spondylosis, neurogenic pain, arthritis of multiple sites, rheumatoid arthritis, CKD.  Patient states that about 4 days ago on 4/5, she was disassembling a bed when the bed frame fell against her back.  Describes the bed frame as padded with wood edges, primarily she was hit with the padded side.  States that she did not fall following the incident, but did develop some right-sided lower back pain where she was hit.  Pain is worse with movement and standing.  Denies radiation of pain to the hip, to the spine, to the abdomen.  Has attempted Tylenol without relief.  Denies shortness of breath, chest pain, dizziness, weakness. ? ?HPI ? ?Past Medical History:  ?Diagnosis Date  ? Anemia   ? vitamin b and d deficiency  ? Anxiety   ? Arthritis   ? osteoarthrits  ? Chronic pain syndrome   ? CKD (chronic kidney disease) stage 3, GFR 30-59 ml/min (HCC)   ? Complication of anesthesia   ? difficulty waking up  ? DDD (degenerative disc disease), cervical   ? Fibromyalgia   ? not accurate, per patient  ? GERD (gastroesophageal reflux disease)   ? Gout   ? Hearing loss   ? History of kidney stones 1987  ? Hypertension   ? Pulmonary embolism ([place])   ? Rheumatoid arthritis ([place])   ? orencia infusions  ? Vitamin B12 deficiency   ? Vitamin D deficiency   ? ? ?Patient Active Problem List  ? Diagnosis Date Noted  ? Tricompartment osteoarthritis of knee (Right) 05/11/2020  ? Osteoarthritis of knee (Right) 05/11/2020  ? Chronic anticoagulation (Eliquis) 05/11/2020  ? History of deep vein thrombosis (DVT) of lower extremity 05/11/2020  ?  History of pulmonary embolism 05/11/2020  ? Normocytic anemia 04/03/2020  ? DVT (deep venous thrombosis) ([place]) 03/16/2020  ? Bilateral pulmonary embolism ([place]) 03/10/2020  ? Prolonged QT interval 03/08/2020  ? Thrombocytopenia ([place]) 03/08/2020  ? History of total knee replacement (Left) 09/21/2019  ? Osteoarthritis involving multiple joints 08/16/2019  ? Bursitis of left shoulder 02/22/2019  ? [place] arthritis 02/01/2019  ? Carpal tunnel syndrome on right 10/14/2018  ? Carpal tunnel syndrome, left 10/14/2018  ? Neurogenic pain 03/02/2018  ? Chronic low back pain (2ry area of Pain) (Bilateral) (L>R) w/o sciatica 02/04/2018  ? Lumbar facet syndrome (Bilateral) (L>R) 02/04/2018  ? Spondylosis without myelopathy or radiculopathy, lumbosacral region 02/04/2018  ? History of claustrophobia 01/05/2018  ? History of panic attacks 01/05/2018  ? Chronic shoulder pain (Bilateral) 01/05/2018  ? Osteoarthritis of shoulder (Bilateral) 01/05/2018  ? Personal history of other mental and behavioral disorders 01/05/2018  ? Elevated C-reactive protein (CRP) 12/15/2017  ? Chronic pain syndrome 12/11/2017  ? Pharmacologic therapy 12/11/2017  ? Disorder of skeletal system 12/11/2017  ? Problems influencing health status 12/11/2017  ? Chronic knee pain (Right) 12/11/2017  ? Chronic lower extremity pain (3ry area of Pain) (Left) 12/11/2017  ? Chronic shoulder pain (4th area of Pain) (Right) 12/11/2017  ? Chronic hip pain (Left) 12/11/2017  ? Fatty liver disease, nonalcoholic 94/76/5465  ? DDD (degenerative disc  disease), lumbar 06/02/2017  ? Chronic low back pain ([place]) 04/22/2017  ? CKD (chronic kidney disease) stage 3, GFR 30-59 ml/min 03/28/2017  ? Morbid obesity with BMI of 40.0-44.9, adult ([place]) 03/24/2017  ? Elevated erythrocyte sedimentation rate 02/12/2017  ? Shoulder bursitis (Right) 06/06/2016  ? Encounter for long-term (current) use of high-risk medication 03/06/2016  ? Long term current use of opiate analgesic 03/06/2016  ?  Chronic gout 02/26/2016  ? Essential hypertension 02/26/2016  ? Exercise-induced asthma 02/26/2016  ? Fibromyalgia 02/26/2016  ? GERD without esophagitis 02/26/2016  ? IBS (irritable bowel syndrome) 02/26/2016  ? Rheumatoid arthritis involving multiple sites ([place]) 02/26/2016  ? Vitamin B12 deficiency 02/26/2016  ? Vitamin D deficiency 02/26/2016  ? ? ?Past Surgical History:  ?Procedure Laterality Date  ? ABDOMINAL HYSTERECTOMY    ? ABDOMINAL HYSTERECTOMY    ? ANKLE FRACTURE SURGERY Right 1989  ? [place].   METAL IN ANKLE  ? BREAST BIOPSY Right 02/09/2019  ? affirm bx of calcs, ribbon marker neg  ? CESAREAN SECTION    ? CHOLECYSTECTOMY    ? COLONOSCOPY WITH PROPOFOL N/A 12/12/2020  ? Procedure: COLONOSCOPY WITH PROPOFOL;  Surgeon: Lesly Rubenstein, MD;  Location[facility]COPY;  Service: Endoscopy;  Laterality: N/A;  ? ESOPHAGOGASTRODUODENOSCOPY N/A 12/12/2020  ? Procedure: ESOPHAGOGASTRODUODENOSCOPY (EGD);  Surgeon: Lesly Rubenstein, MD;  [facility] ENDOSCOPY;  Service: Endoscopy;  Laterality: N/A;  ? EYE SURGERY Bilateral 2015  ? cataract surgery, [place], [place]  ? KNEE ARTHROSCOPY    ? PULMONARY THROMBECTOMY N/A 03/10/2020  ? Procedure: PULMONARY THROMBECTOMY;  Surgeon: Algernon Huxley, MD;  Location: [facility] CV LAB;  Service: Cardiovascular;  Laterality: N/A;  ? TONSILLECTOMY    ? TOTAL KNEE ARTHROPLASTY Left 09/21/2019  ? Procedure: TOTAL KNEE ARTHROPLASTY;  Surgeon: Corky Mull, MD;  Location: [facility] ORS;  Service: Orthopedics;  Laterality: Left;  ? TUBAL LIGATION    ? ? ?OB History   ?No obstetric history on file. ?  ? ? ? ?Home Medications   ? ?Prior to Admission medications   ?Medication Sig Start Date End Date Taking? Authorizing Provider  ?cyclobenzaprine (FLEXERIL) 10 MG tablet Take 1 tablet (10 mg total) by mouth 2 (two) times daily as needed for muscle spasms. 11/03/21  Yes Hazel Sams, PA-C  ?Lidocaine (HM LIDOCAINE PATCH) 4 % PTCH Apply 1 patch topically daily as  needed. 11/03/21  Yes Hazel Sams, PA-C  ?abatacept (ORENCIA) 250 MG injection Inject into the vein. Once a month    [provider]  ?allopurinol (ZYLOPRIM) 300 MG tablet Take 300 mg by mouth daily. In the morning    [provider]  ?atomoxetine (STRATTERA) 60 MG capsule Take 60 mg by mouth every morning. 09/19/20   [provider]  ?atorvastatin (LIPITOR) 40 MG tablet Take 1 tablet (40 mg total) by mouth daily. 04/27/20 10/05/21  Loel Dubonnet, NP  ?busPIRone (BUSPAR) 15 MG tablet Take 15 mg by mouth daily.    [provider]  ?chlorthalidone (HYGROTON) 25 MG tablet Take 25 mg by mouth daily. 10/01/21 10/01/22  [provider]  ?Cyanocobalamin (VITAMIN B-12) 5000 MCG TBDP Take 5,000 mcg by mouth daily.    [provider]  ?diazepam (VALIUM) 10 MG tablet Take by mouth. 11/17/20   [provider]  ?ELIQUIS 5 MG TABS tablet TAKE ONE TABLET BY MOUTH TWICE A DAY 11/23/20   Minna Merritts, MD  ?fluticasone (FLONASE) 50 MCG/ACT nasal spray Place  2 sprays into both nostrils daily.    [provider]  ?folic acid (FOLVITE) 650 MCG tablet Take 400 mcg by mouth daily.    [provider]  ?gabapentin (NEURONTIN) 300 MG capsule Take 1 capsule by mouth 3 (three) times daily. 04/20/21   [provider]  ?labetalol (NORMODYNE) 100 MG tablet Take 1 tablet (100 mg total) by mouth 2 (two) times daily. 04/27/20 10/05/21  Loel Dubonnet, NP  ?losartan (COZAAR) 25 MG tablet Take 25 mg by mouth daily. 06/07/20   [provider]  ?methotrexate 250 MG/10ML injection Take 0.5  ml l SQ once a week x 12  Weeks ?Patient not taking: Reported on 10/05/2021 08/21/20   [provider]  ?methotrexate 50 MG/2ML injection Inject 12.5 mg into the vein every Thursday. (0.5 ml)    [provider]  ?omeprazole (PRILOSEC) 20 MG capsule Take 20 mg by mouth daily before breakfast.     [provider]  ?oxybutynin (DITROPAN-XL) 10 MG  24 hr tablet Take 1 tablet (10 mg total) by mouth daily. 09/03/21   Nori Riis, PA-C  ?TUBERCULIN SYR 1CC/27GX1/2" 27G X 1/2" 1 ML MISC Use once a week for methotrexate injections. 12/19/20   Provider,

## 2021-11-03 NOTE — ED Triage Notes (Signed)
Pt present upper back pain from a headboard falling on her back on Wednesday. Pt states she feels the pain all on her right side.  ?

## 2021-11-03 NOTE — Discharge Instructions (Addendum)
-  Flexeril up to twice daily for musculoskeletal pain, this medication will make you sleepy so it is best to take at bedtime. ?-Lidocaine patch for pain, apply directly to the side of the muscle spasms.  Do not use heating pad while using lidocaine patch. ?-Follow-up with primary care provider in 2 days if symptoms worsen or persist, or if new symptoms like shortness of breath, weakness in the lower extremities. ?-Tylenol for pain  ?

## 2021-11-06 ENCOUNTER — Inpatient Hospital Stay: Payer: Medicare Other | Attending: Internal Medicine

## 2021-11-06 ENCOUNTER — Inpatient Hospital Stay (HOSPITAL_BASED_OUTPATIENT_CLINIC_OR_DEPARTMENT_OTHER): Payer: Medicare Other | Admitting: Internal Medicine

## 2021-11-06 ENCOUNTER — Encounter: Payer: Self-pay | Admitting: Internal Medicine

## 2021-11-06 DIAGNOSIS — D649 Anemia, unspecified: Secondary | ICD-10-CM | POA: Insufficient documentation

## 2021-11-06 DIAGNOSIS — N183 Chronic kidney disease, stage 3 unspecified: Secondary | ICD-10-CM | POA: Diagnosis not present

## 2021-11-06 DIAGNOSIS — D693 Immune thrombocytopenic purpura: Secondary | ICD-10-CM | POA: Diagnosis not present

## 2021-11-06 DIAGNOSIS — Z7901 Long term (current) use of anticoagulants: Secondary | ICD-10-CM | POA: Diagnosis not present

## 2021-11-06 DIAGNOSIS — I2699 Other pulmonary embolism without acute cor pulmonale: Secondary | ICD-10-CM | POA: Diagnosis not present

## 2021-11-06 DIAGNOSIS — R5383 Other fatigue: Secondary | ICD-10-CM | POA: Diagnosis not present

## 2021-11-06 DIAGNOSIS — Z86711 Personal history of pulmonary embolism: Secondary | ICD-10-CM | POA: Diagnosis present

## 2021-11-06 DIAGNOSIS — Z79899 Other long term (current) drug therapy: Secondary | ICD-10-CM | POA: Diagnosis not present

## 2021-11-06 DIAGNOSIS — Z86718 Personal history of other venous thrombosis and embolism: Secondary | ICD-10-CM | POA: Insufficient documentation

## 2021-11-06 DIAGNOSIS — M069 Rheumatoid arthritis, unspecified: Secondary | ICD-10-CM | POA: Diagnosis not present

## 2021-11-06 LAB — CBC WITH DIFFERENTIAL/PLATELET
Abs Immature Granulocytes: 0.03 10*3/uL (ref 0.00–0.07)
Basophils Absolute: 0 10*3/uL (ref 0.0–0.1)
Basophils Relative: 1 %
Eosinophils Absolute: 0.1 10*3/uL (ref 0.0–0.5)
Eosinophils Relative: 2 %
HCT: 37 % (ref 36.0–46.0)
Hemoglobin: 11.7 g/dL — ABNORMAL LOW (ref 12.0–15.0)
Immature Granulocytes: 0 %
Lymphocytes Relative: 34 %
Lymphs Abs: 2.5 10*3/uL (ref 0.7–4.0)
MCH: 29 pg (ref 26.0–34.0)
MCHC: 31.6 g/dL (ref 30.0–36.0)
MCV: 91.8 fL (ref 80.0–100.0)
Monocytes Absolute: 0.4 10*3/uL (ref 0.1–1.0)
Monocytes Relative: 5 %
Neutro Abs: 4.2 10*3/uL (ref 1.7–7.7)
Neutrophils Relative %: 58 %
Platelets: 144 10*3/uL — ABNORMAL LOW (ref 150–400)
RBC: 4.03 MIL/uL (ref 3.87–5.11)
RDW: 15.9 % — ABNORMAL HIGH (ref 11.5–15.5)
WBC: 7.2 10*3/uL (ref 4.0–10.5)
nRBC: 0 % (ref 0.0–0.2)

## 2021-11-06 LAB — COMPREHENSIVE METABOLIC PANEL
ALT: 28 U/L (ref 0–44)
AST: 32 U/L (ref 15–41)
Albumin: 4.1 g/dL (ref 3.5–5.0)
Alkaline Phosphatase: 87 U/L (ref 38–126)
Anion gap: 12 (ref 5–15)
BUN: 47 mg/dL — ABNORMAL HIGH (ref 8–23)
CO2: 24 mmol/L (ref 22–32)
Calcium: 9.4 mg/dL (ref 8.9–10.3)
Chloride: 101 mmol/L (ref 98–111)
Creatinine, Ser: 1.38 mg/dL — ABNORMAL HIGH (ref 0.44–1.00)
GFR, Estimated: 40 mL/min — ABNORMAL LOW (ref >60)
Glucose, Bld: 105 mg/dL — ABNORMAL HIGH (ref 70–99)
Potassium: 3.7 mmol/L (ref 3.5–5.1)
Sodium: 137 mmol/L (ref 135–145)
Total Bilirubin: 0.9 mg/dL (ref 0.3–1.2)
Total Protein: 7.6 g/dL (ref 6.5–8.1)

## 2021-11-06 LAB — FERRITIN: Ferritin: 270 ng/mL (ref 11–307)

## 2021-11-06 LAB — IRON AND TIBC
Iron: 65 ug/dL (ref 28–170)
Saturation Ratios: 20 % (ref 10.4–31.8)
TIBC: 328 ug/dL (ref 250–450)
UIBC: 263 ug/dL

## 2021-11-06 NOTE — Assessment & Plan Note (Addendum)
#   Bilateral submassive Pulmonary embolism [AUG 2021]-likely unprovoked/right lower extremity DVT popliteal [on Eliquis]-lifelong anticoagulation. 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 No evidence of right lower extremity acute DVT. STABLE ?? 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  Monitor for now. STABLE? ? ?# Normocytic anemia- improved hemoglobin 11-12 /CKD [iron saturation 22%]-recommend gentle PO iron every day. Overall STABLE.  ?? ?# CKD stage III GFR ~30 [f/u nephrology; Dr.Berkoben-[facility]].  ? ?# RA [[facility]]- Dr.Patel.  ? ?# DISPOSITION: ?# follow up as neede Dr.B ?

## 2021-11-06 NOTE — Progress Notes (Signed)
[facility] ?CONSULT NOTE ? ?Patient Care Team: ?Sofie Hartigan, MD as PCP - General (Family Medicine) ?Minna Merritts, MD as PCP - Cardiology (Cardiology) ? ?CHIEF COMPLAINTS/PURPOSE OF CONSULTATION: Bilateral PE ? ?# Bilateral submassive Pulmonary embolism /right lower extremity popliteal DVT [AUG 2021]- Hypercoagulable work-up revealed a borderline protein S antigen and a low protein S activity; otherwise unprovoked; on Eliquis 5 mg twice daily-indefinite ? ?#Mild thrombocytopenia platelets greater than 100 ? ?#CKD stage III ?          ?Oncology History  ? No history exists.  ? ?Hepatitis B core antibody, hepatitis C antibody, and HIV antibody were negative. ANA was negative. Factor V Leiden, prothrombin gene mutation, lupus anticoagulant panel, anti-cardiolipin antibodies were negative, and beta2-glycoprotein was negative.  Labs on 06/12/2020 revealed a normal protein S total antigen (93%), protein C total (81%), protein C activity (99%), and antithrombin III activity (78%).  Protein S free antigen was borderline (64%; 61-136%), and protein S activity was low (54%; 63-140%).  Labs on 09/18/2020 revealed a protein S activity of 62% (slightly low) and a protein S antigen of 90% and free 83%. ? ?HISTORY OF PRESENTING ILLNESS:  ?Sabrina Green 77 y.o.  female history of submassive PE bilateral submassive PE and right popliteal DVT (02/2020), anemia/CKD and thrombocytopenia w is here for follow-up. ? ?Patient moving to [facility] to be closer to family.  She continues to be compliant with her Eliquis. ? ?Patient denies any blood in stools or black or stools. Complains of chronic joint pains.  Complains of mild fatigue.  Continues to complain of intolerance to oral iron.  She is not taking oral iron ? ?Review of Systems  ?Constitutional:  Positive for malaise/fatigue. Negative for chills, diaphoresis, fever and weight loss.  ?HENT:  Negative for nosebleeds and sore throat.   ?Eyes:  Negative for  double vision.  ?Respiratory:  Negative for cough, hemoptysis, sputum production, shortness of breath and wheezing.   ?Cardiovascular:  Negative for chest pain, palpitations, orthopnea and leg swelling.  ?Gastrointestinal:  Positive for abdominal pain. Negative for blood in stool, constipation, diarrhea, heartburn, melena, nausea and vomiting.  ?Genitourinary:  Negative for dysuria, frequency and urgency.  ?Musculoskeletal:  Positive for back pain and joint pain.  ?Skin: Negative.  Negative for itching and rash.  ?Neurological:  Negative for dizziness, tingling, focal weakness, weakness and headaches.  ?Endo/Heme/Allergies:  Does not bruise/bleed easily.  ?Psychiatric/Behavioral:  Negative for depression. The patient is not nervous/anxious and does not have insomnia.    ? ?MEDICAL HISTORY:  ?Past Medical History:  ?Diagnosis Date  ? Anemia   ? vitamin b and d deficiency  ? Anxiety   ? Arthritis   ? osteoarthrits  ? Chronic pain syndrome   ? CKD (chronic kidney disease) stage 3, GFR 30-59 ml/min (HCC)   ? Complication of anesthesia   ? difficulty waking up  ? DDD (degenerative disc disease), cervical   ? Fibromyalgia   ? not accurate, per patient  ? GERD (gastroesophageal reflux disease)   ? Gout   ? Hearing loss   ? History of kidney stones 1987  ? Hypertension   ? Pulmonary embolism ([place])   ? Rheumatoid arthritis ([place])   ? orencia infusions  ? Vitamin B12 deficiency   ? Vitamin D deficiency   ? ? ?SURGICAL HISTORY: ?Past Surgical History:  ?Procedure Laterality Date  ? ABDOMINAL HYSTERECTOMY    ? ABDOMINAL HYSTERECTOMY    ? ANKLE FRACTURE SURGERY Right  1989  ? [facility], [place], [place].   METAL IN ANKLE  ? BREAST BIOPSY Right 02/09/2019  ? affirm bx of calcs, ribbon marker neg  ? CESAREAN SECTION    ? CHOLECYSTECTOMY    ? COLONOSCOPY WITH PROPOFOL N/A 12/12/2020  ? Procedure: COLONOSCOPY WITH PROPOFOL;  Surgeon: Lesly Rubenstein, MD;  Location: [facility] ENDOSCOPY;  Service: Endoscopy;  Laterality: N/A;  ?  ESOPHAGOGASTRODUODENOSCOPY N/A 12/12/2020  ? Procedure: ESOPHAGOGASTRODUODENOSCOPY (EGD);  Surgeon: Lesly Rubenstein, MD;  Location: [facility] ENDOSCOPY;  Service: Endoscopy;  Laterality: N/A;  ? EYE SURGERY Bilateral 2015  ? cataract surgery, [place], [place]  ? KNEE ARTHROSCOPY    ? PULMONARY THROMBECTOMY N/A 03/10/2020  ? Procedure: PULMONARY THROMBECTOMY;  Surgeon: Algernon Huxley, MD;  Location: [facility] CV LAB;  Service: Cardiovascular;  Laterality: N/A;  ? TONSILLECTOMY    ? TOTAL KNEE ARTHROPLASTY Left 09/21/2019  ? Procedure: TOTAL KNEE ARTHROPLASTY;  Surgeon: Corky Mull, MD;  Location: [facility] ORS;  Service: Orthopedics;  Laterality: Left;  ? TUBAL LIGATION    ? ? ?SOCIAL HISTORY: ?Social History  ? ?Socioeconomic History  ? Marital status: Widowed  ?  Spouse name: Not on file  ? Number of children: Not on file  ? Years of education: Not on file  ? Highest education level: Not on file  ?Occupational History  ? Occupation: case Freight forwarder for [employer]  ?  Comment: RETIRED  ?Tobacco Use  ? Smoking status: Never  ? Smokeless tobacco: Never  ?Vaping Use  ? Vaping Use: Never used  ?Substance and Sexual Activity  ? Alcohol use: No  ? Drug use: No  ? Sexual activity: Not on file  ?Other Topics Concern  ? Not on file  ?Social History Narrative  ? Patient lives alone. Not sure if anyone will be able to stay with her after surgery.    ? She may look into going to a rehab facility if needed.  ? ?Social Determinants of Health  ? ?Financial Resource Strain: Not on file  ?Food Insecurity: Not on file  ?Transportation Needs: Not on file  ?Physical Activity: Not on file  ?Stress: Not on file  ?Social Connections: Not on file  ?Intimate Partner Violence: Not on file  ? ? ?FAMILY HISTORY: ?Family History  ?Problem Relation Age of Onset  ? Breast cancer Other 50  ? Hypertension Mother   ? Migraines Mother   ? Other Father   ? Lung cancer Sister   ? ? ?ALLERGIES:  is allergic to penicillins. ? ?MEDICATIONS:  ?Current Outpatient  Medications  ?Medication Sig Dispense Refill  ? abatacept (ORENCIA) 250 MG injection Inject into the vein. Once a month    ? allopurinol (ZYLOPRIM) 300 MG tablet Take 300 mg by mouth daily. In the morning    ? atomoxetine (STRATTERA) 60 MG capsule Take 60 mg by mouth every morning.    ? busPIRone (BUSPAR) 15 MG tablet Take 15 mg by mouth daily.    ? chlorthalidone (HYGROTON) 25 MG tablet Take 25 mg by mouth daily.    ? Cyanocobalamin (VITAMIN B-12) 5000 MCG TBDP Take 5,000 mcg by mouth daily.    ? cyclobenzaprine (FLEXERIL) 10 MG tablet Take 1 tablet (10 mg total) by mouth 2 (two) times daily as needed for muscle spasms. 20 tablet 0  ? diazepam (VALIUM) 10 MG tablet Take by mouth. Per pt taking '12mg'$  qd    ? ELIQUIS 5 MG TABS tablet TAKE ONE TABLET BY MOUTH TWICE A  DAY 180 tablet 1  ? fluticasone (FLONASE) 50 MCG/ACT nasal spray Place 2 sprays into both nostrils daily.    ? folic acid (FOLVITE) 262 MCG tablet Take 400 mcg by mouth daily.    ? gabapentin (NEURONTIN) 300 MG capsule Take 1 capsule by mouth 3 (three) times daily.    ? Lidocaine (HM LIDOCAINE PATCH) 4 % PTCH Apply 1 patch topically daily as needed. 12 patch 0  ? losartan (COZAAR) 25 MG tablet Take 25 mg by mouth daily.    ? methotrexate 250 MG/10ML injection     ? methotrexate 50 MG/2ML injection Inject 12.5 mg into the vein every Thursday. (0.5 ml)    ? omeprazole (PRILOSEC) 20 MG capsule Take 20 mg by mouth daily before breakfast.     ? oxybutynin (DITROPAN-XL) 10 MG 24 hr tablet Take 1 tablet (10 mg total) by mouth daily. 90 tablet 3  ? TUBERCULIN SYR 1CC/27GX1/2" 27G X 1/2" 1 ML MISC Use once a week for methotrexate injections.    ? atorvastatin (LIPITOR) 40 MG tablet Take 1 tablet (40 mg total) by mouth daily. 90 tablet 3  ? labetalol (NORMODYNE) 100 MG tablet Take 1 tablet (100 mg total) by mouth 2 (two) times daily. 180 tablet 3  ? ?No current facility-administered medications for this visit.  ? ? ?  ?. ? ?PHYSICAL EXAMINATION: ? ?Vitals:  ?  11/06/21 1324  ?BP: (!) 131/57  ?Pulse: 73  ?Temp: (!) 96.1 ?F (35.6 ?C)  ?SpO2: 100%  ? ?Filed Weights  ? 11/06/21 1324  ?Weight: 238 lb 6.4 oz (108.1 kg)  ? ? ?Physical Exam ?Vitals and nursing note reviewed.

## 2021-11-20 ENCOUNTER — Other Ambulatory Visit: Payer: Medicare Other

## 2021-11-20 ENCOUNTER — Ambulatory Visit: Payer: Medicare Other | Admitting: Internal Medicine

## 2021-12-02 NOTE — Progress Notes (Incomplete)
12/02/21 ?4:24 PM  ? ?Sabrina Green ?[birth year] ?[id number] ? ?Referring provider:  ?Sofie Hartigan, MD ?[address] ?[place],  [place] [address] ?No chief complaint on file. ? ? ?Urological history  ?OAB symptoms/ urge incontinence  ?- Managed on oxybutynin  ?- PVR *** ? ?HPI: ?Sabrina Green is a 76 y.o.female who returns today for a 3 month follow-up on OAB symptoms with a PVR and OAB questionnaire.  ? ? ? ? ? ?PMH: ?Past Medical History:  ?Diagnosis Date  ? Anemia   ? vitamin b and d deficiency  ? Anxiety   ? Arthritis   ? osteoarthrits  ? Chronic pain syndrome   ? CKD (chronic kidney disease) stage 3, GFR 30-59 ml/min (HCC)   ? Complication of anesthesia   ? difficulty waking up  ? DDD (degenerative disc disease), cervical   ? Fibromyalgia   ? not accurate, per patient  ? GERD (gastroesophageal reflux disease)   ? Gout   ? Hearing loss   ? History of kidney stones 1987  ? Hypertension   ? Pulmonary embolism ([place])   ? Rheumatoid arthritis ([place])   ? orencia infusions  ? Vitamin B12 deficiency   ? Vitamin D deficiency   ? ? ?Surgical History: ?Past Surgical History:  ?Procedure Laterality Date  ? ABDOMINAL HYSTERECTOMY    ? ABDOMINAL HYSTERECTOMY    ? ANKLE FRACTURE SURGERY Right 1989  ? [place], [place].   METAL IN ANKLE  ? BREAST BIOPSY Right 02/09/2019  ? affirm bx of calcs, ribbon marker neg  ? CESAREAN SECTION    ? CHOLECYSTECTOMY    ? COLONOSCOPY WITH PROPOFOL N/A 12/12/2020  ? Procedure: COLONOSCOPY WITH PROPOFOL;  Surgeon: Lesly Rubenstein, MD;  Location: [facility] ENDOSCOPY;  Service: Endoscopy;  Laterality: N/A;  ? ESOPHAGOGASTRODUODENOSCOPY N/A 12/12/2020  ? Procedure: ESOPHAGOGASTRODUODENOSCOPY (EGD);  Surgeon: Lesly Rubenstein, MD;  Location[facility] ENDOSCOPY;  Service: Endoscopy;  Laterality: N/A;  ? EYE SURGERY Bilateral 2015  ? cataract surgery, [place], [place]  ? KNEE ARTHROSCOPY    ? PULMONARY THROMBECTOMY N/A 03/10/2020  ? Procedure: PULMONARY THROMBECTOMY;  Surgeon: Algernon Huxley, MD;  Location: [place] CV LAB;  Service: Cardiovascular;  Laterality: N/A;  ? TONSILLECTOMY    ? TOTAL KNEE ARTHROPLASTY Left 09/21/2019  ? Procedure: TOTAL KNEE ARTHROPLASTY;  Surgeon: Corky Mull, MD;  Location: [facility] ORS;  Service: Orthopedics;  Laterality: Left;  ? TUBAL LIGATION    ? ? ?Home Medications:  ?Allergies as of 12/03/2021   ? ?   Reactions  ? Penicillins Hives  ? ?  ? ?  ?Medication List  ?  ? ?  ? Accurate as of Dec 02, 2021  4:24 PM. If you have any questions, ask your nurse or doctor.  ?  ?  ? ?  ? ?abatacept 250 MG injection ?Commonly known as: ORENCIA ?Inject into the vein. Once a month ?  ?allopurinol 300 MG tablet ?Commonly known as: ZYLOPRIM ?Take 300 mg by mouth daily. In the morning ?  ?atomoxetine 60 MG capsule ?Commonly known as: STRATTERA ?Take 60 mg by mouth every morning. ?  ?atorvastatin 40 MG tablet ?Commonly known as: LIPITOR ?Take 1 tablet (40 mg total) by mouth daily. ?  ?busPIRone 15 MG tablet ?Commonly known as: BUSPAR ?Take 15 mg by mouth daily. ?  ?chlorthalidone 25 MG tablet ?Commonly known as: HYGROTON ?Take 25 mg by mouth daily. ?  ?cyclobenzaprine 10 MG tablet ?Commonly known as:  FLEXERIL ?Take 1 tablet (10 mg total) by mouth 2 (two) times daily as needed for muscle spasms. ?  ?diazepam 10 MG tablet ?Commonly known as: VALIUM ?Take by mouth. Per pt taking '12mg'$  qd ?  ?Eliquis 5 MG Tabs tablet ?Generic drug: apixaban ?TAKE ONE TABLET BY MOUTH TWICE A DAY ?  ?fluticasone 50 MCG/ACT nasal spray ?Commonly known as: FLONASE ?Place 2 sprays into both nostrils daily. ?  ?folic acid 127 MCG tablet ?Commonly known as: FOLVITE ?Take 400 mcg by mouth daily. ?  ?gabapentin 300 MG capsule ?Commonly known as: NEURONTIN ?Take 1 capsule by mouth 3 (three) times daily. ?  ?labetalol 100 MG tablet ?Commonly known as: NORMODYNE ?Take 1 tablet (100 mg total) by mouth 2 (two) times daily. ?  ?lidocaine 4 % ?Commonly known as: HM Lidocaine Patch ?Apply 1 patch topically daily as  needed. ?  ?losartan 25 MG tablet ?Commonly known as: COZAAR ?Take 25 mg by mouth daily. ?  ?methotrexate 50 MG/2ML injection ?Inject 12.5 mg into the vein every Thursday. (0.5 ml) ?  ?methotrexate 250 MG/10ML injection ?  ?omeprazole 20 MG capsule ?Commonly known as: PRILOSEC ?Take 20 mg by mouth daily before breakfast. ?  ?oxybutynin 10 MG 24 hr tablet ?Commonly known as: DITROPAN-XL ?Take 1 tablet (10 mg total) by mouth daily. ?  ?TUBERCULIN SYR 1CC/27GX1/2" 27G X 1/2" 1 ML Misc ?Use once a week for methotrexate injections. ?  ?Vitamin B-12 5000 MCG Tbdp ?Take 5,000 mcg by mouth daily. ?  ? ?  ? ? ?Allergies:  ?Allergies  ?Allergen Reactions  ? Penicillins Hives  ? ? ?Family History: ?Family History  ?Problem Relation Age of Onset  ? Breast cancer Other 50  ? Hypertension Mother   ? Migraines Mother   ? Other Father   ? Lung cancer Sister   ? ? ?Social History:  reports that she has never smoked. She has never used smokeless tobacco. She reports that she does not drink alcohol and does not use drugs. ? ? ?Physical Exam: ?There were no vitals taken for this visit.  ?Constitutional:  Alert and oriented, No acute distress. ?HEENT: [place] AT, moist mucus membranes.  Trachea midline, no masses. ?Cardiovascular: No clubbing, cyanosis, or edema. ?Respiratory: Normal respiratory effort, no increased work of breathing. ?Skin: No rashes, bruises or suspicious lesions. ?Neurologic: Grossly intact, no focal deficits, moving all 4 extremities. ?Psychiatric: Normal mood and affect. ? ? ?Laboratory Data: ?Lab Results  ?Component Value Date  ? CREATININE 1.38 (H) 11/06/2021  ? ? Ref Range & Units 2 mo ago  ?Uric Acid 2.3 - 6.6 mg/dL 4.0   ?Resulting Agency  [facility]  ?Specimen Collected: 09/26/21 08:49 Last Resulted: 09/26/21 11:39  ?Received From: [facility]  Result Received: 10/05/21 09:43  ? ?Component ?    Latest Ref Rng 11/06/2021  ?Sodium ?    135 - 145 mmol/L 137   ?Potassium ?    3.5 - 5.1  mmol/L 3.7   ?Chloride ?    98 - 111 mmol/L 101   ?CO2 ?    22 - 32 mmol/L 24   ?Glucose ?    70 - 99 mg/dL 105 (H)   ?BUN ?    8 - 23 mg/dL 47 (H)   ?Creatinine ?    0.44 - 1.00 mg/dL 1.38 (H)   ?Calcium ?    8.9 - 10.3 mg/dL 9.4   ?Anion gap ?    5 - 15  12   ?Total Protein ?    6.5 - 8.1 g/dL 7.6   ?Albumin ?    3.5 - 5.0 g/dL 4.1   ?Total Bilirubin ?    0.3 - 1.2 mg/dL 0.9   ?Alkaline Phosphatase ?    38 - 126 U/L 87   ?AST ?    15 - 41 U/L 32   ?ALT ?    0 - 44 U/L 28   ?GFR, Estimated ?    >60 mL/min 40 (L)   ?  ?(H) High ?(L) Low ? ?Component ?    Latest Ref Rng 11/06/2021  ?WBC ?    4.0 - 10.5 K/uL 7.2   ?RBC ?    3.87 - 5.11 MIL/uL 4.03   ?Hemoglobin ?    12.0 - 15.0 g/dL 11.7 (L)   ?HCT ?    36.0 - 46.0 % 37.0   ?MCV ?    80.0 - 100.0 fL 91.8   ?MCH ?    26.0 - 34.0 pg 29.0   ?MCHC ?    30.0 - 36.0 g/dL 31.6   ?RDW ?    11.5 - 15.5 % 15.9 (H)   ?Platelets ?    150 - 400 K/uL 144 (L)   ?Neutrophils ?    % 58   ?NEUT# ?    1.7 - 7.7 K/uL 4.2   ?Lymphocytes ?    % 34   ?Lymphocyte # ?    0.7 - 4.0 K/uL 2.5   ?Monocytes Relative ?    % 5   ?Monocyte # ?    0.1 - 1.0 K/uL 0.4   ?Eosinophil ?    % 2   ?Eosinophils Absolute ?    0.0 - 0.5 K/uL 0.1   ?Basophil ?    % 1   ?Basophils Absolute ?    0.0 - 0.1 K/uL 0.0   ?nRBC ?    0.0 - 0.2 % 0.0   ?Immature Granulocytes ?    % 0   ?Abs Immature Granulocytes ?    0.00 - 0.07 K/uL 0.03   ?  ?(L) Low ?(H) High ? ?Urinalysis ? ? ?Pertinent Imaging: ? ? ?Assessment & Plan:   ? ? ?No follow-ups on file. ? ?[facility] ?[address] ?[place], [place] [address] ?[phone] ? ?I,Kailey Littlejohn,acting as a Education administrator for [employer], PA-C.,have documented all relevant documentation on the behalf of [place], PA-C,as directed by  [facility], PA-C while in the presence of SHANNON MCGOWAN, PA-C. ?

## 2021-12-03 ENCOUNTER — Ambulatory Visit: Payer: Medicare Other | Admitting: Urology

## 2022-01-20 IMAGING — CT CT CHEST W/O CM
2 of 3 series · 15 of 36 positions shown, 18 images · non-contrast
Comparison: Radiograph yesterday.  Chest CTA 03/09/2020

CLINICAL DATA: Recent pulmonary embolus status post
thrombectomy/tPA. Ground-glass opacities and concern for hemorrhage.

EXAM:
CT CHEST WITHOUT CONTRAST
TECHNIQUE: Multidetector CT imaging of the chest was performed following the
standard protocol without IV contrast.

[Series 2: thorax · axial · 0.71mm/px · z∈[-983,-753]mm · 12 of 136 slices shown, 15 images]
[im 11/136  mediastinal]
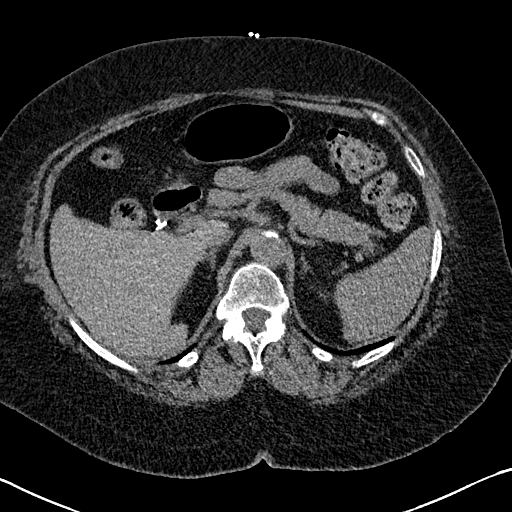
[im 11/136  lung]
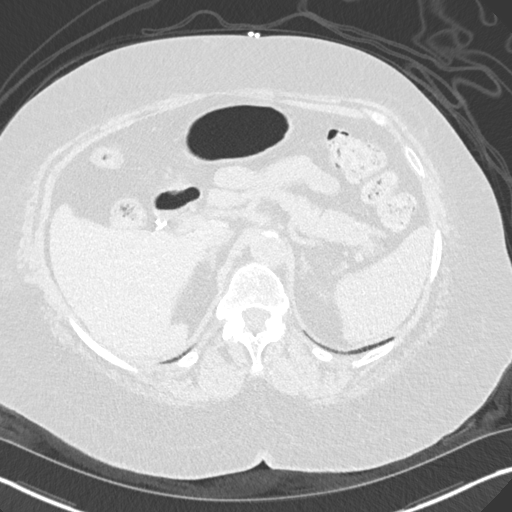
[im 21/136  lung]
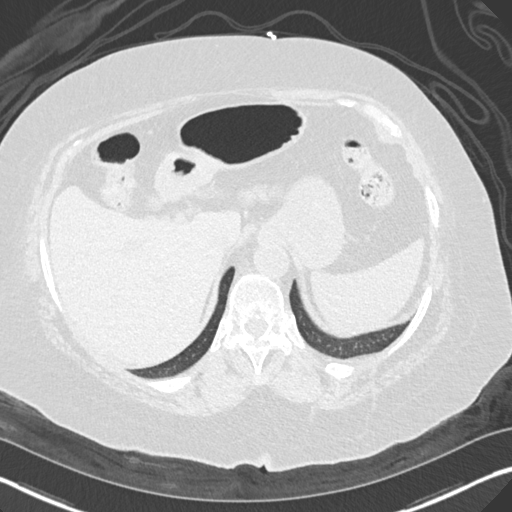
[im 31/136  lung]
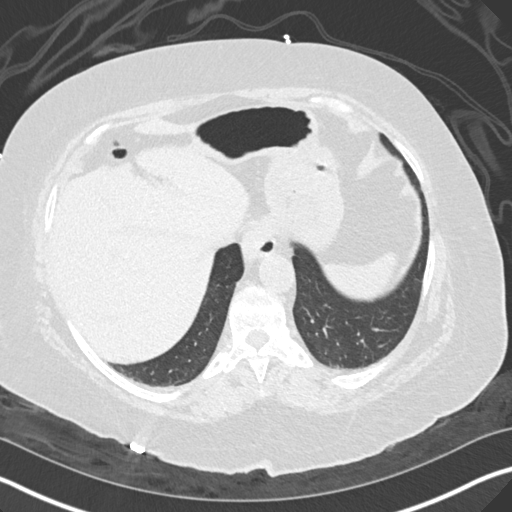
[im 41/136  lung]
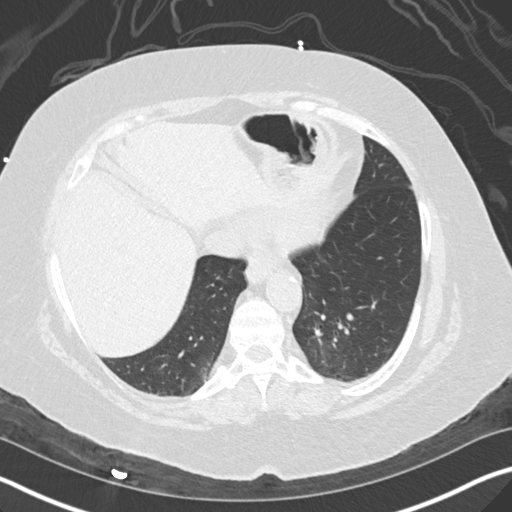
[im 51/136  mediastinal]
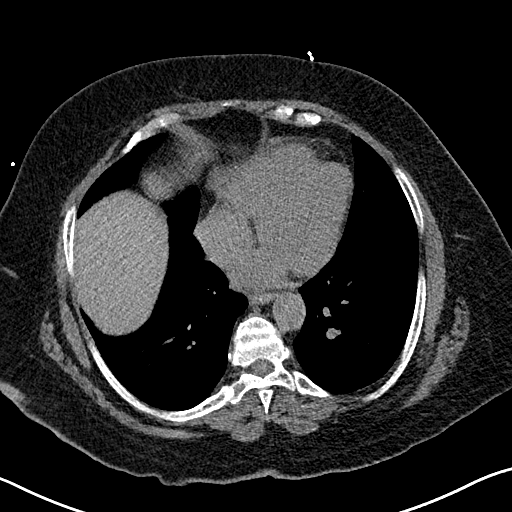
[im 51/136  lung]
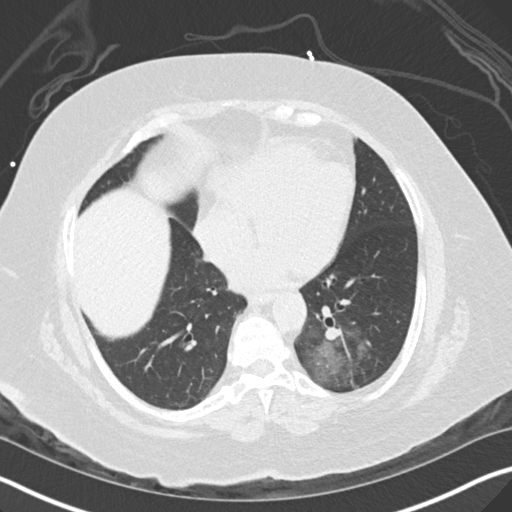
[im 61/136  lung]
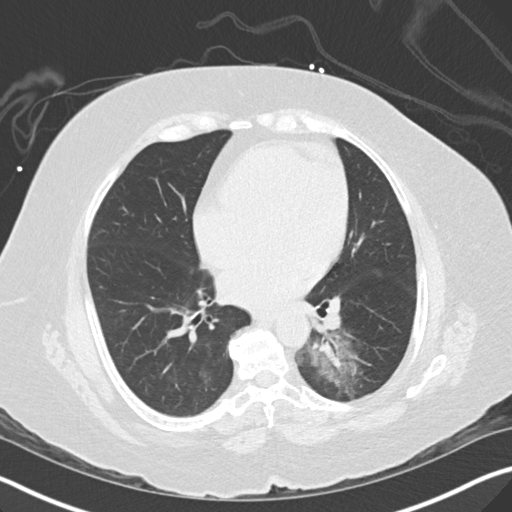
[im 76/136  lung]
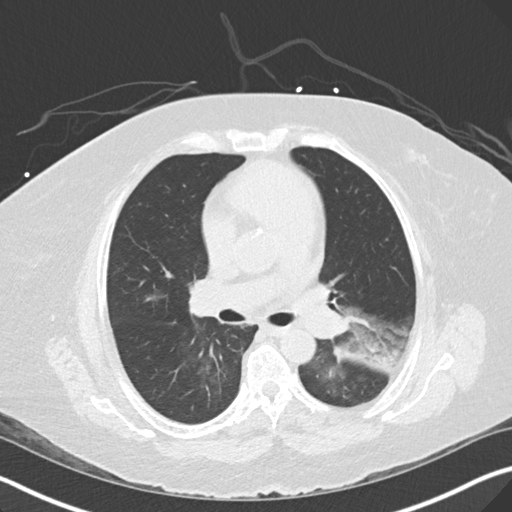
[im 86/136  lung]
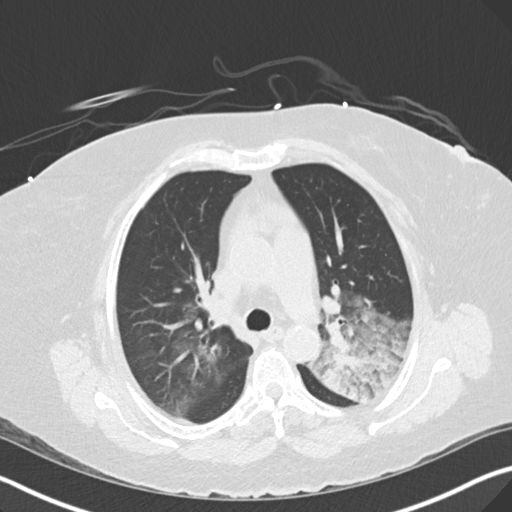
[im 96/136  mediastinal]
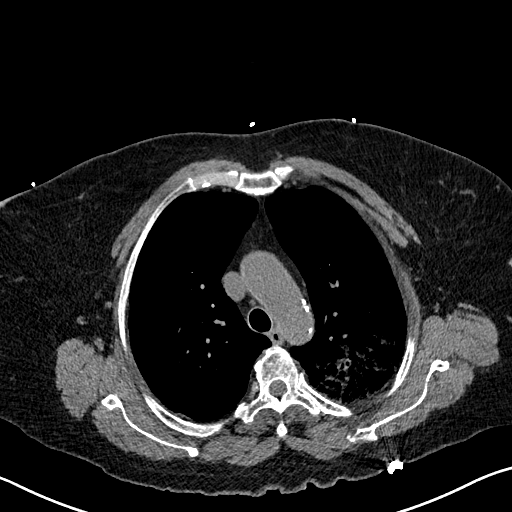
[im 96/136  lung]
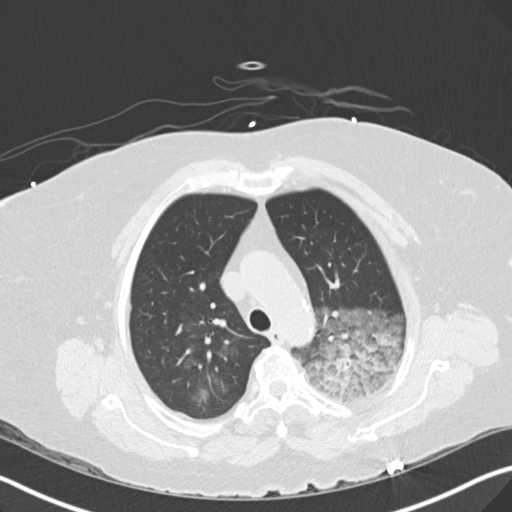
[im 106/136  lung]
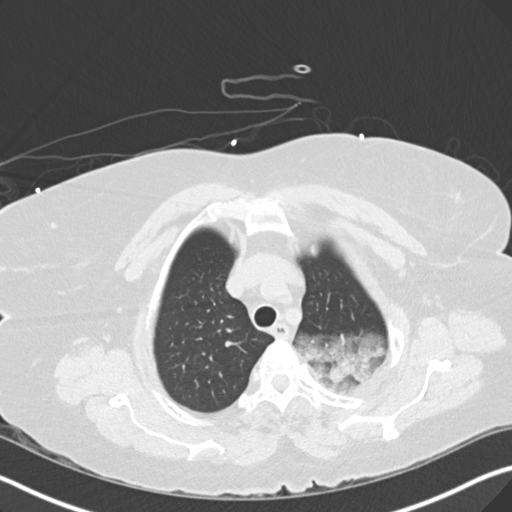
[im 116/136  lung]
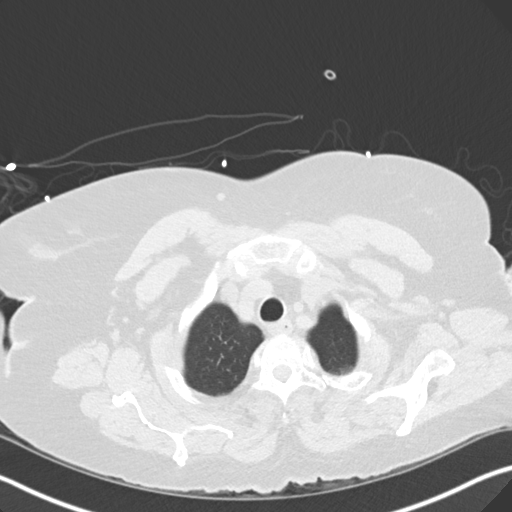
[im 126/136  lung]
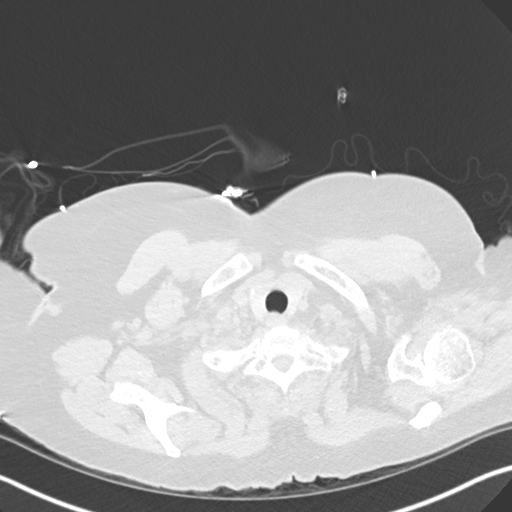

[Series 5: coronal · coronal · 0.59mm/px · 3 of 140 slices shown]
[im 28/140  lung]
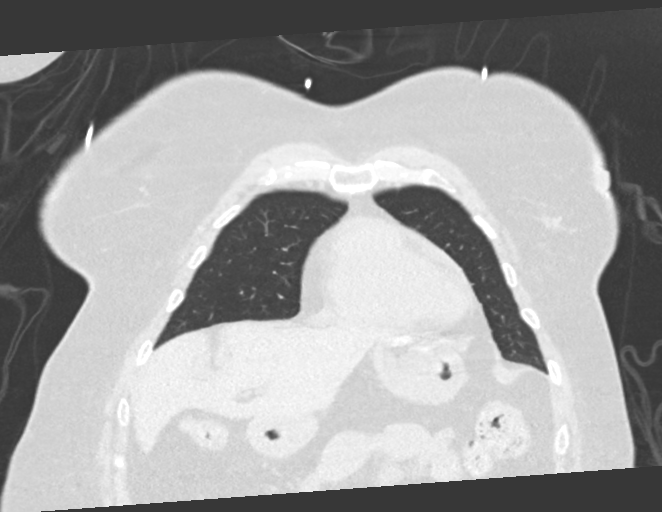
[im 56/140  lung]
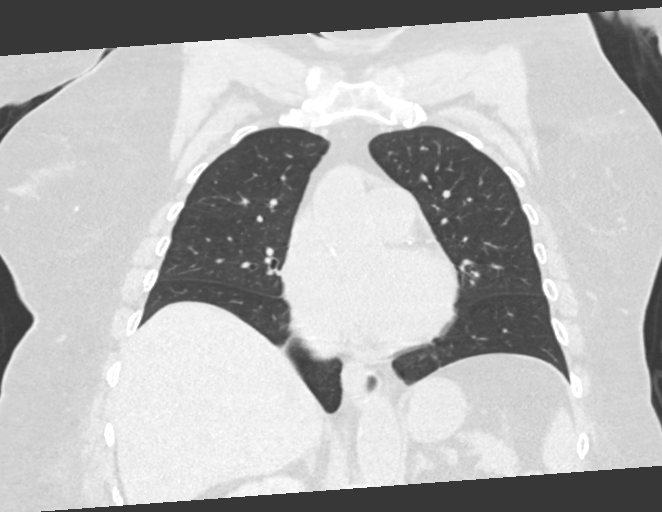
[im 84/140  lung]
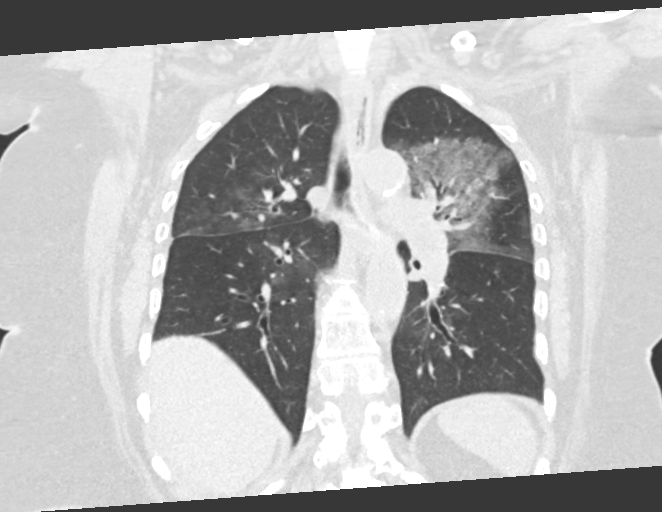

[15 of 36 positions shown; findings below may reference images not displayed]

FINDINGS: Cardiovascular: Aortic atherosclerosis. No aortic aneurysm. Coronary
artery calcifications/stent. No pericardial effusion.

Mediastinum/Nodes: No enlarged mediastinal lymph nodes. Limited
assessment for hilar adenopathy on noncontrast exam. Right thyroid
calcification without evidence of nodule. No esophageal wall
thickening. There is a tiny hiatal hernia.

Lungs/Pleura: Peribronchovascular ground-glass opacities in the
superior segment of both lower lobes, right middle lobe, as well as
throughout the dependent left greater than right upper lobe. Minimal
superimposed consolidation in the left upper lobe. Minimal
associated septal thickening in the left upper lobe. No pneumothorax
or pleural effusion. Trachea and central bronchi are patent.

Upper Abdomen: Cholecystectomy. There is excreted IV contrast in the
kidneys or renal collecting system, likely from prior thrombectomy.

Musculoskeletal: There are no acute or suspicious osseous
abnormalities.
IMPRESSION: 1. Ground-glass opacities within both lungs, more prominent on the
left involving the upper lobe. Favor pulmonary hemorrhage given
history of recent pulmonary tPA. Possibility of asymmetric edema is
also considered but felt less likely.
2. Coronary artery calcifications/stents.

Aortic Atherosclerosis (8Y55U-180.0).

## 2022-05-27 ENCOUNTER — Encounter (INDEPENDENT_AMBULATORY_CARE_PROVIDER_SITE_OTHER): Payer: Self-pay
